# Patient Record
Sex: Female | Born: 1935
Health system: Southern US, Community
[De-identification: ages and names within clinical notes are randomized; demographics above are authoritative.]

## PROBLEM LIST (undated history)

## (undated) DIAGNOSIS — Z9981 Dependence on supplemental oxygen: Secondary | ICD-10-CM

## (undated) DIAGNOSIS — G4730 Sleep apnea, unspecified: Secondary | ICD-10-CM

## (undated) DIAGNOSIS — C801 Malignant (primary) neoplasm, unspecified: Secondary | ICD-10-CM

## (undated) DIAGNOSIS — I4891 Unspecified atrial fibrillation: Secondary | ICD-10-CM

## (undated) DIAGNOSIS — R0602 Shortness of breath: Secondary | ICD-10-CM

## (undated) DIAGNOSIS — R51 Headache: Secondary | ICD-10-CM

## (undated) DIAGNOSIS — Z5189 Encounter for other specified aftercare: Secondary | ICD-10-CM

## (undated) DIAGNOSIS — D649 Anemia, unspecified: Secondary | ICD-10-CM

## (undated) DIAGNOSIS — E039 Hypothyroidism, unspecified: Secondary | ICD-10-CM

## (undated) DIAGNOSIS — K219 Gastro-esophageal reflux disease without esophagitis: Secondary | ICD-10-CM

## (undated) DIAGNOSIS — E119 Type 2 diabetes mellitus without complications: Secondary | ICD-10-CM

## (undated) DIAGNOSIS — E785 Hyperlipidemia, unspecified: Secondary | ICD-10-CM

## (undated) DIAGNOSIS — T7840XA Allergy, unspecified, initial encounter: Secondary | ICD-10-CM

## (undated) DIAGNOSIS — R0981 Nasal congestion: Secondary | ICD-10-CM

## (undated) DIAGNOSIS — F419 Anxiety disorder, unspecified: Secondary | ICD-10-CM

## (undated) DIAGNOSIS — I10 Essential (primary) hypertension: Secondary | ICD-10-CM

## (undated) HISTORY — PX: THYROID EXPLORATION: SHX5280

## (undated) HISTORY — DX: Encounter for other specified aftercare: Z51.89

## (undated) HISTORY — DX: Anemia, unspecified: D64.9

## (undated) HISTORY — DX: Allergy, unspecified, initial encounter: T78.40XA

## (undated) HISTORY — PX: CHOLECYSTECTOMY: SHX55

## (undated) HISTORY — PX: OVARIAN CYST SURGERY: SHX726

## (undated) HISTORY — PX: TUBAL LIGATION: SHX77

## (undated) HISTORY — DX: Unspecified atrial fibrillation: I48.91

## (undated) HISTORY — PX: APPENDECTOMY: SHX54

## (undated) HISTORY — DX: Hyperlipidemia, unspecified: E78.5

---

## 1974-03-10 DIAGNOSIS — C801 Malignant (primary) neoplasm, unspecified: Secondary | ICD-10-CM

## 1974-03-10 HISTORY — DX: Malignant (primary) neoplasm, unspecified: C80.1

## 1974-03-10 HISTORY — PX: THYROIDECTOMY: SHX17

## 2011-05-15 DIAGNOSIS — I10 Essential (primary) hypertension: Secondary | ICD-10-CM | POA: Diagnosis not present

## 2011-05-15 DIAGNOSIS — E782 Mixed hyperlipidemia: Secondary | ICD-10-CM | POA: Diagnosis not present

## 2011-05-15 DIAGNOSIS — E119 Type 2 diabetes mellitus without complications: Secondary | ICD-10-CM | POA: Diagnosis not present

## 2011-06-09 DIAGNOSIS — J449 Chronic obstructive pulmonary disease, unspecified: Secondary | ICD-10-CM | POA: Diagnosis not present

## 2011-06-09 DIAGNOSIS — F17200 Nicotine dependence, unspecified, uncomplicated: Secondary | ICD-10-CM | POA: Diagnosis not present

## 2011-06-09 DIAGNOSIS — G4733 Obstructive sleep apnea (adult) (pediatric): Secondary | ICD-10-CM | POA: Diagnosis not present

## 2011-06-09 DIAGNOSIS — J309 Allergic rhinitis, unspecified: Secondary | ICD-10-CM | POA: Diagnosis not present

## 2011-06-09 DIAGNOSIS — I10 Essential (primary) hypertension: Secondary | ICD-10-CM | POA: Diagnosis not present

## 2011-06-09 DIAGNOSIS — L2089 Other atopic dermatitis: Secondary | ICD-10-CM | POA: Diagnosis not present

## 2011-06-09 DIAGNOSIS — E039 Hypothyroidism, unspecified: Secondary | ICD-10-CM | POA: Diagnosis not present

## 2011-06-09 DIAGNOSIS — E119 Type 2 diabetes mellitus without complications: Secondary | ICD-10-CM | POA: Diagnosis not present

## 2011-10-08 DIAGNOSIS — E119 Type 2 diabetes mellitus without complications: Secondary | ICD-10-CM | POA: Diagnosis not present

## 2011-10-08 DIAGNOSIS — I10 Essential (primary) hypertension: Secondary | ICD-10-CM | POA: Diagnosis not present

## 2011-10-08 DIAGNOSIS — E785 Hyperlipidemia, unspecified: Secondary | ICD-10-CM | POA: Diagnosis not present

## 2011-10-15 DIAGNOSIS — J449 Chronic obstructive pulmonary disease, unspecified: Secondary | ICD-10-CM | POA: Diagnosis not present

## 2011-10-15 DIAGNOSIS — E782 Mixed hyperlipidemia: Secondary | ICD-10-CM | POA: Diagnosis not present

## 2011-10-15 DIAGNOSIS — E039 Hypothyroidism, unspecified: Secondary | ICD-10-CM | POA: Diagnosis not present

## 2011-10-15 DIAGNOSIS — IMO0002 Reserved for concepts with insufficient information to code with codable children: Secondary | ICD-10-CM | POA: Diagnosis not present

## 2011-10-15 DIAGNOSIS — M1990 Unspecified osteoarthritis, unspecified site: Secondary | ICD-10-CM | POA: Diagnosis not present

## 2011-10-15 DIAGNOSIS — E669 Obesity, unspecified: Secondary | ICD-10-CM | POA: Diagnosis not present

## 2011-10-15 DIAGNOSIS — J309 Allergic rhinitis, unspecified: Secondary | ICD-10-CM | POA: Diagnosis not present

## 2011-10-15 DIAGNOSIS — I10 Essential (primary) hypertension: Secondary | ICD-10-CM | POA: Diagnosis not present

## 2011-12-08 DIAGNOSIS — Z1231 Encounter for screening mammogram for malignant neoplasm of breast: Secondary | ICD-10-CM | POA: Diagnosis not present

## 2011-12-15 DIAGNOSIS — IMO0001 Reserved for inherently not codable concepts without codable children: Secondary | ICD-10-CM | POA: Diagnosis not present

## 2011-12-15 DIAGNOSIS — R197 Diarrhea, unspecified: Secondary | ICD-10-CM | POA: Diagnosis not present

## 2012-01-15 DIAGNOSIS — Z23 Encounter for immunization: Secondary | ICD-10-CM | POA: Diagnosis not present

## 2012-01-15 DIAGNOSIS — E7800 Pure hypercholesterolemia, unspecified: Secondary | ICD-10-CM | POA: Diagnosis not present

## 2012-01-15 DIAGNOSIS — I10 Essential (primary) hypertension: Secondary | ICD-10-CM | POA: Diagnosis not present

## 2012-01-15 DIAGNOSIS — IMO0001 Reserved for inherently not codable concepts without codable children: Secondary | ICD-10-CM | POA: Diagnosis not present

## 2012-01-15 DIAGNOSIS — E782 Mixed hyperlipidemia: Secondary | ICD-10-CM | POA: Diagnosis not present

## 2012-02-10 DIAGNOSIS — I10 Essential (primary) hypertension: Secondary | ICD-10-CM | POA: Diagnosis not present

## 2012-02-10 DIAGNOSIS — J309 Allergic rhinitis, unspecified: Secondary | ICD-10-CM | POA: Diagnosis not present

## 2012-02-10 DIAGNOSIS — E669 Obesity, unspecified: Secondary | ICD-10-CM | POA: Diagnosis not present

## 2012-02-10 DIAGNOSIS — E039 Hypothyroidism, unspecified: Secondary | ICD-10-CM | POA: Diagnosis not present

## 2012-02-10 DIAGNOSIS — M1990 Unspecified osteoarthritis, unspecified site: Secondary | ICD-10-CM | POA: Diagnosis not present

## 2012-02-10 DIAGNOSIS — E782 Mixed hyperlipidemia: Secondary | ICD-10-CM | POA: Diagnosis not present

## 2012-02-10 DIAGNOSIS — J449 Chronic obstructive pulmonary disease, unspecified: Secondary | ICD-10-CM | POA: Diagnosis not present

## 2012-02-10 DIAGNOSIS — IMO0002 Reserved for concepts with insufficient information to code with codable children: Secondary | ICD-10-CM | POA: Diagnosis not present

## 2012-05-12 DIAGNOSIS — E782 Mixed hyperlipidemia: Secondary | ICD-10-CM | POA: Diagnosis not present

## 2012-05-12 DIAGNOSIS — IMO0002 Reserved for concepts with insufficient information to code with codable children: Secondary | ICD-10-CM | POA: Diagnosis not present

## 2012-05-12 DIAGNOSIS — I10 Essential (primary) hypertension: Secondary | ICD-10-CM | POA: Diagnosis not present

## 2012-05-12 DIAGNOSIS — E119 Type 2 diabetes mellitus without complications: Secondary | ICD-10-CM | POA: Diagnosis not present

## 2012-07-05 DIAGNOSIS — R21 Rash and other nonspecific skin eruption: Secondary | ICD-10-CM | POA: Diagnosis not present

## 2012-07-06 DIAGNOSIS — E782 Mixed hyperlipidemia: Secondary | ICD-10-CM | POA: Diagnosis not present

## 2012-07-06 DIAGNOSIS — E039 Hypothyroidism, unspecified: Secondary | ICD-10-CM | POA: Diagnosis not present

## 2012-07-06 DIAGNOSIS — E119 Type 2 diabetes mellitus without complications: Secondary | ICD-10-CM | POA: Diagnosis not present

## 2012-07-12 DIAGNOSIS — E782 Mixed hyperlipidemia: Secondary | ICD-10-CM | POA: Diagnosis not present

## 2012-07-12 DIAGNOSIS — I10 Essential (primary) hypertension: Secondary | ICD-10-CM | POA: Diagnosis not present

## 2012-07-12 DIAGNOSIS — J309 Allergic rhinitis, unspecified: Secondary | ICD-10-CM | POA: Diagnosis not present

## 2012-07-12 DIAGNOSIS — Z0000 Encounter for general adult medical examination without abnormal findings: Secondary | ICD-10-CM | POA: Diagnosis not present

## 2012-07-12 DIAGNOSIS — J449 Chronic obstructive pulmonary disease, unspecified: Secondary | ICD-10-CM | POA: Diagnosis not present

## 2012-07-12 DIAGNOSIS — E039 Hypothyroidism, unspecified: Secondary | ICD-10-CM | POA: Diagnosis not present

## 2012-07-12 DIAGNOSIS — IMO0002 Reserved for concepts with insufficient information to code with codable children: Secondary | ICD-10-CM | POA: Diagnosis not present

## 2012-07-12 DIAGNOSIS — M1990 Unspecified osteoarthritis, unspecified site: Secondary | ICD-10-CM | POA: Diagnosis not present

## 2012-07-30 DIAGNOSIS — R21 Rash and other nonspecific skin eruption: Secondary | ICD-10-CM | POA: Diagnosis not present

## 2012-07-30 DIAGNOSIS — T148 Other injury of unspecified body region: Secondary | ICD-10-CM | POA: Diagnosis not present

## 2012-08-14 DIAGNOSIS — M5430 Sciatica, unspecified side: Secondary | ICD-10-CM | POA: Diagnosis not present

## 2012-08-19 DIAGNOSIS — L408 Other psoriasis: Secondary | ICD-10-CM | POA: Diagnosis not present

## 2012-08-19 DIAGNOSIS — R21 Rash and other nonspecific skin eruption: Secondary | ICD-10-CM | POA: Diagnosis not present

## 2012-08-19 DIAGNOSIS — L259 Unspecified contact dermatitis, unspecified cause: Secondary | ICD-10-CM | POA: Diagnosis not present

## 2012-08-27 DIAGNOSIS — IMO0002 Reserved for concepts with insufficient information to code with codable children: Secondary | ICD-10-CM | POA: Diagnosis not present

## 2012-08-27 DIAGNOSIS — R21 Rash and other nonspecific skin eruption: Secondary | ICD-10-CM | POA: Diagnosis not present

## 2012-09-01 DIAGNOSIS — IMO0002 Reserved for concepts with insufficient information to code with codable children: Secondary | ICD-10-CM | POA: Diagnosis not present

## 2012-09-01 DIAGNOSIS — R21 Rash and other nonspecific skin eruption: Secondary | ICD-10-CM | POA: Diagnosis not present

## 2012-11-23 DIAGNOSIS — E782 Mixed hyperlipidemia: Secondary | ICD-10-CM | POA: Diagnosis not present

## 2012-11-23 DIAGNOSIS — E119 Type 2 diabetes mellitus without complications: Secondary | ICD-10-CM | POA: Diagnosis not present

## 2012-11-23 DIAGNOSIS — F17200 Nicotine dependence, unspecified, uncomplicated: Secondary | ICD-10-CM | POA: Diagnosis not present

## 2012-11-23 DIAGNOSIS — E039 Hypothyroidism, unspecified: Secondary | ICD-10-CM | POA: Diagnosis not present

## 2012-11-23 DIAGNOSIS — E669 Obesity, unspecified: Secondary | ICD-10-CM | POA: Diagnosis not present

## 2012-11-23 DIAGNOSIS — I10 Essential (primary) hypertension: Secondary | ICD-10-CM | POA: Diagnosis not present

## 2012-11-23 DIAGNOSIS — M1990 Unspecified osteoarthritis, unspecified site: Secondary | ICD-10-CM | POA: Diagnosis not present

## 2012-11-29 DIAGNOSIS — E039 Hypothyroidism, unspecified: Secondary | ICD-10-CM | POA: Diagnosis not present

## 2012-11-29 DIAGNOSIS — E669 Obesity, unspecified: Secondary | ICD-10-CM | POA: Diagnosis not present

## 2012-11-29 DIAGNOSIS — I10 Essential (primary) hypertension: Secondary | ICD-10-CM | POA: Diagnosis not present

## 2012-11-29 DIAGNOSIS — J309 Allergic rhinitis, unspecified: Secondary | ICD-10-CM | POA: Diagnosis not present

## 2012-11-29 DIAGNOSIS — Z23 Encounter for immunization: Secondary | ICD-10-CM | POA: Diagnosis not present

## 2012-11-29 DIAGNOSIS — J449 Chronic obstructive pulmonary disease, unspecified: Secondary | ICD-10-CM | POA: Diagnosis not present

## 2012-11-29 DIAGNOSIS — IMO0002 Reserved for concepts with insufficient information to code with codable children: Secondary | ICD-10-CM | POA: Diagnosis not present

## 2012-11-29 DIAGNOSIS — E782 Mixed hyperlipidemia: Secondary | ICD-10-CM | POA: Diagnosis not present

## 2012-12-01 DIAGNOSIS — D485 Neoplasm of uncertain behavior of skin: Secondary | ICD-10-CM | POA: Diagnosis not present

## 2012-12-01 DIAGNOSIS — L570 Actinic keratosis: Secondary | ICD-10-CM | POA: Diagnosis not present

## 2012-12-01 DIAGNOSIS — L259 Unspecified contact dermatitis, unspecified cause: Secondary | ICD-10-CM | POA: Diagnosis not present

## 2012-12-04 DIAGNOSIS — Z87891 Personal history of nicotine dependence: Secondary | ICD-10-CM | POA: Diagnosis not present

## 2012-12-04 DIAGNOSIS — E119 Type 2 diabetes mellitus without complications: Secondary | ICD-10-CM | POA: Diagnosis not present

## 2012-12-04 DIAGNOSIS — Z7982 Long term (current) use of aspirin: Secondary | ICD-10-CM | POA: Diagnosis not present

## 2012-12-04 DIAGNOSIS — R5381 Other malaise: Secondary | ICD-10-CM | POA: Diagnosis not present

## 2012-12-04 DIAGNOSIS — Z79899 Other long term (current) drug therapy: Secondary | ICD-10-CM | POA: Diagnosis not present

## 2012-12-04 DIAGNOSIS — Z8249 Family history of ischemic heart disease and other diseases of the circulatory system: Secondary | ICD-10-CM | POA: Diagnosis not present

## 2012-12-04 DIAGNOSIS — R42 Dizziness and giddiness: Secondary | ICD-10-CM | POA: Diagnosis not present

## 2012-12-04 DIAGNOSIS — I10 Essential (primary) hypertension: Secondary | ICD-10-CM | POA: Diagnosis not present

## 2012-12-06 DIAGNOSIS — H81399 Other peripheral vertigo, unspecified ear: Secondary | ICD-10-CM | POA: Diagnosis not present

## 2012-12-06 DIAGNOSIS — R42 Dizziness and giddiness: Secondary | ICD-10-CM | POA: Diagnosis not present

## 2012-12-15 DIAGNOSIS — L259 Unspecified contact dermatitis, unspecified cause: Secondary | ICD-10-CM | POA: Diagnosis not present

## 2012-12-15 DIAGNOSIS — L570 Actinic keratosis: Secondary | ICD-10-CM | POA: Diagnosis not present

## 2012-12-17 DIAGNOSIS — E119 Type 2 diabetes mellitus without complications: Secondary | ICD-10-CM | POA: Diagnosis not present

## 2012-12-20 DIAGNOSIS — IMO0001 Reserved for inherently not codable concepts without codable children: Secondary | ICD-10-CM | POA: Diagnosis not present

## 2012-12-20 DIAGNOSIS — M6281 Muscle weakness (generalized): Secondary | ICD-10-CM | POA: Diagnosis not present

## 2012-12-20 DIAGNOSIS — R269 Unspecified abnormalities of gait and mobility: Secondary | ICD-10-CM | POA: Diagnosis not present

## 2012-12-20 DIAGNOSIS — R42 Dizziness and giddiness: Secondary | ICD-10-CM | POA: Diagnosis not present

## 2012-12-22 DIAGNOSIS — Z1231 Encounter for screening mammogram for malignant neoplasm of breast: Secondary | ICD-10-CM | POA: Diagnosis not present

## 2012-12-22 DIAGNOSIS — IMO0001 Reserved for inherently not codable concepts without codable children: Secondary | ICD-10-CM | POA: Diagnosis not present

## 2012-12-22 DIAGNOSIS — M6281 Muscle weakness (generalized): Secondary | ICD-10-CM | POA: Diagnosis not present

## 2012-12-22 DIAGNOSIS — R269 Unspecified abnormalities of gait and mobility: Secondary | ICD-10-CM | POA: Diagnosis not present

## 2012-12-22 DIAGNOSIS — R42 Dizziness and giddiness: Secondary | ICD-10-CM | POA: Diagnosis not present

## 2012-12-24 DIAGNOSIS — M6281 Muscle weakness (generalized): Secondary | ICD-10-CM | POA: Diagnosis not present

## 2012-12-24 DIAGNOSIS — IMO0001 Reserved for inherently not codable concepts without codable children: Secondary | ICD-10-CM | POA: Diagnosis not present

## 2012-12-24 DIAGNOSIS — R269 Unspecified abnormalities of gait and mobility: Secondary | ICD-10-CM | POA: Diagnosis not present

## 2012-12-24 DIAGNOSIS — R42 Dizziness and giddiness: Secondary | ICD-10-CM | POA: Diagnosis not present

## 2012-12-28 DIAGNOSIS — R42 Dizziness and giddiness: Secondary | ICD-10-CM | POA: Diagnosis not present

## 2012-12-28 DIAGNOSIS — R269 Unspecified abnormalities of gait and mobility: Secondary | ICD-10-CM | POA: Diagnosis not present

## 2012-12-28 DIAGNOSIS — IMO0001 Reserved for inherently not codable concepts without codable children: Secondary | ICD-10-CM | POA: Diagnosis not present

## 2012-12-28 DIAGNOSIS — M6281 Muscle weakness (generalized): Secondary | ICD-10-CM | POA: Diagnosis not present

## 2012-12-30 DIAGNOSIS — R269 Unspecified abnormalities of gait and mobility: Secondary | ICD-10-CM | POA: Diagnosis not present

## 2012-12-30 DIAGNOSIS — IMO0001 Reserved for inherently not codable concepts without codable children: Secondary | ICD-10-CM | POA: Diagnosis not present

## 2012-12-30 DIAGNOSIS — M6281 Muscle weakness (generalized): Secondary | ICD-10-CM | POA: Diagnosis not present

## 2012-12-30 DIAGNOSIS — R42 Dizziness and giddiness: Secondary | ICD-10-CM | POA: Diagnosis not present

## 2013-01-04 DIAGNOSIS — IMO0001 Reserved for inherently not codable concepts without codable children: Secondary | ICD-10-CM | POA: Diagnosis not present

## 2013-01-04 DIAGNOSIS — M6281 Muscle weakness (generalized): Secondary | ICD-10-CM | POA: Diagnosis not present

## 2013-01-04 DIAGNOSIS — R42 Dizziness and giddiness: Secondary | ICD-10-CM | POA: Diagnosis not present

## 2013-01-04 DIAGNOSIS — R269 Unspecified abnormalities of gait and mobility: Secondary | ICD-10-CM | POA: Diagnosis not present

## 2013-01-06 DIAGNOSIS — IMO0001 Reserved for inherently not codable concepts without codable children: Secondary | ICD-10-CM | POA: Diagnosis not present

## 2013-01-06 DIAGNOSIS — M6281 Muscle weakness (generalized): Secondary | ICD-10-CM | POA: Diagnosis not present

## 2013-01-06 DIAGNOSIS — R42 Dizziness and giddiness: Secondary | ICD-10-CM | POA: Diagnosis not present

## 2013-01-06 DIAGNOSIS — R269 Unspecified abnormalities of gait and mobility: Secondary | ICD-10-CM | POA: Diagnosis not present

## 2013-01-13 DIAGNOSIS — M6281 Muscle weakness (generalized): Secondary | ICD-10-CM | POA: Diagnosis not present

## 2013-01-13 DIAGNOSIS — R269 Unspecified abnormalities of gait and mobility: Secondary | ICD-10-CM | POA: Diagnosis not present

## 2013-01-13 DIAGNOSIS — IMO0001 Reserved for inherently not codable concepts without codable children: Secondary | ICD-10-CM | POA: Diagnosis not present

## 2013-01-13 DIAGNOSIS — R42 Dizziness and giddiness: Secondary | ICD-10-CM | POA: Diagnosis not present

## 2013-02-17 DIAGNOSIS — M899 Disorder of bone, unspecified: Secondary | ICD-10-CM | POA: Diagnosis not present

## 2013-02-17 DIAGNOSIS — Z79899 Other long term (current) drug therapy: Secondary | ICD-10-CM | POA: Diagnosis not present

## 2013-02-17 DIAGNOSIS — Z780 Asymptomatic menopausal state: Secondary | ICD-10-CM | POA: Diagnosis not present

## 2013-02-17 DIAGNOSIS — Z85850 Personal history of malignant neoplasm of thyroid: Secondary | ICD-10-CM | POA: Diagnosis not present

## 2013-02-17 DIAGNOSIS — Z7982 Long term (current) use of aspirin: Secondary | ICD-10-CM | POA: Diagnosis not present

## 2013-02-17 DIAGNOSIS — Z8262 Family history of osteoporosis: Secondary | ICD-10-CM | POA: Diagnosis not present

## 2013-02-17 DIAGNOSIS — M810 Age-related osteoporosis without current pathological fracture: Secondary | ICD-10-CM | POA: Diagnosis not present

## 2013-02-21 DIAGNOSIS — I10 Essential (primary) hypertension: Secondary | ICD-10-CM | POA: Diagnosis not present

## 2013-02-21 DIAGNOSIS — F17200 Nicotine dependence, unspecified, uncomplicated: Secondary | ICD-10-CM | POA: Diagnosis not present

## 2013-02-21 DIAGNOSIS — N183 Chronic kidney disease, stage 3 unspecified: Secondary | ICD-10-CM | POA: Diagnosis not present

## 2013-02-21 DIAGNOSIS — E669 Obesity, unspecified: Secondary | ICD-10-CM | POA: Diagnosis not present

## 2013-02-21 DIAGNOSIS — E119 Type 2 diabetes mellitus without complications: Secondary | ICD-10-CM | POA: Diagnosis not present

## 2013-02-21 DIAGNOSIS — E782 Mixed hyperlipidemia: Secondary | ICD-10-CM | POA: Diagnosis not present

## 2013-02-21 DIAGNOSIS — E039 Hypothyroidism, unspecified: Secondary | ICD-10-CM | POA: Diagnosis not present

## 2013-02-28 DIAGNOSIS — Z1331 Encounter for screening for depression: Secondary | ICD-10-CM | POA: Diagnosis not present

## 2013-02-28 DIAGNOSIS — J309 Allergic rhinitis, unspecified: Secondary | ICD-10-CM | POA: Diagnosis not present

## 2013-02-28 DIAGNOSIS — E039 Hypothyroidism, unspecified: Secondary | ICD-10-CM | POA: Diagnosis not present

## 2013-02-28 DIAGNOSIS — J449 Chronic obstructive pulmonary disease, unspecified: Secondary | ICD-10-CM | POA: Diagnosis not present

## 2013-02-28 DIAGNOSIS — IMO0002 Reserved for concepts with insufficient information to code with codable children: Secondary | ICD-10-CM | POA: Diagnosis not present

## 2013-02-28 DIAGNOSIS — E669 Obesity, unspecified: Secondary | ICD-10-CM | POA: Diagnosis not present

## 2013-02-28 DIAGNOSIS — E782 Mixed hyperlipidemia: Secondary | ICD-10-CM | POA: Diagnosis not present

## 2013-02-28 DIAGNOSIS — I10 Essential (primary) hypertension: Secondary | ICD-10-CM | POA: Diagnosis not present

## 2013-04-05 DIAGNOSIS — E039 Hypothyroidism, unspecified: Secondary | ICD-10-CM | POA: Diagnosis present

## 2013-04-05 DIAGNOSIS — J9600 Acute respiratory failure, unspecified whether with hypoxia or hypercapnia: Secondary | ICD-10-CM | POA: Diagnosis not present

## 2013-04-05 DIAGNOSIS — Z5189 Encounter for other specified aftercare: Secondary | ICD-10-CM | POA: Diagnosis not present

## 2013-04-05 DIAGNOSIS — F4489 Other dissociative and conversion disorders: Secondary | ICD-10-CM | POA: Diagnosis not present

## 2013-04-05 DIAGNOSIS — R948 Abnormal results of function studies of other organs and systems: Secondary | ICD-10-CM | POA: Diagnosis present

## 2013-04-05 DIAGNOSIS — Z79899 Other long term (current) drug therapy: Secondary | ICD-10-CM | POA: Diagnosis not present

## 2013-04-05 DIAGNOSIS — R269 Unspecified abnormalities of gait and mobility: Secondary | ICD-10-CM | POA: Diagnosis not present

## 2013-04-05 DIAGNOSIS — L408 Other psoriasis: Secondary | ICD-10-CM | POA: Diagnosis present

## 2013-04-05 DIAGNOSIS — I700 Atherosclerosis of aorta: Secondary | ICD-10-CM | POA: Diagnosis not present

## 2013-04-05 DIAGNOSIS — R911 Solitary pulmonary nodule: Secondary | ICD-10-CM | POA: Diagnosis present

## 2013-04-05 DIAGNOSIS — Z87891 Personal history of nicotine dependence: Secondary | ICD-10-CM | POA: Diagnosis not present

## 2013-04-05 DIAGNOSIS — R0902 Hypoxemia: Secondary | ICD-10-CM | POA: Diagnosis not present

## 2013-04-05 DIAGNOSIS — R293 Abnormal posture: Secondary | ICD-10-CM | POA: Diagnosis not present

## 2013-04-05 DIAGNOSIS — R799 Abnormal finding of blood chemistry, unspecified: Secondary | ICD-10-CM | POA: Diagnosis present

## 2013-04-05 DIAGNOSIS — R0989 Other specified symptoms and signs involving the circulatory and respiratory systems: Secondary | ICD-10-CM | POA: Diagnosis not present

## 2013-04-05 DIAGNOSIS — E041 Nontoxic single thyroid nodule: Secondary | ICD-10-CM | POA: Diagnosis present

## 2013-04-05 DIAGNOSIS — Z7982 Long term (current) use of aspirin: Secondary | ICD-10-CM | POA: Diagnosis not present

## 2013-04-05 DIAGNOSIS — I6789 Other cerebrovascular disease: Secondary | ICD-10-CM | POA: Diagnosis not present

## 2013-04-05 DIAGNOSIS — D72829 Elevated white blood cell count, unspecified: Secondary | ICD-10-CM | POA: Diagnosis not present

## 2013-04-05 DIAGNOSIS — E119 Type 2 diabetes mellitus without complications: Secondary | ICD-10-CM | POA: Diagnosis present

## 2013-04-05 DIAGNOSIS — D649 Anemia, unspecified: Secondary | ICD-10-CM | POA: Diagnosis not present

## 2013-04-05 DIAGNOSIS — R4182 Altered mental status, unspecified: Secondary | ICD-10-CM | POA: Diagnosis not present

## 2013-04-05 DIAGNOSIS — E871 Hypo-osmolality and hyponatremia: Secondary | ICD-10-CM | POA: Diagnosis not present

## 2013-04-05 DIAGNOSIS — A4151 Sepsis due to Escherichia coli [E. coli]: Secondary | ICD-10-CM | POA: Diagnosis not present

## 2013-04-05 DIAGNOSIS — G4733 Obstructive sleep apnea (adult) (pediatric): Secondary | ICD-10-CM | POA: Diagnosis present

## 2013-04-05 DIAGNOSIS — R6520 Severe sepsis without septic shock: Secondary | ICD-10-CM | POA: Diagnosis present

## 2013-04-05 DIAGNOSIS — Z6831 Body mass index (BMI) 31.0-31.9, adult: Secondary | ICD-10-CM | POA: Diagnosis not present

## 2013-04-05 DIAGNOSIS — J449 Chronic obstructive pulmonary disease, unspecified: Secondary | ICD-10-CM | POA: Diagnosis present

## 2013-04-05 DIAGNOSIS — N390 Urinary tract infection, site not specified: Secondary | ICD-10-CM | POA: Diagnosis not present

## 2013-04-05 DIAGNOSIS — I10 Essential (primary) hypertension: Secondary | ICD-10-CM | POA: Diagnosis present

## 2013-04-05 DIAGNOSIS — E876 Hypokalemia: Secondary | ICD-10-CM | POA: Diagnosis present

## 2013-04-05 DIAGNOSIS — M6281 Muscle weakness (generalized): Secondary | ICD-10-CM | POA: Diagnosis not present

## 2013-04-05 DIAGNOSIS — R279 Unspecified lack of coordination: Secondary | ICD-10-CM | POA: Diagnosis not present

## 2013-04-05 DIAGNOSIS — A419 Sepsis, unspecified organism: Secondary | ICD-10-CM | POA: Diagnosis not present

## 2013-04-05 DIAGNOSIS — N10 Acute tubulo-interstitial nephritis: Secondary | ICD-10-CM | POA: Diagnosis not present

## 2013-04-05 DIAGNOSIS — R110 Nausea: Secondary | ICD-10-CM | POA: Diagnosis not present

## 2013-04-05 DIAGNOSIS — N179 Acute kidney failure, unspecified: Secondary | ICD-10-CM | POA: Diagnosis present

## 2013-04-11 DIAGNOSIS — A419 Sepsis, unspecified organism: Secondary | ICD-10-CM | POA: Diagnosis not present

## 2013-04-11 DIAGNOSIS — M6281 Muscle weakness (generalized): Secondary | ICD-10-CM | POA: Diagnosis not present

## 2013-04-11 DIAGNOSIS — E039 Hypothyroidism, unspecified: Secondary | ICD-10-CM | POA: Diagnosis not present

## 2013-04-11 DIAGNOSIS — R293 Abnormal posture: Secondary | ICD-10-CM | POA: Diagnosis not present

## 2013-04-11 DIAGNOSIS — G4733 Obstructive sleep apnea (adult) (pediatric): Secondary | ICD-10-CM | POA: Diagnosis not present

## 2013-04-11 DIAGNOSIS — R269 Unspecified abnormalities of gait and mobility: Secondary | ICD-10-CM | POA: Diagnosis not present

## 2013-04-11 DIAGNOSIS — R4182 Altered mental status, unspecified: Secondary | ICD-10-CM | POA: Diagnosis not present

## 2013-04-11 DIAGNOSIS — Z5189 Encounter for other specified aftercare: Secondary | ICD-10-CM | POA: Diagnosis not present

## 2013-04-11 DIAGNOSIS — J449 Chronic obstructive pulmonary disease, unspecified: Secondary | ICD-10-CM | POA: Diagnosis not present

## 2013-04-11 DIAGNOSIS — I10 Essential (primary) hypertension: Secondary | ICD-10-CM | POA: Diagnosis not present

## 2013-04-11 DIAGNOSIS — N10 Acute tubulo-interstitial nephritis: Secondary | ICD-10-CM | POA: Diagnosis not present

## 2013-04-11 DIAGNOSIS — E119 Type 2 diabetes mellitus without complications: Secondary | ICD-10-CM | POA: Diagnosis not present

## 2013-04-11 DIAGNOSIS — J9600 Acute respiratory failure, unspecified whether with hypoxia or hypercapnia: Secondary | ICD-10-CM | POA: Diagnosis not present

## 2013-04-11 DIAGNOSIS — D72829 Elevated white blood cell count, unspecified: Secondary | ICD-10-CM | POA: Diagnosis not present

## 2013-04-11 DIAGNOSIS — R279 Unspecified lack of coordination: Secondary | ICD-10-CM | POA: Diagnosis not present

## 2013-04-11 DIAGNOSIS — A4151 Sepsis due to Escherichia coli [E. coli]: Secondary | ICD-10-CM | POA: Diagnosis not present

## 2013-04-20 DIAGNOSIS — G4733 Obstructive sleep apnea (adult) (pediatric): Secondary | ICD-10-CM | POA: Diagnosis not present

## 2013-05-12 DIAGNOSIS — E042 Nontoxic multinodular goiter: Secondary | ICD-10-CM | POA: Diagnosis not present

## 2013-05-25 DIAGNOSIS — J449 Chronic obstructive pulmonary disease, unspecified: Secondary | ICD-10-CM | POA: Diagnosis not present

## 2013-05-25 DIAGNOSIS — E669 Obesity, unspecified: Secondary | ICD-10-CM | POA: Diagnosis not present

## 2013-05-25 DIAGNOSIS — I10 Essential (primary) hypertension: Secondary | ICD-10-CM | POA: Diagnosis not present

## 2013-05-25 DIAGNOSIS — IMO0002 Reserved for concepts with insufficient information to code with codable children: Secondary | ICD-10-CM | POA: Diagnosis not present

## 2013-05-25 DIAGNOSIS — J309 Allergic rhinitis, unspecified: Secondary | ICD-10-CM | POA: Diagnosis not present

## 2013-05-25 DIAGNOSIS — E039 Hypothyroidism, unspecified: Secondary | ICD-10-CM | POA: Diagnosis not present

## 2013-05-25 DIAGNOSIS — A419 Sepsis, unspecified organism: Secondary | ICD-10-CM | POA: Diagnosis not present

## 2013-05-25 DIAGNOSIS — E782 Mixed hyperlipidemia: Secondary | ICD-10-CM | POA: Diagnosis not present

## 2013-07-04 DIAGNOSIS — E782 Mixed hyperlipidemia: Secondary | ICD-10-CM | POA: Diagnosis not present

## 2013-07-04 DIAGNOSIS — E119 Type 2 diabetes mellitus without complications: Secondary | ICD-10-CM | POA: Diagnosis not present

## 2013-07-04 DIAGNOSIS — E039 Hypothyroidism, unspecified: Secondary | ICD-10-CM | POA: Diagnosis not present

## 2013-07-11 DIAGNOSIS — IMO0002 Reserved for concepts with insufficient information to code with codable children: Secondary | ICD-10-CM | POA: Diagnosis not present

## 2013-07-11 DIAGNOSIS — H6120 Impacted cerumen, unspecified ear: Secondary | ICD-10-CM | POA: Diagnosis not present

## 2013-07-11 DIAGNOSIS — I10 Essential (primary) hypertension: Secondary | ICD-10-CM | POA: Diagnosis not present

## 2013-07-11 DIAGNOSIS — J449 Chronic obstructive pulmonary disease, unspecified: Secondary | ICD-10-CM | POA: Diagnosis not present

## 2013-07-11 DIAGNOSIS — E669 Obesity, unspecified: Secondary | ICD-10-CM | POA: Diagnosis not present

## 2013-07-11 DIAGNOSIS — J309 Allergic rhinitis, unspecified: Secondary | ICD-10-CM | POA: Diagnosis not present

## 2013-07-11 DIAGNOSIS — E782 Mixed hyperlipidemia: Secondary | ICD-10-CM | POA: Diagnosis not present

## 2013-07-11 DIAGNOSIS — E039 Hypothyroidism, unspecified: Secondary | ICD-10-CM | POA: Diagnosis not present

## 2013-10-24 DIAGNOSIS — S6990XA Unspecified injury of unspecified wrist, hand and finger(s), initial encounter: Secondary | ICD-10-CM | POA: Diagnosis not present

## 2013-10-24 DIAGNOSIS — M79609 Pain in unspecified limb: Secondary | ICD-10-CM | POA: Diagnosis not present

## 2013-10-24 DIAGNOSIS — S9306XA Dislocation of unspecified ankle joint, initial encounter: Secondary | ICD-10-CM | POA: Diagnosis not present

## 2013-10-24 DIAGNOSIS — S82409A Unspecified fracture of shaft of unspecified fibula, initial encounter for closed fracture: Secondary | ICD-10-CM | POA: Diagnosis not present

## 2013-10-24 DIAGNOSIS — S82899A Other fracture of unspecified lower leg, initial encounter for closed fracture: Secondary | ICD-10-CM | POA: Diagnosis not present

## 2013-10-24 DIAGNOSIS — Z23 Encounter for immunization: Secondary | ICD-10-CM | POA: Diagnosis not present

## 2013-10-24 DIAGNOSIS — S82209A Unspecified fracture of shaft of unspecified tibia, initial encounter for closed fracture: Secondary | ICD-10-CM | POA: Diagnosis not present

## 2013-10-24 DIAGNOSIS — S93409A Sprain of unspecified ligament of unspecified ankle, initial encounter: Secondary | ICD-10-CM | POA: Diagnosis not present

## 2013-10-24 DIAGNOSIS — S8253XA Displaced fracture of medial malleolus of unspecified tibia, initial encounter for closed fracture: Secondary | ICD-10-CM | POA: Diagnosis not present

## 2013-10-24 DIAGNOSIS — S82853A Displaced trimalleolar fracture of unspecified lower leg, initial encounter for closed fracture: Secondary | ICD-10-CM | POA: Diagnosis not present

## 2013-10-24 DIAGNOSIS — M7989 Other specified soft tissue disorders: Secondary | ICD-10-CM | POA: Diagnosis not present

## 2013-10-25 DIAGNOSIS — S9306XA Dislocation of unspecified ankle joint, initial encounter: Secondary | ICD-10-CM | POA: Diagnosis not present

## 2013-10-25 DIAGNOSIS — S82899A Other fracture of unspecified lower leg, initial encounter for closed fracture: Secondary | ICD-10-CM | POA: Diagnosis not present

## 2013-10-31 ENCOUNTER — Other Ambulatory Visit: Payer: Self-pay | Admitting: Orthopedic Surgery

## 2013-10-31 ENCOUNTER — Ambulatory Visit
Admission: RE | Admit: 2013-10-31 | Discharge: 2013-10-31 | Disposition: A | Discharge status: Home / Self Care | Payer: Medicare Other | Source: Ambulatory Visit | Attending: Orthopedic Surgery | Admitting: Orthopedic Surgery

## 2013-10-31 DIAGNOSIS — S8263XA Displaced fracture of lateral malleolus of unspecified fibula, initial encounter for closed fracture: Secondary | ICD-10-CM | POA: Diagnosis not present

## 2013-10-31 DIAGNOSIS — S82871A Displaced pilon fracture of right tibia, initial encounter for closed fracture: Secondary | ICD-10-CM

## 2013-10-31 DIAGNOSIS — S82853A Displaced trimalleolar fracture of unspecified lower leg, initial encounter for closed fracture: Secondary | ICD-10-CM | POA: Diagnosis not present

## 2013-10-31 DIAGNOSIS — S8253XA Displaced fracture of medial malleolus of unspecified tibia, initial encounter for closed fracture: Secondary | ICD-10-CM | POA: Diagnosis not present

## 2013-11-01 ENCOUNTER — Encounter (HOSPITAL_COMMUNITY): Payer: Self-pay

## 2013-11-02 ENCOUNTER — Encounter (HOSPITAL_COMMUNITY)
Admission: RE | Admit: 2013-11-02 | Discharge: 2013-11-02 | Disposition: A | Discharge status: Home / Self Care | Payer: Medicare Other | Source: Ambulatory Visit | Attending: Anesthesiology | Admitting: Anesthesiology

## 2013-11-02 ENCOUNTER — Encounter (HOSPITAL_COMMUNITY)
Admission: RE | Admit: 2013-11-02 | Discharge: 2013-11-02 | Disposition: A | Discharge status: Home / Self Care | Payer: Medicare Other | Source: Ambulatory Visit | Attending: Orthopedic Surgery | Admitting: Orthopedic Surgery

## 2013-11-02 ENCOUNTER — Encounter (HOSPITAL_COMMUNITY): Payer: Self-pay

## 2013-11-02 ENCOUNTER — Other Ambulatory Visit: Payer: Self-pay | Admitting: Physician Assistant

## 2013-11-02 DIAGNOSIS — G4730 Sleep apnea, unspecified: Secondary | ICD-10-CM | POA: Diagnosis present

## 2013-11-02 DIAGNOSIS — S82839B Other fracture of upper and lower end of unspecified fibula, initial encounter for open fracture type I or II: Secondary | ICD-10-CM | POA: Diagnosis not present

## 2013-11-02 DIAGNOSIS — E119 Type 2 diabetes mellitus without complications: Secondary | ICD-10-CM | POA: Diagnosis present

## 2013-11-02 DIAGNOSIS — G8918 Other acute postprocedural pain: Secondary | ICD-10-CM | POA: Diagnosis not present

## 2013-11-02 DIAGNOSIS — M25559 Pain in unspecified hip: Secondary | ICD-10-CM | POA: Diagnosis not present

## 2013-11-02 DIAGNOSIS — J9819 Other pulmonary collapse: Secondary | ICD-10-CM | POA: Diagnosis not present

## 2013-11-02 DIAGNOSIS — R51 Headache: Secondary | ICD-10-CM | POA: Diagnosis not present

## 2013-11-02 DIAGNOSIS — M25579 Pain in unspecified ankle and joints of unspecified foot: Secondary | ICD-10-CM | POA: Diagnosis not present

## 2013-11-02 DIAGNOSIS — IMO0002 Reserved for concepts with insufficient information to code with codable children: Secondary | ICD-10-CM | POA: Diagnosis not present

## 2013-11-02 DIAGNOSIS — S82209A Unspecified fracture of shaft of unspecified tibia, initial encounter for closed fracture: Secondary | ICD-10-CM | POA: Diagnosis not present

## 2013-11-02 DIAGNOSIS — Z85850 Personal history of malignant neoplasm of thyroid: Secondary | ICD-10-CM | POA: Diagnosis not present

## 2013-11-02 DIAGNOSIS — W19XXXA Unspecified fall, initial encounter: Secondary | ICD-10-CM | POA: Diagnosis present

## 2013-11-02 DIAGNOSIS — Z9181 History of falling: Secondary | ICD-10-CM | POA: Diagnosis not present

## 2013-11-02 DIAGNOSIS — R269 Unspecified abnormalities of gait and mobility: Secondary | ICD-10-CM | POA: Diagnosis not present

## 2013-11-02 DIAGNOSIS — Z87891 Personal history of nicotine dependence: Secondary | ICD-10-CM | POA: Diagnosis not present

## 2013-11-02 DIAGNOSIS — E039 Hypothyroidism, unspecified: Secondary | ICD-10-CM | POA: Diagnosis present

## 2013-11-02 DIAGNOSIS — Z23 Encounter for immunization: Secondary | ICD-10-CM | POA: Diagnosis not present

## 2013-11-02 DIAGNOSIS — S82899A Other fracture of unspecified lower leg, initial encounter for closed fracture: Secondary | ICD-10-CM | POA: Diagnosis not present

## 2013-11-02 DIAGNOSIS — F411 Generalized anxiety disorder: Secondary | ICD-10-CM | POA: Diagnosis present

## 2013-11-02 DIAGNOSIS — M6281 Muscle weakness (generalized): Secondary | ICD-10-CM | POA: Diagnosis not present

## 2013-11-02 DIAGNOSIS — I10 Essential (primary) hypertension: Secondary | ICD-10-CM | POA: Diagnosis present

## 2013-11-02 DIAGNOSIS — K219 Gastro-esophageal reflux disease without esophagitis: Secondary | ICD-10-CM | POA: Diagnosis not present

## 2013-11-02 DIAGNOSIS — S8290XD Unspecified fracture of unspecified lower leg, subsequent encounter for closed fracture with routine healing: Secondary | ICD-10-CM | POA: Diagnosis not present

## 2013-11-02 DIAGNOSIS — Z01818 Encounter for other preprocedural examination: Secondary | ICD-10-CM | POA: Diagnosis not present

## 2013-11-02 DIAGNOSIS — S8253XB Displaced fracture of medial malleolus of unspecified tibia, initial encounter for open fracture type I or II: Secondary | ICD-10-CM | POA: Diagnosis not present

## 2013-11-02 HISTORY — DX: Essential (primary) hypertension: I10

## 2013-11-02 HISTORY — DX: Type 2 diabetes mellitus without complications: E11.9

## 2013-11-02 HISTORY — DX: Hypothyroidism, unspecified: E03.9

## 2013-11-02 HISTORY — DX: Gastro-esophageal reflux disease without esophagitis: K21.9

## 2013-11-02 HISTORY — DX: Anxiety disorder, unspecified: F41.9

## 2013-11-02 HISTORY — DX: Sleep apnea, unspecified: G47.30

## 2013-11-02 HISTORY — DX: Malignant (primary) neoplasm, unspecified: C80.1

## 2013-11-02 HISTORY — DX: Shortness of breath: R06.02

## 2013-11-02 HISTORY — DX: Headache: R51

## 2013-11-02 HISTORY — DX: Nasal congestion: R09.81

## 2013-11-02 LAB — CBC
HCT: 40.1 % (ref 36.0–46.0)
Hemoglobin: 13.6 g/dL (ref 12.0–15.0)
MCH: 31.1 pg (ref 26.0–34.0)
MCHC: 33.9 g/dL (ref 30.0–36.0)
MCV: 91.8 fL (ref 78.0–100.0)
Platelets: 375 10*3/uL (ref 150–400)
RBC: 4.37 MIL/uL (ref 3.87–5.11)
RDW: 13.3 % (ref 11.5–15.5)
WBC: 9.5 10*3/uL (ref 4.0–10.5)

## 2013-11-02 LAB — BASIC METABOLIC PANEL
Anion gap: 15 (ref 5–15)
BUN: 14 mg/dL (ref 6–23)
CO2: 26 mEq/L (ref 19–32)
Calcium: 10.5 mg/dL (ref 8.4–10.5)
Chloride: 99 mEq/L (ref 96–112)
Creatinine, Ser: 0.86 mg/dL (ref 0.50–1.10)
GFR calc Af Amer: 74 mL/min — ABNORMAL LOW (ref >90)
GFR calc non Af Amer: 64 mL/min — ABNORMAL LOW (ref >90)
Glucose, Bld: 101 mg/dL — ABNORMAL HIGH (ref 70–99)
Potassium: 3.7 mEq/L (ref 3.7–5.3)
Sodium: 140 mEq/L (ref 137–147)

## 2013-11-02 MED ORDER — ACETAMINOPHEN 500 MG PO TABS
1000.0000 mg | ORAL_TABLET | Freq: Once | ORAL | Status: AC
  Administered 2013-11-03: 1000 mg via ORAL
  Filled 2013-11-02: qty 2

## 2013-11-02 MED ORDER — SODIUM CHLORIDE 0.9 % IV SOLN: INTRAVENOUS | Status: DC

## 2013-11-02 MED ORDER — CEFAZOLIN SODIUM-DEXTROSE 2-3 GM-% IV SOLR
2.0000 g | INTRAVENOUS | Status: AC
  Administered 2013-11-03: 2 g via INTRAVENOUS
  Filled 2013-11-02: qty 50

## 2013-11-02 MED ORDER — CHLORHEXIDINE GLUCONATE 4 % EX LIQD: 60.0000 mL | Freq: Once | CUTANEOUS | Status: DC

## 2013-11-02 NOTE — Pre-Procedure Instructions (Signed)
Latriece Anstine  11/02/2013   Your procedure is scheduled on:  Thursday August 27 th at 1300 PM  Report to [facility] Admitting at 1100 AM.  Call this number if you have problems the morning of surgery: [phone]   Remember:   Do not eat food or drink liquids after midnight Wednesday   Take these medicines the morning of surgery with A SIP OF WATER:Levothyroxine(Synthroid) and Metoprolol(Toprol)   Do not wear jewelry, make-up or nail polish.  Do not wear lotions, powders, or perfumes. You may wear deodorant.  Do not shave 48 hours prior to surgery.   Do not bring valuables to the hospital.  [facility] is not responsible  for any belongings or valuables.               Contacts, dentures or bridgework may not be worn into surgery.  Leave suitcase in the car. After surgery it may be brought to your room.  For patients admitted to the hospital, discharge time is determined by your  treatment team.               Patients discharged the day of surgery will not be allowed to drive home.   Special Instructions: [facility] - Preparing for Surgery  Before surgery, you can play an important role.  Because skin is not sterile, your skin needs to be as free of germs as possible.  You can reduce the number of germs on you skin by washing with CHG (chlorahexidine gluconate) soap before surgery.  CHG is an antiseptic cleaner which kills germs and bonds with the skin to continue killing germs even after washing.  Please DO NOT use if you have an allergy to CHG or antibacterial soaps.  If your skin becomes reddened/irritated stop using the CHG and inform your nurse when you arrive at Short Stay.  Do not shave (including legs and underarms) for at least 48 hours prior to the first CHG shower.  You may shave your face.  Please follow these instructions carefully:   1.  Shower with CHG Soap the night before surgery and the                                morning of Surgery.  2.  If you  choose to wash your hair, wash your hair first as usual with your       normal shampoo.  3.  After you shampoo, rinse your hair and body thoroughly to remove the                      Shampoo.  4.  Use CHG as you would any other liquid soap.  You can apply chg directly       to the skin and wash gently with scrungie or a clean washcloth.  5.  Apply the CHG Soap to your body ONLY FROM THE NECK DOWN.        Do not use on open wounds or open sores.  Avoid contact with your eyes,       ears, mouth and genitals (private parts).  Wash genitals (private parts)       with your normal soap.  6.  Wash thoroughly, paying special attention to the area where your surgery        will be performed.  7.  Thoroughly rinse your body with warm water from the neck  down.  8.  DO NOT shower/wash with your normal soap after using and rinsing off       the CHG Soap.  9.  Pat yourself dry with a clean towel.            10.  Wear clean pajamas.            11.  Place clean sheets on your bed the night of your first shower and do not        sleep with pets.  Day of Surgery  Do not apply any lotions/deoderants the morning of surgery.  Please wear clean clothes to the hospital/surgery center.      Please read over the following fact sheets that you were given: Pain Booklet, Coughing and Deep Breathing and Surgical Site Infection Prevention

## 2013-11-03 ENCOUNTER — Encounter (HOSPITAL_COMMUNITY): Payer: Medicare Other | Admitting: Anesthesiology

## 2013-11-03 ENCOUNTER — Inpatient Hospital Stay (HOSPITAL_COMMUNITY): Payer: Medicare Other | Admitting: Anesthesiology

## 2013-11-03 ENCOUNTER — Encounter (HOSPITAL_COMMUNITY): Payer: Self-pay | Admitting: *Deleted

## 2013-11-03 ENCOUNTER — Encounter (HOSPITAL_COMMUNITY)
Admission: RE | Disposition: A | Discharge status: Skilled Nursing Facility | Payer: Self-pay | Source: Ambulatory Visit | Attending: Orthopedic Surgery

## 2013-11-03 ENCOUNTER — Inpatient Hospital Stay (HOSPITAL_COMMUNITY)
Admission: RE | Admit: 2013-11-03 | Discharge: 2013-11-07 | DRG: 494 | Disposition: A | Discharge status: Skilled Nursing Facility | Payer: Medicare Other | Source: Ambulatory Visit | Attending: Orthopedic Surgery | Admitting: Orthopedic Surgery

## 2013-11-03 DIAGNOSIS — S82899A Other fracture of unspecified lower leg, initial encounter for closed fracture: Secondary | ICD-10-CM | POA: Diagnosis not present

## 2013-11-03 DIAGNOSIS — S82209A Unspecified fracture of shaft of unspecified tibia, initial encounter for closed fracture: Principal | ICD-10-CM | POA: Diagnosis present

## 2013-11-03 DIAGNOSIS — Z85850 Personal history of malignant neoplasm of thyroid: Secondary | ICD-10-CM | POA: Diagnosis not present

## 2013-11-03 DIAGNOSIS — R269 Unspecified abnormalities of gait and mobility: Secondary | ICD-10-CM | POA: Diagnosis not present

## 2013-11-03 DIAGNOSIS — G8918 Other acute postprocedural pain: Secondary | ICD-10-CM | POA: Diagnosis not present

## 2013-11-03 DIAGNOSIS — S8253XB Displaced fracture of medial malleolus of unspecified tibia, initial encounter for open fracture type I or II: Secondary | ICD-10-CM | POA: Diagnosis not present

## 2013-11-03 DIAGNOSIS — E039 Hypothyroidism, unspecified: Secondary | ICD-10-CM | POA: Diagnosis not present

## 2013-11-03 DIAGNOSIS — S82409A Unspecified fracture of shaft of unspecified fibula, initial encounter for closed fracture: Secondary | ICD-10-CM | POA: Diagnosis present

## 2013-11-03 DIAGNOSIS — Z23 Encounter for immunization: Secondary | ICD-10-CM

## 2013-11-03 DIAGNOSIS — G4730 Sleep apnea, unspecified: Secondary | ICD-10-CM | POA: Diagnosis present

## 2013-11-03 DIAGNOSIS — Z87891 Personal history of nicotine dependence: Secondary | ICD-10-CM | POA: Diagnosis not present

## 2013-11-03 DIAGNOSIS — I10 Essential (primary) hypertension: Secondary | ICD-10-CM | POA: Diagnosis present

## 2013-11-03 DIAGNOSIS — IMO0002 Reserved for concepts with insufficient information to code with codable children: Secondary | ICD-10-CM | POA: Diagnosis not present

## 2013-11-03 DIAGNOSIS — R51 Headache: Secondary | ICD-10-CM | POA: Diagnosis not present

## 2013-11-03 DIAGNOSIS — S82839B Other fracture of upper and lower end of unspecified fibula, initial encounter for open fracture type I or II: Secondary | ICD-10-CM | POA: Diagnosis not present

## 2013-11-03 DIAGNOSIS — W19XXXA Unspecified fall, initial encounter: Secondary | ICD-10-CM | POA: Diagnosis present

## 2013-11-03 DIAGNOSIS — S8290XD Unspecified fracture of unspecified lower leg, subsequent encounter for closed fracture with routine healing: Secondary | ICD-10-CM | POA: Diagnosis not present

## 2013-11-03 DIAGNOSIS — F411 Generalized anxiety disorder: Secondary | ICD-10-CM | POA: Diagnosis present

## 2013-11-03 DIAGNOSIS — M25559 Pain in unspecified hip: Secondary | ICD-10-CM | POA: Diagnosis not present

## 2013-11-03 DIAGNOSIS — S82891A Other fracture of right lower leg, initial encounter for closed fracture: Secondary | ICD-10-CM | POA: Diagnosis present

## 2013-11-03 DIAGNOSIS — Z9181 History of falling: Secondary | ICD-10-CM | POA: Diagnosis not present

## 2013-11-03 DIAGNOSIS — E119 Type 2 diabetes mellitus without complications: Secondary | ICD-10-CM | POA: Diagnosis not present

## 2013-11-03 DIAGNOSIS — K219 Gastro-esophageal reflux disease without esophagitis: Secondary | ICD-10-CM | POA: Diagnosis not present

## 2013-11-03 DIAGNOSIS — M6281 Muscle weakness (generalized): Secondary | ICD-10-CM | POA: Diagnosis not present

## 2013-11-03 DIAGNOSIS — M25579 Pain in unspecified ankle and joints of unspecified foot: Secondary | ICD-10-CM | POA: Diagnosis present

## 2013-11-03 HISTORY — PX: OPEN REDUCTION INTERNAL FIXATION (ORIF) TIBIA/FIBULA FRACTURE: SHX5992

## 2013-11-03 LAB — GLUCOSE, CAPILLARY
Glucose-Capillary: 103 mg/dL — ABNORMAL HIGH (ref 70–99)
Glucose-Capillary: 105 mg/dL — ABNORMAL HIGH (ref 70–99)
Glucose-Capillary: 118 mg/dL — ABNORMAL HIGH (ref 70–99)
Glucose-Capillary: 143 mg/dL — ABNORMAL HIGH (ref 70–99)

## 2013-11-03 SURGERY — OPEN REDUCTION INTERNAL FIXATION (ORIF) TIBIA/FIBULA FRACTURE
Anesthesia: General | Laterality: Right

## 2013-11-03 MED ORDER — PROPOFOL 10 MG/ML IV BOLUS
INTRAVENOUS | Status: DC | PRN
  Administered 2013-11-03: 150 mg via INTRAVENOUS

## 2013-11-03 MED ORDER — LACTATED RINGERS IV SOLN
INTRAVENOUS | Status: DC
  Administered 2013-11-03: 12:00:00 via INTRAVENOUS

## 2013-11-03 MED ORDER — BUPIVACAINE-EPINEPHRINE (PF) 0.5% -1:200000 IJ SOLN
INTRAMUSCULAR | Status: DC | PRN
  Administered 2013-11-03: 30 mL via PERINEURAL

## 2013-11-03 MED ORDER — MIDAZOLAM HCL 5 MG/5ML IJ SOLN
INTRAMUSCULAR | Status: DC | PRN
  Administered 2013-11-03: 2 mg via INTRAVENOUS

## 2013-11-03 MED ORDER — SUCCINYLCHOLINE CHLORIDE 20 MG/ML IJ SOLN
INTRAMUSCULAR | Status: AC
  Filled 2013-11-03: qty 1

## 2013-11-03 MED ORDER — LOSARTAN POTASSIUM-HCTZ 100-12.5 MG PO TABS: 1.0000 | ORAL_TABLET | Freq: Every day | ORAL | Status: DC

## 2013-11-03 MED ORDER — DOCUSATE SODIUM 100 MG PO CAPS
100.0000 mg | ORAL_CAPSULE | Freq: Two times a day (BID) | ORAL | Status: DC
  Administered 2013-11-03 – 2013-11-07 (×8): 100 mg via ORAL
  Filled 2013-11-03 (×9): qty 1

## 2013-11-03 MED ORDER — ONDANSETRON HCL 4 MG PO TABS: 4.0000 mg | ORAL_TABLET | Freq: Four times a day (QID) | ORAL | Status: DC | PRN

## 2013-11-03 MED ORDER — METOPROLOL SUCCINATE ER 50 MG PO TB24
50.0000 mg | ORAL_TABLET | Freq: Every day | ORAL | Status: DC
  Administered 2013-11-04 – 2013-11-07 (×4): 50 mg via ORAL
  Filled 2013-11-03 (×4): qty 1

## 2013-11-03 MED ORDER — METOCLOPRAMIDE HCL 10 MG PO TABS: 5.0000 mg | ORAL_TABLET | Freq: Three times a day (TID) | ORAL | Status: DC | PRN

## 2013-11-03 MED ORDER — ZOLPIDEM TARTRATE 5 MG PO TABS: 5.0000 mg | ORAL_TABLET | Freq: Every evening | ORAL | Status: DC | PRN

## 2013-11-03 MED ORDER — LIDOCAINE HCL (CARDIAC) 20 MG/ML IV SOLN
INTRAVENOUS | Status: DC | PRN
  Administered 2013-11-03: 40 mg via INTRAVENOUS

## 2013-11-03 MED ORDER — ONDANSETRON HCL 4 MG/2ML IJ SOLN
INTRAMUSCULAR | Status: DC | PRN
  Administered 2013-11-03: 4 mg via INTRAVENOUS

## 2013-11-03 MED ORDER — 0.9 % SODIUM CHLORIDE (POUR BTL) OPTIME
TOPICAL | Status: DC | PRN
  Administered 2013-11-03: 1000 mL

## 2013-11-03 MED ORDER — DEXAMETHASONE SODIUM PHOSPHATE 4 MG/ML IJ SOLN
INTRAMUSCULAR | Status: AC
  Filled 2013-11-03: qty 1

## 2013-11-03 MED ORDER — FENTANYL CITRATE 0.05 MG/ML IJ SOLN
INTRAMUSCULAR | Status: AC
  Filled 2013-11-03: qty 2

## 2013-11-03 MED ORDER — SCOPOLAMINE 1 MG/3DAYS TD PT72
1.0000 | MEDICATED_PATCH | TRANSDERMAL | Status: DC
  Administered 2013-11-03: 1 via TRANSDERMAL

## 2013-11-03 MED ORDER — SIMVASTATIN 20 MG PO TABS
20.0000 mg | ORAL_TABLET | Freq: Every day | ORAL | Status: DC
  Administered 2013-11-03 – 2013-11-07 (×5): 20 mg via ORAL
  Filled 2013-11-03 (×5): qty 1

## 2013-11-03 MED ORDER — LOSARTAN POTASSIUM 50 MG PO TABS
100.0000 mg | ORAL_TABLET | Freq: Every day | ORAL | Status: DC
  Administered 2013-11-03 – 2013-11-07 (×5): 100 mg via ORAL
  Filled 2013-11-03 (×5): qty 2

## 2013-11-03 MED ORDER — ENOXAPARIN SODIUM 40 MG/0.4ML ~~LOC~~ SOLN
40.0000 mg | SUBCUTANEOUS | Status: DC
  Administered 2013-11-04 – 2013-11-07 (×4): 40 mg via SUBCUTANEOUS
  Filled 2013-11-03 (×5): qty 0.4

## 2013-11-03 MED ORDER — METOCLOPRAMIDE HCL 5 MG/ML IJ SOLN: 5.0000 mg | Freq: Three times a day (TID) | INTRAMUSCULAR | Status: DC | PRN

## 2013-11-03 MED ORDER — FENTANYL CITRATE 0.05 MG/ML IJ SOLN: 25.0000 ug | INTRAMUSCULAR | Status: DC | PRN

## 2013-11-03 MED ORDER — DIPHENHYDRAMINE HCL 50 MG/ML IJ SOLN
INTRAMUSCULAR | Status: DC | PRN
  Administered 2013-11-03: 12.5 mg via INTRAVENOUS

## 2013-11-03 MED ORDER — SODIUM CHLORIDE 0.9 % IV SOLN
INTRAVENOUS | Status: DC
  Administered 2013-11-03: 21:00:00 via INTRAVENOUS

## 2013-11-03 MED ORDER — CITALOPRAM HYDROBROMIDE 20 MG PO TABS
20.0000 mg | ORAL_TABLET | Freq: Every day | ORAL | Status: DC
  Administered 2013-11-03 – 2013-11-06 (×4): 20 mg via ORAL
  Filled 2013-11-03 (×5): qty 1

## 2013-11-03 MED ORDER — ONDANSETRON HCL 4 MG/2ML IJ SOLN: 4.0000 mg | Freq: Four times a day (QID) | INTRAMUSCULAR | Status: DC | PRN

## 2013-11-03 MED ORDER — SUCCINYLCHOLINE CHLORIDE 20 MG/ML IJ SOLN
INTRAMUSCULAR | Status: DC | PRN
  Administered 2013-11-03: 120 mg via INTRAVENOUS

## 2013-11-03 MED ORDER — HYDROMORPHONE HCL PF 1 MG/ML IJ SOLN: 0.5000 mg | INTRAMUSCULAR | Status: DC | PRN

## 2013-11-03 MED ORDER — DIPHENHYDRAMINE HCL 50 MG/ML IJ SOLN
INTRAMUSCULAR | Status: AC
  Filled 2013-11-03: qty 1

## 2013-11-03 MED ORDER — MIDAZOLAM HCL 2 MG/2ML IJ SOLN
INTRAMUSCULAR | Status: AC
  Filled 2013-11-03: qty 2

## 2013-11-03 MED ORDER — PROPOFOL 10 MG/ML IV BOLUS
INTRAVENOUS | Status: AC
  Filled 2013-11-03: qty 20

## 2013-11-03 MED ORDER — DEXAMETHASONE SODIUM PHOSPHATE 4 MG/ML IJ SOLN
INTRAMUSCULAR | Status: DC | PRN
  Administered 2013-11-03: 4 mg via INTRAVENOUS

## 2013-11-03 MED ORDER — MEPERIDINE HCL 25 MG/ML IJ SOLN: 6.2500 mg | INTRAMUSCULAR | Status: DC | PRN

## 2013-11-03 MED ORDER — BACITRACIN ZINC 500 UNIT/GM EX OINT
TOPICAL_OINTMENT | CUTANEOUS | Status: AC
  Filled 2013-11-03: qty 15

## 2013-11-03 MED ORDER — ONDANSETRON HCL 4 MG/2ML IJ SOLN
INTRAMUSCULAR | Status: AC
  Filled 2013-11-03: qty 2

## 2013-11-03 MED ORDER — GLIPIZIDE 5 MG PO TABS
5.0000 mg | ORAL_TABLET | Freq: Every day | ORAL | Status: DC
  Administered 2013-11-04 – 2013-11-07 (×4): 5 mg via ORAL
  Filled 2013-11-03 (×5): qty 1

## 2013-11-03 MED ORDER — EPHEDRINE SULFATE 50 MG/ML IJ SOLN
INTRAMUSCULAR | Status: DC | PRN
  Administered 2013-11-03: 5 mg via INTRAVENOUS

## 2013-11-03 MED ORDER — FENTANYL CITRATE 0.05 MG/ML IJ SOLN
INTRAMUSCULAR | Status: DC | PRN
  Administered 2013-11-03: 100 ug via INTRAVENOUS

## 2013-11-03 MED ORDER — HYDROCHLOROTHIAZIDE 12.5 MG PO CAPS
12.5000 mg | ORAL_CAPSULE | Freq: Every day | ORAL | Status: DC
  Administered 2013-11-03 – 2013-11-07 (×4): 12.5 mg via ORAL
  Filled 2013-11-03 (×5): qty 1

## 2013-11-03 MED ORDER — ARTIFICIAL TEARS OP OINT
TOPICAL_OINTMENT | OPHTHALMIC | Status: DC | PRN
  Administered 2013-11-03: 1 via OPHTHALMIC

## 2013-11-03 MED ORDER — INSULIN ASPART 100 UNIT/ML ~~LOC~~ SOLN
0.0000 [IU] | Freq: Three times a day (TID) | SUBCUTANEOUS | Status: DC
  Administered 2013-11-04: 1 [IU] via SUBCUTANEOUS

## 2013-11-03 MED ORDER — PNEUMOCOCCAL VAC POLYVALENT 25 MCG/0.5ML IJ INJ
0.5000 mL | INJECTION | INTRAMUSCULAR | Status: DC
  Filled 2013-11-03: qty 0.5

## 2013-11-03 MED ORDER — SENNA 8.6 MG PO TABS
1.0000 | ORAL_TABLET | Freq: Two times a day (BID) | ORAL | Status: DC
  Administered 2013-11-03 – 2013-11-07 (×8): 8.6 mg via ORAL
  Filled 2013-11-03 (×9): qty 1

## 2013-11-03 MED ORDER — PROMETHAZINE HCL 25 MG/ML IJ SOLN
6.2500 mg | INTRAMUSCULAR | Status: DC | PRN
Start: 2013-11-03 — End: 2013-11-08

## 2013-11-03 MED ORDER — BUPIVACAINE HCL (PF) 0.25 % IJ SOLN
INTRAMUSCULAR | Status: AC
  Filled 2013-11-03: qty 30

## 2013-11-03 MED ORDER — OXYCODONE HCL 5 MG PO TABS
5.0000 mg | ORAL_TABLET | ORAL | Status: DC | PRN
  Administered 2013-11-04 – 2013-11-07 (×9): 10 mg via ORAL
  Filled 2013-11-03 (×10): qty 2

## 2013-11-03 MED ORDER — FENTANYL CITRATE 0.05 MG/ML IJ SOLN
INTRAMUSCULAR | Status: AC
  Filled 2013-11-03: qty 5

## 2013-11-03 MED ORDER — SCOPOLAMINE 1 MG/3DAYS TD PT72
MEDICATED_PATCH | TRANSDERMAL | Status: AC
  Filled 2013-11-03: qty 1

## 2013-11-03 MED ORDER — LEVOTHYROXINE SODIUM 88 MCG PO TABS
88.0000 ug | ORAL_TABLET | Freq: Every day | ORAL | Status: DC
  Administered 2013-11-04 – 2013-11-07 (×4): 88 ug via ORAL
  Filled 2013-11-03 (×5): qty 1

## 2013-11-03 MED ORDER — LACTATED RINGERS IV SOLN
INTRAVENOUS | Status: DC | PRN
  Administered 2013-11-03 (×2): via INTRAVENOUS

## 2013-11-03 SURGICAL SUPPLY — 75 items
BANDAGE ESMARK 6X9 LF (GAUZE/BANDAGES/DRESSINGS) ×1 IMPLANT
BIT DRILL 2.5X2.75 QC CALB (BIT) ×2 IMPLANT
BIT DRILL 3.5X5.5 QC CALB (BIT) ×2 IMPLANT
BIT DRILL CALIBRATED 2.7 (BIT) ×2 IMPLANT
BLADE SURG 15 STRL LF DISP TIS (BLADE) ×1 IMPLANT
BLADE SURG 15 STRL SS (BLADE) ×1
BNDG COHESIVE 4X5 TAN STRL (GAUZE/BANDAGES/DRESSINGS) ×2 IMPLANT
BNDG COHESIVE 6X5 TAN STRL LF (GAUZE/BANDAGES/DRESSINGS) ×2 IMPLANT
BNDG ESMARK 6X9 LF (GAUZE/BANDAGES/DRESSINGS) ×2
CANISTER SUCT 3000ML (MISCELLANEOUS) ×2 IMPLANT
CHLORAPREP W/TINT 26ML (MISCELLANEOUS) ×2 IMPLANT
COVER SURGICAL LIGHT HANDLE (MISCELLANEOUS) ×2 IMPLANT
CUFF TOURNIQUET SINGLE 34IN LL (TOURNIQUET CUFF) ×2 IMPLANT
CUFF TOURNIQUET SINGLE 44IN (TOURNIQUET CUFF) IMPLANT
DRAPE C-ARM 42X72 X-RAY (DRAPES) IMPLANT
DRAPE C-ARMOR (DRAPES) IMPLANT
DRAPE INCISE IOBAN 66X45 STRL (DRAPES) ×2 IMPLANT
DRAPE OEC MINIVIEW 54X84 (DRAPES) ×2 IMPLANT
DRAPE ORTHO SPLIT 77X108 STRL (DRAPES) ×1
DRAPE SURG ORHT 6 SPLT 77X108 (DRAPES) ×1 IMPLANT
DRAPE U-SHAPE 47X51 STRL (DRAPES) ×2 IMPLANT
DRSG ADAPTIC 3X8 NADH LF (GAUZE/BANDAGES/DRESSINGS) ×2 IMPLANT
DRSG PAD ABDOMINAL 8X10 ST (GAUZE/BANDAGES/DRESSINGS) ×4 IMPLANT
ELECT REM PT RETURN 9FT ADLT (ELECTROSURGICAL) ×2
ELECTRODE REM PT RTRN 9FT ADLT (ELECTROSURGICAL) ×1 IMPLANT
GAUZE SPONGE 4X4 12PLY STRL (GAUZE/BANDAGES/DRESSINGS) ×2 IMPLANT
GLOVE BIO SURGEON STRL SZ7 (GLOVE) ×2 IMPLANT
GLOVE BIO SURGEON STRL SZ8 (GLOVE) ×2 IMPLANT
GLOVE BIOGEL PI IND STRL 7.5 (GLOVE) ×2 IMPLANT
GLOVE BIOGEL PI IND STRL 8 (GLOVE) ×1 IMPLANT
GLOVE BIOGEL PI INDICATOR 7.5 (GLOVE) ×2
GLOVE BIOGEL PI INDICATOR 8 (GLOVE) ×1
GLOVE ECLIPSE 7.0 STRL STRAW (GLOVE) ×2 IMPLANT
GOWN STRL REUS W/ TWL LRG LVL3 (GOWN DISPOSABLE) ×2 IMPLANT
GOWN STRL REUS W/ TWL XL LVL3 (GOWN DISPOSABLE) ×1 IMPLANT
GOWN STRL REUS W/TWL LRG LVL3 (GOWN DISPOSABLE) ×2
GOWN STRL REUS W/TWL XL LVL3 (GOWN DISPOSABLE) ×1
KIT BASIN OR (CUSTOM PROCEDURE TRAY) ×2 IMPLANT
KIT ROOM TURNOVER OR (KITS) ×2 IMPLANT
NEEDLE 22X1 1/2 (OR ONLY) (NEEDLE) IMPLANT
NS IRRIG 1000ML POUR BTL (IV SOLUTION) ×2 IMPLANT
PACK ORTHO EXTREMITY (CUSTOM PROCEDURE TRAY) ×2 IMPLANT
PAD ABD 8X10 STRL (GAUZE/BANDAGES/DRESSINGS) ×2 IMPLANT
PAD ARMBOARD 7.5X6 YLW CONV (MISCELLANEOUS) ×4 IMPLANT
PAD CAST 4YDX4 CTTN HI CHSV (CAST SUPPLIES) ×1 IMPLANT
PADDING CAST COTTON 4X4 STRL (CAST SUPPLIES) ×1
PADDING CAST COTTON 6X4 STRL (CAST SUPPLIES) ×2 IMPLANT
PLATE LOCK 7H 92 BILAT FIB (Plate) ×2 IMPLANT
SCREW ACE CAN 4.0 42M (Screw) ×2 IMPLANT
SCREW ACE CAN 4.0 44M (Screw) ×4 IMPLANT
SCREW ACE CAN 4.0 46M (Screw) ×2 IMPLANT
SCREW CORTICAL 3.5MM 22MM (Screw) ×2 IMPLANT
SCREW LOCK 3.5X10 DIST TIB (Screw) ×2 IMPLANT
SCREW LOCK CANC STAR 4X12 (Screw) ×2 IMPLANT
SCREW LOCK CORT STAR 3.5X12 (Screw) ×2 IMPLANT
SCREW LOCK CORT STAR 3.5X16 (Screw) ×2 IMPLANT
SCREW LOW PROFILE 18MMX3.5MM (Screw) ×2 IMPLANT
SCREW NON LOCKING LP 3.5 16MM (Screw) ×2 IMPLANT
SPONGE GAUZE 4X4 12PLY STER LF (GAUZE/BANDAGES/DRESSINGS) ×2 IMPLANT
SPONGE LAP 18X18 X RAY DECT (DISPOSABLE) ×2 IMPLANT
STAPLER VISISTAT 35W (STAPLE) ×2 IMPLANT
SUCTION FRAZIER TIP 10 FR DISP (SUCTIONS) ×2 IMPLANT
SUT PROLENE 3 0 PS 2 (SUTURE) ×2 IMPLANT
SUT VIC AB 2-0 CT1 27 (SUTURE) ×2
SUT VIC AB 2-0 CT1 TAPERPNT 27 (SUTURE) ×2 IMPLANT
SUT VIC AB 3-0 PS2 18 (SUTURE) ×1
SUT VIC AB 3-0 PS2 18XBRD (SUTURE) ×1 IMPLANT
SYR CONTROL 10ML LL (SYRINGE) IMPLANT
TOWEL OR 17X24 6PK STRL BLUE (TOWEL DISPOSABLE) ×2 IMPLANT
TOWEL OR 17X26 10 PK STRL BLUE (TOWEL DISPOSABLE) ×2 IMPLANT
TUBE CONNECTING 12X1/4 (SUCTIONS) ×2 IMPLANT
WASHER FLAT ACE (Orthopedic Implant) ×1 IMPLANT
WASHER PLAIN FLAT ACE NS 3PK (Orthopedic Implant) ×1 IMPLANT
WATER STERILE IRR 1000ML POUR (IV SOLUTION) ×2 IMPLANT
WIRE K 1.6MM 144256 (MISCELLANEOUS) ×6 IMPLANT

## 2013-11-03 NOTE — H&P (Signed)
Miranda Wilkinson is an 78 y.o. female.   Chief Complaint:  Right ankle fracture HPI:  78 y/o female with right fibula and tibial pilon fracture after a fall at the beach last [place].  She presents now for ORIF of both fractures.  Past Medical History  Diagnosis Date  . Hypertension   . Sleep apnea     study done in [place], [place], use CPAP  . Shortness of breath   . Sinus congestion   . Hypothyroidism   . Diabetes mellitus without complication   . Anxiety   . GERD (gastroesophageal reflux disease)   . Headache(784.0)     hx migraines  . 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 Thyroid exploration    . Appendectomy    . Cholecystectomy    . Tubal ligation    . Ovarian cyst surgery Left     age 44    History reviewed. No pertinent family history. Social History:  reports that she quit smoking about 2 years ago. Her smoking use included Cigarettes. She has a 90 pack-year smoking history. She has never used smokeless tobacco. She reports that she does not drink alcohol or use illicit drugs.  Allergies: No Known Allergies  Medications Prior to Admission  Medication Sig Dispense Refill  . citalopram (CELEXA) 20 MG tablet Take 20 mg by mouth at bedtime.      Marland Kitchen glipiZIDE (GLUCOTROL) 5 MG tablet Take 5 mg by mouth daily before breakfast.      . levothyroxine (SYNTHROID, LEVOTHROID) 88 MCG tablet Take 88 mcg by mouth daily before breakfast.      . losartan-hydrochlorothiazide (HYZAAR) 100-12.5 MG per tablet Take 1 tablet by mouth daily.      . metoprolol succinate (TOPROL-XL) 50 MG 24 hr tablet Take 50 mg by mouth daily. Take with or immediately following a meal.      . simvastatin (ZOCOR) 20 MG tablet Take 20 mg by mouth daily.        Results for orders placed during the hospital encounter of 11/03/13 (from the past 48 hour(s))  GLUCOSE, CAPILLARY     Status: Abnormal   Collection Time    11/03/13 11:08 AM      Result Value Ref Range   Glucose-Capillary 118 (*) 70 - 99 mg/dL   Dg Chest 2 View  11/02/2013   CLINICAL DATA:  Preop chest x-ray prior to surgery. Hypertension. Diabetes.  EXAM: CHEST  2 VIEW  COMPARISON:  None.  FINDINGS: Mediastinum and hilar structures are normal. Subsegmental atelectasis and/or scarring left lung base. No pleural effusion or pneumothorax. Borderline cardiomegaly. Normal pulmonary vascularity. No acute bony abnormality. Degenerative changes both shoulders and thoracic spine. Old right posterior lateral right rib fractures present.  IMPRESSION: Mild left base subsegmental atelectasis and or scarring. No acute cardiopulmonary disease otherwise noted.   Electronically Signed   By: Miranda Wilkinson  Register   On: 11/02/2013 14:41    ROS  No recent f/c/n/v/wt loss   Blood pressure 163/53, pulse 67, temperature 98.5 F (36.9 C), temperature source Oral, resp. rate 18, height 5\' 5"  (1.651 m), weight 93.951 kg (207 lb 2 oz), SpO2 94.00%. Physical Exam  WN WD woman in nad.  A and O x 4.  Mood and affect normal.  EOMI.  resp unlabored.  R LE with healthy and intact skin.  Sens to LT intact at the dorsal and plantar foot.  5/5 strength  in PF and DF of the ankle.  No lymphadenopathy.  Assessment/Plan R tibial pilon and fibula fractures - to OR for ORIF of right tibial and fibula fractures.  The risks and benefits of the alternative treatment options have been discussed in detail.  The patient wishes to proceed with surgery and specifically understands risks of bleeding, infection, nerve damage, blood clots, need for additional surgery, amputation and death.   Miranda Wilkinson 2013-11-18, 12:50 PM

## 2013-11-03 NOTE — Transfer of Care (Signed)
Immediate Anesthesia Transfer of Care Note  Patient: Miranda Wilkinson  Procedure(s) Performed: Procedure(s): OPEN REDUCTION INTERNAL FIXATION (ORIF) RIGHT TIBIA/FIBULA PILON FRACTURE (Right)  Patient Location: PACU  Anesthesia Type:General  Level of Consciousness: awake, oriented, patient cooperative and lethargic  Airway & Oxygen Therapy: Patient Spontanous Breathing and Patient connected to nasal cannula oxygen  Post-op Assessment: Report given to PACU RN and Post -op Vital signs reviewed and stable  Post vital signs: Reviewed and stable  Complications: No apparent anesthesia complications

## 2013-11-03 NOTE — Progress Notes (Signed)
Dr.Manny at bedside, sign out rec'd, pt ok to transfer to floor

## 2013-11-03 NOTE — Progress Notes (Signed)
Utilization review completed.  

## 2013-11-03 NOTE — Brief Op Note (Signed)
11/03/2013  3:12 PM  PATIENT:  Miranda Wilkinson  78 y.o. female  PRE-OPERATIVE DIAGNOSIS:  right fibula and tibial pilon fractures  POST-OPERATIVE DIAGNOSIS:  right fibula and tibial pilon fractures  Procedure(s): 1.  ORIF right tibial pilon and fibula fractures   2.  AP, lateral and mortise radiographs of right ankle   SURGEON:  Wylene Simmer, MD  ASSISTANT: Lorin Mercy, PA-C  ANESTHESIA:   General, regional  EBL:  minimal   TOURNIQUET:  1:15 at 852 mm Hg  COMPLICATIONS:  None apparent  DISPOSITION:  Extubated, awake and stable to recovery.  DICTATION ID:  [id number]

## 2013-11-03 NOTE — Anesthesia Procedure Notes (Addendum)
Procedure Name: Intubation Date/Time: 11/03/2013 1:41 PM Performed by: Ned Grace Pre-anesthesia Checklist: Patient identified, Patient being monitored, Emergency Drugs available, Timeout performed and Suction available Patient Re-evaluated:Patient Re-evaluated prior to inductionOxygen Delivery Method: Circle system utilized Preoxygenation: Pre-oxygenation with 100% oxygen Intubation Type: IV induction Ventilation: Two handed mask ventilation required Laryngoscope Size: Mac and 3 Grade View: Grade I Tube type: Oral Tube size: 7.5 mm Number of attempts: 1 Airway Equipment and Method: Stylet Secured at: 22 cm Tube secured with: Tape Dental Injury: Teeth and Oropharynx as per pre-operative assessment    Anesthesia Regional Block:  Popliteal block  Pre-Anesthetic Checklist: ,, timeout performed,, Correct Site,, Correct Procedure,, site marked,,, surgical consent,, at surgeon's request  Laterality: Right  Prep: Maximum Sterile Barrier Precautions used and chloraprep       Needles:   Needle Type: Echogenic Stimulator Needle     Needle Length: 9cm 9 cm Needle Gauge: 21 and 21 G    Additional Needles:  Procedures: ultrasound guided (picture in chart) Popliteal block  Nerve Stimulator or Paresthesia:  Response: 0.3 mA,   Additional Responses:   Narrative:  Anesthesiologist: Corina Stacy,   Additional Notes: R popiteal block, [place] and stimulator guided.  30cc .5% marcaine with epi, slow injection with multiple neg aspirations.  Pt talking to me during entire procedure

## 2013-11-03 NOTE — Anesthesia Preprocedure Evaluation (Addendum)
Anesthesia Evaluation   Patient awake    Reviewed: Allergy & Precautions, H&P , NPO status   Airway Mallampati: II      Dental  (+) Teeth Intact   Pulmonary sleep apnea, Continuous Positive Airway Pressure Ventilation and Oxygen sleep apnea , former smoker,  breath sounds clear to auscultation        Cardiovascular hypertension, Pt. on medications Rhythm:Regular Rate:Normal     Neuro/Psych Anxiety    GI/Hepatic GERD-  Medicated,  Endo/Other  diabetes, Type 2, Oral Hypoglycemic Agents  Renal/GU      Musculoskeletal   Abdominal (+)  Abdomen: soft.    Peds  Hematology   Anesthesia Other Findings Fever blisters onlips  Reproductive/Obstetrics                          Anesthesia Physical Anesthesia Plan  ASA: III  Anesthesia Plan: General   Post-op Pain Management: MAC Combined w/ Regional for Post-op pain   Induction:   Airway Management Planned: Oral ETT  Additional Equipment:   Intra-op Plan:   Post-operative Plan: Extubation in OR  Informed Consent: I have reviewed the patients History and Physical, chart, labs and discussed the procedure including the risks, benefits and alternatives for the proposed anesthesia with the patient or authorized representative who has indicated his/her understanding and acceptance.     Plan Discussed with:   Anesthesia Plan Comments:         Anesthesia Quick Evaluation

## 2013-11-03 NOTE — Progress Notes (Signed)
Placed pt.on cpap. Pt. Tolerating well at this time.

## 2013-11-03 NOTE — Anesthesia Postprocedure Evaluation (Signed)
  Anesthesia Post-op Note  Patient: Miranda Wilkinson  Procedure(s) Performed: Procedure(s): OPEN REDUCTION INTERNAL FIXATION (ORIF) RIGHT TIBIA/FIBULA PILON FRACTURE (Right)  Patient Location: PACU  Anesthesia Type:General  Level of Consciousness: awake, alert  and oriented  Airway and Oxygen Therapy: Patient Spontanous Breathing and Patient connected to nasal cannula oxygen  Post-op Pain: none  Post-op Assessment: Post-op Vital signs reviewed, Patient's Cardiovascular Status Stable and Respiratory Function Stable  Post-op Vital Signs: Reviewed and stable  Last Vitals:  Filed Vitals:   11/03/13 1530  BP:   Pulse: 68  Temp:   Resp: 16    Complications: No apparent anesthesia complications

## 2013-11-04 ENCOUNTER — Encounter (HOSPITAL_COMMUNITY): Payer: Self-pay | Admitting: Student

## 2013-11-04 LAB — GLUCOSE, CAPILLARY
Glucose-Capillary: 115 mg/dL — ABNORMAL HIGH (ref 70–99)
Glucose-Capillary: 95 mg/dL (ref 70–99)
Glucose-Capillary: 128 mg/dL — ABNORMAL HIGH (ref 70–99)
Glucose-Capillary: 106 mg/dL — ABNORMAL HIGH (ref 70–99)

## 2013-11-04 NOTE — Op Note (Signed)
NAMESHACORA, Miranda Wilkinson NO.:  [id number]  MEDICAL RECORD NO.:  [id number]  LOCATION:  5N19C                        FACILITY:  [facility]  PHYSICIAN:  Wylene Simmer, MD        DATE OF BIRTH:  30-Jan-1936  DATE OF PROCEDURE:  11/03/2013 DATE OF DISCHARGE:                              OPERATIVE REPORT   PREOPERATIVE DIAGNOSES:  Right tibial pilon and fibular fractures.  POSTOPERATIVE DIAGNOSES:  Right tibial pilon and fibular fractures.  PROCEDURE: 1. Open reduction and internal fixation of right tibial pilon and     fibular fractures 2. AP, lateral, and mortise radiographs of the right ankle.  SURGEON:  Wylene Simmer, MD.  ASSISTANT:  Lorin Mercy, PA-C.  ANESTHESIA:  General, regional.  ESTIMATED BLOOD LOSS:  Minimal.  TOURNIQUET TIME:  Approximately 1 hour and 15 minutes at 300 mmHg.  COMPLICATIONS:  None apparent.  DISPOSITION:  Extubated, awake, and stable to recovery.  INDICATIONS FOR PROCEDURE:  The patient is a 78 year old woman who fell while on vacation last week injuring her right ankle.  She underwent closed reduction of her tibial pilon and fibula fractures.  In the emergency department in [facility].  She presents now for operative treatment of this displaced unstable fracture.  CT scan reveals comminution at the tibial plafond and the distal fibula.  The patient understands the risks and benefits, the alternative treatment options, and elects surgical treatment.  She specifically understands risks of bleeding, infection, nerve damage, blood clots, need for additional surgery, continued pain, amputation, and death.  PROCEDURE IN DETAIL:  After preoperative consent was obtained, the correct operative site was identified.  The patient was brought to the operating room and placed supine on the operating table.  General anesthesia was induced.  Preoperative antibiotics were administered. Surgical time-out was taken.  The right lower extremity was  prepped and draped in standard sterile fashion.  The extremity was exsanguinated. The tourniquet was inflated to 300 mmHg.  A curvilinear incision was made at the posterolateral aspect of the ankle.  Sharp dissection was carried down through the skin and subcutaneous tissue, taking care to protect branches of the sural nerve.  The interval between the peroneals and the flexor hallucis longus was developed.  The posterior malleolus fracture was mobilized.  It was reduced and clamped with a Weber tenaculum.  A K-wire was inserted from anterior to posterior across the fracture site.  AP and lateral radiographs confirmed appropriate reduction of the posterior malleolus fracture.  Two 4-mm partially- threaded cannulated screws were inserted percutaneously from anterior to posterior  compressing the fracture site appropriately.  Attention was then returned to the fibula.  The fracture was reduced and held with a tenaculum after clearing all the hematoma.  The lag screw was inserted with a washer from posterior to anterior across the fracture site.  It was noted to have adequate purchase.  A [employer] ALPS fibular composite plate was contoured to fit the lateral malleolus.  It was fixed proximally and distally with a nonlocking screw.  Appropriate position was confirmed with AP and lateral radiographs.  The remaining holes were drilled and filled with locking screws with the exception  of the most proximal hole in the plate.  This was filled with a nonlocking screw.  The radiographs again showed appropriate position of the plate and appropriate reduction of the fibular fracture.  K-wires were inserted percutaneously at the medial malleolus fracture.  Stab incisions were made.  AP and lateral radiographs showed appropriate position of both guide pins.  A 4-mm partially threaded cannulated screws were inserted, were noted to have have excellent purchase and held the fracture site in a reduced  position.  Final AP, mortise, and lateral radiographs confirmed appropriate position length of all hardware and appropriate alignment of the fractures.  Wounds were all irrigated copiously.  Stab incisions were all closed with horizontal mattress or simple sutures of 3-0 Prolene.  The lateral incision was closed with 3-0 Vicryl, the subcutaneous layer and running 3-0 nylon with layer of the skin.  Sterile dressings were applied followed by a well-padded short-leg splint.  The patient was then awakened from anesthesia and transported to recovery room in stable condition.  FOLLOWUP PLAN:  The patient will be nonweightbearing on the right lower extremity.  She will have physical therapy and occupational therapy consults as well as social work consult for likely placement in a skilled [place].  She will start DVT prophylaxis tomorrow.  Lorin Mercy, PA-C was present, scrubbed for the duration of the case. Her assistance was essential in gaining and maintaining exposure, performing the operation, closing and dressing the wounds, and applying the splint.  RADIOGRAPHS:  AP, mortise, and lateral radiographs of the right ankle were obtained intraoperatively today.  These show interval reduction and fixation of the tibial pilon and fibular fractures.  Hardware is appropriately positioned and of the appropriate length.  No other acute injury is noted.     Wylene Simmer, MD     JH/MEDQ  D:  11/03/2013  T:  11/03/2013  Job:  [id number]

## 2013-11-04 NOTE — Progress Notes (Signed)
Placed pt. On cpap. Pt. Tolerating well at this time. 

## 2013-11-04 NOTE — Progress Notes (Signed)
Subjective: 1 Day Post-Op Procedure(s) (LRB): OPEN REDUCTION INTERNAL FIXATION (ORIF) RIGHT TIBIA/FIBULA PILON FRACTURE (Right) Patient reports pain as mild.  She states that she is feeling well this morning and has no new complaints. She was able to get good rest last night and her pain is well controlled with her pain medications. She denies any new HA, CP, sOB, N/V, fever, chills, calf pain or swelling.   Objective: Vital signs in last 24 hours: Temp:  [97.6 F (36.4 C)-98.7 F (37.1 C)] 98.1 F (36.7 C) (08/28 0530) Pulse Rate:  [57-74] 60 (08/28 0530) Resp:  [13-20] 16 (08/28 0530) BP: (120-163)/(42-53) 124/50 mmHg (08/28 0530) SpO2:  [91 %-100 %] 91 % (08/28 0530) Weight:  [93.951 kg (207 lb 2 oz)] 93.951 kg (207 lb 2 oz) (08/27 1105)  Intake/Output from previous day: 08/27 0701 - 08/28 0700 In: 1360 [P.O.:360; I.V.:1000] Out: 125 [Urine:100; Blood:25] Intake/Output this shift:     Recent Labs  11/02/13 1358  HGB 13.6    Recent Labs  11/02/13 1358  WBC 9.5  RBC 4.37  HCT 40.1  PLT 375    Recent Labs  11/02/13 1358  NA 140  K 3.7  CL 99  CO2 26  BUN 14  CREATININE 0.86  GLUCOSE 101*  CALCIUM 10.5   No results found for this basename: LABPT, INR,  in the last 72 hours  WD/WN caucasian female in nad, resting comfortably in bed. A and O x4. Mood and affect are appropriate. EOMI. Respriation normal and unlabored; CPAP in place for sleep apnea. RLE in hard splint. Dressings C/D/I. NV intact withi brisk capillary refill. Distal sensation intact bilaterally. 5/5 strength of toe extensors and flexors. No calf swelling or palpable cords. No lympahdenopathy.  Assessment/Plan: 1 Day Post-Op Procedure(s) (LRB): OPEN REDUCTION INTERNAL FIXATION (ORIF) RIGHT TIBIA/FIBULA PILON FRACTURE (Right) Up with therapy, NWB RLE SW consult in place, will likely need SNF placement.  Pt and family interested in rehab facility placement.  PO narcotics for pain  management Lovenox for DVT prophylaxis   Miranda Wilkinson 11/04/2013, 8:11 AM

## 2013-11-04 NOTE — Evaluation (Signed)
Physical Therapy Evaluation Patient Details Name: Miranda Wilkinson MRN: [id number] DOB: 13-Sep-1935 Today's Date: 11/04/2013   History of Present Illness  78 y/o female with right fibula and tibial pilon fracture after [place] and now s/p ORIF right tibial pilon and fibula fractures  Clinical Impression  Pt demos good participation, however is concerned about being a burden to care for over the next 6-8weeks until she is allowed to WB.  Feel pt would benefit from SNF at D/C to maximize independence.  Will continue to follow.      Follow Up Recommendations SNF    Equipment Recommendations  None recommended by PT    Recommendations for Other Services       Precautions / Restrictions Precautions Precautions: Fall Restrictions Weight Bearing Restrictions: Yes RLE Weight Bearing: Non weight bearing      Mobility  Bed Mobility Overal bed mobility: Needs Assistance Bed Mobility: Supine to Sit     Supine to sit: Min guard Sit to supine: Min guard   General bed mobility comments: pt utilizes bed rails and HOB elevated in order to complete without A.    Transfers Overall transfer level: Needs assistance Equipment used: Rolling walker (2 wheeled) Transfers: Sit to/from [facility] Sit to Stand: Min assist Stand pivot transfers: Min assist       General transfer comment: A to power up to stand and for balance during pivot.  cueing for safe use of RW and use of UEs when coming to sit.    Ambulation/Gait                Stairs            Wheelchair Mobility    Modified Rankin (Stroke Patients Only)       Balance Overall balance assessment: Needs assistance Sitting-balance support: No upper extremity supported;Feet supported Sitting balance-Leahy Scale: Good     Standing balance support: Bilateral upper extremity supported Standing balance-Leahy Scale: Poor                               Pertinent  Vitals/Pain Pain Assessment: No/denies pain    Home Living Family/patient expects to be discharged to:: Skilled nursing facility Living Arrangements: Alone                    Prior Function Level of Independence: Independent               Hand Dominance   Dominant Hand: Right    Extremity/Trunk Assessment   Upper Extremity Assessment: Defer to OT evaluation           Lower Extremity Assessment: Generalized weakness;RLE deficits/detail RLE Deficits / Details: Generally weak and casted ankle/foot.      Cervical / Trunk Assessment: Normal  Communication   Communication: No difficulties  Cognition Arousal/Alertness: Awake/alert Behavior During Therapy: WFL for tasks assessed/performed Overall Cognitive Status: Within Functional Limits for tasks assessed                      General Comments      Exercises        Assessment/Plan    PT Assessment Patient needs continued PT services  PT Diagnosis Difficulty walking;Generalized weakness   PT Problem List Decreased strength;Decreased activity tolerance;Decreased balance;Decreased mobility;Decreased knowledge of use of DME  PT Treatment Interventions DME instruction;Gait training;Functional mobility training;Therapeutic activities;Therapeutic exercise;Balance training;Patient/family education  PT Goals (Current goals can be found in the Care Plan section) Acute Rehab PT Goals Patient Stated Goal: to rehab then home PT Goal Formulation: With patient Time For Goal Achievement: 11/18/13 Potential to Achieve Goals: Good    Frequency Min 3X/week   Barriers to discharge        Co-evaluation               End of Session Equipment Utilized During Treatment: Gait belt Activity Tolerance: Patient tolerated treatment well Patient left: in chair;with call bell/phone within reach Nurse Communication: Mobility status         Time: 8891-6945 PT Time Calculation (min): 28 min   Charges:    PT Evaluation $Initial PT Evaluation Tier I: 1 Procedure PT Treatments $Therapeutic Activity: 8-22 mins   PT G CodesCatarina Hartshorn, [place] 11/04/2013, 2:47 PM

## 2013-11-04 NOTE — Evaluation (Signed)
Occupational Therapy Evaluation and Discharge Patient Details Name: Miranda Wilkinson MRN: [id number] DOB: [birth year] Today's Date: 11/04/2013    History of Present Illness 78 y/o female with right fibula and tibial pilon fracture after a [place] the beach last [place] and now s/p ORIF right tibial pilon and fibula fractures   Clinical Impression   This 78 yo female admitted and underwent above presents to acute OT with decreased balance, decreased mobility, obesity, and NWB'ing RLE all affecting pt's ability to care for herself. Pt will benefit from continued OT at SNF. Acute OT will sign off.    Follow Up Recommendations  SNF    Equipment Recommendations   (TBD at next venue)       Precautions / Restrictions Precautions Precautions: Fall Restrictions Weight Bearing Restrictions: Yes RLE Weight Bearing: Non weight bearing      Mobility Bed Mobility Overal bed mobility: Needs Assistance Bed Mobility: Sit to Supine       Sit to supine: Min guard      Transfers Overall transfer level: Needs assistance Equipment used: Rolling walker (2 wheeled) Transfers: Sit to/from Stand Sit to Stand: Min assist              Balance Overall balance assessment: Needs assistance Sitting-balance support: No upper extremity supported;Feet supported Sitting balance-Leahy Scale: Good     Standing balance support: Bilateral upper extremity supported Standing balance-Leahy Scale: Poor                              ADL Overall ADL's : Needs assistance/impaired Eating/Feeding: Set up;Sitting   Grooming: Set up;Sitting   Upper Body Bathing: Set up;Sitting   Lower Body Bathing: Moderate assistance;Sit to/from stand   Upper Body Dressing : Set up;Sitting   Lower Body Dressing: Moderate assistance;Sit to/from stand   Toilet Transfer: Minimal assistance;Stand-pivot;RW (recliner to bed)   Toileting- Clothing Manipulation and Hygiene: Moderate assistance;Sit to/from  stand                         Pertinent Vitals/Pain Pain Assessment: No/denies pain     Hand Dominance Right   Extremity/Trunk Assessment Upper Extremity Assessment Upper Extremity Assessment: Generalized weakness           Communication Communication Communication: No difficulties   Cognition Arousal/Alertness: Awake/alert Behavior During Therapy: WFL for tasks assessed/performed Overall Cognitive Status: Within Functional Limits for tasks assessed                                Home Living Family/patient expects to be discharged to:: Skilled nursing facility Living Arrangements: Alone                                      Prior Functioning/Environment Level of Independence: Independent                OT Problem List: Decreased strength;Decreased range of motion;Impaired balance (sitting and/or standing);Obesity;Decreased knowledge of use of DME or AE      OT Goals(Current goals can be found in the care plan section) Acute Rehab OT Goals Patient Stated Goal: to rehab then home  OT Frequency:                End of Session Equipment Utilized During Treatment: Gait  belt;Rolling walker  Activity Tolerance: Patient tolerated treatment well Patient left: in bed;with call bell/phone within reach   Time: 7628-3151 OT Time Calculation (min): 9 min Charges:  OT General Charges $OT Visit: 1 Procedure OT Evaluation $Initial OT Evaluation Tier I: 1 Procedure  Almon Register [phone] 11/04/2013, 2:28 PM

## 2013-11-05 LAB — GLUCOSE, CAPILLARY
Glucose-Capillary: 97 mg/dL (ref 70–99)
Glucose-Capillary: 85 mg/dL (ref 70–99)
Glucose-Capillary: 106 mg/dL — ABNORMAL HIGH (ref 70–99)

## 2013-11-05 NOTE — Progress Notes (Signed)
11/05/13 0641am CBG = 121, glucometer not transferring data to EPIC.

## 2013-11-05 NOTE — Progress Notes (Signed)
Clinical Social Work Department BRIEF PSYCHOSOCIAL ASSESSMENT 11/05/2013  Patient:  Miranda Wilkinson, Miranda Wilkinson     Account Number:  [id number]     Admit date:  11/03/2013  Clinical Social Worker:  Rolinda Roan  Date/Time:  11/05/2013 05:25 PM  Referred by:  Physician  Date Referred:  11/03/2013 Referred for  SNF Placement   Other Referral:   Interview type:  Patient Other interview type:    PSYCHOSOCIAL DATA Living Status:  ALONE Admitted from facility:   Level of care:   Primary support name:  Catalina Antigua [phone] Primary support relationship to patient:  CHILD, ADULT Degree of support available:   Strong support at bedside.    CURRENT CONCERNS  Other Concerns:    SOCIAL WORK ASSESSMENT / PLAN Clinical Social Worker (CSW) met with patient and her 2 daughters Lenna Sciara and Debroah Baller were at bedside. CSW introduced self and explained role of CSW department. Patient reported that she is from [place] and lives alone. Daughter Lenna Sciara reported that she lives in [place] and would prefer patient to go to SNF in [place]. Patient is agreeable to SNF search in [facility] and prefers [facility]. Patient is agreeable to [facility] as second choice.   Assessment/plan status:  Psychosocial Support/Ongoing Assessment of Needs Other assessment/ plan:   Information/referral to community resources:   CSW gave daughter [facility] SNF list and encouraged her to view medicare.gov for ratings and reviews of SNF's.    PATIENT'S/FAMILY'S RESPONSE TO PLAN OF CARE:

## 2013-11-05 NOTE — Progress Notes (Signed)
Clinical Social Work Department CLINICAL SOCIAL WORK PLACEMENT NOTE 11/05/2013  Patient:  Miranda Wilkinson, Miranda Wilkinson  Account Number:  [id number] Admit date:  11/03/2013  Clinical Social Worker:  Blima Rich, Latanya Presser  Date/time:  11/05/2013 05:22 PM  Clinical Social Work is seeking post-discharge placement for this patient at the following level of care:   SKILLED NURSING   (*CSW will update this form in Epic as items are completed)   11/05/2013  Patient/family provided with [facility] Department of Clinical Social Work's list of facilities offering this level of care within the geographic area requested by the patient (or if unable, by the patient's family).  11/05/2013  Patient/family informed of their freedom to choose among providers that offer the needed level of care, that participate in Medicare, Medicaid or managed care program needed by the patient, have an available bed and are willing to accept the patient.  11/05/2013  Patient/family informed of [facility]' ownership interest in [facility], as well as of the fact that they are under no obligation to receive care at this facility.  PASARR submitted to EDS on  PASARR number received on   FL2 transmitted to all facilities in geographic area requested by pt/family on  11/05/2013 FL2 transmitted to all facilities within larger geographic area on   Patient informed that his/her managed care company has contracts with or will negotiate with  certain facilities, including the following:     Patient/family informed of bed offers received:   Patient chooses bed at  Physician recommends and patient chooses bed at    Patient to be transferred to  on   Patient to be transferred to facility by  Patient and family notified of transfer on  Name of family member notified:    The following physician request were entered in Epic:   Additional Comments: Patient has an existing PASARR however the information is incorrect. CSW could  not look up PASARR in [employer].

## 2013-11-05 NOTE — Progress Notes (Signed)
Subjective: 2 Days Post-Op Procedure(s) (LRB): OPEN REDUCTION INTERNAL FIXATION (ORIF) RIGHT TIBIA/FIBULA PILON FRACTURE (Right) Patient reports pain as mild.  Pain well controlled. Denies numbness or tingling, calf pain, N/V, CP, SOB. Notes urinary frequency which she states is normal for her.   Objective: Vital signs in last 24 hours: Temp:  [98 F (36.7 C)-99.1 F (37.3 C)] 98.5 F (36.9 C) (08/29 0527) Pulse Rate:  [65-81] 81 (08/29 0527) Resp:  [16] 16 (08/29 0527) BP: (107-147)/(34-54) 147/54 mmHg (08/29 0527) SpO2:  [90 %-98 %] 95 % (08/29 0527)  Intake/Output from previous day: 08/28 0701 - 08/29 0700 In: 640 [P.O.:640] Out: 100 [Urine:100] Intake/Output this shift:     Recent Labs  11/02/13 1358  HGB 13.6    Recent Labs  11/02/13 1358  WBC 9.5  RBC 4.37  HCT 40.1  PLT 375    Recent Labs  11/02/13 1358  NA 140  K 3.7  CL 99  CO2 26  BUN 14  CREATININE 0.86  GLUCOSE 101*  CALCIUM 10.5   No results found for this basename: LABPT, INR,  in the last 72 hours  Neurologically intact ABD soft Neurovascular intact Sensation intact distally Intact pulses distally Dorsiflexion/Plantar flexion intact Incision: dressing C/D/I and no drainage No cellulitis present Compartment soft no sign of DVT  Assessment/Plan: 2 Days Post-Op Procedure(s) (LRB): OPEN REDUCTION INTERNAL FIXATION (ORIF) RIGHT TIBIA/FIBULA PILON FRACTURE (Right) Advance diet Up with therapy D/C IV fluids Continue NWB RLE Continue ice and elevation RLE to reduce swelling Lovenox for DVT ppx inpatient, plan ASA upon D/C Pending SW consult for placement at SNF Plan D/C Monday to SNF/rehab  Jobani Sabado M. 11/05/2013, 8:34 AM

## 2013-11-06 ENCOUNTER — Inpatient Hospital Stay (HOSPITAL_COMMUNITY): Payer: Medicare Other

## 2013-11-06 LAB — GLUCOSE, CAPILLARY
Glucose-Capillary: 103 mg/dL — ABNORMAL HIGH (ref 70–99)
Glucose-Capillary: 95 mg/dL (ref 70–99)
Glucose-Capillary: 95 mg/dL (ref 70–99)
Glucose-Capillary: 109 mg/dL — ABNORMAL HIGH (ref 70–99)

## 2013-11-06 NOTE — Progress Notes (Signed)
Subjective: 3 Days Post-Op Procedure(s) (LRB): OPEN REDUCTION INTERNAL FIXATION (ORIF) RIGHT TIBIA/FIBULA PILON FRACTURE (Right) Patient reports pain as 1 on 0-10 scale.Circulation and sensation intact in toes. No major problem this morning.    Objective: Vital signs in last 24 hours: Temp:  [98.2 F (36.8 C)-99.1 F (37.3 C)] 98.6 F (37 C) (08/30 0606) Pulse Rate:  [67-71] 67 (08/30 0606) Resp:  [16] 16 (08/30 0606) BP: (113-133)/(44-69) 131/45 mmHg (08/30 0606) SpO2:  [94 %-100 %] 95 % (08/30 0606)  Intake/Output from previous day:   Intake/Output this shift:    No results found for this basename: HGB,  in the last 72 hours No results found for this basename: WBC, RBC, HCT, PLT,  in the last 72 hours No results found for this basename: NA, K, CL, CO2, BUN, CREATININE, GLUCOSE, CALCIUM,  in the last 72 hours No results found for this basename: LABPT, INR,  in the last 72 hours  Neurologically intact  Assessment/Plan: 3 Days Post-Op Procedure(s) (LRB): OPEN REDUCTION INTERNAL FIXATION (ORIF) RIGHT TIBIA/FIBULA PILON FRACTURE (Right) Discharge to SNF  Miranda Wilkinson A 11/06/2013, 8:25 AM

## 2013-11-07 DIAGNOSIS — E039 Hypothyroidism, unspecified: Secondary | ICD-10-CM | POA: Diagnosis not present

## 2013-11-07 DIAGNOSIS — E119 Type 2 diabetes mellitus without complications: Secondary | ICD-10-CM | POA: Diagnosis not present

## 2013-11-07 DIAGNOSIS — S8290XS Unspecified fracture of unspecified lower leg, sequela: Secondary | ICD-10-CM | POA: Diagnosis not present

## 2013-11-07 DIAGNOSIS — K219 Gastro-esophageal reflux disease without esophagitis: Secondary | ICD-10-CM | POA: Diagnosis not present

## 2013-11-07 DIAGNOSIS — I10 Essential (primary) hypertension: Secondary | ICD-10-CM | POA: Diagnosis not present

## 2013-11-07 DIAGNOSIS — R51 Headache: Secondary | ICD-10-CM | POA: Diagnosis not present

## 2013-11-07 DIAGNOSIS — Z9181 History of falling: Secondary | ICD-10-CM | POA: Diagnosis not present

## 2013-11-07 DIAGNOSIS — S8290XD Unspecified fracture of unspecified lower leg, subsequent encounter for closed fracture with routine healing: Secondary | ICD-10-CM | POA: Diagnosis not present

## 2013-11-07 DIAGNOSIS — M6281 Muscle weakness (generalized): Secondary | ICD-10-CM | POA: Diagnosis not present

## 2013-11-07 DIAGNOSIS — R269 Unspecified abnormalities of gait and mobility: Secondary | ICD-10-CM | POA: Diagnosis not present

## 2013-11-07 LAB — GLUCOSE, CAPILLARY
Glucose-Capillary: 121 mg/dL — ABNORMAL HIGH (ref 70–99)
Glucose-Capillary: 117 mg/dL — ABNORMAL HIGH (ref 70–99)
Glucose-Capillary: 93 mg/dL (ref 70–99)
Glucose-Capillary: 77 mg/dL (ref 70–99)

## 2013-11-07 MED ORDER — OXYCODONE HCL 5 MG PO TABS: 5.0000 mg | ORAL_TABLET | ORAL | Status: DC | PRN

## 2013-11-07 NOTE — Discharge Summary (Signed)
  Physician Discharge Summary  Patient ID: [name] MRN: [id number] DOB/AGE: 08/11/1935 78 y.o.  Admit date: 11/03/2013 Discharge date: 11/07/2013  Admission Diagnoses:  Right fibula and tibial pilon fracture Discharge Diagnoses:  Active Problems:   Closed right ankle fracture   Discharged Condition: good  Hospital Course: Miranda Wilkinson suffered a fall while vacationing at the beach during the week of 8/17. She was found to have a closed ankle fracture and presented to [facility] on 8/27 for ORIF of her right fibula and tibial pilon fractures. She tolerated the procedure well and had no immediate complications. Both she and her daughter demonstrated interest in SNF placement prior to and after her surgery, as she lives alone and would have difficulty caring for herself. She did well over the weekend and has no new complaints at this time. She reports mild pain of her RLE, and that it is well controlled with pain medications. She denies any new HA, CP, SOB, N,V, D, fever, chills, calf swelling or pain. She was seen by SW over the weekend and is ready for SNF placement.   Consults: None  Significant Diagnostic Studies: radiology: X-Ray: Closed right fibula and tibial pilon fractures  Treatments: surgery: ORIF R ankle fractures 8/27  Discharge Exam: Blood pressure 131/46, pulse 66, temperature 98.6 F (37 C), temperature source Oral, resp. rate 16, height 5\' 5"  (1.651 m), weight 93.951 kg (207 lb 2 oz), SpO2 97.00%. WD/WN female in nad. A and O x4. Mood and affect appropriate. EOMI. Respirations normal and unloabored. CPAP in place for sleep apnea treatment. RLE in hard splint. Dressings C/D/I. NV intact with brisk capillary refill. 5/5 strenght of toe extensors and flexors. No calf swelling, tenderness or palpable cords. no lymphadenopathy.   Disposition: SNF placement    Medication List    ASK your doctor about these medications       citalopram 20 MG tablet  Commonly known as:  CELEXA   Take 20 mg by mouth at bedtime.     glipiZIDE 5 MG tablet  Commonly known as:  GLUCOTROL  Take 5 mg by mouth daily before breakfast.     levothyroxine 88 MCG tablet  Commonly known as:  SYNTHROID, LEVOTHROID  Take 88 mcg by mouth daily before breakfast.     losartan-hydrochlorothiazide 100-12.5 MG per tablet  Commonly known as:  HYZAAR  Take 1 tablet by mouth daily.     metoprolol succinate 50 MG 24 hr tablet  Commonly known as:  TOPROL-XL  Take 50 mg by mouth daily. Take with or immediately following a meal.     simvastatin 20 MG tablet  Commonly known as:  ZOCOR  Take 20 mg by mouth daily.           Follow-up Information   Follow up with Wylene Simmer, MD In 2 weeks. (For suture removal)    Specialty:  Orthopedic Surgery   Contact information:   [address] [place] [address] [phone]     D/C to SNF NWB RLE, up with PT ASA 325mg  daily for DVT prophylaxis PO narcotics for pain control   Signed: Ventura Hollenbeck HOWELLS 11/07/2013, 7:39 AM

## 2013-11-07 NOTE — Progress Notes (Signed)
Rt checked in on pt and found everything removed from it.  Rt removed cpap at this time.  RT will recheck for pt later this evening.

## 2013-11-07 NOTE — Progress Notes (Signed)
Patient for discharge to SNF bed at [facility] today. Plans confirmed with patient and family who are in agreement. SNF is prepared for patient and we will  plan transfer via PTAR. Pt packet prepared.   Charlene Brooke, MSW Clinical Social Worker [phone]

## 2013-11-07 NOTE — Discharge Instructions (Signed)
Miranda Hewitt, MD °[facility] ° °Please read the following information regarding your care after surgery. ° °Medications  °You only need a prescription for the narcotic pain medicine (ex. oxycodone, Percocet, Norco).  All of the other medicines listed below are available over the counter. °X acetominophen (Tylenol) 650 mg every 4-6 hours as you need for minor pain °X oxycodone as prescribed for moderate to severe pain °?  ° °Narcotic pain medicine (ex. oxycodone, Percocet, Vicodin) will cause constipation.  To prevent this problem, take the following medicines while you are taking any pain medicine. °X docusate sodium (Colace) 100 mg twice a day X senna (Senokot) 2 tablets twice a day ° °X To help prevent blood clots, take an aspirin (325 mg) once a day for a month after surgery.  You should also get up every hour while you are awake to move around.   ° °Weight Bearing °X Do not bear any weight on the operated leg or foot. ° °Cast / Splint / Dressing °X Keep your splint or cast clean and dry.  Don’t put anything (coat hanger, pencil, etc) down inside of it.  If it gets damp, use a hair dryer on the cool setting to dry it.  If it gets soaked, call the office to schedule an appointment for a cast change. °  ° °After your dressing, cast or splint is removed; you may shower, but do not soak or scrub the wound.  Allow the water to run over it, and then gently pat it dry. ° °Swelling °It is normal for you to have swelling where you had surgery.  To reduce swelling and pain, keep your toes above your nose for at least 3 days after surgery.  It may be necessary to keep your foot or leg elevated for several weeks.  If it hurts, it should be elevated. ° °Follow Up °Call my office at [phone] when you are discharged from the hospital or surgery center to schedule an appointment to be seen two weeks after surgery. ° °Call my office at [phone] if you develop a fever >101.5° F, nausea, vomiting, bleeding from  the surgical site or severe pain.   ° ° °

## 2013-11-08 ENCOUNTER — Non-Acute Institutional Stay (SKILLED_NURSING_FACILITY): Payer: Medicare Other | Admitting: Internal Medicine

## 2013-11-08 DIAGNOSIS — E039 Hypothyroidism, unspecified: Secondary | ICD-10-CM

## 2013-11-08 DIAGNOSIS — I10 Essential (primary) hypertension: Secondary | ICD-10-CM | POA: Diagnosis not present

## 2013-11-08 DIAGNOSIS — E119 Type 2 diabetes mellitus without complications: Secondary | ICD-10-CM | POA: Diagnosis not present

## 2013-11-08 DIAGNOSIS — S8290XS Unspecified fracture of unspecified lower leg, sequela: Secondary | ICD-10-CM

## 2013-11-08 DIAGNOSIS — S82891S Other fracture of right lower leg, sequela: Secondary | ICD-10-CM

## 2013-11-08 NOTE — Progress Notes (Signed)
HISTORY & PHYSICAL  DATE: 11/08/2013   FACILITY: [facility]  LEVEL OF CARE: SNF (31)  ALLERGIES:  No Known Allergies  CHIEF COMPLAINT:  Manage right ankle fracture, diabetes mellitus and hypothyroidism  HISTORY OF PRESENT ILLNESS: Patient is a 78 year old Caucasian female.  ANKLE FRACTURE: The patient had a mechanical fall and sustained an ankle fracture. Patient underwent surgical repair and tolerated the procedure well. Patient is admitted to this facility for short-term rehabilitation. The patient denies ongoing pain. No complications reported from the current medication(s) being used. Patient had right fibula and tibial pilon fractures.  DM:pt's DM remains stable.  Pt denies polyuria, polydipsia, polyphagia, changes in vision or hypoglycemic episodes.  No complications noted from the medication presently being used.  Last hemoglobin A1c is: Not available.  HYPOTHYROIDISM: The hypothyroidism remains stable. No complications noted from the medications presently being used.  The patient denies fatigue or constipation.  Last TSH not available.  PAST MEDICAL HISTORY :  Past Medical History  Diagnosis Date  . Hypertension   . Sleep apnea     study done in [place], [place], use CPAP  . Shortness of breath   . Sinus congestion   . Hypothyroidism   . Diabetes mellitus without complication   . Anxiety   . GERD (gastroesophageal reflux disease)   . Headache(784.0)     hx migraines  . 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 Thyroid exploration    . Appendectomy    . Cholecystectomy    . Tubal ligation    . Ovarian cyst surgery Left     age 78  . Open reduction internal fixation (orif) tibia/fibula fracture Right 11/03/2013    Procedure: OPEN REDUCTION INTERNAL FIXATION (ORIF) RIGHT TIBIA/FIBULA PILON FRACTURE;  Surgeon: Wylene Simmer, MD;  Location: [facility];  Service: Orthopedics;   Laterality: Right;    SOCIAL HISTORY:  reports that she quit smoking about 2 years ago. Her smoking use included Cigarettes. She has a 90 pack-year smoking history. She has never used smokeless tobacco. She reports that she does not drink alcohol or use illicit drugs.  FAMILY HISTORY: None  CURRENT MEDICATIONS: Reviewed per MAR/see medication list  REVIEW OF SYSTEMS:  See HPI otherwise 14 point ROS is negative.  PHYSICAL EXAMINATION  VS:  See VS section  GENERAL: no acute distress, obese body habitus EYES: conjunctivae normal, sclerae normal, normal eye lids MOUTH/THROAT: lips without lesions,no lesions in the mouth,tongue is without lesions,uvula elevates in midline NECK: supple, trachea midline, no neck masses, no thyroid tenderness, no thyromegaly LYMPHATICS: no LAN in the neck, no supraclavicular LAN RESPIRATORY: breathing is even & unlabored, BS CTAB CARDIAC: RRR, no murmur,no extra heart sounds, no edema GI:  ABDOMEN: abdomen soft, normal BS, no masses, no tenderness  LIVER/SPLEEN: no hepatomegaly, no splenomegaly MUSCULOSKELETAL: HEAD: normal to inspection  EXTREMITIES: LEFT UPPER EXTREMITY: full range of motion, normal strength & tone RIGHT UPPER EXTREMITY:  full range of motion, normal strength & tone LEFT LOWER EXTREMITY:  full range of motion, normal strength & tone RIGHT LOWER EXTREMITY: Not tested-has cast PSYCHIATRIC: the patient is alert & oriented to person, affect & behavior appropriate  LABS/RADIOLOGY:  Labs reviewed: Basic Metabolic Panel:  Recent Labs  11/02/13 1358  NA 140  K 3.7  CL 99  CO2 26  GLUCOSE 101*  BUN 14  CREATININE 0.86  CALCIUM 10.5   CBC:  Recent Labs  11/02/13 1358  WBC 9.5  HGB 13.6  HCT 40.1  MCV 91.8  PLT 375   CBG:  Recent Labs  11/07/13 0623 11/07/13 1127 11/07/13 1637  GLUCAP 117* 93 77    EXAM: CT OF THE RIGHT ANKLE WITHOUT CONTRAST   TECHNIQUE: Multidetector CT imaging was performed according to  the standard protocol. Multiplanar CT image reconstructions were also generated.   COMPARISON:  None.   FINDINGS: There is a fracture of the posterior malleolus. The fracture originates in the posterior cortex 4 cm above the tibial plafond. The fracture fragment is triangular in shape at the tibial plafond measuring 3.4 cm transverse by 1.8 cm laterally. The fracture fragment is distracted approximately 0.6 cm and superiorly displaced 0.2 cm. A tiny chip fracture is also seen off the central aspect of the tibia at the lateral malleolus.   The patient also has a medial malleolar fracture. This fracture is mildly comminuted but only minimally displaced. A fragment of the central and medial plafond measuring 0.8 cm transverse by a 0.6 cm AP is mildly impacted.   The patient has a fracture of the distal fibula. This fracture is oblique in orientation beginning 5.7 cm above the tip of the distal fibula and extending in an anterior and inferior orientation through the anterior cortex 2 cm above the tip of the fibula. The fracture fragment is posteriorly displaced 0.8 cm.   2-3 tiny bony fragments are identified within the tibiotalar joint anteriorly and medially. Contusions swelling about the ankle is identified. No tendon entrapment is seen.   IMPRESSION: Trimalleolar fracture as described.     EXAM: CHEST  2 VIEW   COMPARISON:  None.   FINDINGS: Mediastinum and hilar structures are normal. Subsegmental atelectasis and/or scarring left lung base. No pleural effusion or pneumothorax. Borderline cardiomegaly. Normal pulmonary vascularity. No acute bony abnormality. Degenerative changes both shoulders and thoracic spine. Old right posterior lateral right rib fractures present.   IMPRESSION: Mild left base subsegmental atelectasis and or scarring. No acute cardiopulmonary disease otherwise noted. EXAM: LUMBAR SPINE - 2-3 VIEW   COMPARISON:  None.   FINDINGS: Early  degenerative disc disease changes from L3-4 through L5-S1. Early vacuum disc at L3-4 and L4-5. Slight disc space narrowing and spurring. Normal alignment. No fracture. Mild sclerosis around the SI joints suggesting bilateral sacroiliitis.   IMPRESSION: No acute bony abnormality.   Probable bilateral sacroiliitis.   Early degenerative disc disease in the lower lumbar spine.   EXAM: PELVIS - 1-2 VIEW   COMPARISON:  None.   FINDINGS: There is no evidence of pelvic fracture or diastasis. No other pelvic bone lesions are seen.   Mild degenerative changes in the hips and lower lumbar spine identified.   IMPRESSION: No evidence of acute bony abnormality.   Mild degenerative changes in both hips and lower lumbar spine.    ASSESSMENT/PLAN:  Right ankle fracture-status post ORIF. Continue rehabilitation. Diabetes mellitus-continue glipizide Hypothyroidism-continue levothyroxine Hypertension-well-controlled Hyperlipidemia-continue Zocor Depression-continue celexa  I have reviewed patient's medical records received at admission/from hospitalization.  CPT CODE: 10932  Gayani Y Dasanayaka, [place] [phone]

## 2013-11-18 DIAGNOSIS — S8290XD Unspecified fracture of unspecified lower leg, subsequent encounter for closed fracture with routine healing: Secondary | ICD-10-CM | POA: Diagnosis not present

## 2013-11-22 ENCOUNTER — Encounter: Payer: Self-pay | Admitting: Adult Health

## 2013-11-22 ENCOUNTER — Non-Acute Institutional Stay (SKILLED_NURSING_FACILITY): Payer: Medicare Other | Admitting: Adult Health

## 2013-11-22 DIAGNOSIS — E039 Hypothyroidism, unspecified: Secondary | ICD-10-CM | POA: Diagnosis not present

## 2013-11-22 DIAGNOSIS — E782 Mixed hyperlipidemia: Secondary | ICD-10-CM | POA: Insufficient documentation

## 2013-11-22 DIAGNOSIS — E119 Type 2 diabetes mellitus without complications: Secondary | ICD-10-CM | POA: Diagnosis not present

## 2013-11-22 DIAGNOSIS — F32A Depression, unspecified: Secondary | ICD-10-CM | POA: Insufficient documentation

## 2013-11-22 DIAGNOSIS — S82891D Other fracture of right lower leg, subsequent encounter for closed fracture with routine healing: Secondary | ICD-10-CM

## 2013-11-22 DIAGNOSIS — E785 Hyperlipidemia, unspecified: Secondary | ICD-10-CM

## 2013-11-22 DIAGNOSIS — F329 Major depressive disorder, single episode, unspecified: Secondary | ICD-10-CM

## 2013-11-22 DIAGNOSIS — I10 Essential (primary) hypertension: Secondary | ICD-10-CM

## 2013-11-22 DIAGNOSIS — S8290XD Unspecified fracture of unspecified lower leg, subsequent encounter for closed fracture with routine healing: Secondary | ICD-10-CM

## 2013-11-22 DIAGNOSIS — J309 Allergic rhinitis, unspecified: Secondary | ICD-10-CM

## 2013-11-22 DIAGNOSIS — F3289 Other specified depressive episodes: Secondary | ICD-10-CM

## 2013-11-22 NOTE — Progress Notes (Signed)
Patient ID: Miranda Wilkinson, female   DOB: 1935/06/02, 78 y.o.   MRN: [id number]              PROGRESS NOTE  DATE: 11/22/2013   FACILITY: [facility]  LEVEL OF CARE: SNF (31)  Acute Visit  CHIEF COMPLAINT:  Discharge Notes  HISTORY OF PRESENT ILLNESS: This is a 78 year old female who is for discharge home with home health PT, OT and nursing. She was admitted to [facility] on 11/07/13 from [facility] with close right ankle fracture status post ORIF. Patient was admitted to this facility for short-term rehabilitation after the patient's recent hospitalization.  Patient has completed SNF rehabilitation and therapy has cleared the patient for discharge.  Reassessment of ongoing problem(s):  HTN: Pt 's HTN remains stable.  Denies CP, sob, DOE, pedal edema, headaches, dizziness or visual disturbances.  No complications from the medications currently being used.  Last BP : 125/65  DM:pt's DM remains stable.  Pt denies polyuria, polydipsia, polyphagia, changes in vision or hypoglycemic episodes.  No complications noted from the medication presently being used.  Last hemoglobin A1c is: Not available  DEPRESSION: The depression remains stable. Patient denies ongoing feelings of sadness, insomnia, anedhonia or lack of appetite. No complications reported from the medications currently being used. Staff do not report behavioral problems.   PAST MEDICAL HISTORY : Reviewed.  No changes/see problem list  CURRENT MEDICATIONS: Reviewed per MAR/see medication list  REVIEW OF SYSTEMS:  GENERAL: no change in appetite, no fatigue, no weight changes, no fever, chills or weakness RESPIRATORY: no cough, SOB, DOE, wheezing, hemoptysis CARDIAC: no chest pain, edema or palpitations GI: no abdominal pain, diarrhea, constipation, heart burn, nausea or vomiting  PHYSICAL EXAMINATION  GENERAL: no acute distress, obese EYES: conjunctivae normal, sclerae normal, normal eye lids NECK:  supple, trachea midline, no neck masses, no thyroid tenderness, no thyromegaly LYMPHATICS: no LAN in the neck, no supraclavicular LAN RESPIRATORY: breathing is even & unlabored, BS CTAB CARDIAC: RRR, no murmur,no extra heart sounds, no edema GI: abdomen soft, normal BS, no masses, no tenderness, no hepatomegaly, no splenomegaly EXTREMITIES:  Able to move all 4 extremities; right foot cast PSYCHIATRIC: the patient is alert & oriented to person, affect & behavior appropriate  LABS/RADIOLOGY: Labs reviewed: Basic Metabolic Panel:  Recent Labs  11/02/13 1358  NA 140  K 3.7  CL 99  CO2 26  GLUCOSE 101*  BUN 14  CREATININE 0.86  CALCIUM 10.5   CBC:  Recent Labs  11/02/13 1358  WBC 9.5  HGB 13.6  HCT 40.1  MCV 91.8  PLT 375   CBG:  Recent Labs  11/07/13 0623 11/07/13 1127 11/07/13 1637  GLUCAP 117* 93 77    EXAM: CT OF THE RIGHT ANKLE WITHOUT CONTRAST   TECHNIQUE: Multidetector CT imaging was performed according to the standard protocol. Multiplanar CT image reconstructions were also generated.   COMPARISON:  None.   FINDINGS: There is a fracture of the posterior malleolus. The fracture originates in the posterior cortex 4 cm above the tibial plafond. The fracture fragment is triangular in shape at the tibial plafond measuring 3.4 cm transverse by 1.8 cm laterally. The fracture fragment is distracted approximately 0.6 cm and superiorly displaced 0.2 cm. A tiny chip fracture is also seen off the central aspect of the tibia at the lateral malleolus.   The patient also has a medial malleolar fracture. This fracture is mildly comminuted but only minimally displaced. A fragment  of the central and medial plafond measuring 0.8 cm transverse by a 0.6 cm AP is mildly impacted.   The patient has a fracture of the distal fibula. This fracture is oblique in orientation beginning 5.7 cm above the tip of the distal fibula and extending in an anterior and inferior  orientation through the anterior cortex 2 cm above the tip of the fibula. The fracture fragment is posteriorly displaced 0.8 cm.   2-3 tiny bony fragments are identified within the tibiotalar joint anteriorly and medially. Contusions swelling about the ankle is identified. No tendon entrapment is seen.   IMPRESSION: Trimalleolar fracture as described. EXAM: CHEST  2 VIEW   COMPARISON:  None.   FINDINGS: Mediastinum and hilar structures are normal. Subsegmental atelectasis and/or scarring left lung base. No pleural effusion or pneumothorax. Borderline cardiomegaly. Normal pulmonary vascularity. No acute bony abnormality. Degenerative changes both shoulders and thoracic spine. Old right posterior lateral right rib fractures present.   IMPRESSION: Mild left base subsegmental atelectasis and or scarring. No acute cardiopulmonary disease otherwise noted. EXAM: LUMBAR SPINE - 2-3 VIEW   COMPARISON:  None.   FINDINGS: Early degenerative disc disease changes from L3-4 through L5-S1. Early vacuum disc at L3-4 and L4-5. Slight disc space narrowing and spurring. Normal alignment. No fracture. Mild sclerosis around the SI joints suggesting bilateral sacroiliitis.   IMPRESSION: No acute bony abnormality.   Probable bilateral sacroiliitis.   Early degenerative disc disease in the lower lumbar spine. EXAM: PELVIS - 1-2 VIEW   COMPARISON:  None.   FINDINGS: There is no evidence of pelvic fracture or diastasis. No other pelvic bone lesions are seen.   Mild degenerative changes in the hips and lower lumbar spine identified.   IMPRESSION: No evidence of acute bony abnormality.   Mild degenerative changes in both hips and lower lumbar spine.    ASSESSMENT/PLAN:  Right ankle fracture status post ORIF - for home health PT, OT and nursing Depression - stable; continue Celexa Diabetes mellitus, type II - continue Glucotrol Hypothyroidism - continue Synthroid Hypertension -  well controlled; continue Hyzaar and Toprol-XL Hyperlipidemia - continue Zocor Allergic rhinitis - continue Zyrtec    I have filled out patient's discharge paperwork and written prescriptions.  Patient will receive home health PT, OT and Nursing.  Total discharge time: Greater than 30 minutes  Discharge time involved coordination of the discharge process with social worker, nursing staff and therapy department. Medical justification for home health services verified.  CPT CODE: 78469   Seth Bake - NP [facility] [phone]

## 2013-11-28 DIAGNOSIS — L89892 Pressure ulcer of other site, stage 2: Secondary | ICD-10-CM | POA: Diagnosis not present

## 2013-11-28 DIAGNOSIS — K219 Gastro-esophageal reflux disease without esophagitis: Secondary | ICD-10-CM | POA: Diagnosis not present

## 2013-11-28 DIAGNOSIS — Z9181 History of falling: Secondary | ICD-10-CM | POA: Diagnosis not present

## 2013-11-28 DIAGNOSIS — I10 Essential (primary) hypertension: Secondary | ICD-10-CM

## 2013-11-28 DIAGNOSIS — IMO0001 Reserved for inherently not codable concepts without codable children: Secondary | ICD-10-CM | POA: Diagnosis not present

## 2013-11-28 DIAGNOSIS — F3289 Other specified depressive episodes: Secondary | ICD-10-CM

## 2013-11-28 DIAGNOSIS — L89512 Pressure ulcer of right ankle, stage 2: Secondary | ICD-10-CM | POA: Diagnosis not present

## 2013-11-28 DIAGNOSIS — E119 Type 2 diabetes mellitus without complications: Secondary | ICD-10-CM | POA: Diagnosis not present

## 2013-11-28 DIAGNOSIS — F329 Major depressive disorder, single episode, unspecified: Secondary | ICD-10-CM

## 2013-11-28 DIAGNOSIS — S82891D Other fracture of right lower leg, subsequent encounter for closed fracture with routine healing: Secondary | ICD-10-CM | POA: Diagnosis not present

## 2013-11-29 DIAGNOSIS — L89892 Pressure ulcer of other site, stage 2: Secondary | ICD-10-CM | POA: Diagnosis not present

## 2013-11-29 DIAGNOSIS — E119 Type 2 diabetes mellitus without complications: Secondary | ICD-10-CM | POA: Diagnosis not present

## 2013-11-29 DIAGNOSIS — I10 Essential (primary) hypertension: Secondary | ICD-10-CM | POA: Diagnosis not present

## 2013-11-29 DIAGNOSIS — L89512 Pressure ulcer of right ankle, stage 2: Secondary | ICD-10-CM | POA: Diagnosis not present

## 2013-11-29 DIAGNOSIS — F329 Major depressive disorder, single episode, unspecified: Secondary | ICD-10-CM | POA: Diagnosis not present

## 2013-11-29 DIAGNOSIS — S82891D Other fracture of right lower leg, subsequent encounter for closed fracture with routine healing: Secondary | ICD-10-CM | POA: Diagnosis not present

## 2013-12-01 DIAGNOSIS — S82891D Other fracture of right lower leg, subsequent encounter for closed fracture with routine healing: Secondary | ICD-10-CM | POA: Diagnosis not present

## 2013-12-01 DIAGNOSIS — E119 Type 2 diabetes mellitus without complications: Secondary | ICD-10-CM | POA: Diagnosis not present

## 2013-12-01 DIAGNOSIS — F329 Major depressive disorder, single episode, unspecified: Secondary | ICD-10-CM | POA: Diagnosis not present

## 2013-12-01 DIAGNOSIS — L89512 Pressure ulcer of right ankle, stage 2: Secondary | ICD-10-CM | POA: Diagnosis not present

## 2013-12-01 DIAGNOSIS — L89892 Pressure ulcer of other site, stage 2: Secondary | ICD-10-CM | POA: Diagnosis not present

## 2013-12-01 DIAGNOSIS — I10 Essential (primary) hypertension: Secondary | ICD-10-CM | POA: Diagnosis not present

## 2013-12-02 DIAGNOSIS — S8290XD Unspecified fracture of unspecified lower leg, subsequent encounter for closed fracture with routine healing: Secondary | ICD-10-CM | POA: Diagnosis not present

## 2013-12-05 DIAGNOSIS — S82891D Other fracture of right lower leg, subsequent encounter for closed fracture with routine healing: Secondary | ICD-10-CM | POA: Diagnosis not present

## 2013-12-05 DIAGNOSIS — L89512 Pressure ulcer of right ankle, stage 2: Secondary | ICD-10-CM | POA: Diagnosis not present

## 2013-12-05 DIAGNOSIS — E119 Type 2 diabetes mellitus without complications: Secondary | ICD-10-CM | POA: Diagnosis not present

## 2013-12-05 DIAGNOSIS — L89892 Pressure ulcer of other site, stage 2: Secondary | ICD-10-CM | POA: Diagnosis not present

## 2013-12-05 DIAGNOSIS — I10 Essential (primary) hypertension: Secondary | ICD-10-CM | POA: Diagnosis not present

## 2013-12-05 DIAGNOSIS — F329 Major depressive disorder, single episode, unspecified: Secondary | ICD-10-CM | POA: Diagnosis not present

## 2013-12-06 DIAGNOSIS — L89892 Pressure ulcer of other site, stage 2: Secondary | ICD-10-CM | POA: Diagnosis not present

## 2013-12-06 DIAGNOSIS — S82891D Other fracture of right lower leg, subsequent encounter for closed fracture with routine healing: Secondary | ICD-10-CM | POA: Diagnosis not present

## 2013-12-06 DIAGNOSIS — F329 Major depressive disorder, single episode, unspecified: Secondary | ICD-10-CM | POA: Diagnosis not present

## 2013-12-06 DIAGNOSIS — E119 Type 2 diabetes mellitus without complications: Secondary | ICD-10-CM | POA: Diagnosis not present

## 2013-12-06 DIAGNOSIS — I10 Essential (primary) hypertension: Secondary | ICD-10-CM | POA: Diagnosis not present

## 2013-12-06 DIAGNOSIS — L89512 Pressure ulcer of right ankle, stage 2: Secondary | ICD-10-CM | POA: Diagnosis not present

## 2013-12-08 DIAGNOSIS — L89892 Pressure ulcer of other site, stage 2: Secondary | ICD-10-CM | POA: Diagnosis not present

## 2013-12-08 DIAGNOSIS — L89512 Pressure ulcer of right ankle, stage 2: Secondary | ICD-10-CM | POA: Diagnosis not present

## 2013-12-08 DIAGNOSIS — E119 Type 2 diabetes mellitus without complications: Secondary | ICD-10-CM | POA: Diagnosis not present

## 2013-12-08 DIAGNOSIS — F329 Major depressive disorder, single episode, unspecified: Secondary | ICD-10-CM | POA: Diagnosis not present

## 2013-12-08 DIAGNOSIS — S82891D Other fracture of right lower leg, subsequent encounter for closed fracture with routine healing: Secondary | ICD-10-CM | POA: Diagnosis not present

## 2013-12-08 DIAGNOSIS — I10 Essential (primary) hypertension: Secondary | ICD-10-CM | POA: Diagnosis not present

## 2013-12-13 DIAGNOSIS — F329 Major depressive disorder, single episode, unspecified: Secondary | ICD-10-CM | POA: Diagnosis not present

## 2013-12-13 DIAGNOSIS — L89892 Pressure ulcer of other site, stage 2: Secondary | ICD-10-CM | POA: Diagnosis not present

## 2013-12-13 DIAGNOSIS — S82891D Other fracture of right lower leg, subsequent encounter for closed fracture with routine healing: Secondary | ICD-10-CM | POA: Diagnosis not present

## 2013-12-13 DIAGNOSIS — I10 Essential (primary) hypertension: Secondary | ICD-10-CM | POA: Diagnosis not present

## 2013-12-13 DIAGNOSIS — E119 Type 2 diabetes mellitus without complications: Secondary | ICD-10-CM | POA: Diagnosis not present

## 2013-12-13 DIAGNOSIS — L89512 Pressure ulcer of right ankle, stage 2: Secondary | ICD-10-CM | POA: Diagnosis not present

## 2013-12-14 DIAGNOSIS — L89512 Pressure ulcer of right ankle, stage 2: Secondary | ICD-10-CM | POA: Diagnosis not present

## 2013-12-14 DIAGNOSIS — S82891D Other fracture of right lower leg, subsequent encounter for closed fracture with routine healing: Secondary | ICD-10-CM | POA: Diagnosis not present

## 2013-12-14 DIAGNOSIS — L89892 Pressure ulcer of other site, stage 2: Secondary | ICD-10-CM | POA: Diagnosis not present

## 2013-12-14 DIAGNOSIS — F329 Major depressive disorder, single episode, unspecified: Secondary | ICD-10-CM | POA: Diagnosis not present

## 2013-12-14 DIAGNOSIS — I10 Essential (primary) hypertension: Secondary | ICD-10-CM | POA: Diagnosis not present

## 2013-12-14 DIAGNOSIS — E119 Type 2 diabetes mellitus without complications: Secondary | ICD-10-CM | POA: Diagnosis not present

## 2013-12-16 DIAGNOSIS — E119 Type 2 diabetes mellitus without complications: Secondary | ICD-10-CM | POA: Diagnosis not present

## 2013-12-16 DIAGNOSIS — L89892 Pressure ulcer of other site, stage 2: Secondary | ICD-10-CM | POA: Diagnosis not present

## 2013-12-16 DIAGNOSIS — I10 Essential (primary) hypertension: Secondary | ICD-10-CM | POA: Diagnosis not present

## 2013-12-16 DIAGNOSIS — S82891D Other fracture of right lower leg, subsequent encounter for closed fracture with routine healing: Secondary | ICD-10-CM | POA: Diagnosis not present

## 2013-12-16 DIAGNOSIS — L89512 Pressure ulcer of right ankle, stage 2: Secondary | ICD-10-CM | POA: Diagnosis not present

## 2013-12-16 DIAGNOSIS — F329 Major depressive disorder, single episode, unspecified: Secondary | ICD-10-CM | POA: Diagnosis not present

## 2013-12-21 DIAGNOSIS — S8261XD Displaced fracture of lateral malleolus of right fibula, subsequent encounter for closed fracture with routine healing: Secondary | ICD-10-CM | POA: Diagnosis not present

## 2013-12-21 DIAGNOSIS — Z23 Encounter for immunization: Secondary | ICD-10-CM | POA: Diagnosis not present

## 2013-12-28 DIAGNOSIS — I10 Essential (primary) hypertension: Secondary | ICD-10-CM | POA: Diagnosis not present

## 2013-12-28 DIAGNOSIS — E119 Type 2 diabetes mellitus without complications: Secondary | ICD-10-CM | POA: Diagnosis not present

## 2013-12-28 DIAGNOSIS — S82891D Other fracture of right lower leg, subsequent encounter for closed fracture with routine healing: Secondary | ICD-10-CM | POA: Diagnosis not present

## 2013-12-28 DIAGNOSIS — L89512 Pressure ulcer of right ankle, stage 2: Secondary | ICD-10-CM | POA: Diagnosis not present

## 2013-12-28 DIAGNOSIS — F329 Major depressive disorder, single episode, unspecified: Secondary | ICD-10-CM | POA: Diagnosis not present

## 2013-12-28 DIAGNOSIS — L89892 Pressure ulcer of other site, stage 2: Secondary | ICD-10-CM | POA: Diagnosis not present

## 2013-12-29 DIAGNOSIS — F329 Major depressive disorder, single episode, unspecified: Secondary | ICD-10-CM | POA: Diagnosis not present

## 2013-12-29 DIAGNOSIS — E119 Type 2 diabetes mellitus without complications: Secondary | ICD-10-CM | POA: Diagnosis not present

## 2013-12-29 DIAGNOSIS — S82891D Other fracture of right lower leg, subsequent encounter for closed fracture with routine healing: Secondary | ICD-10-CM | POA: Diagnosis not present

## 2013-12-29 DIAGNOSIS — I10 Essential (primary) hypertension: Secondary | ICD-10-CM | POA: Diagnosis not present

## 2013-12-29 DIAGNOSIS — L89892 Pressure ulcer of other site, stage 2: Secondary | ICD-10-CM | POA: Diagnosis not present

## 2013-12-29 DIAGNOSIS — L89512 Pressure ulcer of right ankle, stage 2: Secondary | ICD-10-CM | POA: Diagnosis not present

## 2014-01-02 DIAGNOSIS — F329 Major depressive disorder, single episode, unspecified: Secondary | ICD-10-CM | POA: Diagnosis not present

## 2014-01-02 DIAGNOSIS — E119 Type 2 diabetes mellitus without complications: Secondary | ICD-10-CM | POA: Diagnosis not present

## 2014-01-02 DIAGNOSIS — I10 Essential (primary) hypertension: Secondary | ICD-10-CM | POA: Diagnosis not present

## 2014-01-02 DIAGNOSIS — L89512 Pressure ulcer of right ankle, stage 2: Secondary | ICD-10-CM | POA: Diagnosis not present

## 2014-01-02 DIAGNOSIS — L89892 Pressure ulcer of other site, stage 2: Secondary | ICD-10-CM | POA: Diagnosis not present

## 2014-01-02 DIAGNOSIS — S82891D Other fracture of right lower leg, subsequent encounter for closed fracture with routine healing: Secondary | ICD-10-CM | POA: Diagnosis not present

## 2014-01-03 DIAGNOSIS — L89892 Pressure ulcer of other site, stage 2: Secondary | ICD-10-CM | POA: Diagnosis not present

## 2014-01-03 DIAGNOSIS — F329 Major depressive disorder, single episode, unspecified: Secondary | ICD-10-CM | POA: Diagnosis not present

## 2014-01-03 DIAGNOSIS — E119 Type 2 diabetes mellitus without complications: Secondary | ICD-10-CM | POA: Diagnosis not present

## 2014-01-03 DIAGNOSIS — S82891D Other fracture of right lower leg, subsequent encounter for closed fracture with routine healing: Secondary | ICD-10-CM | POA: Diagnosis not present

## 2014-01-03 DIAGNOSIS — I10 Essential (primary) hypertension: Secondary | ICD-10-CM | POA: Diagnosis not present

## 2014-01-03 DIAGNOSIS — L89512 Pressure ulcer of right ankle, stage 2: Secondary | ICD-10-CM | POA: Diagnosis not present

## 2014-01-05 DIAGNOSIS — S82891D Other fracture of right lower leg, subsequent encounter for closed fracture with routine healing: Secondary | ICD-10-CM | POA: Diagnosis not present

## 2014-01-05 DIAGNOSIS — E119 Type 2 diabetes mellitus without complications: Secondary | ICD-10-CM | POA: Diagnosis not present

## 2014-01-05 DIAGNOSIS — L89512 Pressure ulcer of right ankle, stage 2: Secondary | ICD-10-CM | POA: Diagnosis not present

## 2014-01-05 DIAGNOSIS — F329 Major depressive disorder, single episode, unspecified: Secondary | ICD-10-CM | POA: Diagnosis not present

## 2014-01-05 DIAGNOSIS — I10 Essential (primary) hypertension: Secondary | ICD-10-CM | POA: Diagnosis not present

## 2014-01-05 DIAGNOSIS — L89892 Pressure ulcer of other site, stage 2: Secondary | ICD-10-CM | POA: Diagnosis not present

## 2014-01-06 DIAGNOSIS — I10 Essential (primary) hypertension: Secondary | ICD-10-CM | POA: Diagnosis not present

## 2014-01-06 DIAGNOSIS — E119 Type 2 diabetes mellitus without complications: Secondary | ICD-10-CM | POA: Diagnosis not present

## 2014-01-06 DIAGNOSIS — S82891D Other fracture of right lower leg, subsequent encounter for closed fracture with routine healing: Secondary | ICD-10-CM | POA: Diagnosis not present

## 2014-01-06 DIAGNOSIS — L89512 Pressure ulcer of right ankle, stage 2: Secondary | ICD-10-CM | POA: Diagnosis not present

## 2014-01-06 DIAGNOSIS — F329 Major depressive disorder, single episode, unspecified: Secondary | ICD-10-CM | POA: Diagnosis not present

## 2014-01-06 DIAGNOSIS — L89892 Pressure ulcer of other site, stage 2: Secondary | ICD-10-CM | POA: Diagnosis not present

## 2014-01-09 DIAGNOSIS — I10 Essential (primary) hypertension: Secondary | ICD-10-CM | POA: Diagnosis not present

## 2014-01-09 DIAGNOSIS — L89512 Pressure ulcer of right ankle, stage 2: Secondary | ICD-10-CM | POA: Diagnosis not present

## 2014-01-09 DIAGNOSIS — L89892 Pressure ulcer of other site, stage 2: Secondary | ICD-10-CM | POA: Diagnosis not present

## 2014-01-09 DIAGNOSIS — S82891D Other fracture of right lower leg, subsequent encounter for closed fracture with routine healing: Secondary | ICD-10-CM | POA: Diagnosis not present

## 2014-01-09 DIAGNOSIS — E119 Type 2 diabetes mellitus without complications: Secondary | ICD-10-CM | POA: Diagnosis not present

## 2014-01-09 DIAGNOSIS — F329 Major depressive disorder, single episode, unspecified: Secondary | ICD-10-CM | POA: Diagnosis not present

## 2014-01-11 DIAGNOSIS — F329 Major depressive disorder, single episode, unspecified: Secondary | ICD-10-CM | POA: Diagnosis not present

## 2014-01-11 DIAGNOSIS — E119 Type 2 diabetes mellitus without complications: Secondary | ICD-10-CM | POA: Diagnosis not present

## 2014-01-11 DIAGNOSIS — L89892 Pressure ulcer of other site, stage 2: Secondary | ICD-10-CM | POA: Diagnosis not present

## 2014-01-11 DIAGNOSIS — L89512 Pressure ulcer of right ankle, stage 2: Secondary | ICD-10-CM | POA: Diagnosis not present

## 2014-01-11 DIAGNOSIS — S82891D Other fracture of right lower leg, subsequent encounter for closed fracture with routine healing: Secondary | ICD-10-CM | POA: Diagnosis not present

## 2014-01-11 DIAGNOSIS — I10 Essential (primary) hypertension: Secondary | ICD-10-CM | POA: Diagnosis not present

## 2014-01-13 DIAGNOSIS — F329 Major depressive disorder, single episode, unspecified: Secondary | ICD-10-CM | POA: Diagnosis not present

## 2014-01-13 DIAGNOSIS — I10 Essential (primary) hypertension: Secondary | ICD-10-CM | POA: Diagnosis not present

## 2014-01-13 DIAGNOSIS — S82891D Other fracture of right lower leg, subsequent encounter for closed fracture with routine healing: Secondary | ICD-10-CM | POA: Diagnosis not present

## 2014-01-13 DIAGNOSIS — L89512 Pressure ulcer of right ankle, stage 2: Secondary | ICD-10-CM | POA: Diagnosis not present

## 2014-01-13 DIAGNOSIS — E119 Type 2 diabetes mellitus without complications: Secondary | ICD-10-CM | POA: Diagnosis not present

## 2014-01-13 DIAGNOSIS — L89892 Pressure ulcer of other site, stage 2: Secondary | ICD-10-CM | POA: Diagnosis not present

## 2014-01-16 DIAGNOSIS — S82891D Other fracture of right lower leg, subsequent encounter for closed fracture with routine healing: Secondary | ICD-10-CM | POA: Diagnosis not present

## 2014-01-16 DIAGNOSIS — L89512 Pressure ulcer of right ankle, stage 2: Secondary | ICD-10-CM | POA: Diagnosis not present

## 2014-01-16 DIAGNOSIS — F329 Major depressive disorder, single episode, unspecified: Secondary | ICD-10-CM | POA: Diagnosis not present

## 2014-01-16 DIAGNOSIS — E119 Type 2 diabetes mellitus without complications: Secondary | ICD-10-CM | POA: Diagnosis not present

## 2014-01-16 DIAGNOSIS — I10 Essential (primary) hypertension: Secondary | ICD-10-CM | POA: Diagnosis not present

## 2014-01-16 DIAGNOSIS — L89892 Pressure ulcer of other site, stage 2: Secondary | ICD-10-CM | POA: Diagnosis not present

## 2014-01-18 DIAGNOSIS — S82891D Other fracture of right lower leg, subsequent encounter for closed fracture with routine healing: Secondary | ICD-10-CM | POA: Diagnosis not present

## 2014-01-18 DIAGNOSIS — T8131XD Disruption of external operation (surgical) wound, not elsewhere classified, subsequent encounter: Secondary | ICD-10-CM | POA: Diagnosis not present

## 2014-01-18 DIAGNOSIS — F329 Major depressive disorder, single episode, unspecified: Secondary | ICD-10-CM | POA: Diagnosis not present

## 2014-01-18 DIAGNOSIS — L89892 Pressure ulcer of other site, stage 2: Secondary | ICD-10-CM | POA: Diagnosis not present

## 2014-01-18 DIAGNOSIS — I10 Essential (primary) hypertension: Secondary | ICD-10-CM | POA: Diagnosis not present

## 2014-01-18 DIAGNOSIS — E119 Type 2 diabetes mellitus without complications: Secondary | ICD-10-CM | POA: Diagnosis not present

## 2014-01-18 DIAGNOSIS — S8261XD Displaced fracture of lateral malleolus of right fibula, subsequent encounter for closed fracture with routine healing: Secondary | ICD-10-CM | POA: Diagnosis not present

## 2014-01-18 DIAGNOSIS — L89512 Pressure ulcer of right ankle, stage 2: Secondary | ICD-10-CM | POA: Diagnosis not present

## 2014-01-18 DIAGNOSIS — S82871D Displaced pilon fracture of right tibia, subsequent encounter for closed fracture with routine healing: Secondary | ICD-10-CM | POA: Diagnosis not present

## 2014-01-20 DIAGNOSIS — L89512 Pressure ulcer of right ankle, stage 2: Secondary | ICD-10-CM | POA: Diagnosis not present

## 2014-01-20 DIAGNOSIS — L89892 Pressure ulcer of other site, stage 2: Secondary | ICD-10-CM | POA: Diagnosis not present

## 2014-01-20 DIAGNOSIS — I10 Essential (primary) hypertension: Secondary | ICD-10-CM | POA: Diagnosis not present

## 2014-01-20 DIAGNOSIS — S82891D Other fracture of right lower leg, subsequent encounter for closed fracture with routine healing: Secondary | ICD-10-CM | POA: Diagnosis not present

## 2014-01-20 DIAGNOSIS — E119 Type 2 diabetes mellitus without complications: Secondary | ICD-10-CM | POA: Diagnosis not present

## 2014-01-20 DIAGNOSIS — F329 Major depressive disorder, single episode, unspecified: Secondary | ICD-10-CM | POA: Diagnosis not present

## 2014-01-24 DIAGNOSIS — L89512 Pressure ulcer of right ankle, stage 2: Secondary | ICD-10-CM | POA: Diagnosis not present

## 2014-01-24 DIAGNOSIS — I10 Essential (primary) hypertension: Secondary | ICD-10-CM | POA: Diagnosis not present

## 2014-01-24 DIAGNOSIS — S82891D Other fracture of right lower leg, subsequent encounter for closed fracture with routine healing: Secondary | ICD-10-CM | POA: Diagnosis not present

## 2014-01-24 DIAGNOSIS — L89892 Pressure ulcer of other site, stage 2: Secondary | ICD-10-CM | POA: Diagnosis not present

## 2014-01-24 DIAGNOSIS — F329 Major depressive disorder, single episode, unspecified: Secondary | ICD-10-CM | POA: Diagnosis not present

## 2014-01-24 DIAGNOSIS — E119 Type 2 diabetes mellitus without complications: Secondary | ICD-10-CM | POA: Diagnosis not present

## 2014-01-25 DIAGNOSIS — L89512 Pressure ulcer of right ankle, stage 2: Secondary | ICD-10-CM | POA: Diagnosis not present

## 2014-01-25 DIAGNOSIS — I10 Essential (primary) hypertension: Secondary | ICD-10-CM | POA: Diagnosis not present

## 2014-01-25 DIAGNOSIS — L89892 Pressure ulcer of other site, stage 2: Secondary | ICD-10-CM | POA: Diagnosis not present

## 2014-01-25 DIAGNOSIS — S82891D Other fracture of right lower leg, subsequent encounter for closed fracture with routine healing: Secondary | ICD-10-CM | POA: Diagnosis not present

## 2014-01-25 DIAGNOSIS — E119 Type 2 diabetes mellitus without complications: Secondary | ICD-10-CM | POA: Diagnosis not present

## 2014-01-25 DIAGNOSIS — F329 Major depressive disorder, single episode, unspecified: Secondary | ICD-10-CM | POA: Diagnosis not present

## 2014-01-26 DIAGNOSIS — I10 Essential (primary) hypertension: Secondary | ICD-10-CM | POA: Diagnosis not present

## 2014-01-26 DIAGNOSIS — F329 Major depressive disorder, single episode, unspecified: Secondary | ICD-10-CM | POA: Diagnosis not present

## 2014-01-26 DIAGNOSIS — E119 Type 2 diabetes mellitus without complications: Secondary | ICD-10-CM | POA: Diagnosis not present

## 2014-01-26 DIAGNOSIS — L89892 Pressure ulcer of other site, stage 2: Secondary | ICD-10-CM | POA: Diagnosis not present

## 2014-01-26 DIAGNOSIS — S82891D Other fracture of right lower leg, subsequent encounter for closed fracture with routine healing: Secondary | ICD-10-CM | POA: Diagnosis not present

## 2014-01-26 DIAGNOSIS — L89512 Pressure ulcer of right ankle, stage 2: Secondary | ICD-10-CM | POA: Diagnosis not present

## 2014-01-27 DIAGNOSIS — L89892 Pressure ulcer of other site, stage 2: Secondary | ICD-10-CM | POA: Diagnosis not present

## 2014-01-27 DIAGNOSIS — I10 Essential (primary) hypertension: Secondary | ICD-10-CM | POA: Diagnosis not present

## 2014-01-27 DIAGNOSIS — L89512 Pressure ulcer of right ankle, stage 2: Secondary | ICD-10-CM | POA: Diagnosis not present

## 2014-01-27 DIAGNOSIS — S82891D Other fracture of right lower leg, subsequent encounter for closed fracture with routine healing: Secondary | ICD-10-CM | POA: Diagnosis not present

## 2014-01-27 DIAGNOSIS — F329 Major depressive disorder, single episode, unspecified: Secondary | ICD-10-CM | POA: Diagnosis not present

## 2014-01-27 DIAGNOSIS — E119 Type 2 diabetes mellitus without complications: Secondary | ICD-10-CM | POA: Diagnosis not present

## 2014-01-27 DIAGNOSIS — Z9181 History of falling: Secondary | ICD-10-CM | POA: Diagnosis not present

## 2014-01-27 DIAGNOSIS — K219 Gastro-esophageal reflux disease without esophagitis: Secondary | ICD-10-CM | POA: Diagnosis not present

## 2014-01-30 DIAGNOSIS — E119 Type 2 diabetes mellitus without complications: Secondary | ICD-10-CM | POA: Diagnosis not present

## 2014-01-30 DIAGNOSIS — L89512 Pressure ulcer of right ankle, stage 2: Secondary | ICD-10-CM | POA: Diagnosis not present

## 2014-01-30 DIAGNOSIS — F329 Major depressive disorder, single episode, unspecified: Secondary | ICD-10-CM | POA: Diagnosis not present

## 2014-01-30 DIAGNOSIS — S82891D Other fracture of right lower leg, subsequent encounter for closed fracture with routine healing: Secondary | ICD-10-CM | POA: Diagnosis not present

## 2014-01-30 DIAGNOSIS — L89892 Pressure ulcer of other site, stage 2: Secondary | ICD-10-CM | POA: Diagnosis not present

## 2014-01-30 DIAGNOSIS — I10 Essential (primary) hypertension: Secondary | ICD-10-CM | POA: Diagnosis not present

## 2014-02-01 DIAGNOSIS — L89512 Pressure ulcer of right ankle, stage 2: Secondary | ICD-10-CM | POA: Diagnosis not present

## 2014-02-01 DIAGNOSIS — E119 Type 2 diabetes mellitus without complications: Secondary | ICD-10-CM | POA: Diagnosis not present

## 2014-02-01 DIAGNOSIS — L89892 Pressure ulcer of other site, stage 2: Secondary | ICD-10-CM | POA: Diagnosis not present

## 2014-02-01 DIAGNOSIS — F329 Major depressive disorder, single episode, unspecified: Secondary | ICD-10-CM | POA: Diagnosis not present

## 2014-02-01 DIAGNOSIS — I10 Essential (primary) hypertension: Secondary | ICD-10-CM | POA: Diagnosis not present

## 2014-02-01 DIAGNOSIS — S82891D Other fracture of right lower leg, subsequent encounter for closed fracture with routine healing: Secondary | ICD-10-CM | POA: Diagnosis not present

## 2014-02-06 DIAGNOSIS — I10 Essential (primary) hypertension: Secondary | ICD-10-CM | POA: Diagnosis not present

## 2014-02-06 DIAGNOSIS — E119 Type 2 diabetes mellitus without complications: Secondary | ICD-10-CM | POA: Diagnosis not present

## 2014-02-06 DIAGNOSIS — S82891D Other fracture of right lower leg, subsequent encounter for closed fracture with routine healing: Secondary | ICD-10-CM | POA: Diagnosis not present

## 2014-02-06 DIAGNOSIS — L89512 Pressure ulcer of right ankle, stage 2: Secondary | ICD-10-CM | POA: Diagnosis not present

## 2014-02-06 DIAGNOSIS — F329 Major depressive disorder, single episode, unspecified: Secondary | ICD-10-CM | POA: Diagnosis not present

## 2014-02-06 DIAGNOSIS — L89892 Pressure ulcer of other site, stage 2: Secondary | ICD-10-CM | POA: Diagnosis not present

## 2014-02-07 DIAGNOSIS — T8131XA Disruption of external operation (surgical) wound, not elsewhere classified, initial encounter: Secondary | ICD-10-CM | POA: Diagnosis not present

## 2014-02-07 DIAGNOSIS — S82891D Other fracture of right lower leg, subsequent encounter for closed fracture with routine healing: Secondary | ICD-10-CM | POA: Diagnosis not present

## 2014-02-07 DIAGNOSIS — S91001A Unspecified open wound, right ankle, initial encounter: Secondary | ICD-10-CM | POA: Diagnosis not present

## 2014-02-07 DIAGNOSIS — Z833 Family history of diabetes mellitus: Secondary | ICD-10-CM | POA: Diagnosis not present

## 2014-02-07 DIAGNOSIS — L89892 Pressure ulcer of other site, stage 2: Secondary | ICD-10-CM | POA: Diagnosis not present

## 2014-02-07 DIAGNOSIS — Z8249 Family history of ischemic heart disease and other diseases of the circulatory system: Secondary | ICD-10-CM | POA: Diagnosis not present

## 2014-02-07 DIAGNOSIS — Z809 Family history of malignant neoplasm, unspecified: Secondary | ICD-10-CM | POA: Diagnosis not present

## 2014-02-07 DIAGNOSIS — I10 Essential (primary) hypertension: Secondary | ICD-10-CM | POA: Diagnosis not present

## 2014-02-07 DIAGNOSIS — F329 Major depressive disorder, single episode, unspecified: Secondary | ICD-10-CM | POA: Diagnosis not present

## 2014-02-07 DIAGNOSIS — E119 Type 2 diabetes mellitus without complications: Secondary | ICD-10-CM | POA: Diagnosis not present

## 2014-02-07 DIAGNOSIS — E039 Hypothyroidism, unspecified: Secondary | ICD-10-CM | POA: Diagnosis not present

## 2014-02-07 DIAGNOSIS — L89512 Pressure ulcer of right ankle, stage 2: Secondary | ICD-10-CM | POA: Diagnosis not present

## 2014-02-09 DIAGNOSIS — L89892 Pressure ulcer of other site, stage 2: Secondary | ICD-10-CM | POA: Diagnosis not present

## 2014-02-09 DIAGNOSIS — I10 Essential (primary) hypertension: Secondary | ICD-10-CM | POA: Diagnosis not present

## 2014-02-09 DIAGNOSIS — S82891D Other fracture of right lower leg, subsequent encounter for closed fracture with routine healing: Secondary | ICD-10-CM | POA: Diagnosis not present

## 2014-02-09 DIAGNOSIS — F329 Major depressive disorder, single episode, unspecified: Secondary | ICD-10-CM | POA: Diagnosis not present

## 2014-02-09 DIAGNOSIS — E119 Type 2 diabetes mellitus without complications: Secondary | ICD-10-CM | POA: Diagnosis not present

## 2014-02-09 DIAGNOSIS — L89512 Pressure ulcer of right ankle, stage 2: Secondary | ICD-10-CM | POA: Diagnosis not present

## 2014-02-13 DIAGNOSIS — L89512 Pressure ulcer of right ankle, stage 2: Secondary | ICD-10-CM | POA: Diagnosis not present

## 2014-02-13 DIAGNOSIS — L89892 Pressure ulcer of other site, stage 2: Secondary | ICD-10-CM | POA: Diagnosis not present

## 2014-02-13 DIAGNOSIS — E119 Type 2 diabetes mellitus without complications: Secondary | ICD-10-CM | POA: Diagnosis not present

## 2014-02-13 DIAGNOSIS — S82891D Other fracture of right lower leg, subsequent encounter for closed fracture with routine healing: Secondary | ICD-10-CM | POA: Diagnosis not present

## 2014-02-13 DIAGNOSIS — I10 Essential (primary) hypertension: Secondary | ICD-10-CM | POA: Diagnosis not present

## 2014-02-13 DIAGNOSIS — F329 Major depressive disorder, single episode, unspecified: Secondary | ICD-10-CM | POA: Diagnosis not present

## 2014-02-14 DIAGNOSIS — T8131XA Disruption of external operation (surgical) wound, not elsewhere classified, initial encounter: Secondary | ICD-10-CM | POA: Diagnosis not present

## 2014-02-14 DIAGNOSIS — E119 Type 2 diabetes mellitus without complications: Secondary | ICD-10-CM | POA: Diagnosis not present

## 2014-02-14 DIAGNOSIS — T8131XD Disruption of external operation (surgical) wound, not elsewhere classified, subsequent encounter: Secondary | ICD-10-CM | POA: Diagnosis not present

## 2014-02-14 DIAGNOSIS — E039 Hypothyroidism, unspecified: Secondary | ICD-10-CM | POA: Diagnosis not present

## 2014-02-14 DIAGNOSIS — Z833 Family history of diabetes mellitus: Secondary | ICD-10-CM | POA: Diagnosis not present

## 2014-02-14 DIAGNOSIS — Z809 Family history of malignant neoplasm, unspecified: Secondary | ICD-10-CM | POA: Diagnosis not present

## 2014-02-14 DIAGNOSIS — I10 Essential (primary) hypertension: Secondary | ICD-10-CM | POA: Diagnosis not present

## 2014-02-15 DIAGNOSIS — I10 Essential (primary) hypertension: Secondary | ICD-10-CM | POA: Diagnosis not present

## 2014-02-15 DIAGNOSIS — F329 Major depressive disorder, single episode, unspecified: Secondary | ICD-10-CM | POA: Diagnosis not present

## 2014-02-15 DIAGNOSIS — S8261XD Displaced fracture of lateral malleolus of right fibula, subsequent encounter for closed fracture with routine healing: Secondary | ICD-10-CM | POA: Diagnosis not present

## 2014-02-15 DIAGNOSIS — S82871D Displaced pilon fracture of right tibia, subsequent encounter for closed fracture with routine healing: Secondary | ICD-10-CM | POA: Diagnosis not present

## 2014-02-15 DIAGNOSIS — L89512 Pressure ulcer of right ankle, stage 2: Secondary | ICD-10-CM | POA: Diagnosis not present

## 2014-02-15 DIAGNOSIS — L89892 Pressure ulcer of other site, stage 2: Secondary | ICD-10-CM | POA: Diagnosis not present

## 2014-02-15 DIAGNOSIS — E119 Type 2 diabetes mellitus without complications: Secondary | ICD-10-CM | POA: Diagnosis not present

## 2014-02-15 DIAGNOSIS — T8131XD Disruption of external operation (surgical) wound, not elsewhere classified, subsequent encounter: Secondary | ICD-10-CM | POA: Diagnosis not present

## 2014-02-15 DIAGNOSIS — S82891D Other fracture of right lower leg, subsequent encounter for closed fracture with routine healing: Secondary | ICD-10-CM | POA: Diagnosis not present

## 2014-02-20 DIAGNOSIS — S82891D Other fracture of right lower leg, subsequent encounter for closed fracture with routine healing: Secondary | ICD-10-CM | POA: Diagnosis not present

## 2014-02-20 DIAGNOSIS — E119 Type 2 diabetes mellitus without complications: Secondary | ICD-10-CM | POA: Diagnosis not present

## 2014-02-20 DIAGNOSIS — F329 Major depressive disorder, single episode, unspecified: Secondary | ICD-10-CM | POA: Diagnosis not present

## 2014-02-20 DIAGNOSIS — L89512 Pressure ulcer of right ankle, stage 2: Secondary | ICD-10-CM | POA: Diagnosis not present

## 2014-02-20 DIAGNOSIS — I10 Essential (primary) hypertension: Secondary | ICD-10-CM | POA: Diagnosis not present

## 2014-02-20 DIAGNOSIS — L89892 Pressure ulcer of other site, stage 2: Secondary | ICD-10-CM | POA: Diagnosis not present

## 2014-02-22 DIAGNOSIS — L89512 Pressure ulcer of right ankle, stage 2: Secondary | ICD-10-CM | POA: Diagnosis not present

## 2014-02-22 DIAGNOSIS — Z833 Family history of diabetes mellitus: Secondary | ICD-10-CM | POA: Diagnosis not present

## 2014-02-22 DIAGNOSIS — T8131XA Disruption of external operation (surgical) wound, not elsewhere classified, initial encounter: Secondary | ICD-10-CM | POA: Diagnosis not present

## 2014-02-22 DIAGNOSIS — L89892 Pressure ulcer of other site, stage 2: Secondary | ICD-10-CM | POA: Diagnosis not present

## 2014-02-22 DIAGNOSIS — T8131XD Disruption of external operation (surgical) wound, not elsewhere classified, subsequent encounter: Secondary | ICD-10-CM | POA: Diagnosis not present

## 2014-02-22 DIAGNOSIS — S82891D Other fracture of right lower leg, subsequent encounter for closed fracture with routine healing: Secondary | ICD-10-CM | POA: Diagnosis not present

## 2014-02-22 DIAGNOSIS — F329 Major depressive disorder, single episode, unspecified: Secondary | ICD-10-CM | POA: Diagnosis not present

## 2014-02-22 DIAGNOSIS — Z809 Family history of malignant neoplasm, unspecified: Secondary | ICD-10-CM | POA: Diagnosis not present

## 2014-02-22 DIAGNOSIS — E039 Hypothyroidism, unspecified: Secondary | ICD-10-CM | POA: Diagnosis not present

## 2014-02-22 DIAGNOSIS — I10 Essential (primary) hypertension: Secondary | ICD-10-CM | POA: Diagnosis not present

## 2014-02-22 DIAGNOSIS — E119 Type 2 diabetes mellitus without complications: Secondary | ICD-10-CM | POA: Diagnosis not present

## 2014-02-28 DIAGNOSIS — H6120 Impacted cerumen, unspecified ear: Secondary | ICD-10-CM | POA: Diagnosis not present

## 2014-03-01 DIAGNOSIS — E119 Type 2 diabetes mellitus without complications: Secondary | ICD-10-CM | POA: Diagnosis not present

## 2014-03-01 DIAGNOSIS — Z833 Family history of diabetes mellitus: Secondary | ICD-10-CM | POA: Diagnosis not present

## 2014-03-01 DIAGNOSIS — I10 Essential (primary) hypertension: Secondary | ICD-10-CM | POA: Diagnosis not present

## 2014-03-01 DIAGNOSIS — Z809 Family history of malignant neoplasm, unspecified: Secondary | ICD-10-CM | POA: Diagnosis not present

## 2014-03-01 DIAGNOSIS — T8131XA Disruption of external operation (surgical) wound, not elsewhere classified, initial encounter: Secondary | ICD-10-CM | POA: Diagnosis not present

## 2014-03-01 DIAGNOSIS — T8131XD Disruption of external operation (surgical) wound, not elsewhere classified, subsequent encounter: Secondary | ICD-10-CM | POA: Diagnosis not present

## 2014-03-01 DIAGNOSIS — E039 Hypothyroidism, unspecified: Secondary | ICD-10-CM | POA: Diagnosis not present

## 2014-03-08 DIAGNOSIS — I10 Essential (primary) hypertension: Secondary | ICD-10-CM | POA: Diagnosis not present

## 2014-03-08 DIAGNOSIS — E119 Type 2 diabetes mellitus without complications: Secondary | ICD-10-CM | POA: Diagnosis not present

## 2014-03-08 DIAGNOSIS — E039 Hypothyroidism, unspecified: Secondary | ICD-10-CM | POA: Diagnosis not present

## 2014-03-08 DIAGNOSIS — T8131XA Disruption of external operation (surgical) wound, not elsewhere classified, initial encounter: Secondary | ICD-10-CM | POA: Diagnosis not present

## 2014-03-08 DIAGNOSIS — Z833 Family history of diabetes mellitus: Secondary | ICD-10-CM | POA: Diagnosis not present

## 2014-03-08 DIAGNOSIS — Z809 Family history of malignant neoplasm, unspecified: Secondary | ICD-10-CM | POA: Diagnosis not present

## 2014-03-08 DIAGNOSIS — T8131XD Disruption of external operation (surgical) wound, not elsewhere classified, subsequent encounter: Secondary | ICD-10-CM | POA: Diagnosis not present

## 2014-03-15 DIAGNOSIS — T8131XD Disruption of external operation (surgical) wound, not elsewhere classified, subsequent encounter: Secondary | ICD-10-CM | POA: Diagnosis not present

## 2014-03-27 DIAGNOSIS — N183 Chronic kidney disease, stage 3 (moderate): Secondary | ICD-10-CM | POA: Diagnosis not present

## 2014-03-27 DIAGNOSIS — E039 Hypothyroidism, unspecified: Secondary | ICD-10-CM | POA: Diagnosis not present

## 2014-03-27 DIAGNOSIS — E119 Type 2 diabetes mellitus without complications: Secondary | ICD-10-CM | POA: Diagnosis not present

## 2014-03-27 DIAGNOSIS — I10 Essential (primary) hypertension: Secondary | ICD-10-CM | POA: Diagnosis not present

## 2014-03-27 DIAGNOSIS — E782 Mixed hyperlipidemia: Secondary | ICD-10-CM | POA: Diagnosis not present

## 2014-03-27 DIAGNOSIS — Z720 Tobacco use: Secondary | ICD-10-CM | POA: Diagnosis not present

## 2014-04-11 DIAGNOSIS — N183 Chronic kidney disease, stage 3 (moderate): Secondary | ICD-10-CM | POA: Diagnosis not present

## 2014-04-11 DIAGNOSIS — G4733 Obstructive sleep apnea (adult) (pediatric): Secondary | ICD-10-CM | POA: Diagnosis not present

## 2014-04-11 DIAGNOSIS — Z720 Tobacco use: Secondary | ICD-10-CM | POA: Diagnosis not present

## 2014-04-11 DIAGNOSIS — I10 Essential (primary) hypertension: Secondary | ICD-10-CM | POA: Diagnosis not present

## 2014-04-11 DIAGNOSIS — J301 Allergic rhinitis due to pollen: Secondary | ICD-10-CM | POA: Diagnosis not present

## 2014-04-11 DIAGNOSIS — E039 Hypothyroidism, unspecified: Secondary | ICD-10-CM | POA: Diagnosis not present

## 2014-04-11 DIAGNOSIS — Z1389 Encounter for screening for other disorder: Secondary | ICD-10-CM | POA: Diagnosis not present

## 2014-04-11 DIAGNOSIS — E1122 Type 2 diabetes mellitus with diabetic chronic kidney disease: Secondary | ICD-10-CM | POA: Diagnosis not present

## 2014-04-11 DIAGNOSIS — J449 Chronic obstructive pulmonary disease, unspecified: Secondary | ICD-10-CM | POA: Diagnosis not present

## 2014-04-11 DIAGNOSIS — F331 Major depressive disorder, recurrent, moderate: Secondary | ICD-10-CM | POA: Diagnosis not present

## 2014-04-11 DIAGNOSIS — E782 Mixed hyperlipidemia: Secondary | ICD-10-CM | POA: Diagnosis not present

## 2014-04-11 DIAGNOSIS — Z9189 Other specified personal risk factors, not elsewhere classified: Secondary | ICD-10-CM | POA: Diagnosis not present

## 2014-05-09 DIAGNOSIS — Z1231 Encounter for screening mammogram for malignant neoplasm of breast: Secondary | ICD-10-CM | POA: Diagnosis not present

## 2014-08-14 DIAGNOSIS — E782 Mixed hyperlipidemia: Secondary | ICD-10-CM | POA: Diagnosis not present

## 2014-08-14 DIAGNOSIS — I10 Essential (primary) hypertension: Secondary | ICD-10-CM | POA: Diagnosis not present

## 2014-08-14 DIAGNOSIS — E119 Type 2 diabetes mellitus without complications: Secondary | ICD-10-CM | POA: Diagnosis not present

## 2014-08-15 DIAGNOSIS — E782 Mixed hyperlipidemia: Secondary | ICD-10-CM | POA: Diagnosis not present

## 2014-08-22 DIAGNOSIS — E782 Mixed hyperlipidemia: Secondary | ICD-10-CM | POA: Diagnosis not present

## 2014-08-22 DIAGNOSIS — F331 Major depressive disorder, recurrent, moderate: Secondary | ICD-10-CM | POA: Diagnosis not present

## 2014-08-22 DIAGNOSIS — L400 Psoriasis vulgaris: Secondary | ICD-10-CM | POA: Diagnosis not present

## 2014-08-22 DIAGNOSIS — I10 Essential (primary) hypertension: Secondary | ICD-10-CM | POA: Diagnosis not present

## 2014-08-22 DIAGNOSIS — K7581 Nonalcoholic steatohepatitis (NASH): Secondary | ICD-10-CM | POA: Diagnosis not present

## 2014-08-22 DIAGNOSIS — E1122 Type 2 diabetes mellitus with diabetic chronic kidney disease: Secondary | ICD-10-CM | POA: Diagnosis not present

## 2014-08-22 DIAGNOSIS — J449 Chronic obstructive pulmonary disease, unspecified: Secondary | ICD-10-CM | POA: Diagnosis not present

## 2014-08-22 DIAGNOSIS — G4733 Obstructive sleep apnea (adult) (pediatric): Secondary | ICD-10-CM | POA: Diagnosis not present

## 2014-08-22 DIAGNOSIS — E039 Hypothyroidism, unspecified: Secondary | ICD-10-CM | POA: Diagnosis not present

## 2014-08-22 DIAGNOSIS — N183 Chronic kidney disease, stage 3 (moderate): Secondary | ICD-10-CM | POA: Diagnosis not present

## 2014-08-22 DIAGNOSIS — J301 Allergic rhinitis due to pollen: Secondary | ICD-10-CM | POA: Diagnosis not present

## 2014-08-22 DIAGNOSIS — R300 Dysuria: Secondary | ICD-10-CM | POA: Diagnosis not present

## 2014-09-13 DIAGNOSIS — I10 Essential (primary) hypertension: Secondary | ICD-10-CM | POA: Diagnosis not present

## 2014-09-19 DIAGNOSIS — I10 Essential (primary) hypertension: Secondary | ICD-10-CM | POA: Diagnosis not present

## 2014-09-29 NOTE — Patient Outreach (Signed)
[facility]) Care Management  09/29/2014  ELLENI MOZINGO 1936-01-26 [id number]   Referral from [place], Sherrin Daisy, RN assigned to outreach.  Ronnell Freshwater. [place], [place] Management [place] Assistant Phone: [phone] Fax: [phone]

## 2014-11-02 ENCOUNTER — Other Ambulatory Visit: Payer: Self-pay | Admitting: *Deleted

## 2014-11-02 NOTE — Patient Outreach (Signed)
[facility]) Care Management  11/02/2014  Miranda Wilkinson 1936/03/07 [id number]   High Risk referral: Telephone call to patient, left message on voice mail requesting return call.  Plan: will follow up. Sherrin Daisy, RN BSN [place] Management Coordinator [facility] Care Management  [phone]

## 2014-11-08 ENCOUNTER — Other Ambulatory Visit: Payer: Medicare Other | Admitting: *Deleted

## 2014-11-08 NOTE — Patient Outreach (Signed)
[facility]) Care Management  11/08/2014  Miranda Wilkinson 05-Feb-1936 [id number]  Telephone call attempt x 2; left message on voice mail requesting return call. Plan: will follow up.  Sherrin Daisy, RN BSN [place] Management Coordinator [facility] Care Management  [phone]

## 2014-11-09 ENCOUNTER — Encounter: Payer: Self-pay | Admitting: *Deleted

## 2014-11-09 ENCOUNTER — Other Ambulatory Visit: Payer: Self-pay | Admitting: *Deleted

## 2014-11-09 NOTE — Patient Outreach (Signed)
[facility]) Care Management  11/09/2014  Miranda Wilkinson May 16, 1935 [id number]  Telephone call x 3 to patient; left message on voice mail requesting return call. Plan: will send outreach letter and follow up in 10 business days.  Sherrin Daisy, RN BSN [place] Management Coordinator [facility] Care Management  [phone]

## 2014-11-15 DIAGNOSIS — J449 Chronic obstructive pulmonary disease, unspecified: Secondary | ICD-10-CM | POA: Diagnosis not present

## 2014-11-15 DIAGNOSIS — Z23 Encounter for immunization: Secondary | ICD-10-CM | POA: Diagnosis not present

## 2014-11-15 DIAGNOSIS — F331 Major depressive disorder, recurrent, moderate: Secondary | ICD-10-CM | POA: Diagnosis not present

## 2014-11-15 DIAGNOSIS — L400 Psoriasis vulgaris: Secondary | ICD-10-CM | POA: Diagnosis not present

## 2014-11-15 DIAGNOSIS — G4733 Obstructive sleep apnea (adult) (pediatric): Secondary | ICD-10-CM | POA: Diagnosis not present

## 2014-11-15 DIAGNOSIS — K7581 Nonalcoholic steatohepatitis (NASH): Secondary | ICD-10-CM | POA: Diagnosis not present

## 2014-11-15 DIAGNOSIS — J301 Allergic rhinitis due to pollen: Secondary | ICD-10-CM | POA: Diagnosis not present

## 2014-11-15 DIAGNOSIS — E039 Hypothyroidism, unspecified: Secondary | ICD-10-CM | POA: Diagnosis not present

## 2014-11-15 DIAGNOSIS — E782 Mixed hyperlipidemia: Secondary | ICD-10-CM | POA: Diagnosis not present

## 2014-11-15 DIAGNOSIS — I10 Essential (primary) hypertension: Secondary | ICD-10-CM | POA: Diagnosis not present

## 2014-11-15 DIAGNOSIS — E1122 Type 2 diabetes mellitus with diabetic chronic kidney disease: Secondary | ICD-10-CM | POA: Diagnosis not present

## 2014-11-15 DIAGNOSIS — N183 Chronic kidney disease, stage 3 (moderate): Secondary | ICD-10-CM | POA: Diagnosis not present

## 2014-11-27 ENCOUNTER — Encounter: Payer: Self-pay | Admitting: *Deleted

## 2014-11-27 NOTE — Patient Outreach (Signed)
[facility]) Care Management  11/27/2014  MARGIA WIESEN 1935-05-29 [id number]  Attempts to contact patient have been unsuccessful.  Plan: will close case and send MD closure letter.  Sherrin Daisy, RN BSN [place] Management Coordinator [facility] Care Management  [phone]

## 2014-12-05 NOTE — Patient Outreach (Signed)
[facility] [facility]) Care Management  12/05/2014  Miranda Wilkinson Jul 11, 1935 [id number]   Notification from Sherrin Daisy, RN to close case due to unable to contact patient for [place] Management services.  Thanks, Ronnell Freshwater. [place], [place] Assistant Phone: [phone] Fax: [phone]

## 2015-03-21 DIAGNOSIS — I10 Essential (primary) hypertension: Secondary | ICD-10-CM | POA: Diagnosis not present

## 2015-03-21 DIAGNOSIS — N183 Chronic kidney disease, stage 3 (moderate): Secondary | ICD-10-CM | POA: Diagnosis not present

## 2015-03-21 DIAGNOSIS — E782 Mixed hyperlipidemia: Secondary | ICD-10-CM | POA: Diagnosis not present

## 2015-03-21 DIAGNOSIS — E119 Type 2 diabetes mellitus without complications: Secondary | ICD-10-CM | POA: Diagnosis not present

## 2015-03-21 DIAGNOSIS — E039 Hypothyroidism, unspecified: Secondary | ICD-10-CM | POA: Diagnosis not present

## 2015-03-26 DIAGNOSIS — G4733 Obstructive sleep apnea (adult) (pediatric): Secondary | ICD-10-CM | POA: Diagnosis not present

## 2015-03-26 DIAGNOSIS — J449 Chronic obstructive pulmonary disease, unspecified: Secondary | ICD-10-CM | POA: Diagnosis not present

## 2015-03-26 DIAGNOSIS — E1122 Type 2 diabetes mellitus with diabetic chronic kidney disease: Secondary | ICD-10-CM | POA: Diagnosis not present

## 2015-03-26 DIAGNOSIS — K7581 Nonalcoholic steatohepatitis (NASH): Secondary | ICD-10-CM | POA: Diagnosis not present

## 2015-03-26 DIAGNOSIS — E782 Mixed hyperlipidemia: Secondary | ICD-10-CM | POA: Diagnosis not present

## 2015-03-26 DIAGNOSIS — L03116 Cellulitis of left lower limb: Secondary | ICD-10-CM | POA: Diagnosis not present

## 2015-03-26 DIAGNOSIS — J301 Allergic rhinitis due to pollen: Secondary | ICD-10-CM | POA: Diagnosis not present

## 2015-03-26 DIAGNOSIS — F331 Major depressive disorder, recurrent, moderate: Secondary | ICD-10-CM | POA: Diagnosis not present

## 2015-03-26 DIAGNOSIS — N183 Chronic kidney disease, stage 3 (moderate): Secondary | ICD-10-CM | POA: Diagnosis not present

## 2015-03-26 DIAGNOSIS — L400 Psoriasis vulgaris: Secondary | ICD-10-CM | POA: Diagnosis not present

## 2015-03-26 DIAGNOSIS — E870 Hyperosmolality and hypernatremia: Secondary | ICD-10-CM | POA: Diagnosis not present

## 2015-03-26 DIAGNOSIS — I10 Essential (primary) hypertension: Secondary | ICD-10-CM | POA: Diagnosis not present

## 2015-03-26 DIAGNOSIS — E039 Hypothyroidism, unspecified: Secondary | ICD-10-CM | POA: Diagnosis not present

## 2015-04-10 DIAGNOSIS — Z7982 Long term (current) use of aspirin: Secondary | ICD-10-CM | POA: Diagnosis not present

## 2015-04-10 DIAGNOSIS — I83028 Varicose veins of left lower extremity with ulcer other part of lower leg: Secondary | ICD-10-CM | POA: Diagnosis not present

## 2015-04-10 DIAGNOSIS — E119 Type 2 diabetes mellitus without complications: Secondary | ICD-10-CM | POA: Diagnosis not present

## 2015-04-10 DIAGNOSIS — L97829 Non-pressure chronic ulcer of other part of left lower leg with unspecified severity: Secondary | ICD-10-CM | POA: Diagnosis not present

## 2015-04-10 DIAGNOSIS — L97822 Non-pressure chronic ulcer of other part of left lower leg with fat layer exposed: Secondary | ICD-10-CM | POA: Diagnosis not present

## 2015-04-10 DIAGNOSIS — Z79899 Other long term (current) drug therapy: Secondary | ICD-10-CM | POA: Diagnosis not present

## 2015-04-11 DIAGNOSIS — I83028 Varicose veins of left lower extremity with ulcer other part of lower leg: Secondary | ICD-10-CM | POA: Diagnosis not present

## 2015-04-11 DIAGNOSIS — Z7982 Long term (current) use of aspirin: Secondary | ICD-10-CM | POA: Diagnosis not present

## 2015-04-11 DIAGNOSIS — L97829 Non-pressure chronic ulcer of other part of left lower leg with unspecified severity: Secondary | ICD-10-CM | POA: Diagnosis not present

## 2015-04-11 DIAGNOSIS — Z79899 Other long term (current) drug therapy: Secondary | ICD-10-CM | POA: Diagnosis not present

## 2015-04-12 DIAGNOSIS — I10 Essential (primary) hypertension: Secondary | ICD-10-CM | POA: Diagnosis not present

## 2015-04-12 DIAGNOSIS — E119 Type 2 diabetes mellitus without complications: Secondary | ICD-10-CM | POA: Diagnosis not present

## 2015-04-17 DIAGNOSIS — L97829 Non-pressure chronic ulcer of other part of left lower leg with unspecified severity: Secondary | ICD-10-CM | POA: Diagnosis not present

## 2015-04-17 DIAGNOSIS — I872 Venous insufficiency (chronic) (peripheral): Secondary | ICD-10-CM | POA: Diagnosis not present

## 2015-04-17 DIAGNOSIS — L97822 Non-pressure chronic ulcer of other part of left lower leg with fat layer exposed: Secondary | ICD-10-CM | POA: Diagnosis not present

## 2015-04-18 DIAGNOSIS — I872 Venous insufficiency (chronic) (peripheral): Secondary | ICD-10-CM | POA: Diagnosis not present

## 2015-04-18 DIAGNOSIS — L97829 Non-pressure chronic ulcer of other part of left lower leg with unspecified severity: Secondary | ICD-10-CM | POA: Diagnosis not present

## 2015-04-24 DIAGNOSIS — I872 Venous insufficiency (chronic) (peripheral): Secondary | ICD-10-CM | POA: Diagnosis not present

## 2015-04-24 DIAGNOSIS — I83028 Varicose veins of left lower extremity with ulcer other part of lower leg: Secondary | ICD-10-CM | POA: Diagnosis not present

## 2015-04-24 DIAGNOSIS — L97829 Non-pressure chronic ulcer of other part of left lower leg with unspecified severity: Secondary | ICD-10-CM | POA: Diagnosis not present

## 2015-04-24 DIAGNOSIS — L97822 Non-pressure chronic ulcer of other part of left lower leg with fat layer exposed: Secondary | ICD-10-CM | POA: Diagnosis not present

## 2015-05-01 DIAGNOSIS — I83028 Varicose veins of left lower extremity with ulcer other part of lower leg: Secondary | ICD-10-CM | POA: Diagnosis not present

## 2015-05-01 DIAGNOSIS — L97822 Non-pressure chronic ulcer of other part of left lower leg with fat layer exposed: Secondary | ICD-10-CM | POA: Diagnosis not present

## 2015-05-01 DIAGNOSIS — L97829 Non-pressure chronic ulcer of other part of left lower leg with unspecified severity: Secondary | ICD-10-CM | POA: Diagnosis not present

## 2015-05-01 DIAGNOSIS — I872 Venous insufficiency (chronic) (peripheral): Secondary | ICD-10-CM | POA: Diagnosis not present

## 2015-05-09 DIAGNOSIS — I872 Venous insufficiency (chronic) (peripheral): Secondary | ICD-10-CM | POA: Diagnosis not present

## 2015-05-09 DIAGNOSIS — I83028 Varicose veins of left lower extremity with ulcer other part of lower leg: Secondary | ICD-10-CM | POA: Diagnosis not present

## 2015-05-09 DIAGNOSIS — L97829 Non-pressure chronic ulcer of other part of left lower leg with unspecified severity: Secondary | ICD-10-CM | POA: Diagnosis not present

## 2015-05-09 DIAGNOSIS — L97822 Non-pressure chronic ulcer of other part of left lower leg with fat layer exposed: Secondary | ICD-10-CM | POA: Diagnosis not present

## 2015-05-16 DIAGNOSIS — I83028 Varicose veins of left lower extremity with ulcer other part of lower leg: Secondary | ICD-10-CM | POA: Diagnosis not present

## 2015-05-16 DIAGNOSIS — I872 Venous insufficiency (chronic) (peripheral): Secondary | ICD-10-CM | POA: Diagnosis not present

## 2015-05-16 DIAGNOSIS — L97829 Non-pressure chronic ulcer of other part of left lower leg with unspecified severity: Secondary | ICD-10-CM | POA: Diagnosis not present

## 2015-05-16 DIAGNOSIS — L97822 Non-pressure chronic ulcer of other part of left lower leg with fat layer exposed: Secondary | ICD-10-CM | POA: Diagnosis not present

## 2015-06-20 IMAGING — CR DG PELVIS 1-2V
1 series · 1 of 1 positions shown · non-contrast
Comparison: None.

CLINICAL DATA: Fall with pelvic pain.

EXAM:
PELVIS - 1-2 VIEW

[t pelvis ap]
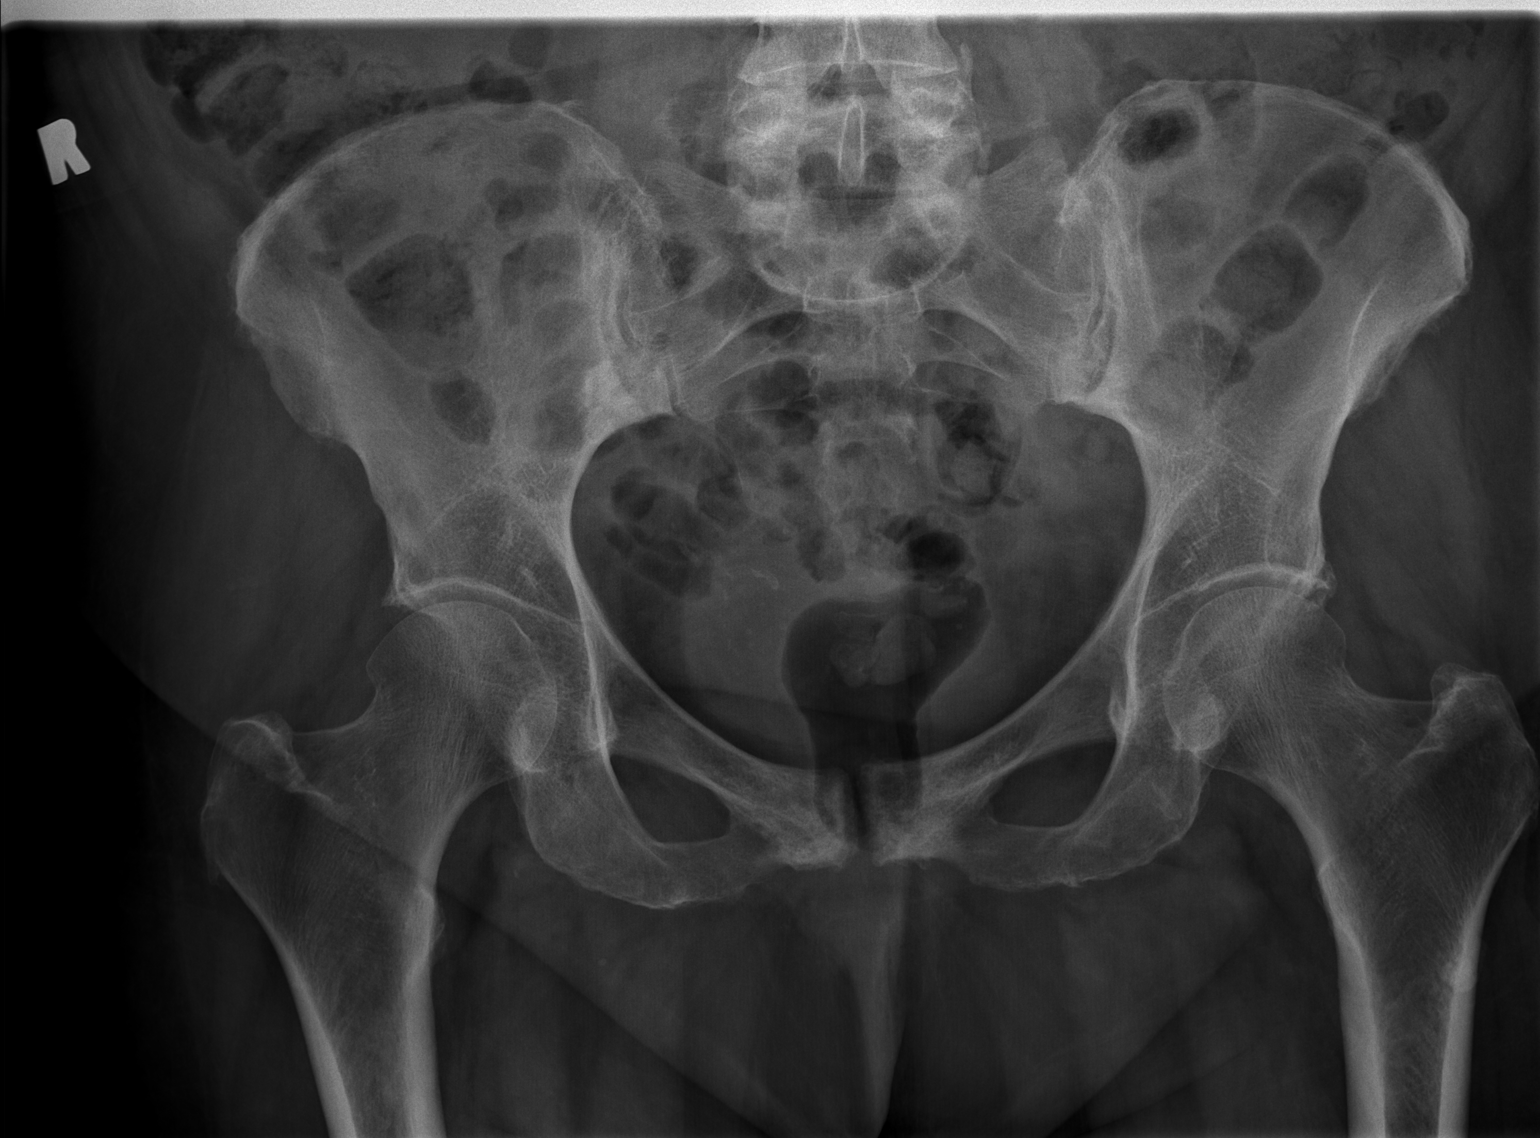

[1 of 1 positions shown; findings below may reference images not displayed]

FINDINGS: There is no evidence of pelvic fracture or diastasis. No other
pelvic bone lesions are seen.

Mild degenerative changes in the hips and lower lumbar spine
identified.
IMPRESSION: No evidence of acute bony abnormality.

Mild degenerative changes in both hips and lower lumbar spine.

## 2015-07-27 DIAGNOSIS — E039 Hypothyroidism, unspecified: Secondary | ICD-10-CM | POA: Diagnosis not present

## 2015-07-27 DIAGNOSIS — E782 Mixed hyperlipidemia: Secondary | ICD-10-CM | POA: Diagnosis not present

## 2015-07-27 DIAGNOSIS — N183 Chronic kidney disease, stage 3 (moderate): Secondary | ICD-10-CM | POA: Diagnosis not present

## 2015-07-27 DIAGNOSIS — I10 Essential (primary) hypertension: Secondary | ICD-10-CM | POA: Diagnosis not present

## 2015-07-27 DIAGNOSIS — E119 Type 2 diabetes mellitus without complications: Secondary | ICD-10-CM | POA: Diagnosis not present

## 2015-07-31 DIAGNOSIS — G4733 Obstructive sleep apnea (adult) (pediatric): Secondary | ICD-10-CM | POA: Diagnosis not present

## 2015-07-31 DIAGNOSIS — E039 Hypothyroidism, unspecified: Secondary | ICD-10-CM | POA: Diagnosis not present

## 2015-07-31 DIAGNOSIS — F331 Major depressive disorder, recurrent, moderate: Secondary | ICD-10-CM | POA: Diagnosis not present

## 2015-07-31 DIAGNOSIS — I10 Essential (primary) hypertension: Secondary | ICD-10-CM | POA: Diagnosis not present

## 2015-07-31 DIAGNOSIS — Z1212 Encounter for screening for malignant neoplasm of rectum: Secondary | ICD-10-CM | POA: Diagnosis not present

## 2015-07-31 DIAGNOSIS — N183 Chronic kidney disease, stage 3 (moderate): Secondary | ICD-10-CM | POA: Diagnosis not present

## 2015-07-31 DIAGNOSIS — E782 Mixed hyperlipidemia: Secondary | ICD-10-CM | POA: Diagnosis not present

## 2015-07-31 DIAGNOSIS — E1122 Type 2 diabetes mellitus with diabetic chronic kidney disease: Secondary | ICD-10-CM | POA: Diagnosis not present

## 2015-08-09 DIAGNOSIS — Z1231 Encounter for screening mammogram for malignant neoplasm of breast: Secondary | ICD-10-CM | POA: Diagnosis not present

## 2015-11-27 DIAGNOSIS — G4733 Obstructive sleep apnea (adult) (pediatric): Secondary | ICD-10-CM | POA: Diagnosis not present

## 2015-11-27 DIAGNOSIS — E782 Mixed hyperlipidemia: Secondary | ICD-10-CM | POA: Diagnosis not present

## 2015-11-27 DIAGNOSIS — E1122 Type 2 diabetes mellitus with diabetic chronic kidney disease: Secondary | ICD-10-CM | POA: Diagnosis not present

## 2015-11-27 DIAGNOSIS — K7581 Nonalcoholic steatohepatitis (NASH): Secondary | ICD-10-CM | POA: Diagnosis not present

## 2015-11-27 DIAGNOSIS — I10 Essential (primary) hypertension: Secondary | ICD-10-CM | POA: Diagnosis not present

## 2015-11-27 DIAGNOSIS — F331 Major depressive disorder, recurrent, moderate: Secondary | ICD-10-CM | POA: Diagnosis not present

## 2015-11-27 DIAGNOSIS — E039 Hypothyroidism, unspecified: Secondary | ICD-10-CM | POA: Diagnosis not present

## 2015-11-30 DIAGNOSIS — Z23 Encounter for immunization: Secondary | ICD-10-CM | POA: Diagnosis not present

## 2015-11-30 DIAGNOSIS — G4733 Obstructive sleep apnea (adult) (pediatric): Secondary | ICD-10-CM | POA: Diagnosis not present

## 2015-11-30 DIAGNOSIS — Z6837 Body mass index (BMI) 37.0-37.9, adult: Secondary | ICD-10-CM | POA: Diagnosis not present

## 2015-11-30 DIAGNOSIS — E782 Mixed hyperlipidemia: Secondary | ICD-10-CM | POA: Diagnosis not present

## 2015-11-30 DIAGNOSIS — E1122 Type 2 diabetes mellitus with diabetic chronic kidney disease: Secondary | ICD-10-CM | POA: Diagnosis not present

## 2015-11-30 DIAGNOSIS — E039 Hypothyroidism, unspecified: Secondary | ICD-10-CM | POA: Diagnosis not present

## 2015-11-30 DIAGNOSIS — F331 Major depressive disorder, recurrent, moderate: Secondary | ICD-10-CM | POA: Diagnosis not present

## 2015-11-30 DIAGNOSIS — I10 Essential (primary) hypertension: Secondary | ICD-10-CM | POA: Diagnosis not present

## 2016-01-17 DIAGNOSIS — Z87891 Personal history of nicotine dependence: Secondary | ICD-10-CM | POA: Diagnosis not present

## 2016-01-17 DIAGNOSIS — I10 Essential (primary) hypertension: Secondary | ICD-10-CM | POA: Diagnosis not present

## 2016-01-17 DIAGNOSIS — Z8262 Family history of osteoporosis: Secondary | ICD-10-CM | POA: Diagnosis not present

## 2016-01-17 DIAGNOSIS — M810 Age-related osteoporosis without current pathological fracture: Secondary | ICD-10-CM | POA: Diagnosis not present

## 2016-01-17 DIAGNOSIS — E7800 Pure hypercholesterolemia, unspecified: Secondary | ICD-10-CM | POA: Diagnosis not present

## 2016-01-17 DIAGNOSIS — M85852 Other specified disorders of bone density and structure, left thigh: Secondary | ICD-10-CM | POA: Diagnosis not present

## 2016-01-17 DIAGNOSIS — E039 Hypothyroidism, unspecified: Secondary | ICD-10-CM | POA: Diagnosis not present

## 2016-01-17 DIAGNOSIS — M85851 Other specified disorders of bone density and structure, right thigh: Secondary | ICD-10-CM | POA: Diagnosis not present

## 2016-01-17 DIAGNOSIS — Z85850 Personal history of malignant neoplasm of thyroid: Secondary | ICD-10-CM | POA: Diagnosis not present

## 2016-01-17 DIAGNOSIS — E119 Type 2 diabetes mellitus without complications: Secondary | ICD-10-CM | POA: Diagnosis not present

## 2016-01-17 DIAGNOSIS — Z9889 Other specified postprocedural states: Secondary | ICD-10-CM | POA: Diagnosis not present

## 2016-01-17 DIAGNOSIS — Z780 Asymptomatic menopausal state: Secondary | ICD-10-CM | POA: Diagnosis not present

## 2016-01-17 DIAGNOSIS — Z7982 Long term (current) use of aspirin: Secondary | ICD-10-CM | POA: Diagnosis not present

## 2016-01-17 DIAGNOSIS — Z7984 Long term (current) use of oral hypoglycemic drugs: Secondary | ICD-10-CM | POA: Diagnosis not present

## 2016-01-17 DIAGNOSIS — Z79899 Other long term (current) drug therapy: Secondary | ICD-10-CM | POA: Diagnosis not present

## 2016-03-31 DIAGNOSIS — J449 Chronic obstructive pulmonary disease, unspecified: Secondary | ICD-10-CM | POA: Diagnosis not present

## 2016-03-31 DIAGNOSIS — E1122 Type 2 diabetes mellitus with diabetic chronic kidney disease: Secondary | ICD-10-CM | POA: Diagnosis not present

## 2016-03-31 DIAGNOSIS — F331 Major depressive disorder, recurrent, moderate: Secondary | ICD-10-CM | POA: Diagnosis not present

## 2016-03-31 DIAGNOSIS — E039 Hypothyroidism, unspecified: Secondary | ICD-10-CM | POA: Diagnosis not present

## 2016-03-31 DIAGNOSIS — E782 Mixed hyperlipidemia: Secondary | ICD-10-CM | POA: Diagnosis not present

## 2016-03-31 DIAGNOSIS — I10 Essential (primary) hypertension: Secondary | ICD-10-CM | POA: Diagnosis not present

## 2016-04-02 DIAGNOSIS — G4733 Obstructive sleep apnea (adult) (pediatric): Secondary | ICD-10-CM | POA: Diagnosis not present

## 2016-04-02 DIAGNOSIS — E039 Hypothyroidism, unspecified: Secondary | ICD-10-CM | POA: Diagnosis not present

## 2016-04-02 DIAGNOSIS — E1122 Type 2 diabetes mellitus with diabetic chronic kidney disease: Secondary | ICD-10-CM | POA: Diagnosis not present

## 2016-04-02 DIAGNOSIS — Z6835 Body mass index (BMI) 35.0-35.9, adult: Secondary | ICD-10-CM | POA: Diagnosis not present

## 2016-04-02 DIAGNOSIS — F331 Major depressive disorder, recurrent, moderate: Secondary | ICD-10-CM | POA: Diagnosis not present

## 2016-04-02 DIAGNOSIS — E782 Mixed hyperlipidemia: Secondary | ICD-10-CM | POA: Diagnosis not present

## 2016-04-02 DIAGNOSIS — I10 Essential (primary) hypertension: Secondary | ICD-10-CM | POA: Diagnosis not present

## 2016-04-02 DIAGNOSIS — J9611 Chronic respiratory failure with hypoxia: Secondary | ICD-10-CM | POA: Diagnosis not present

## 2016-08-05 DIAGNOSIS — Z9189 Other specified personal risk factors, not elsewhere classified: Secondary | ICD-10-CM | POA: Diagnosis not present

## 2016-08-05 DIAGNOSIS — I10 Essential (primary) hypertension: Secondary | ICD-10-CM | POA: Diagnosis not present

## 2016-08-05 DIAGNOSIS — E1122 Type 2 diabetes mellitus with diabetic chronic kidney disease: Secondary | ICD-10-CM | POA: Diagnosis not present

## 2016-08-05 DIAGNOSIS — N183 Chronic kidney disease, stage 3 (moderate): Secondary | ICD-10-CM | POA: Diagnosis not present

## 2016-08-05 DIAGNOSIS — E782 Mixed hyperlipidemia: Secondary | ICD-10-CM | POA: Diagnosis not present

## 2016-08-05 DIAGNOSIS — E039 Hypothyroidism, unspecified: Secondary | ICD-10-CM | POA: Diagnosis not present

## 2016-08-05 DIAGNOSIS — E119 Type 2 diabetes mellitus without complications: Secondary | ICD-10-CM | POA: Diagnosis not present

## 2016-08-07 DIAGNOSIS — E039 Hypothyroidism, unspecified: Secondary | ICD-10-CM | POA: Diagnosis not present

## 2016-08-07 DIAGNOSIS — Z6835 Body mass index (BMI) 35.0-35.9, adult: Secondary | ICD-10-CM | POA: Diagnosis not present

## 2016-08-07 DIAGNOSIS — J9611 Chronic respiratory failure with hypoxia: Secondary | ICD-10-CM | POA: Diagnosis not present

## 2016-08-07 DIAGNOSIS — E1122 Type 2 diabetes mellitus with diabetic chronic kidney disease: Secondary | ICD-10-CM | POA: Diagnosis not present

## 2016-08-07 DIAGNOSIS — I10 Essential (primary) hypertension: Secondary | ICD-10-CM | POA: Diagnosis not present

## 2016-08-07 DIAGNOSIS — F331 Major depressive disorder, recurrent, moderate: Secondary | ICD-10-CM | POA: Diagnosis not present

## 2016-08-07 DIAGNOSIS — G4733 Obstructive sleep apnea (adult) (pediatric): Secondary | ICD-10-CM | POA: Diagnosis not present

## 2016-08-07 DIAGNOSIS — E782 Mixed hyperlipidemia: Secondary | ICD-10-CM | POA: Diagnosis not present

## 2016-12-14 DIAGNOSIS — Z23 Encounter for immunization: Secondary | ICD-10-CM | POA: Diagnosis not present

## 2016-12-18 DIAGNOSIS — E782 Mixed hyperlipidemia: Secondary | ICD-10-CM | POA: Diagnosis not present

## 2016-12-18 DIAGNOSIS — K7581 Nonalcoholic steatohepatitis (NASH): Secondary | ICD-10-CM | POA: Diagnosis not present

## 2016-12-18 DIAGNOSIS — E1122 Type 2 diabetes mellitus with diabetic chronic kidney disease: Secondary | ICD-10-CM | POA: Diagnosis not present

## 2016-12-18 DIAGNOSIS — N183 Chronic kidney disease, stage 3 (moderate): Secondary | ICD-10-CM | POA: Diagnosis not present

## 2016-12-18 DIAGNOSIS — J449 Chronic obstructive pulmonary disease, unspecified: Secondary | ICD-10-CM | POA: Diagnosis not present

## 2016-12-18 DIAGNOSIS — I10 Essential (primary) hypertension: Secondary | ICD-10-CM | POA: Diagnosis not present

## 2016-12-18 DIAGNOSIS — F331 Major depressive disorder, recurrent, moderate: Secondary | ICD-10-CM | POA: Diagnosis not present

## 2016-12-18 DIAGNOSIS — Z9189 Other specified personal risk factors, not elsewhere classified: Secondary | ICD-10-CM | POA: Diagnosis not present

## 2016-12-18 DIAGNOSIS — E039 Hypothyroidism, unspecified: Secondary | ICD-10-CM | POA: Diagnosis not present

## 2016-12-23 DIAGNOSIS — Z0001 Encounter for general adult medical examination with abnormal findings: Secondary | ICD-10-CM | POA: Diagnosis not present

## 2016-12-23 DIAGNOSIS — I10 Essential (primary) hypertension: Secondary | ICD-10-CM | POA: Diagnosis not present

## 2016-12-23 DIAGNOSIS — E039 Hypothyroidism, unspecified: Secondary | ICD-10-CM | POA: Diagnosis not present

## 2016-12-23 DIAGNOSIS — E1122 Type 2 diabetes mellitus with diabetic chronic kidney disease: Secondary | ICD-10-CM | POA: Diagnosis not present

## 2016-12-23 DIAGNOSIS — J301 Allergic rhinitis due to pollen: Secondary | ICD-10-CM | POA: Diagnosis not present

## 2016-12-23 DIAGNOSIS — J449 Chronic obstructive pulmonary disease, unspecified: Secondary | ICD-10-CM | POA: Diagnosis not present

## 2016-12-23 DIAGNOSIS — Z23 Encounter for immunization: Secondary | ICD-10-CM | POA: Diagnosis not present

## 2016-12-23 DIAGNOSIS — G4733 Obstructive sleep apnea (adult) (pediatric): Secondary | ICD-10-CM | POA: Diagnosis not present

## 2016-12-23 DIAGNOSIS — K7581 Nonalcoholic steatohepatitis (NASH): Secondary | ICD-10-CM | POA: Diagnosis not present

## 2016-12-23 DIAGNOSIS — Z1212 Encounter for screening for malignant neoplasm of rectum: Secondary | ICD-10-CM | POA: Diagnosis not present

## 2016-12-23 DIAGNOSIS — E782 Mixed hyperlipidemia: Secondary | ICD-10-CM | POA: Diagnosis not present

## 2016-12-23 DIAGNOSIS — F331 Major depressive disorder, recurrent, moderate: Secondary | ICD-10-CM | POA: Diagnosis not present

## 2017-02-04 DIAGNOSIS — J449 Chronic obstructive pulmonary disease, unspecified: Secondary | ICD-10-CM | POA: Diagnosis not present

## 2017-02-04 DIAGNOSIS — Z9189 Other specified personal risk factors, not elsewhere classified: Secondary | ICD-10-CM | POA: Diagnosis not present

## 2017-02-04 DIAGNOSIS — E039 Hypothyroidism, unspecified: Secondary | ICD-10-CM | POA: Diagnosis not present

## 2017-02-04 DIAGNOSIS — I10 Essential (primary) hypertension: Secondary | ICD-10-CM | POA: Diagnosis not present

## 2017-02-04 DIAGNOSIS — N183 Chronic kidney disease, stage 3 (moderate): Secondary | ICD-10-CM | POA: Diagnosis not present

## 2017-02-04 DIAGNOSIS — E119 Type 2 diabetes mellitus without complications: Secondary | ICD-10-CM | POA: Diagnosis not present

## 2017-02-04 DIAGNOSIS — E1122 Type 2 diabetes mellitus with diabetic chronic kidney disease: Secondary | ICD-10-CM | POA: Diagnosis not present

## 2017-02-04 DIAGNOSIS — E782 Mixed hyperlipidemia: Secondary | ICD-10-CM | POA: Diagnosis not present

## 2017-02-04 DIAGNOSIS — F331 Major depressive disorder, recurrent, moderate: Secondary | ICD-10-CM | POA: Diagnosis not present

## 2017-04-21 DIAGNOSIS — Z720 Tobacco use: Secondary | ICD-10-CM | POA: Diagnosis not present

## 2017-04-21 DIAGNOSIS — E1122 Type 2 diabetes mellitus with diabetic chronic kidney disease: Secondary | ICD-10-CM | POA: Diagnosis not present

## 2017-04-21 DIAGNOSIS — E782 Mixed hyperlipidemia: Secondary | ICD-10-CM | POA: Diagnosis not present

## 2017-04-21 DIAGNOSIS — K7581 Nonalcoholic steatohepatitis (NASH): Secondary | ICD-10-CM | POA: Diagnosis not present

## 2017-04-21 DIAGNOSIS — N183 Chronic kidney disease, stage 3 (moderate): Secondary | ICD-10-CM | POA: Diagnosis not present

## 2017-04-21 DIAGNOSIS — E039 Hypothyroidism, unspecified: Secondary | ICD-10-CM | POA: Diagnosis not present

## 2017-04-21 DIAGNOSIS — I10 Essential (primary) hypertension: Secondary | ICD-10-CM | POA: Diagnosis not present

## 2017-04-21 DIAGNOSIS — Z9189 Other specified personal risk factors, not elsewhere classified: Secondary | ICD-10-CM | POA: Diagnosis not present

## 2017-04-21 DIAGNOSIS — F331 Major depressive disorder, recurrent, moderate: Secondary | ICD-10-CM | POA: Diagnosis not present

## 2017-04-23 DIAGNOSIS — Z1389 Encounter for screening for other disorder: Secondary | ICD-10-CM | POA: Diagnosis not present

## 2017-04-23 DIAGNOSIS — E782 Mixed hyperlipidemia: Secondary | ICD-10-CM | POA: Diagnosis not present

## 2017-04-23 DIAGNOSIS — E039 Hypothyroidism, unspecified: Secondary | ICD-10-CM | POA: Diagnosis not present

## 2017-04-23 DIAGNOSIS — I10 Essential (primary) hypertension: Secondary | ICD-10-CM | POA: Diagnosis not present

## 2017-04-23 DIAGNOSIS — E1122 Type 2 diabetes mellitus with diabetic chronic kidney disease: Secondary | ICD-10-CM | POA: Diagnosis not present

## 2017-04-23 DIAGNOSIS — K7581 Nonalcoholic steatohepatitis (NASH): Secondary | ICD-10-CM | POA: Diagnosis not present

## 2017-04-23 DIAGNOSIS — J449 Chronic obstructive pulmonary disease, unspecified: Secondary | ICD-10-CM | POA: Diagnosis not present

## 2017-04-23 DIAGNOSIS — Z6836 Body mass index (BMI) 36.0-36.9, adult: Secondary | ICD-10-CM | POA: Diagnosis not present

## 2017-08-18 DIAGNOSIS — I10 Essential (primary) hypertension: Secondary | ICD-10-CM | POA: Diagnosis not present

## 2017-08-18 DIAGNOSIS — E1122 Type 2 diabetes mellitus with diabetic chronic kidney disease: Secondary | ICD-10-CM | POA: Diagnosis not present

## 2017-08-18 DIAGNOSIS — Z9189 Other specified personal risk factors, not elsewhere classified: Secondary | ICD-10-CM | POA: Diagnosis not present

## 2017-08-18 DIAGNOSIS — E782 Mixed hyperlipidemia: Secondary | ICD-10-CM | POA: Diagnosis not present

## 2017-08-18 DIAGNOSIS — F331 Major depressive disorder, recurrent, moderate: Secondary | ICD-10-CM | POA: Diagnosis not present

## 2017-08-18 DIAGNOSIS — E039 Hypothyroidism, unspecified: Secondary | ICD-10-CM | POA: Diagnosis not present

## 2017-08-18 DIAGNOSIS — K7581 Nonalcoholic steatohepatitis (NASH): Secondary | ICD-10-CM | POA: Diagnosis not present

## 2017-08-18 DIAGNOSIS — J9611 Chronic respiratory failure with hypoxia: Secondary | ICD-10-CM | POA: Diagnosis not present

## 2017-08-18 DIAGNOSIS — N183 Chronic kidney disease, stage 3 (moderate): Secondary | ICD-10-CM | POA: Diagnosis not present

## 2017-08-21 DIAGNOSIS — J449 Chronic obstructive pulmonary disease, unspecified: Secondary | ICD-10-CM | POA: Diagnosis not present

## 2017-08-21 DIAGNOSIS — E1122 Type 2 diabetes mellitus with diabetic chronic kidney disease: Secondary | ICD-10-CM | POA: Diagnosis not present

## 2017-08-21 DIAGNOSIS — K7581 Nonalcoholic steatohepatitis (NASH): Secondary | ICD-10-CM | POA: Diagnosis not present

## 2017-08-21 DIAGNOSIS — Z6835 Body mass index (BMI) 35.0-35.9, adult: Secondary | ICD-10-CM | POA: Diagnosis not present

## 2017-08-21 DIAGNOSIS — I10 Essential (primary) hypertension: Secondary | ICD-10-CM | POA: Diagnosis not present

## 2017-08-21 DIAGNOSIS — J9611 Chronic respiratory failure with hypoxia: Secondary | ICD-10-CM | POA: Diagnosis not present

## 2017-08-21 DIAGNOSIS — Z23 Encounter for immunization: Secondary | ICD-10-CM | POA: Diagnosis not present

## 2017-12-12 DIAGNOSIS — Z23 Encounter for immunization: Secondary | ICD-10-CM | POA: Diagnosis not present

## 2018-01-27 ENCOUNTER — Other Ambulatory Visit: Payer: Self-pay

## 2018-03-22 DIAGNOSIS — E1122 Type 2 diabetes mellitus with diabetic chronic kidney disease: Secondary | ICD-10-CM | POA: Diagnosis not present

## 2018-03-22 DIAGNOSIS — Z9189 Other specified personal risk factors, not elsewhere classified: Secondary | ICD-10-CM | POA: Diagnosis not present

## 2018-03-22 DIAGNOSIS — J449 Chronic obstructive pulmonary disease, unspecified: Secondary | ICD-10-CM | POA: Diagnosis not present

## 2018-03-22 DIAGNOSIS — E039 Hypothyroidism, unspecified: Secondary | ICD-10-CM | POA: Diagnosis not present

## 2018-03-22 DIAGNOSIS — E782 Mixed hyperlipidemia: Secondary | ICD-10-CM | POA: Diagnosis not present

## 2018-03-22 DIAGNOSIS — I10 Essential (primary) hypertension: Secondary | ICD-10-CM | POA: Diagnosis not present

## 2018-03-22 DIAGNOSIS — G4733 Obstructive sleep apnea (adult) (pediatric): Secondary | ICD-10-CM | POA: Diagnosis not present

## 2018-03-25 DIAGNOSIS — I10 Essential (primary) hypertension: Secondary | ICD-10-CM | POA: Diagnosis not present

## 2018-03-25 DIAGNOSIS — Z1389 Encounter for screening for other disorder: Secondary | ICD-10-CM | POA: Diagnosis not present

## 2018-03-25 DIAGNOSIS — Z6834 Body mass index (BMI) 34.0-34.9, adult: Secondary | ICD-10-CM | POA: Diagnosis not present

## 2018-03-25 DIAGNOSIS — Z0001 Encounter for general adult medical examination with abnormal findings: Secondary | ICD-10-CM | POA: Diagnosis not present

## 2018-05-03 DIAGNOSIS — M85852 Other specified disorders of bone density and structure, left thigh: Secondary | ICD-10-CM | POA: Diagnosis not present

## 2018-05-03 DIAGNOSIS — M85851 Other specified disorders of bone density and structure, right thigh: Secondary | ICD-10-CM | POA: Diagnosis not present

## 2018-05-03 DIAGNOSIS — M810 Age-related osteoporosis without current pathological fracture: Secondary | ICD-10-CM | POA: Diagnosis not present

## 2018-05-06 DIAGNOSIS — E039 Hypothyroidism, unspecified: Secondary | ICD-10-CM | POA: Diagnosis not present

## 2018-06-07 DIAGNOSIS — I10 Essential (primary) hypertension: Secondary | ICD-10-CM | POA: Diagnosis not present

## 2018-06-07 DIAGNOSIS — E039 Hypothyroidism, unspecified: Secondary | ICD-10-CM | POA: Diagnosis not present

## 2018-07-08 DIAGNOSIS — E782 Mixed hyperlipidemia: Secondary | ICD-10-CM | POA: Diagnosis not present

## 2018-07-08 DIAGNOSIS — E039 Hypothyroidism, unspecified: Secondary | ICD-10-CM | POA: Diagnosis not present

## 2018-07-20 DIAGNOSIS — E1122 Type 2 diabetes mellitus with diabetic chronic kidney disease: Secondary | ICD-10-CM | POA: Diagnosis not present

## 2018-07-20 DIAGNOSIS — E782 Mixed hyperlipidemia: Secondary | ICD-10-CM | POA: Diagnosis not present

## 2018-07-20 DIAGNOSIS — I10 Essential (primary) hypertension: Secondary | ICD-10-CM | POA: Diagnosis not present

## 2018-07-20 DIAGNOSIS — E039 Hypothyroidism, unspecified: Secondary | ICD-10-CM | POA: Diagnosis not present

## 2018-07-20 DIAGNOSIS — N183 Chronic kidney disease, stage 3 (moderate): Secondary | ICD-10-CM | POA: Diagnosis not present

## 2018-07-23 DIAGNOSIS — E039 Hypothyroidism, unspecified: Secondary | ICD-10-CM | POA: Diagnosis not present

## 2018-07-23 DIAGNOSIS — J9611 Chronic respiratory failure with hypoxia: Secondary | ICD-10-CM | POA: Diagnosis not present

## 2018-07-23 DIAGNOSIS — I10 Essential (primary) hypertension: Secondary | ICD-10-CM | POA: Diagnosis not present

## 2018-07-23 DIAGNOSIS — Z6835 Body mass index (BMI) 35.0-35.9, adult: Secondary | ICD-10-CM | POA: Diagnosis not present

## 2018-07-23 DIAGNOSIS — L400 Psoriasis vulgaris: Secondary | ICD-10-CM | POA: Diagnosis not present

## 2018-07-23 DIAGNOSIS — K7581 Nonalcoholic steatohepatitis (NASH): Secondary | ICD-10-CM | POA: Diagnosis not present

## 2018-07-23 DIAGNOSIS — E1122 Type 2 diabetes mellitus with diabetic chronic kidney disease: Secondary | ICD-10-CM | POA: Diagnosis not present

## 2018-07-23 DIAGNOSIS — J449 Chronic obstructive pulmonary disease, unspecified: Secondary | ICD-10-CM | POA: Diagnosis not present

## 2018-09-03 DIAGNOSIS — E039 Hypothyroidism, unspecified: Secondary | ICD-10-CM | POA: Diagnosis not present

## 2018-10-12 DIAGNOSIS — N183 Chronic kidney disease, stage 3 (moderate): Secondary | ICD-10-CM | POA: Diagnosis not present

## 2018-10-12 DIAGNOSIS — E039 Hypothyroidism, unspecified: Secondary | ICD-10-CM | POA: Diagnosis not present

## 2018-10-12 DIAGNOSIS — I10 Essential (primary) hypertension: Secondary | ICD-10-CM | POA: Diagnosis not present

## 2018-10-12 DIAGNOSIS — E1122 Type 2 diabetes mellitus with diabetic chronic kidney disease: Secondary | ICD-10-CM | POA: Diagnosis not present

## 2018-10-12 DIAGNOSIS — E782 Mixed hyperlipidemia: Secondary | ICD-10-CM | POA: Diagnosis not present

## 2018-10-15 DIAGNOSIS — J449 Chronic obstructive pulmonary disease, unspecified: Secondary | ICD-10-CM | POA: Diagnosis not present

## 2018-10-15 DIAGNOSIS — J9611 Chronic respiratory failure with hypoxia: Secondary | ICD-10-CM | POA: Diagnosis not present

## 2018-10-15 DIAGNOSIS — K7581 Nonalcoholic steatohepatitis (NASH): Secondary | ICD-10-CM | POA: Diagnosis not present

## 2018-10-15 DIAGNOSIS — F331 Major depressive disorder, recurrent, moderate: Secondary | ICD-10-CM | POA: Diagnosis not present

## 2018-10-15 DIAGNOSIS — E039 Hypothyroidism, unspecified: Secondary | ICD-10-CM | POA: Diagnosis not present

## 2018-10-15 DIAGNOSIS — E1122 Type 2 diabetes mellitus with diabetic chronic kidney disease: Secondary | ICD-10-CM | POA: Diagnosis not present

## 2018-10-15 DIAGNOSIS — I10 Essential (primary) hypertension: Secondary | ICD-10-CM | POA: Diagnosis not present

## 2018-10-15 DIAGNOSIS — E782 Mixed hyperlipidemia: Secondary | ICD-10-CM | POA: Diagnosis not present

## 2018-11-22 DIAGNOSIS — Z23 Encounter for immunization: Secondary | ICD-10-CM | POA: Diagnosis not present

## 2018-12-30 DIAGNOSIS — H903 Sensorineural hearing loss, bilateral: Secondary | ICD-10-CM | POA: Diagnosis not present

## 2018-12-30 DIAGNOSIS — Z77122 Contact with and (suspected) exposure to noise: Secondary | ICD-10-CM | POA: Diagnosis not present

## 2018-12-30 DIAGNOSIS — H9313 Tinnitus, bilateral: Secondary | ICD-10-CM | POA: Diagnosis not present

## 2019-01-26 DIAGNOSIS — H903 Sensorineural hearing loss, bilateral: Secondary | ICD-10-CM | POA: Diagnosis not present

## 2019-01-27 DIAGNOSIS — R002 Palpitations: Secondary | ICD-10-CM | POA: Diagnosis not present

## 2019-01-27 DIAGNOSIS — Z6837 Body mass index (BMI) 37.0-37.9, adult: Secondary | ICD-10-CM | POA: Diagnosis not present

## 2019-01-30 DIAGNOSIS — I480 Paroxysmal atrial fibrillation: Secondary | ICD-10-CM | POA: Diagnosis not present

## 2019-02-22 ENCOUNTER — Encounter: Payer: Self-pay | Admitting: *Deleted

## 2019-02-23 ENCOUNTER — Ambulatory Visit (INDEPENDENT_AMBULATORY_CARE_PROVIDER_SITE_OTHER): Payer: Medicare Other | Admitting: Cardiology

## 2019-02-23 ENCOUNTER — Encounter: Payer: Self-pay | Admitting: Cardiology

## 2019-02-23 ENCOUNTER — Telehealth: Payer: Self-pay | Admitting: Cardiology

## 2019-02-23 ENCOUNTER — Other Ambulatory Visit: Payer: Self-pay

## 2019-02-23 ENCOUNTER — Encounter: Payer: Self-pay | Admitting: *Deleted

## 2019-02-23 VITALS — BP 142/83 | HR 112 | Ht 65.0 in | Wt 224.2 lb

## 2019-02-23 DIAGNOSIS — R000 Tachycardia, unspecified: Secondary | ICD-10-CM | POA: Diagnosis not present

## 2019-02-23 DIAGNOSIS — I4891 Unspecified atrial fibrillation: Secondary | ICD-10-CM

## 2019-02-23 MED ORDER — METOPROLOL SUCCINATE ER 100 MG PO TB24: ORAL_TABLET | ORAL | 1 refills | Status: DC

## 2019-02-23 MED ORDER — ELIQUIS 5 MG PO TABS: 5.0000 mg | ORAL_TABLET | Freq: Two times a day (BID) | ORAL | 1 refills | Status: DC

## 2019-02-23 NOTE — Telephone Encounter (Signed)
  Precert needed for:  ECHO Location:  [facility]    Date: Feb 24, 2019

## 2019-02-23 NOTE — Patient Instructions (Signed)
Your physician recommends that you schedule a follow-up appointment in: [place] has recommended you make the following change in your medication:   INCREASE TOPROL XL 150 MG DAILY   Your physician has requested that you have an echocardiogram. Echocardiography is a painless test that uses sound waves to create images of your heart. It provides your doctor with information about the size and shape of your heart and how well your heart's chambers and valves are working. This procedure takes approximately one hour. There are no restrictions for this procedure.  Thank you for choosing [facility]!!

## 2019-02-23 NOTE — Addendum Note (Signed)
Addended by: Merlene Laughter on: 02/23/2019 03:51 PM   Modules accepted: Orders

## 2019-02-23 NOTE — Progress Notes (Signed)
Clinical Summary [name] is a 83 y.o.female seen today as a new consult, referred by Dr Quillian Quince for new diagnosis of afib.     1. PAF - afib noted by 01/26/2019 preop ekg - at pcp f/u she was back in SR.  - denies any significant palpitations.   CHADS2 Vasc score 5 (HTN, age x 2, DM, gender) - pcp started eliquis 5 mg bid    Past Medical History:  Diagnosis Date  . Anxiety   . Cancer 1976   bilaterl thyroid ca  . Diabetes mellitus without complication   . GERD (gastroesophageal reflux disease)   . Headache(784.0)    hx migraines  . Hypertension   . Hypothyroidism   . Shortness of breath   . Sinus congestion   . Sleep apnea    study done in [place], [place], use CPAP     No Known Allergies   Current Outpatient Medications  Medication Sig Dispense Refill  . amLODipine (NORVASC) 10 MG tablet Take 10 mg by mouth daily.    Marland Kitchen aspirin EC 81 MG tablet Take 81 mg by mouth daily.    . citalopram (CELEXA) 20 MG tablet Take 20 mg by mouth at bedtime.    Marland Kitchen doxepin (SINEQUAN) 25 MG capsule Take 25 mg by mouth.    Marland Kitchen glipiZIDE (GLUCOTROL) 5 MG tablet Take 5 mg by mouth daily before breakfast.    . levothyroxine (SYNTHROID) 112 MCG tablet Take 112 mcg by mouth daily before breakfast.    . losartan-hydrochlorothiazide (HYZAAR) 100-12.5 MG per tablet Take 1 tablet by mouth daily.    . metFORMIN (GLUCOPHAGE) 500 MG tablet Take 500 mg by mouth 2 (two) times daily with a meal.    . metoprolol succinate (TOPROL-XL) 100 MG 24 hr tablet Take 100 mg by mouth daily. Take with or immediately following a meal.    . oxyCODONE (ROXICODONE) 5 MG immediate release tablet Take 1 tablet (5 mg total) by mouth every 4 (four) hours as needed for severe pain. 30 tablet 0  . simvastatin (ZOCOR) 40 MG tablet Take 40 mg by mouth daily.     No current facility-administered medications for this visit.     Past Surgical History:  Procedure Laterality Date  . APPENDECTOMY    . CHOLECYSTECTOMY    . OPEN  REDUCTION INTERNAL FIXATION (ORIF) TIBIA/FIBULA FRACTURE Right 11/03/2013   Procedure: OPEN REDUCTION INTERNAL FIXATION (ORIF) RIGHT TIBIA/FIBULA PILON FRACTURE;  Surgeon: Wylene Simmer, MD;  Location: [facility];  Service: Orthopedics;  Laterality: Right;  . OVARIAN CYST SURGERY Left    age 63  . THYROID EXPLORATION    . TUBAL LIGATION       No Known Allergies    No family history on file.   Social History [name] reports that she quit smoking about 7 years ago. Her smoking use included cigarettes. She has a 90.00 pack-year smoking history. She has never used smokeless tobacco. [name] reports no history of alcohol use.   Review of Systems CONSTITUTIONAL: No weight loss, fever, chills, weakness or fatigue.  HEENT: Eyes: No visual loss, blurred vision, double vision or yellow sclerae.No hearing loss, sneezing, congestion, runny nose or sore throat.  SKIN: No rash or itching.  CARDIOVASCULAR: per hpi RESPIRATORY: No shortness of breath, cough or sputum.  GASTROINTESTINAL: No anorexia, nausea, vomiting or diarrhea. No abdominal pain or blood.  GENITOURINARY: No burning on urination, no polyuria NEUROLOGICAL: No headache, dizziness, syncope, paralysis, ataxia, numbness or tingling  in the extremities. No change in bowel or bladder control.  MUSCULOSKELETAL: No muscle, back pain, joint pain or stiffness.  LYMPHATICS: No enlarged nodes. No history of splenectomy.  PSYCHIATRIC: No history of depression or anxiety.  ENDOCRINOLOGIC: No reports of sweating, cold or heat intolerance. No polyuria or polydipsia.  Marland Kitchen   Physical Examination Today's Vitals   02/23/19 1449 02/23/19 1459 02/23/19 1501  BP: 110/66 (!) 142/83   Pulse: (!) 118 (!) 109 (!) 112  SpO2: 95% 95%   Weight: 224 lb 3.2 oz (101.7 kg)    Height: 5\' 5"  (1.651 m)     Body mass index is 37.31 kg/m.  Gen: resting comfortably, no acute distress HEENT: no scleral icterus, pupils equal round and reactive, no palptable  cervical adenopathy,  CV: irreg, no mr/g, no jvd Resp: Clear to auscultation bilaterally GI: abdomen is soft, non-tender, non-distended, normal bowel sounds, no hepatosplenomegaly MSK: extremities are warm, no edema.  Skin: warm, no rash Neuro:  no focal deficits Psych: appropriate affect    Assessment and Plan  1. PAF - new diagnosis of afib by preop ekg, at her pcp f/u she was back in SR, today by EKG back in afib with rate 110s. She is paroxysmal, no indication for DCCV - increase toprol to 150mg  daily - already on eliquis, continue  - obtain echo in setting of new afib  F/u 4 months   Arnoldo Lenis, M.D.

## 2019-02-24 ENCOUNTER — Ambulatory Visit (INDEPENDENT_AMBULATORY_CARE_PROVIDER_SITE_OTHER): Payer: Medicare Other

## 2019-02-24 DIAGNOSIS — I4891 Unspecified atrial fibrillation: Secondary | ICD-10-CM | POA: Diagnosis not present

## 2019-03-01 ENCOUNTER — Telehealth: Payer: Self-pay | Admitting: *Deleted

## 2019-03-01 NOTE — Telephone Encounter (Signed)
Left detailed message on home answering machine - routed to pcp

## 2019-03-01 NOTE — Telephone Encounter (Signed)
-----   Message from Arnoldo Lenis, MD sent at 02/25/2019  8:08 AM EST ----- Echo looks good, normal heart function   Zandra Abts MD

## 2019-03-22 DIAGNOSIS — Z23 Encounter for immunization: Secondary | ICD-10-CM | POA: Diagnosis not present

## 2019-03-25 DIAGNOSIS — N183 Chronic kidney disease, stage 3 unspecified: Secondary | ICD-10-CM | POA: Diagnosis not present

## 2019-03-25 DIAGNOSIS — E039 Hypothyroidism, unspecified: Secondary | ICD-10-CM | POA: Diagnosis not present

## 2019-03-25 DIAGNOSIS — E1122 Type 2 diabetes mellitus with diabetic chronic kidney disease: Secondary | ICD-10-CM | POA: Diagnosis not present

## 2019-03-25 DIAGNOSIS — E782 Mixed hyperlipidemia: Secondary | ICD-10-CM | POA: Diagnosis not present

## 2019-03-29 DIAGNOSIS — J9611 Chronic respiratory failure with hypoxia: Secondary | ICD-10-CM | POA: Diagnosis not present

## 2019-03-29 DIAGNOSIS — I10 Essential (primary) hypertension: Secondary | ICD-10-CM | POA: Diagnosis not present

## 2019-03-29 DIAGNOSIS — K7581 Nonalcoholic steatohepatitis (NASH): Secondary | ICD-10-CM | POA: Diagnosis not present

## 2019-03-29 DIAGNOSIS — L400 Psoriasis vulgaris: Secondary | ICD-10-CM | POA: Diagnosis not present

## 2019-03-29 DIAGNOSIS — E1122 Type 2 diabetes mellitus with diabetic chronic kidney disease: Secondary | ICD-10-CM | POA: Diagnosis not present

## 2019-03-29 DIAGNOSIS — Z6837 Body mass index (BMI) 37.0-37.9, adult: Secondary | ICD-10-CM | POA: Diagnosis not present

## 2019-03-29 DIAGNOSIS — J449 Chronic obstructive pulmonary disease, unspecified: Secondary | ICD-10-CM | POA: Diagnosis not present

## 2019-03-29 DIAGNOSIS — Z0001 Encounter for general adult medical examination with abnormal findings: Secondary | ICD-10-CM | POA: Diagnosis not present

## 2019-04-08 ENCOUNTER — Ambulatory Visit: Payer: Medicare Other

## 2019-04-14 ENCOUNTER — Ambulatory Visit: Payer: Medicare Other

## 2019-04-18 DIAGNOSIS — Z23 Encounter for immunization: Secondary | ICD-10-CM | POA: Diagnosis not present

## 2019-06-24 ENCOUNTER — Encounter: Payer: Self-pay | Admitting: Cardiology

## 2019-06-24 ENCOUNTER — Other Ambulatory Visit: Payer: Self-pay

## 2019-06-24 ENCOUNTER — Ambulatory Visit (INDEPENDENT_AMBULATORY_CARE_PROVIDER_SITE_OTHER): Payer: Medicare Other | Admitting: Cardiology

## 2019-06-24 VITALS — BP 118/74 | HR 88 | Ht 65.0 in | Wt 226.8 lb

## 2019-06-24 DIAGNOSIS — I4891 Unspecified atrial fibrillation: Secondary | ICD-10-CM

## 2019-06-24 DIAGNOSIS — R0602 Shortness of breath: Secondary | ICD-10-CM | POA: Diagnosis not present

## 2019-06-24 NOTE — Progress Notes (Signed)
Clinical Summary [name] is a 84 y.o.female seen today for follow up of the following medical problems.   1. PAF - afib noted by 01/26/2019 preop ekg - no recent palpitations. Has intermittent SOB/DOE at times, some days worst then other.  - compliant withmeds  Past Medical History:  Diagnosis Date  . Anxiety   . Cancer ([place]) 1976   bilaterl thyroid ca  . Diabetes mellitus without complication ([place])   . GERD (gastroesophageal reflux disease)   . Headache(784.0)    hx migraines  . Hypertension   . Hypothyroidism   . Shortness of breath   . Sinus congestion   . Sleep apnea    study done in [place], [place], use CPAP     No Known Allergies   Current Outpatient Medications  Medication Sig Dispense Refill  . amLODipine (NORVASC) 5 MG tablet Take 1 tablet by mouth daily.    . citalopram (CELEXA) 20 MG tablet Take 20 mg by mouth at bedtime.    Marland Kitchen doxepin (SINEQUAN) 25 MG capsule Take 50 mg by mouth at bedtime.     Marland Kitchen ELIQUIS 5 MG TABS tablet Take 1 tablet (5 mg total) by mouth 2 (two) times daily. 180 tablet 1  . levothyroxine (SYNTHROID) 112 MCG tablet Take 112 mcg by mouth daily before breakfast.    . losartan-hydrochlorothiazide (HYZAAR) 100-12.5 MG per tablet Take 1 tablet by mouth daily.    . metFORMIN (GLUCOPHAGE) 500 MG tablet Take 1,000 mg by mouth every evening.     . metoprolol succinate (TOPROL-XL) 100 MG 24 hr tablet TAKE 1 AND 1/2 TABLETS DAILY 135 tablet 1  . simvastatin (ZOCOR) 40 MG tablet Take 20 mg by mouth daily.      No current facility-administered medications for this visit.     Past Surgical History:  Procedure Laterality Date  . APPENDECTOMY    . CHOLECYSTECTOMY    . OPEN REDUCTION INTERNAL FIXATION (ORIF) TIBIA/FIBULA FRACTURE Right 11/03/2013   Procedure: OPEN REDUCTION INTERNAL FIXATION (ORIF) RIGHT TIBIA/FIBULA PILON FRACTURE;  Surgeon: Wylene Simmer, MD;  Location: [facility];  Service: Orthopedics;  Laterality: Right;  . OVARIAN CYST SURGERY Left    age 43  . THYROID EXPLORATION    . TUBAL LIGATION       No Known Allergies    Family History  Problem Relation Age of Onset  . Stroke Mother        brain hemmorhage  . Diabetes Mother   . Heart disease Mother   . Thyroid disease Mother   . Arrhythmia Mother   . Stroke Father   . Heart attack Father        blood clot in brain  . Stroke Sister   . Cancer Sister   . Stomach cancer Brother   . Hypertension Brother   . Stroke Brother   . Hypertension Brother   . Arrhythmia Sister   . Hypertension Sister      Social History [name] reports that she quit smoking about 7 years ago. Her smoking use included cigarettes. She has a 90.00 pack-year smoking history. She has never used smokeless tobacco. [name] reports no history of alcohol use.   Review of Systems CONSTITUTIONAL: No weight loss, fever, chills, weakness or fatigue.  HEENT: Eyes: No visual loss, blurred vision, double vision or yellow sclerae.No hearing loss, sneezing, congestion, runny nose or sore throat.  SKIN: No rash or itching.  CARDIOVASCULAR: per hpi RESPIRATORY: per hpi GASTROINTESTINAL: No anorexia,  nausea, vomiting or diarrhea. No abdominal pain or blood.  GENITOURINARY: No burning on urination, no polyuria NEUROLOGICAL: No headache, dizziness, syncope, paralysis, ataxia, numbness or tingling in the extremities. No change in bowel or bladder control.  MUSCULOSKELETAL: No muscle, back pain, joint pain or stiffness.  LYMPHATICS: No enlarged nodes. No history of splenectomy.  PSYCHIATRIC: No history of depression or anxiety.  ENDOCRINOLOGIC: No reports of sweating, cold or heat intolerance. No polyuria or polydipsia.  Marland Kitchen   Physical Examination Today's Vitals   06/24/19 1328  BP: 118/74  Pulse: 88  SpO2: 98%  Weight: 226 lb 12.8 oz (102.9 kg)  Height: 5\' 5"  (1.651 m)   Body mass index is 37.74 kg/m.  Gen: resting comfortably, no acute distress HEENT: no scleral icterus, pupils equal  round and reactive, no palptable cervical adenopathy,  CV: irreg, tachy, no m/r/g, no jvd Resp: Clear to auscultation bilaterally GI: abdomen is soft, non-tender, non-distended, normal bowel sounds, no hepatosplenomegaly MSK: extremities are warm, no edema.  Skin: warm, no rash Neuro:  no focal deficits Psych: appropriate affect   Diagnostic Studies  02/2019 echo IMPRESSIONS    1. Left ventricular ejection fraction, by visual estimation, is 70 to  75%. The left ventricle has hyperdynamic function. There is no left  ventricular hypertrophy.  2. Left ventricular diastolic parameters are indeterminate.  3. The left ventricle has no regional wall motion abnormalities.  4. Global right ventricle has normal systolic function.The right  ventricular size is normal. No increase in right ventricular wall  thickness.  5. Left atrial size was normal.  6. Right atrial size was normal.  7. The mitral valve is normal in structure. Trivial mitral valve  regurgitation. No evidence of mitral stenosis.  8. The tricuspid valve is normal in structure. Tricuspid valve  regurgitation is not demonstrated.  9. The aortic valve has an indeterminant number of cusps. Aortic valve  regurgitation is not visualized. Mild aortic valve sclerosis without  stenosis.  10. The pulmonic valve was not well visualized. Pulmonic valve  regurgitation is not visualized.  11. Normal pulmonary artery systolic pressure.    Assessment and Plan   1. PAF - has not had palpitations, unclear if her episodic SOB is related to her arrhythmia. She was in afib with elevated rates last visit when we increased her toprol - EKG today shows rate controlled afib   - plan for 7 day monitor to further assess control of her PAF    Arnoldo Lenis, M.D

## 2019-06-24 NOTE — Patient Instructions (Signed)
Your physician recommends that you schedule a follow-up appointment in: [place] recommends that you continue on your current medications as directed. Please refer to the Current Medication list given to you today.  Your physician has recommended that you wear an event monitor FOR 7 DAYS. Event monitors are medical devices that record the heart's electrical activity. Doctors most often [place] these monitors to diagnose arrhythmias. Arrhythmias are problems with the speed or rhythm of the heartbeat. The monitor is a small, portable device. You can wear one while you do your normal daily activities. This is usually used to diagnose what is causing palpitations/syncope (passing out).  Thank you for choosing [facility]!!

## 2019-07-05 ENCOUNTER — Encounter (INDEPENDENT_AMBULATORY_CARE_PROVIDER_SITE_OTHER): Payer: Medicare Other

## 2019-07-05 DIAGNOSIS — I4891 Unspecified atrial fibrillation: Secondary | ICD-10-CM

## 2019-08-01 DIAGNOSIS — E039 Hypothyroidism, unspecified: Secondary | ICD-10-CM | POA: Diagnosis not present

## 2019-08-01 DIAGNOSIS — E782 Mixed hyperlipidemia: Secondary | ICD-10-CM | POA: Diagnosis not present

## 2019-08-01 DIAGNOSIS — I10 Essential (primary) hypertension: Secondary | ICD-10-CM | POA: Diagnosis not present

## 2019-08-01 DIAGNOSIS — R946 Abnormal results of thyroid function studies: Secondary | ICD-10-CM | POA: Diagnosis not present

## 2019-08-01 DIAGNOSIS — N183 Chronic kidney disease, stage 3 unspecified: Secondary | ICD-10-CM | POA: Diagnosis not present

## 2019-08-01 DIAGNOSIS — J449 Chronic obstructive pulmonary disease, unspecified: Secondary | ICD-10-CM | POA: Diagnosis not present

## 2019-08-04 DIAGNOSIS — Z23 Encounter for immunization: Secondary | ICD-10-CM | POA: Diagnosis not present

## 2019-08-04 DIAGNOSIS — E782 Mixed hyperlipidemia: Secondary | ICD-10-CM | POA: Diagnosis not present

## 2019-08-04 DIAGNOSIS — I10 Essential (primary) hypertension: Secondary | ICD-10-CM | POA: Diagnosis not present

## 2019-08-04 DIAGNOSIS — J9611 Chronic respiratory failure with hypoxia: Secondary | ICD-10-CM | POA: Diagnosis not present

## 2019-08-04 DIAGNOSIS — J449 Chronic obstructive pulmonary disease, unspecified: Secondary | ICD-10-CM | POA: Diagnosis not present

## 2019-08-04 DIAGNOSIS — K7581 Nonalcoholic steatohepatitis (NASH): Secondary | ICD-10-CM | POA: Diagnosis not present

## 2019-08-04 DIAGNOSIS — E1122 Type 2 diabetes mellitus with diabetic chronic kidney disease: Secondary | ICD-10-CM | POA: Diagnosis not present

## 2019-08-04 DIAGNOSIS — Z6837 Body mass index (BMI) 37.0-37.9, adult: Secondary | ICD-10-CM | POA: Diagnosis not present

## 2019-08-05 ENCOUNTER — Encounter: Payer: Self-pay | Admitting: Cardiology

## 2019-08-05 ENCOUNTER — Ambulatory Visit (INDEPENDENT_AMBULATORY_CARE_PROVIDER_SITE_OTHER): Payer: Medicare Other | Admitting: Cardiology

## 2019-08-05 ENCOUNTER — Other Ambulatory Visit: Payer: Self-pay

## 2019-08-05 VITALS — BP 130/60 | HR 92 | Ht 65.0 in | Wt 230.0 lb

## 2019-08-05 DIAGNOSIS — R0602 Shortness of breath: Secondary | ICD-10-CM

## 2019-08-05 DIAGNOSIS — I4891 Unspecified atrial fibrillation: Secondary | ICD-10-CM | POA: Diagnosis not present

## 2019-08-05 NOTE — Progress Notes (Signed)
Clinical Summary [name] is a 84 y.o.female seen today for follow up of the following medical problems.   1. PAF - afib noted by 01/26/2019 preop ekg - no recent palpitations. Has intermittent SOB/DOE at times, some days worst then other.  - recent monitor showed rate controlled afib/aflutter       Past Medical History:  Diagnosis Date  . Anxiety   . Cancer ([place]) 1976   bilaterl thyroid ca  . Diabetes mellitus without complication ([place])   . GERD (gastroesophageal reflux disease)   . Headache(784.0)    hx migraines  . Hypertension   . Hypothyroidism   . Shortness of breath   . Sinus congestion   . Sleep apnea    study done in [place], [place], use CPAP     No Known Allergies   Current Outpatient Medications  Medication Sig Dispense Refill  . amLODipine (NORVASC) 5 MG tablet Take 1 tablet by mouth daily.    . citalopram (CELEXA) 20 MG tablet Take 20 mg by mouth at bedtime.    Marland Kitchen doxepin (SINEQUAN) 25 MG capsule Take 50 mg by mouth at bedtime.     Marland Kitchen ELIQUIS 5 MG TABS tablet Take 1 tablet (5 mg total) by mouth 2 (two) times daily. 180 tablet 1  . levothyroxine (SYNTHROID) 112 MCG tablet Take 112 mcg by mouth daily before breakfast.    . losartan-hydrochlorothiazide (HYZAAR) 100-12.5 MG per tablet Take 1 tablet by mouth daily.    . metFORMIN (GLUCOPHAGE) 500 MG tablet Take 1,000 mg by mouth every evening.     . metoprolol succinate (TOPROL-XL) 100 MG 24 hr tablet TAKE 1 AND 1/2 TABLETS DAILY 135 tablet 1  . simvastatin (ZOCOR) 40 MG tablet Take 20 mg by mouth daily.      No current facility-administered medications for this visit.     Past Surgical History:  Procedure Laterality Date  . APPENDECTOMY    . CHOLECYSTECTOMY    . OPEN REDUCTION INTERNAL FIXATION (ORIF) TIBIA/FIBULA FRACTURE Right 11/03/2013   Procedure: OPEN REDUCTION INTERNAL FIXATION (ORIF) RIGHT TIBIA/FIBULA PILON FRACTURE;  Surgeon: Wylene Simmer, MD;  Location: [facility];  Service: Orthopedics;   Laterality: Right;  . OVARIAN CYST SURGERY Left    age 62  . THYROID EXPLORATION    . TUBAL LIGATION       No Known Allergies    Family History  Problem Relation Age of Onset  . Stroke Mother        brain hemmorhage  . Diabetes Mother   . Heart disease Mother   . Thyroid disease Mother   . Arrhythmia Mother   . Stroke Father   . Heart attack Father        blood clot in brain  . Stroke Sister   . Cancer Sister   . Stomach cancer Brother   . Hypertension Brother   . Stroke Brother   . Hypertension Brother   . Arrhythmia Sister   . Hypertension Sister      Social History [name] reports that she quit smoking about 7 years ago. Her smoking use included cigarettes. She has a 90.00 pack-year smoking history. She has never used smokeless tobacco. [name] reports no history of alcohol use.   Review of Systems CONSTITUTIONAL: No weight loss, fever, chills, weakness or fatigue.  HEENT: Eyes: No visual loss, blurred vision, double vision or yellow sclerae.No hearing loss, sneezing, congestion, runny nose or sore throat.  SKIN: No rash or itching.  CARDIOVASCULAR: per hpi RESPIRATORY: +SOB GASTROINTESTINAL: No anorexia, nausea, vomiting or diarrhea. No abdominal pain or blood.  GENITOURINARY: No burning on urination, no polyuria NEUROLOGICAL: No headache, dizziness, syncope, paralysis, ataxia, numbness or tingling in the extremities. No change in bowel or bladder control.  MUSCULOSKELETAL: No muscle, back pain, joint pain or stiffness.  LYMPHATICS: No enlarged nodes. No history of splenectomy.  PSYCHIATRIC: No history of depression or anxiety.  ENDOCRINOLOGIC: No reports of sweating, cold or heat intolerance. No polyuria or polydipsia.  Marland Kitchen   Physical Examination Today's Vitals   08/05/19 1321  BP: 130/60  Pulse: 92  SpO2: 99%  Weight: 230 lb (104.3 kg)  Height: 5\' 5"  (1.651 m)   Body mass index is 38.27 kg/m.  Gen: resting comfortably, no acute  distress HEENT: no scleral icterus, pupils equal round and reactive, no palptable cervical adenopathy,  CV: irreg, no m/r/g, no jvd Resp: Clear to auscultation bilaterally GI: abdomen is soft, non-tender, non-distended, normal bowel sounds, no hepatosplenomegaly MSK: extremities are warm, no edema.  Skin: warm, no rash Neuro:  no focal deficits Psych: appropriate affect   Diagnostic Studies  02/2019 echo IMPRESSIONS    1. Left ventricular ejection fraction, by visual estimation, is 70 to  75%. The left ventricle has hyperdynamic function. There is no left  ventricular hypertrophy.  2. Left ventricular diastolic parameters are indeterminate.  3. The left ventricle has no regional wall motion abnormalities.  4. Global right ventricle has normal systolic function.The right  ventricular size is normal. No increase in right ventricular wall  thickness.  5. Left atrial size was normal.  6. Right atrial size was normal.  7. The mitral valve is normal in structure. Trivial mitral valve  regurgitation. No evidence of mitral stenosis.  8. The tricuspid valve is normal in structure. Tricuspid valve  regurgitation is not demonstrated.  9. The aortic valve has an indeterminant number of cusps. Aortic valve  regurgitation is not visualized. Mild aortic valve sclerosis without  stenosis.  10. The pulmonic valve was not well visualized. Pulmonic valve  regurgitation is not visualized.  11. Normal pulmonary artery systolic pressure.     Assessment and Plan  1. PAF/SOB - has not had palpitations, episodic SOB ongoing. We obtained an event monitor to see if symptoms related to uncontrolled afib, however monitor overall looks good. Perhaps her SOB is lung related, long tobacco history without prior testing, will order PFTs - if completely negative workup may consider rhythm strategy if it appears she is symptomatic even in rate controlled afib      Arnoldo Lenis, M.D.

## 2019-08-05 NOTE — Patient Instructions (Signed)
Your physician recommends that you schedule a follow-up appointment in: 4 MONTHS WITH DR BRANCH  Your physician recommends that you continue on your current medications as directed. Please refer to the Current Medication list given to you today.  Your physician has recommended that you have a pulmonary function test. Pulmonary Function Tests are a group of tests that measure how well air moves in and out of your lungs.   Thank you for choosing [facility]!!     

## 2019-08-06 ENCOUNTER — Other Ambulatory Visit: Payer: Self-pay | Admitting: Cardiology

## 2019-08-24 ENCOUNTER — Ambulatory Visit (INDEPENDENT_AMBULATORY_CARE_PROVIDER_SITE_OTHER): Payer: Medicare Other | Admitting: Internal Medicine

## 2019-08-24 ENCOUNTER — Other Ambulatory Visit: Payer: Self-pay

## 2019-08-24 DIAGNOSIS — R0602 Shortness of breath: Secondary | ICD-10-CM | POA: Diagnosis not present

## 2019-08-24 LAB — PULMONARY FUNCTION TEST
DL/VA % pred: 101 %
DL/VA: 4.08 ml/min/mmHg/L
DLCO cor % pred: 77 %
DLCO cor: 14.88 ml/min/mmHg
DLCO unc % pred: 77 %
DLCO unc: 14.88 ml/min/mmHg
FEF 25-75 Post: 1.22 L/sec
FEF 25-75 Pre: 0.67 L/sec
FEF2575-%Change-Post: 82 %
FEF2575-%Pred-Post: 89 %
FEF2575-%Pred-Pre: 48 %
FEV1-%Change-Post: 15 %
FEV1-%Pred-Post: 73 %
FEV1-%Pred-Pre: 64 %
FEV1-Post: 1.45 L
FEV1-Pre: 1.26 L
FEV1FVC-%Change-Post: 7 %
FEV1FVC-%Pred-Pre: 91 %
FEV6-%Change-Post: 10 %
FEV6-%Pred-Post: 80 %
FEV6-%Pred-Pre: 72 %
FEV6-Post: 2.02 L
FEV6-Pre: 1.82 L
FEV6FVC-%Change-Post: 2 %
FEV6FVC-%Pred-Post: 105 %
FEV6FVC-%Pred-Pre: 102 %
FVC-%Change-Post: 7 %
FVC-%Pred-Post: 76 %
FVC-%Pred-Pre: 70 %
FVC-Post: 2.02 L
FVC-Pre: 1.88 L
Post FEV1/FVC ratio: 72 %
Post FEV6/FVC ratio: 100 %
Pre FEV1/FVC ratio: 67 %
Pre FEV6/FVC Ratio: 97 %
RV % pred: 112 %
RV: 2.8 L
TLC % pred: 90 %
TLC: 4.74 L

## 2019-08-24 NOTE — Progress Notes (Signed)
Full PFT performed today. °

## 2019-08-30 ENCOUNTER — Telehealth: Payer: Self-pay | Admitting: *Deleted

## 2019-08-30 DIAGNOSIS — R942 Abnormal results of pulmonary function studies: Secondary | ICD-10-CM

## 2019-08-30 DIAGNOSIS — R0602 Shortness of breath: Secondary | ICD-10-CM

## 2019-08-30 NOTE — Telephone Encounter (Signed)
Pt voiced understanding and agreeable to referral - orders placed and will forward to schedulers

## 2019-08-30 NOTE — Telephone Encounter (Signed)
-----   Message from Arnoldo Lenis, MD sent at 08/30/2019 10:28 AM EDT ----- PFTs do show evidence of COPD, which may be the cause of her symptoms. Can we refer to pulmonary in [place] for COPD and SOB   Zandra Abts MD

## 2019-09-02 ENCOUNTER — Telehealth: Payer: Self-pay | Admitting: Cardiology

## 2019-09-02 NOTE — Telephone Encounter (Signed)
Miranda Wilkinson -daughter called stating that patient is really having a lot of shortness of breath. Patient has upcoming appointment with Dr. Melvyn Novas on 10/07/2019. Family is very concerned about the shortness of breath. They are wanting to see if we can refer to another group of pulmonologist.  Please call Miranda Wilkinson [phone]

## 2019-09-26 ENCOUNTER — Encounter: Payer: Self-pay | Admitting: Internal Medicine

## 2019-09-26 ENCOUNTER — Ambulatory Visit (INDEPENDENT_AMBULATORY_CARE_PROVIDER_SITE_OTHER): Payer: Medicare Other | Admitting: Internal Medicine

## 2019-09-26 ENCOUNTER — Other Ambulatory Visit: Payer: Self-pay

## 2019-09-26 ENCOUNTER — Ambulatory Visit (HOSPITAL_COMMUNITY)
Admission: RE | Admit: 2019-09-26 | Discharge: 2019-09-26 | Disposition: A | Discharge status: Home / Self Care | Payer: Medicare Other | Source: Ambulatory Visit | Attending: Internal Medicine | Admitting: Internal Medicine

## 2019-09-26 DIAGNOSIS — J449 Chronic obstructive pulmonary disease, unspecified: Secondary | ICD-10-CM

## 2019-09-26 DIAGNOSIS — I10 Essential (primary) hypertension: Secondary | ICD-10-CM

## 2019-09-26 DIAGNOSIS — R0602 Shortness of breath: Secondary | ICD-10-CM | POA: Diagnosis not present

## 2019-09-26 MED ORDER — ANORO ELLIPTA 62.5-25 MCG/INH IN AEPB: INHALATION_SPRAY | RESPIRATORY_TRACT | 11 refills | Status: DC

## 2019-09-26 MED ORDER — ANORO ELLIPTA 62.5-25 MCG/INH IN AEPB
1.0000 | INHALATION_SPRAY | Freq: Every day | RESPIRATORY_TRACT | 0 refills | Status: DC
Start: 2019-09-26 — End: 2019-09-26

## 2019-09-26 NOTE — Progress Notes (Signed)
Miranda Wilkinson, female    DOB: 12-23-1935,    MRN: [id number]   Brief patient profile:  34 yowf from [place] quit smoking 2013 with no symptoms then doe onset around 2019 With pfts 08/24/19 with minimal airflow obst but main finding = ERV 13% and referred to pulmonary clinic 09/26/2019 by Dr   Quillian Quince       History of Present Illness  09/26/2019  Pulmonary/ 1st office eval/Heidi Maclin  Chief Complaint  Patient presents with  . Consult    Patient is here for shortness of breath with exertion that has recently got worse. Feels like legs and knees get tight and make it hard to breath. Recently diagnosed with AFIB. Denies cough but has a lot of congestion. Patient wears CPAP and has some COPD  Dyspnea:  Does w/c at [facility] x sev months   Cough: no Sleep: flat / 2 pillows  SABA use: none   Extremely hoh so not clear she understood questions:  No obvious day to day or daytime variability or assoc excess/ purulent sputum or mucus plugs or hemoptysis or cp or chest tightness, subjective wheeze or overt sinus or hb symptoms.   Sleeping apparently flat  without nocturnal  or early am exacerbation  of respiratory  c/o's or need for noct saba. Also denies any obvious fluctuation of symptoms with weather or environmental changes or other aggravating or alleviating factors except as outlined above   No unusual exposure hx or h/o childhood pna/ asthma or knowledge of premature birth.  Current Allergies, Complete Past Medical History, Past Surgical History, Family History, and Social History were reviewed in [employer] record.  ROS  The following are not active complaints unless bolded Hoarseness, sore throat, dysphagia, dental problems, itching, sneezing,  nasal congestion or discharge of excess mucus or purulent secretions, ear ache,   fever, chills, sweats, unintended wt loss or wt gain, classically pleuritic or exertional cp,  orthopnea pnd or arm/hand swelling  or leg swelling,  presyncope, palpitations, abdominal pain, anorexia, nausea, vomiting, diarrhea  or change in bowel habits or change in bladder habits, change in stools or change in urine, dysuria, hematuria,  rash, arthralgias, visual complaints, headache, numbness, weakness or ataxia or problems with walking or coordination,  change in mood or  memory.           Past Medical History:  Diagnosis Date  . Anxiety   . Cancer ([place]) 1976   bilaterl thyroid ca  . Diabetes mellitus without complication ([place])   . GERD (gastroesophageal reflux disease)   . Headache(784.0)    hx migraines  . Hypertension   . Hypothyroidism   . Shortness of breath   . Sinus congestion   . Sleep apnea    study done in [place], [place], use CPAP    Outpatient Medications Prior to Visit  Medication Sig Dispense Refill  . amLODipine (NORVASC) 5 MG tablet Take 1 tablet by mouth daily.    . citalopram (CELEXA) 20 MG tablet Take 20 mg by mouth at bedtime.    Marland Kitchen doxepin (SINEQUAN) 25 MG capsule Take 50 mg by mouth at bedtime.     Marland Kitchen ELIQUIS 5 MG TABS tablet TAKE 1 TABLET TWICE A DAY 180 tablet 3  . glipiZIDE (GLUCOTROL) 10 MG tablet Take 10 mg by mouth daily before breakfast.    . levothyroxine (SYNTHROID) 112 MCG tablet Take 112 mcg by mouth daily before breakfast.    . losartan-hydrochlorothiazide (HYZAAR) 100-12.5 MG per tablet  Take 1 tablet by mouth daily.    . metFORMIN (GLUCOPHAGE) 500 MG tablet Take 1,000 mg by mouth every evening.     . metoprolol succinate (TOPROL-XL) 100 MG 24 hr tablet TAKE 1 AND 1/2 TABLETS DAILY 135 tablet 1  . simvastatin (ZOCOR) 40 MG tablet Take 20 mg by mouth daily.      No facility-administered medications prior to visit.     Objective:     BP 130/60 (BP Location: Left Arm, Patient Position: Sitting, Cuff Size: Large)   Pulse (!) 117   Temp 98 F (36.7 C) (Oral)   Ht 5\' 5"  (1.651 m)   Wt 230 lb (104.3 kg)   SpO2 92% Comment: on room air  BMI 38.27 kg/m   SpO2: 92 % (on room air)  Obese wf  nad     HEENT : pt wearing mask not removed for exam due to covid - 19 concerns.   NECK :  without JVD/Nodes/TM/ nl carotid upstrokes bilaterally   LUNGS: no acc muscle use,  Min barrel  contour chest wall with bilateral  Distant end exp wheeze and  without cough on insp or exp maneuvers and min  Hyperresonant  to  percussion bilaterally     CV:  IRIR apical pulse 120-130   no s3 or murmur or increase in P2, and no edema   ABD:  soft and nontender with pos end  insp Hoover's  in the supine position. No bruits or organomegaly appreciated, bowel sounds nl  MS:   Nl gait/  ext warm without deformities, calf tenderness, cyanosis or clubbing No obvious joint restrictions   SKIN: warm and dry without lesions    NEURO:  alert, approp, nl sensorium with  no motor or cerebellar deficits apparent.         CXR PA and Lateral:   09/26/2019 :    I personally reviewed images and agree with radiology impression as follows:  Mildly progressive changes of COPD and chronic bronchitis. No acute abnormality.       Assessment   COPD GOLD 0/ group B Quit smoking 2013 - Echo 02/24/2019 with nl LA, nl RA and PASP  - PFT's  08/24/2019  FEV1 1.45 (73 % ) ratio 0.72  p 15 % improvement from saba p nothing prior to study with DLCO  14.88 (77%) corrects to 4.08 (101 %)  for alv volume and FV curve mild concavity but main finding = ERV 13%  - 09/26/2019  After extensive coaching inhaler device,  effectiveness =    90% > try anoro  If she has copd at all :  Pt is Group B in terms of symptom/risk and laba/lama therefore appropriate rx at this point >>>  Trial of anoro reasonable unless increases RVR  >>> Also if improves on anoro or wheezing increases: consider change Beta blocker to bisoprolol (see sep a/p).      Morbid obesity due to excess calories ([place]) comp by hyperlipidemia/ dm/ hbp Body mass index is 38.27 kg/m.    No results found for: TSH   Contributing to gerd risk/ doe/reviewed the need and  the process to achieve and maintain neg calorie balance > defer f/u primary care including intermittently monitoring thyroid status       Essential hypertension, benign In the setting of respiratory symptoms of unknown etiology,  It would be preferable to use bystolic, the most beta -1  selective Beta blocker available in sample form, with bisoprolol the most selective generic  choice  on the market, at least on a trial basis, to make sure the spillover Beta 2 effects of the less specific Beta blockers are not contributing to this patient's symptoms.    Each maintenance medication was reviewed in detail including emphasizing most importantly the difference between maintenance and prns and under what circumstances the prns are to be triggered using an action plan format where appropriate.  Total time for H and P, chart review, counseling, teaching device/, directly observing portions of ambulatory 02 saturation study/  and generating customized AVS unique to this office visit / charting = 50 min         Christinia Gully, MD 09/26/2019

## 2019-09-26 NOTE — Assessment & Plan Note (Signed)
Body mass index is 38.27 kg/m.    No results found for: TSH   Contributing to gerd risk/ doe/reviewed the need and the process to achieve and maintain neg calorie balance > defer f/u primary care including intermittently monitoring thyroid status           Each maintenance medication was reviewed in detail including emphasizing most importantly the difference between maintenance and prns and under what circumstances the prns are to be triggered using an action plan format where appropriate.  Total time for H and P, chart review, counseling, teaching device/, directly observing portions of ambulatory 02 saturation study/  and generating customized AVS unique to this office visit / charting = 50 min

## 2019-09-26 NOTE — Assessment & Plan Note (Addendum)
Quit smoking 2013 - Echo 02/24/2019 with nl LA, nl RA and PASP  - PFT's  08/24/2019  FEV1 1.45 (73 % ) ratio 0.72  p 15 % improvement from saba p nothing prior to study with DLCO  14.88 (77%) corrects to 4.08 (101 %)  for alv volume and FV curve mild concavity but main finding = ERV 13%  - 09/26/2019  After extensive coaching inhaler device,  effectiveness =    90% > try anoro  If she has copd at all :  Pt is Group B in terms of symptom/risk and laba/lama therefore appropriate rx at this point >>>  Trial of anoro reasonable unless increases RVR   >>> Also if improves on anoro or wheezing increases: In the setting of respiratory symptoms of unknown etiology,  It would be preferable to use   bisoprolol the most selective generic choice  on the market, at least on a trial basis, to make sure the spillover Beta 2 effects of the less specific Beta blockers are not contributing to this patient's symptoms.

## 2019-09-26 NOTE — Patient Instructions (Addendum)
Try anoro one click each am and take two good drags off it to be sure you got it all   This should help your  activity tolerance  (like high octane fuel)   Make sure you check your oxygen saturations at highest level of activity to be sure it stays over 90%  And let me know if trending down  Please remember to go to the lab department   for your tests - we will call you with the results when they are available.     Pulmonary follow up is as needed

## 2019-09-28 NOTE — Progress Notes (Signed)
Left detailed msg ok per DPR

## 2019-09-28 NOTE — Assessment & Plan Note (Signed)
In the setting of respiratory symptoms of unknown etiology,  It would be preferable to use bystolic, the most beta -1  selective Beta blocker available in sample form, with bisoprolol the most selective generic choice  on the market, at least on a trial basis, to make sure the spillover Beta 2 effects of the less specific Beta blockers are not contributing to this patient's symptoms.

## 2019-10-04 ENCOUNTER — Encounter (HOSPITAL_COMMUNITY): Payer: Medicare Other

## 2019-10-07 ENCOUNTER — Institutional Professional Consult (permissible substitution): Payer: Medicare Other | Admitting: Internal Medicine

## 2019-10-09 NOTE — Progress Notes (Signed)
Cardiology Office Note  Date: 10/10/2019   ID: Esly, Selvage [birth year], MRN [id number]  PCP:  Caryl Bis, MD  Cardiologist:  Carlyle Dolly, MD Electrophysiologist:  None   Chief Complaint: Atrial fibrillation   History of Present Illness: Miranda Wilkinson is a 84 y.o. female with a history of PAF, HTN, GERD, hypothyroidism, shortness of breath, sinus congestion, sleep apnea.  Last saw Dr. Harl Bowie on 08/05/2019.  Had experienced no recent palpitations.  Having intermittent shortness of breath/DOE ED at times, some days worse than other.  Recent monitor showed rate controlled atrial fib/atrial flutter.  Dr. Harl Bowie thought perhaps her shortness of breath was lung related due to long history of tobacco smoking without prior testing.  PFTs were ordered.  He stated if this was a completely negative work-up may consider rhythm strategy if she appears she is symptomatic even in rate controlled A. Fib.  Patient saw Dr. Melvyn Novas on 09/26/2019 for shortness of breath.  He prescribed a trial of Anoro which he deemed would be reasonable unless increases RVR and atrial fibrillation.  Also if she improved on Anoro or wheezes increase, he recommended considering change beta-blocker to bisoprolol.  Dr. Melvyn Novas mentioned if she has COPD at all patient is group A in terms of symptoms/risk and lab/lama therefore appropriate Rx at this point would be a trial of Anoro.  She presents today continuing to complain of shortness of breath and fatigue.  Heart rate on arrival was 139.  EKG was performed and demonstrated atrial fibrillation with RVR 115 bpm, nonspecific ST and T wave abnormality.  Patient states she cannot tell she is in atrial fibrillation nor can she sense  fast heart rhythm/rate.  At last visit her Toprol was increased to 150 mg.  She states she has been taking the medication as directed.  Her daughter is here with her.  She states she does not feel the Toprol is controlling her heart rate.  Past  Medical History:  Diagnosis Date  . Anxiety   . Cancer ([place]) 1976   bilaterl thyroid ca  . Diabetes mellitus without complication ([place])   . GERD (gastroesophageal reflux disease)   . Headache(784.0)    hx migraines  . Hypertension   . Hypothyroidism   . Shortness of breath   . Sinus congestion   . Sleep apnea    study done in [place], [place], use CPAP    Past Surgical History:  Procedure Laterality Date  . APPENDECTOMY    . CHOLECYSTECTOMY    . OPEN REDUCTION INTERNAL FIXATION (ORIF) TIBIA/FIBULA FRACTURE Right 11/03/2013   Procedure: OPEN REDUCTION INTERNAL FIXATION (ORIF) RIGHT TIBIA/FIBULA PILON FRACTURE;  Surgeon: Wylene Simmer, MD;  Location: [facility];  Service: Orthopedics;  Laterality: Right;  . OVARIAN CYST SURGERY Left    age 63  . THYROID EXPLORATION    . TUBAL LIGATION      Current Outpatient Medications  Medication Sig Dispense Refill  . citalopram (CELEXA) 20 MG tablet Take 20 mg by mouth at bedtime.    Marland Kitchen doxepin (SINEQUAN) 25 MG capsule Take 25 mg by mouth as needed (for sleep).     Marland Kitchen ELIQUIS 5 MG TABS tablet TAKE 1 TABLET TWICE A DAY 180 tablet 3  . glipiZIDE (GLUCOTROL) 10 MG tablet Take 10 mg by mouth daily before breakfast.    . levothyroxine (SYNTHROID) 112 MCG tablet Take 112 mcg by mouth daily before breakfast.    . losartan-hydrochlorothiazide (HYZAAR) 100-12.5 MG per tablet Take  1 tablet by mouth daily.    . metFORMIN (GLUCOPHAGE) 500 MG tablet Take 1,000 mg by mouth every evening.     . simvastatin (ZOCOR) 40 MG tablet Take 20 mg by mouth daily.     Marland Kitchen umeclidinium-vilanterol (ANORO ELLIPTA) 62.5-25 MCG/INH AEPB Only open the device one time and then take your two separate drags to be sure you get it all 1 each 11  . bisoprolol (ZEBETA) 5 MG tablet Take 1 tablet (5 mg total) by mouth daily. 30 tablet 6  . diltiazem (CARDIZEM CD) 180 MG 24 hr capsule Take 1 capsule (180 mg total) by mouth daily. 30 capsule 6   No current facility-administered medications for this  visit.   Allergies:  Patient has no known allergies.   Social History: The patient  reports that she quit smoking about 7 years ago. Her smoking use included cigarettes. She has a 90.00 pack-year smoking history. She has never used smokeless tobacco. She reports that she does not drink alcohol and does not use drugs.   Family History: The patient's family history includes Arrhythmia in her mother and sister; Cancer in her sister; Diabetes in her mother; Heart attack in her father; Heart disease in her mother; Hypertension in her brother, brother, and sister; Stomach cancer in her brother; Stroke in her brother, father, mother, and sister; Thyroid disease in her mother.   ROS:  Please see the history of present illness. Otherwise, complete review of systems is positive for none.  All other systems are reviewed and negative.   Physical Exam: VS:  BP (!) 156/50   Pulse (!) 139   Ht 5\' 5"  (1.651 m)   Wt 232 lb 12.8 oz (105.6 kg)   SpO2 98%   BMI 38.74 kg/m , BMI Body mass index is 38.74 kg/m.  Wt Readings from Last 3 Encounters:  10/10/19 232 lb 12.8 oz (105.6 kg)  09/26/19 230 lb (104.3 kg)  08/05/19 230 lb (104.3 kg)    General: Patient appears comfortable at rest. Neck: Supple, no elevated JVP or carotid bruits, no thyromegaly. Lungs: Clear to auscultation, nonlabored breathing at rest. Cardiac: Irregularly irregular tachycardic rate and rhythm, no S3 or significant systolic murmur, no pericardial rub. Extremities: No pitting edema, distal pulses 2+. Skin: Warm and dry. Musculoskeletal: No kyphosis. Neuropsychiatric: Alert and oriented x3, affect grossly appropriate.  ECG:  An ECG dated 10/10/2019 was personally reviewed today and demonstrated:  Atrial fibrillation with RVR rate of 115, nonspecific ST and T wave abnormality.  Recent Labwork: No results found for requested labs within last 8760 hours.  No results found for: CHOL, TRIG, HDL, CHOLHDL, VLDL, LDLCALC, LDLDIRECT  Other  Studies Reviewed Today: 02/2019 echo IMPRESSIONS    1. Left ventricular ejection fraction, by visual estimation, is 70 to  75%. The left ventricle has hyperdynamic function. There is no left  ventricular hypertrophy.  2. Left ventricular diastolic parameters are indeterminate.  3. The left ventricle has no regional wall motion abnormalities.  4. Global right ventricle has normal systolic function.The right  ventricular size is normal. No increase in right ventricular wall  thickness.  5. Left atrial size was normal.  6. Right atrial size was normal.  7. The mitral valve is normal in structure. Trivial mitral valve  regurgitation. No evidence of mitral stenosis.  8. The tricuspid valve is normal in structure. Tricuspid valve  regurgitation is not demonstrated.  9. The aortic valve has an indeterminant number of cusps. Aortic valve  regurgitation is  not visualized. Mild aortic valve sclerosis without  stenosis.  10. The pulmonic valve was not well visualized. Pulmonic valve  regurgitation is not visualized.  11. Normal pulmonary artery systolic pressure.    Assessment and Plan:  1. Atrial fibrillation, unspecified type ([place])   2. Essential hypertension, benign    1. Atrial fibrillation, unspecified type [facility]) Patient came in with rapid heart rate of 139 during nurse assessment.  EKG to confirm rate and rhythm shows atrial fibrillation with RVR rate of 115.  At last visit her Toprol was increased to 150 mg p.o. daily.  Daughter states she does not believe the Toprol is working to control heart rate.  We will stop Toprol and start Cardizem CD 180 mg p.o.  daily.  Continue Eliquis 5 mg p.o. twice daily.  Please refer patient to atrial fibrillation clinic.  2. Essential hypertension, benign Blood pressure is also elevated on arrival today at 156/50.  Continue losartan HCTZ 100/12.5 mg daily.  Add bisoprolol 5 mg daily.  Stop amlodipine. .  Medication Adjustments/Labs and  Tests Ordered: Current medicines are reviewed at length with the patient today.  Concerns regarding medicines are outlined above.   Disposition: Follow-up with Dr. Harl Bowie or APP 3 months  Signed, Levell July, NP 10/10/2019 4:05 PM    [facility] [place], [place], [place] [address] Phone: [phone]; Fax: [phone]

## 2019-10-10 ENCOUNTER — Other Ambulatory Visit: Payer: Self-pay

## 2019-10-10 ENCOUNTER — Ambulatory Visit (INDEPENDENT_AMBULATORY_CARE_PROVIDER_SITE_OTHER): Payer: Medicare Other | Admitting: Family Medicine

## 2019-10-10 ENCOUNTER — Encounter: Payer: Self-pay | Admitting: Family Medicine

## 2019-10-10 VITALS — BP 156/50 | HR 139 | Ht 65.0 in | Wt 232.8 lb

## 2019-10-10 DIAGNOSIS — I10 Essential (primary) hypertension: Secondary | ICD-10-CM

## 2019-10-10 DIAGNOSIS — I4891 Unspecified atrial fibrillation: Secondary | ICD-10-CM

## 2019-10-10 MED ORDER — DILTIAZEM HCL ER COATED BEADS 180 MG PO CP24: 180.0000 mg | ORAL_CAPSULE | Freq: Every day | ORAL | 6 refills | Status: DC

## 2019-10-10 MED ORDER — BISOPROLOL FUMARATE 5 MG PO TABS
5.0000 mg | ORAL_TABLET | Freq: Every day | ORAL | 6 refills | Status: DC
Start: 2019-10-10 — End: 2019-10-27

## 2019-10-10 NOTE — Patient Instructions (Addendum)
Medication Instructions:   Stop Toprol XL (Metoprolol Succ.)  Begin Cardizem CD 180mg  daily.   Stop Amlodipine (Norvasc).   Begin Bisoprolol 5mg  daily.   Continue all other medications.    Labwork: none  Testing/Procedures: none  Follow-Up: 3 months   Any Other Special Instructions Will Be Listed Below (If Applicable).  You have been referred to:  Atrial Fibrillation clinic  2 week nurse visit for EKG and vitals.    If you need a refill on your cardiac medications before your next appointment, please call your pharmacy.

## 2019-10-14 ENCOUNTER — Telehealth: Payer: Self-pay | Admitting: Pharmacist

## 2019-10-14 ENCOUNTER — Other Ambulatory Visit: Payer: Self-pay

## 2019-10-14 ENCOUNTER — Ambulatory Visit (HOSPITAL_COMMUNITY)
Admission: RE | Admit: 2019-10-14 | Discharge: 2019-10-14 | Disposition: A | Discharge status: Home / Self Care | Payer: Medicare Other | Source: Ambulatory Visit | Attending: Physician Assistant | Admitting: Physician Assistant

## 2019-10-14 VITALS — BP 140/64 | HR 95 | Ht 65.0 in | Wt 232.2 lb

## 2019-10-14 DIAGNOSIS — I4819 Other persistent atrial fibrillation: Secondary | ICD-10-CM | POA: Diagnosis not present

## 2019-10-14 DIAGNOSIS — Z7984 Long term (current) use of oral hypoglycemic drugs: Secondary | ICD-10-CM | POA: Diagnosis not present

## 2019-10-14 DIAGNOSIS — Z9989 Dependence on other enabling machines and devices: Secondary | ICD-10-CM | POA: Insufficient documentation

## 2019-10-14 DIAGNOSIS — Z79899 Other long term (current) drug therapy: Secondary | ICD-10-CM | POA: Diagnosis not present

## 2019-10-14 DIAGNOSIS — F419 Anxiety disorder, unspecified: Secondary | ICD-10-CM | POA: Diagnosis not present

## 2019-10-14 DIAGNOSIS — D6869 Other thrombophilia: Secondary | ICD-10-CM | POA: Diagnosis not present

## 2019-10-14 DIAGNOSIS — Z7901 Long term (current) use of anticoagulants: Secondary | ICD-10-CM | POA: Diagnosis not present

## 2019-10-14 DIAGNOSIS — K219 Gastro-esophageal reflux disease without esophagitis: Secondary | ICD-10-CM | POA: Insufficient documentation

## 2019-10-14 DIAGNOSIS — Z87891 Personal history of nicotine dependence: Secondary | ICD-10-CM | POA: Insufficient documentation

## 2019-10-14 DIAGNOSIS — Z8349 Family history of other endocrine, nutritional and metabolic diseases: Secondary | ICD-10-CM | POA: Insufficient documentation

## 2019-10-14 DIAGNOSIS — I10 Essential (primary) hypertension: Secondary | ICD-10-CM | POA: Insufficient documentation

## 2019-10-14 DIAGNOSIS — G4733 Obstructive sleep apnea (adult) (pediatric): Secondary | ICD-10-CM | POA: Insufficient documentation

## 2019-10-14 DIAGNOSIS — Z8249 Family history of ischemic heart disease and other diseases of the circulatory system: Secondary | ICD-10-CM | POA: Insufficient documentation

## 2019-10-14 DIAGNOSIS — Z9049 Acquired absence of other specified parts of digestive tract: Secondary | ICD-10-CM | POA: Diagnosis not present

## 2019-10-14 DIAGNOSIS — E119 Type 2 diabetes mellitus without complications: Secondary | ICD-10-CM | POA: Insufficient documentation

## 2019-10-14 DIAGNOSIS — Z85850 Personal history of malignant neoplasm of thyroid: Secondary | ICD-10-CM | POA: Insufficient documentation

## 2019-10-14 DIAGNOSIS — E669 Obesity, unspecified: Secondary | ICD-10-CM | POA: Diagnosis not present

## 2019-10-14 DIAGNOSIS — E039 Hypothyroidism, unspecified: Secondary | ICD-10-CM | POA: Insufficient documentation

## 2019-10-14 DIAGNOSIS — Z6838 Body mass index (BMI) 38.0-38.9, adult: Secondary | ICD-10-CM | POA: Diagnosis not present

## 2019-10-14 DIAGNOSIS — Z79890 Hormone replacement therapy (postmenopausal): Secondary | ICD-10-CM | POA: Insufficient documentation

## 2019-10-14 LAB — BASIC METABOLIC PANEL
Anion gap: 11 (ref 5–15)
BUN: 16 mg/dL (ref 8–23)
CO2: 23 mmol/L (ref 22–32)
Calcium: 9.6 mg/dL (ref 8.9–10.3)
Chloride: 98 mmol/L (ref 98–111)
Creatinine, Ser: 1.24 mg/dL — ABNORMAL HIGH (ref 0.44–1.00)
GFR calc Af Amer: 47 mL/min — ABNORMAL LOW (ref >60)
GFR calc non Af Amer: 40 mL/min — ABNORMAL LOW (ref >60)
Glucose, Bld: 230 mg/dL — ABNORMAL HIGH (ref 70–99)
Potassium: 4.9 mmol/L (ref 3.5–5.1)
Sodium: 132 mmol/L — ABNORMAL LOW (ref 135–145)

## 2019-10-14 LAB — MAGNESIUM: Magnesium: 2 mg/dL (ref 1.7–2.4)

## 2019-10-14 MED ORDER — LOSARTAN POTASSIUM 100 MG PO TABS
100.0000 mg | ORAL_TABLET | Freq: Every day | ORAL | 3 refills | Status: DC
Start: 2019-10-14 — End: 2019-11-03

## 2019-10-14 NOTE — Patient Instructions (Signed)
Stop losartan/hctz  Start losartan 100mg  once a day

## 2019-10-14 NOTE — Progress Notes (Signed)
Primary Care Physician: Caryl Bis, MD Primary Cardiologist: Dr Harl Bowie Primary Electrophysiologist: none Referring Physician: Levell July NP   Miranda Wilkinson is a 84 y.o. female with a history of persistent atrial fibrillation, HTN, hypothyroidism, GERD, DM, and OSA who presents for consultation in the [facility].  The patient was initially diagnosed with atrial fibrillation 01/2019 on a preoperative ECG. Patient is on Eliquis for a CHADS2VASC score of 5. She appears to have been in persistent afib since then with overall rate control. Patient has had intermittent symptoms of generalized weakness and SOB especially with exertion. She was seen by Levell July 10/10/19 and was in afib with RVR. She was changed from metoprolol to bisoprolol and diltiazem. She denies bleeding issues with anticoagulation.   Today, she denies symptoms of palpitations, chest pain, orthopnea, PND, lower extremity edema, dizziness, presyncope, syncope, bleeding, or neurologic sequela. The patient is tolerating medications without difficulties and is otherwise without complaint today.    Atrial Fibrillation Risk Factors:  she does have symptoms or diagnosis of sleep apnea. she is compliant with CPAP therapy. she does not have a history of rheumatic fever.   she has a BMI of Body mass index is 38.64 kg/m.Marland Kitchen Filed Weights   10/14/19 1005  Weight: 105.3 kg    Family History  Problem Relation Age of Onset  . Stroke Mother        brain hemmorhage  . Diabetes Mother   . Heart disease Mother   . Thyroid disease Mother   . Arrhythmia Mother   . Stroke Father   . Heart attack Father        blood clot in brain  . Stroke Sister   . Cancer Sister   . Stomach cancer Brother   . Hypertension Brother   . Stroke Brother   . Hypertension Brother   . Arrhythmia Sister   . Hypertension Sister      Atrial Fibrillation Management history:  Previous antiarrhythmic drugs:  none Previous cardioversions: none Previous ablations: none CHADS2VASC score: 5 Anticoagulation history: Eliquis   Past Medical History:  Diagnosis Date  . Anxiety   . Cancer ([place]) 1976   bilaterl thyroid ca  . Diabetes mellitus without complication ([place])   . GERD (gastroesophageal reflux disease)   . Headache(784.0)    hx migraines  . Hypertension   . Hypothyroidism   . Shortness of breath   . Sinus congestion   . Sleep apnea    study done in [place], [place], use CPAP   Past Surgical History:  Procedure Laterality Date  . APPENDECTOMY    . CHOLECYSTECTOMY    . OPEN REDUCTION INTERNAL FIXATION (ORIF) TIBIA/FIBULA FRACTURE Right 11/03/2013   Procedure: OPEN REDUCTION INTERNAL FIXATION (ORIF) RIGHT TIBIA/FIBULA PILON FRACTURE;  Surgeon: Wylene Simmer, MD;  Location: [facility];  Service: Orthopedics;  Laterality: Right;  . OVARIAN CYST SURGERY Left    age 40  . THYROID EXPLORATION    . TUBAL LIGATION      Current Outpatient Medications  Medication Sig Dispense Refill  . bisoprolol (ZEBETA) 5 MG tablet Take 1 tablet (5 mg total) by mouth daily. 30 tablet 6  . diltiazem (CARDIZEM CD) 180 MG 24 hr capsule Take 1 capsule (180 mg total) by mouth daily. 30 capsule 6  . doxepin (SINEQUAN) 25 MG capsule Take 25 mg by mouth as needed (for sleep).     Marland Kitchen ELIQUIS 5 MG TABS tablet TAKE 1 TABLET TWICE A DAY  180 tablet 3  . glipiZIDE (GLUCOTROL) 10 MG tablet Take 10 mg by mouth daily before breakfast.    . levothyroxine (SYNTHROID) 112 MCG tablet Take 112 mcg by mouth daily before breakfast.    . metFORMIN (GLUCOPHAGE) 500 MG tablet Take 1,000 mg by mouth every evening.     . simvastatin (ZOCOR) 40 MG tablet Take 20 mg by mouth daily.     Marland Kitchen umeclidinium-vilanterol (ANORO ELLIPTA) 62.5-25 MCG/INH AEPB Only open the device one time and then take your two separate drags to be sure you get it all (Patient taking differently: Inhale 1 puff into the lungs as needed. Only open the device one time and then take  your two separate drags to be sure you get it all) 1 each 11  . losartan (COZAAR) 100 MG tablet Take 1 tablet (100 mg total) by mouth daily. 30 tablet 3   No current facility-administered medications for this encounter.    No Known Allergies  Social History   Socioeconomic History  . Marital status: Single    Spouse name: Not on file  . Number of children: Not on file  . Years of education: Not on file  . Highest education level: Not on file  Occupational History  . Not on file  Tobacco Use  . Smoking status: Former Smoker    Packs/day: 1.50    Years: 60.00    Pack years: 90.00    Types: Cigarettes    Quit date: 11/03/2011    Years since quitting: 7.9  . Smokeless tobacco: Never Used  Vaping Use  . Vaping Use: Never used  Substance and Sexual Activity  . Alcohol use: No  . Drug use: No  . Sexual activity: Not on file  Other Topics Concern  . Not on file  Social History Narrative  . Not on file   Social Determinants of Health   Financial Resource Strain:   . Difficulty of Paying Living Expenses:   Food Insecurity:   . Worried About Charity fundraiser in the Last Year:   . Arboriculturist in the Last Year:   Transportation Needs:   . Film/video editor (Medical):   Marland Kitchen Lack of Transportation (Non-Medical):   Physical Activity:   . Days of Exercise per Week:   . Minutes of Exercise per Session:   Stress:   . Feeling of Stress :   Social Connections:   . Frequency of Communication with Friends and Family:   . Frequency of Social Gatherings with Friends and Family:   . Attends Religious Services:   . Active Member of Clubs or Organizations:   . Attends Archivist Meetings:   Marland Kitchen Marital Status:   Intimate Partner Violence:   . Fear of Current or Ex-Partner:   . Emotionally Abused:   Marland Kitchen Physically Abused:   . Sexually Abused:      ROS- All systems are reviewed and negative except as per the HPI above.  Physical Exam: Vitals:   10/14/19 1005   BP: 140/64  Pulse: 95  Weight: 105.3 kg  Height: 5\' 5"  (1.651 m)    GEN- The patient is well appearing obese elderly female, alert and oriented x 3 today.   Head- normocephalic, atraumatic Eyes-  Sclera clear, conjunctiva pink Ears- hard of hearing Oropharynx- clear Neck- supple  Lungs- Clear to ausculation bilaterally, normal work of breathing Heart- irregular rate and rhythm, no murmurs, rubs or gallops  GI- soft, NT, ND, + BS  Extremities- no clubbing, cyanosis, or edema MS- no significant deformity or atrophy Skin- no rash or lesion Psych- euthymic mood, full affect Neuro- strength and sensation are intact  Wt Readings from Last 3 Encounters:  10/14/19 105.3 kg  10/10/19 105.6 kg  09/26/19 104.3 kg    EKG today demonstrates afib HR 95, QRS 66, QTc 462  Echo 02/24/19 demonstrated  1. Left ventricular ejection fraction, by visual estimation, is 70 to  75%. The left ventricle has hyperdynamic function. There is no left  ventricular hypertrophy.  2. Left ventricular diastolic parameters are indeterminate.  3. The left ventricle has no regional wall motion abnormalities.  4. Global right ventricle has normal systolic function.The right  ventricular size is normal. No increase in right ventricular wall  thickness.  5. Left atrial size was normal.  6. Right atrial size was normal.  7. The mitral valve is normal in structure. Trivial mitral valve  regurgitation. No evidence of mitral stenosis.  8. The tricuspid valve is normal in structure. Tricuspid valve  regurgitation is not demonstrated.  9. The aortic valve has an indeterminant number of cusps. Aortic valve  regurgitation is not visualized. Mild aortic valve sclerosis without  stenosis.  10. The pulmonic valve was not well visualized. Pulmonic valve  regurgitation is not visualized.  11. Normal pulmonary artery systolic pressure.   Epic records are reviewed at length today  CHA2DS2-VASc Score = 5  The  patient's score is based upon: CHF History: 0 HTN History: 1 Age : 2 Diabetes History: 1 Stroke History: 0 Vascular Disease History: 0 Gender: 1      ASSESSMENT AND PLAN: 1. Persistent Atrial Fibrillation (ICD10:  I48.19) The patient's CHA2DS2-VASc score is 5, indicating a 7.2% annual risk of stroke.   Heart rate better controlled on diltiazem. We discussed therapeutic options today including rate vs rhythm control. Patient and family would like a trial of SR to see if her SOB and weakness improve. We discussed AAD +/- DCCV. Patient agreeable to dofetilide admission. QTc in SR 428 ms. Patient to check on the price of drug. Medication list sent to PharmD to screen for drug interactions. HCTZ stopped today.  Continue Eliquis 5 mg BID Continue bisoprolol 5 mg daily Continue diltiazem 180 mg daily Check bmet/mag  2. Secondary Hypercoagulable State (ICD10:  D68.69) The patient is at significant risk for stroke/thromboembolism based upon her CHA2DS2-VASc Score of 5.  Continue Apixaban (Eliquis).   3. Obesity Body mass index is 38.64 kg/m. Lifestyle modification was discussed at length including regular exercise and weight reduction.  4. Obstructive sleep apnea The importance of adequate treatment of sleep apnea was discussed today in order to improve our ability to maintain sinus rhythm long term. Patient reports compliance with CPAP therapy.  5. HTN Stable, Hyzaar stopped and losartan started in anticipation of dofetilide loading.    Follow up for dofetilide admission 10/24/19.   [facility] [address] [place], [place] [address] [phone] 10/14/2019 11:55 AM

## 2019-10-14 NOTE — Telephone Encounter (Signed)
Medication list reviewed in anticipation of upcoming Tikosyn initiation. Patient is taking doxepin prn for sleep which is QTc prolonging. Would recommend he stop therapy and ask his PCP for a different recommendation for sleep aid. HCTZ was stopped today. Anoro inhaler can also prolong QTc but should be ok to continue.  Patient is anticoagulated on Eliquis 5mg  BID on the appropriate dose. Please ensure that patient has not missed any anticoagulation doses in the 3 weeks prior to Tikosyn initiation.   Patient will need to be counseled to avoid use of Benadryl while on Tikosyn and in the 2-3 days prior to Tikosyn initiation.

## 2019-10-20 ENCOUNTER — Telehealth (HOSPITAL_COMMUNITY): Payer: Self-pay

## 2019-10-20 NOTE — Telephone Encounter (Signed)
Contacted patients daughter to have her mom stop the Doxepin which she takes as needed for sleep. She recently had to take Doxepin 2 weeks ago because she was not able to sleep.  Advised her to switch her to Melatonin OTC 5mg  at bedtime as needed for sleep. Her daughter states her mom woke up at 4am today and was feeling dizzy, B/p was 150/110 and her HR 108. She has been real anxious about this upcoming Tikosyn admission which is scheduled for next week. Her daughter was told to call back to let us know if she continues to feel poorly once she wakes up. Consulted with patients daughter and she verbalized understanding.

## 2019-10-21 ENCOUNTER — Other Ambulatory Visit (HOSPITAL_COMMUNITY)
Admission: RE | Admit: 2019-10-21 | Discharge: 2019-10-21 | Disposition: A | Discharge status: Home / Self Care | Payer: Medicare Other | Source: Ambulatory Visit | Attending: Physician Assistant | Admitting: Physician Assistant

## 2019-10-21 ENCOUNTER — Other Ambulatory Visit (HOSPITAL_COMMUNITY): Payer: Medicare Other

## 2019-10-21 DIAGNOSIS — Z20822 Contact with and (suspected) exposure to covid-19: Secondary | ICD-10-CM | POA: Insufficient documentation

## 2019-10-21 DIAGNOSIS — Z01812 Encounter for preprocedural laboratory examination: Secondary | ICD-10-CM | POA: Insufficient documentation

## 2019-10-21 LAB — SARS CORONAVIRUS 2 (TAT 6-24 HRS): SARS Coronavirus 2: NEGATIVE

## 2019-10-24 ENCOUNTER — Ambulatory Visit (HOSPITAL_COMMUNITY)
Admission: RE | Admit: 2019-10-24 | Discharge: 2019-10-24 | Disposition: A | Discharge status: Home / Self Care | Payer: Medicare Other | Source: Ambulatory Visit | Attending: Physician Assistant | Admitting: Physician Assistant

## 2019-10-24 ENCOUNTER — Other Ambulatory Visit: Payer: Self-pay

## 2019-10-24 ENCOUNTER — Inpatient Hospital Stay (HOSPITAL_COMMUNITY)
Admission: RE | Admit: 2019-10-24 | Discharge: 2019-10-27 | DRG: 309 | Disposition: A | Discharge status: Home / Self Care | Payer: Medicare Other | Attending: Cardiology | Admitting: Cardiology

## 2019-10-24 VITALS — BP 140/66 | HR 87 | Ht 65.0 in | Wt 236.6 lb

## 2019-10-24 DIAGNOSIS — J449 Chronic obstructive pulmonary disease, unspecified: Secondary | ICD-10-CM | POA: Diagnosis present

## 2019-10-24 DIAGNOSIS — I10 Essential (primary) hypertension: Secondary | ICD-10-CM | POA: Diagnosis present

## 2019-10-24 DIAGNOSIS — I4819 Other persistent atrial fibrillation: Secondary | ICD-10-CM | POA: Diagnosis present

## 2019-10-24 DIAGNOSIS — D6869 Other thrombophilia: Secondary | ICD-10-CM | POA: Diagnosis present

## 2019-10-24 DIAGNOSIS — Z7901 Long term (current) use of anticoagulants: Secondary | ICD-10-CM | POA: Diagnosis not present

## 2019-10-24 DIAGNOSIS — Z7984 Long term (current) use of oral hypoglycemic drugs: Secondary | ICD-10-CM | POA: Diagnosis not present

## 2019-10-24 DIAGNOSIS — Z6839 Body mass index (BMI) 39.0-39.9, adult: Secondary | ICD-10-CM

## 2019-10-24 DIAGNOSIS — K219 Gastro-esophageal reflux disease without esophagitis: Secondary | ICD-10-CM | POA: Diagnosis present

## 2019-10-24 DIAGNOSIS — E119 Type 2 diabetes mellitus without complications: Secondary | ICD-10-CM | POA: Diagnosis present

## 2019-10-24 DIAGNOSIS — G4733 Obstructive sleep apnea (adult) (pediatric): Secondary | ICD-10-CM | POA: Diagnosis present

## 2019-10-24 DIAGNOSIS — Z79899 Other long term (current) drug therapy: Secondary | ICD-10-CM

## 2019-10-24 DIAGNOSIS — Z87891 Personal history of nicotine dependence: Secondary | ICD-10-CM | POA: Diagnosis not present

## 2019-10-24 DIAGNOSIS — Z20822 Contact with and (suspected) exposure to covid-19: Secondary | ICD-10-CM | POA: Diagnosis present

## 2019-10-24 DIAGNOSIS — Z9049 Acquired absence of other specified parts of digestive tract: Secondary | ICD-10-CM | POA: Diagnosis not present

## 2019-10-24 DIAGNOSIS — Z8349 Family history of other endocrine, nutritional and metabolic diseases: Secondary | ICD-10-CM | POA: Diagnosis not present

## 2019-10-24 DIAGNOSIS — E039 Hypothyroidism, unspecified: Secondary | ICD-10-CM | POA: Diagnosis present

## 2019-10-24 DIAGNOSIS — Z85850 Personal history of malignant neoplasm of thyroid: Secondary | ICD-10-CM | POA: Diagnosis not present

## 2019-10-24 LAB — MAGNESIUM: Magnesium: 2.1 mg/dL (ref 1.7–2.4)

## 2019-10-24 LAB — BASIC METABOLIC PANEL
Anion gap: 9 (ref 5–15)
BUN: 12 mg/dL (ref 8–23)
CO2: 24 mmol/L (ref 22–32)
Calcium: 9.7 mg/dL (ref 8.9–10.3)
Chloride: 102 mmol/L (ref 98–111)
Creatinine, Ser: 1.06 mg/dL — ABNORMAL HIGH (ref 0.44–1.00)
GFR calc Af Amer: 57 mL/min — ABNORMAL LOW (ref >60)
GFR calc non Af Amer: 49 mL/min — ABNORMAL LOW (ref >60)
Glucose, Bld: 209 mg/dL — ABNORMAL HIGH (ref 70–99)
Potassium: 5.1 mmol/L (ref 3.5–5.1)
Sodium: 135 mmol/L (ref 135–145)

## 2019-10-24 MED ORDER — METFORMIN HCL 500 MG PO TABS: 1000.0000 mg | ORAL_TABLET | Freq: Every evening | ORAL | Status: DC

## 2019-10-24 MED ORDER — DOFETILIDE 500 MCG PO CAPS
500.0000 ug | ORAL_CAPSULE | Freq: Two times a day (BID) | ORAL | Status: DC
  Administered 2019-10-24 – 2019-10-27 (×6): 500 ug via ORAL
  Filled 2019-10-24 (×6): qty 1

## 2019-10-24 MED ORDER — SODIUM CHLORIDE 0.9 % IV SOLN: 250.0000 mL | INTRAVENOUS | Status: DC | PRN

## 2019-10-24 MED ORDER — LEVOTHYROXINE SODIUM 112 MCG PO TABS: 112.0000 ug | ORAL_TABLET | Freq: Every day | ORAL | Status: DC

## 2019-10-24 MED ORDER — LOSARTAN POTASSIUM 50 MG PO TABS: 100.0000 mg | ORAL_TABLET | Freq: Every day | ORAL | Status: DC

## 2019-10-24 MED ORDER — BISOPROLOL FUMARATE 5 MG PO TABS: 5.0000 mg | ORAL_TABLET | Freq: Every day | ORAL | Status: DC

## 2019-10-24 MED ORDER — DILTIAZEM HCL ER COATED BEADS 180 MG PO CP24: 180.0000 mg | ORAL_CAPSULE | Freq: Every day | ORAL | Status: DC

## 2019-10-24 MED ORDER — SODIUM CHLORIDE 0.9% FLUSH: 3.0000 mL | INTRAVENOUS | Status: DC | PRN

## 2019-10-24 MED ORDER — SIMVASTATIN 5 MG PO TABS
10.0000 mg | ORAL_TABLET | Freq: Every day | ORAL | Status: DC
  Filled 2019-10-24: qty 2

## 2019-10-24 MED ORDER — SODIUM CHLORIDE 0.9% FLUSH
3.0000 mL | Freq: Two times a day (BID) | INTRAVENOUS | Status: DC
  Administered 2019-10-25 – 2019-10-27 (×4): 3 mL via INTRAVENOUS

## 2019-10-24 MED ORDER — APIXABAN 5 MG PO TABS
5.0000 mg | ORAL_TABLET | Freq: Two times a day (BID) | ORAL | Status: DC
  Administered 2019-10-24 – 2019-10-27 (×6): 5 mg via ORAL
  Filled 2019-10-24 (×6): qty 1

## 2019-10-24 MED ORDER — UMECLIDINIUM-VILANTEROL 62.5-25 MCG/INH IN AEPB
1.0000 | INHALATION_SPRAY | RESPIRATORY_TRACT | Status: DC | PRN
  Filled 2019-10-24: qty 14

## 2019-10-24 MED ORDER — GLIPIZIDE 10 MG PO TABS: 10.0000 mg | ORAL_TABLET | Freq: Every day | ORAL | Status: DC

## 2019-10-24 NOTE — H&P (Addendum)
Patient being admitted for Tikosyn load. She was seen by Malka So, PA-C, in the [facility] today. Please see his note below which serves as H&P. Potassium 5.1 and Magnesium 2.1. QTc 442 today.  Darreld Mclean, PA-C 10/24/2019 7:32 PM     Primary Care Physician: Caryl Bis, MD Primary Cardiologist: Dr Harl Bowie Primary Electrophysiologist: none Referring Physician: Levell July NP   Miranda Wilkinson is a 84 y.o. female with a history of persistent atrial fibrillation, HTN, hypothyroidism, GERD, DM, and OSA who presents for follow up in the [facility].  The patient was initially diagnosed with atrial fibrillation 01/2019 on a preoperative ECG. Patient is on Eliquis for a CHADS2VASC score of 5. She appears to have been in persistent afib since then with overall rate control. Patient has had intermittent symptoms of generalized weakness and SOB especially with exertion. She was seen by Levell July 10/10/19 and was in afib with RVR. She was changed from metoprolol to bisoprolol and diltiazem. She denies bleeding issues with anticoagulation.   On follow up today, patient reports she is doing about the same since her last visit. She does become very winded with activity. She has stopped HCTZ and doxepin in anticipation of dofetilide admission. She denies any missed doses of anticoagulation in the last 3 weeks.   Today, she denies symptoms of palpitations, chest pain, orthopnea, PND, lower extremity edema, dizziness, presyncope, syncope, bleeding, or neurologic sequela. The patient is tolerating medications without difficulties and is otherwise without complaint today.    Atrial Fibrillation Risk Factors:  she does have symptoms or diagnosis of sleep apnea. she is compliant with CPAP therapy. she does not have a history of rheumatic fever.   she has a BMI of Body mass index is 39.37 kg/m.Marland Kitchen Filed Weights   10/24/19 1120  Weight:  107.3 kg         Family History  Problem Relation Age of Onset  . Stroke Mother        brain hemmorhage  . Diabetes Mother   . Heart disease Mother   . Thyroid disease Mother   . Arrhythmia Mother   . Stroke Father   . Heart attack Father        blood clot in brain  . Stroke Sister   . Cancer Sister   . Stomach cancer Brother   . Hypertension Brother   . Stroke Brother   . Hypertension Brother   . Arrhythmia Sister   . Hypertension Sister      Atrial Fibrillation Management history:  Previous antiarrhythmic drugs: none Previous cardioversions: none Previous ablations: none CHADS2VASC score: 5 Anticoagulation history: Eliquis       Past Medical History:  Diagnosis Date  . Anxiety   . Cancer ([place]) 1976   bilaterl thyroid ca  . Diabetes mellitus without complication ([place])   . GERD (gastroesophageal reflux disease)   . Headache(784.0)    hx migraines  . Hypertension   . Hypothyroidism   . Shortness of breath   . Sinus congestion   . Sleep apnea    study done in [place], [place], use CPAP        Past Surgical History:  Procedure Laterality Date  . APPENDECTOMY    . CHOLECYSTECTOMY    . OPEN REDUCTION INTERNAL FIXATION (ORIF) TIBIA/FIBULA FRACTURE Right 11/03/2013   Procedure: OPEN REDUCTION INTERNAL FIXATION (ORIF) RIGHT TIBIA/FIBULA PILON FRACTURE;  Surgeon: Wylene Simmer, MD;  Location: [facility];  Service: Orthopedics;  Laterality: Right;  . OVARIAN CYST SURGERY Left    age 11  . THYROID EXPLORATION    . TUBAL LIGATION            Current Outpatient Medications  Medication Sig Dispense Refill  . bisoprolol (ZEBETA) 5 MG tablet Take 1 tablet (5 mg total) by mouth daily. 30 tablet 6  . diltiazem (CARDIZEM CD) 180 MG 24 hr capsule Take 1 capsule (180 mg total) by mouth daily. 30 capsule 6  . ELIQUIS 5 MG TABS tablet TAKE 1 TABLET TWICE A DAY 180 tablet 3  . glipiZIDE (GLUCOTROL) 10 MG tablet Take 10 mg by mouth daily  before breakfast.    . levothyroxine (SYNTHROID) 112 MCG tablet Take 112 mcg by mouth daily before breakfast.    . losartan (COZAAR) 100 MG tablet Take 1 tablet (100 mg total) by mouth daily. 30 tablet 3  . metFORMIN (GLUCOPHAGE) 500 MG tablet Take 1,000 mg by mouth every evening.     . simvastatin (ZOCOR) 40 MG tablet Take 20 mg by mouth daily.     Marland Kitchen umeclidinium-vilanterol (ANORO ELLIPTA) 62.5-25 MCG/INH AEPB Only open the device one time and then take your two separate drags to be sure you get it all (Patient taking differently: Inhale 1 puff into the lungs as needed. Only open the device one time and then take your two separate drags to be sure you get it all) 1 each 11   No current facility-administered medications for this encounter.    No Known Allergies  Social History        Socioeconomic History  . Marital status: Single    Spouse name: Not on file  . Number of children: Not on file  . Years of education: Not on file  . Highest education level: Not on file  Occupational History  . Not on file  Tobacco Use  . Smoking status: Former Smoker    Packs/day: 1.50    Years: 60.00    Pack years: 90.00    Types: Cigarettes    Quit date: 11/03/2011    Years since quitting: 7.9  . Smokeless tobacco: Never Used  Vaping Use  . Vaping Use: Never used  Substance and Sexual Activity  . Alcohol use: No  . Drug use: No  . Sexual activity: Not on file  Other Topics Concern  . Not on file  Social History Narrative  . Not on file   Social Determinants of Health      Financial Resource Strain:   . Difficulty of Paying Living Expenses:   Food Insecurity:   . Worried About Charity fundraiser in the Last Year:   . Arboriculturist in the Last Year:   Transportation Needs:   . Film/video editor (Medical):   Marland Kitchen Lack of Transportation (Non-Medical):   Physical Activity:   . Days of Exercise per Week:   . Minutes of Exercise per Session:   Stress:    . Feeling of Stress :   Social Connections:   . Frequency of Communication with Friends and Family:   . Frequency of Social Gatherings with Friends and Family:   . Attends Religious Services:   . Active Member of Clubs or Organizations:   . Attends Archivist Meetings:   Marland Kitchen Marital Status:   Intimate Partner Violence:   . Fear of Current or Ex-Partner:   . Emotionally Abused:   Marland Kitchen Physically Abused:   . Sexually Abused:  ROS- All systems are reviewed and negative except as per the HPI above.  Physical Exam:    Vitals:   10/24/19 1120  BP: 140/66  Pulse: 87  SpO2: 94%  Weight: 107.3 kg  Height: 5\' 5"  (1.651 m)    GEN- The patient is well appearing obese elderly female, alert and oriented x 3 today.   HEENT-head normocephalic, atraumatic, sclera clear, conjunctiva pink, hearing intact, trachea midline. Lungs- Clear to ausculation bilaterally, normal work of breathing Heart- irregular rate and rhythm, no murmurs, rubs or gallops  GI- soft, NT, ND, + BS Extremities- no clubbing, cyanosis, or edema MS- no significant deformity or atrophy Skin- no rash or lesion Psych- euthymic mood, full affect Neuro- strength and sensation are intact      Wt Readings from Last 3 Encounters:  10/24/19 107.3 kg  10/14/19 105.3 kg  10/10/19 105.6 kg    EKG today demonstrates afib HR 87, QRS 70, QTc 442  Echo 02/24/19 demonstrated  1. Left ventricular ejection fraction, by visual estimation, is 70 to  75%. The left ventricle has hyperdynamic function. There is no left  ventricular hypertrophy.  2. Left ventricular diastolic parameters are indeterminate.  3. The left ventricle has no regional wall motion abnormalities.  4. Global right ventricle has normal systolic function.The right  ventricular size is normal. No increase in right ventricular wall  thickness.  5. Left atrial size was normal.  6. Right atrial size was normal.  7. The mitral valve is  normal in structure. Trivial mitral valve  regurgitation. No evidence of mitral stenosis.  8. The tricuspid valve is normal in structure. Tricuspid valve  regurgitation is not demonstrated.  9. The aortic valve has an indeterminant number of cusps. Aortic valve  regurgitation is not visualized. Mild aortic valve sclerosis without  stenosis.  10. The pulmonic valve was not well visualized. Pulmonic valve  regurgitation is not visualized.  11. Normal pulmonary artery systolic pressure.   Epic records are reviewed at length today  CHA2DS2-VASc Score = 5  The patient's score is based upon: CHF History: 0 HTN History: 1 Age : 4 Diabetes History: 1 Stroke History: 0 Vascular Disease History: 0 Gender: 1      ASSESSMENT AND PLAN: 1. Persistent Atrial Fibrillation (ICD10:  I48.19) The patient's CHA2DS2-VASc score is 5, indicating a 7.2% annual risk of stroke.   Patient wants to pursue dofetilide, aware of risk vrs benefit Aware of price of dofetilide No benadryl use PharmD has screened drugs. Doxepin and HCTZ discontinued.  QTc in SR 428 ms, Labs today show creatinine at 1.06, K+ 5.1 and mag 2.1, CrCl calculated at 69 mL/min Continue Eliquis 5 mg BID. Patient denies any missed doses in the last 3 weeks. Continue bisoprolol 5 mg daily Continue diltiazem 180 mg daily  2. Secondary Hypercoagulable State (ICD10:  D68.69) The patient is at significant risk for stroke/thromboembolism based upon her CHA2DS2-VASc Score of 5.  Continue Apixaban (Eliquis).   3. Obesity Body mass index is 39.37 kg/m. Lifestyle modification was discussed and encouraged including regular physical activity and weight reduction.  4. Obstructive sleep apnea Patient reports compliance with CPAP therapy.  5. HTN Stable, no changes today.   To be admitted later today once a bed becomes available.    [facility] [address] [place], [place]  [address] [phone] 10/24/2019 1:58 PM  Patient seen and examined with Sande Rives PA-C.  Agree as above, with the following exceptions  and changes as noted below. Patient admitted for Tikosyn loading. Gen: NAD, CV: iRRR, no murmurs, Lungs: clear, Abd: soft, Extrem: Warm Neuro/Psych: alert and oriented x 3, normal mood and affect, very hard of hearing. All available labs, radiology testing, previous records reviewed. Plan for Tikosyn loading as above. ECG reviewed. Patient asks for sleep aid but doxepin discontinued. Will monitor.   Elouise Munroe, MD 10/24/19 9:07 PM

## 2019-10-24 NOTE — Progress Notes (Signed)
Primary Care Physician: Caryl Bis, MD Primary Cardiologist: Dr Harl Bowie Primary Electrophysiologist: none Referring Physician: Levell July NP   Miranda Wilkinson [name] is a 84 y.o. female with a history of persistent atrial fibrillation, HTN, hypothyroidism, GERD, DM, and OSA who presents for follow up in the [facility].  The patient was initially diagnosed with atrial fibrillation 01/2019 on a preoperative ECG. Patient is on Eliquis for a CHADS2VASC score of 5. She appears to have been in persistent afib since then with overall rate control. Patient has had intermittent symptoms of generalized weakness and SOB especially with exertion. She was seen by Levell July 10/10/19 and was in afib with RVR. She was changed from metoprolol to bisoprolol and diltiazem. She denies bleeding issues with anticoagulation.   On follow up today, patient reports she is doing about the same since her last visit. She does become very winded with activity. She has stopped HCTZ and doxepin in anticipation of dofetilide admission. She denies any missed doses of anticoagulation in the last 3 weeks.   Today, she denies symptoms of palpitations, chest pain, orthopnea, PND, lower extremity edema, dizziness, presyncope, syncope, bleeding, or neurologic sequela. The patient is tolerating medications without difficulties and is otherwise without complaint today.    Atrial Fibrillation Risk Factors:  she does have symptoms or diagnosis of sleep apnea. she is compliant with CPAP therapy. she does not have a history of rheumatic fever.   she has a BMI of Body mass index is 39.37 kg/m.Marland Kitchen Filed Weights   10/24/19 1120  Weight: 107.3 kg    Family History  Problem Relation Age of Onset   Stroke Mother        brain hemmorhage   Diabetes Mother    Heart disease Mother    Thyroid disease Mother    Arrhythmia Mother    Stroke Father    Heart attack Father        blood clot in brain    Stroke Sister    Cancer Sister    Stomach cancer Brother    Hypertension Brother    Stroke Brother    Hypertension Brother    Arrhythmia Sister    Hypertension Sister      Atrial Fibrillation Management history:  Previous antiarrhythmic drugs: none Previous cardioversions: none Previous ablations: none CHADS2VASC score: 5 Anticoagulation history: Eliquis   Past Medical History:  Diagnosis Date   Anxiety    Cancer ([place]) 1976   bilaterl thyroid ca   Diabetes mellitus without complication ([place])    GERD (gastroesophageal reflux disease)    Headache(784.0)    hx migraines   Hypertension    Hypothyroidism    Shortness of breath    Sinus congestion    Sleep apnea    study done in [place], [place], use CPAP   Past Surgical History:  Procedure Laterality Date   APPENDECTOMY     CHOLECYSTECTOMY     OPEN REDUCTION INTERNAL FIXATION (ORIF) TIBIA/FIBULA FRACTURE Right 11/03/2013   Procedure: OPEN REDUCTION INTERNAL FIXATION (ORIF) RIGHT TIBIA/FIBULA PILON FRACTURE;  Surgeon: Wylene Simmer, MD;  Location: [facility];  Service: Orthopedics;  Laterality: Right;   OVARIAN CYST SURGERY Left    age 68   THYROID EXPLORATION     TUBAL LIGATION      Current Outpatient Medications  Medication Sig Dispense Refill   bisoprolol (ZEBETA) 5 MG tablet Take 1 tablet (5 mg total) by mouth daily. 30 tablet 6   diltiazem (CARDIZEM CD)  180 MG 24 hr capsule Take 1 capsule (180 mg total) by mouth daily. 30 capsule 6   ELIQUIS 5 MG TABS tablet TAKE 1 TABLET TWICE A DAY 180 tablet 3   glipiZIDE (GLUCOTROL) 10 MG tablet Take 10 mg by mouth daily before breakfast.     levothyroxine (SYNTHROID) 112 MCG tablet Take 112 mcg by mouth daily before breakfast.     losartan (COZAAR) 100 MG tablet Take 1 tablet (100 mg total) by mouth daily. 30 tablet 3   metFORMIN (GLUCOPHAGE) 500 MG tablet Take 1,000 mg by mouth every evening.      simvastatin (ZOCOR) 40 MG tablet Take 20 mg by mouth daily.       umeclidinium-vilanterol (ANORO ELLIPTA) 62.5-25 MCG/INH AEPB Only open the device one time and then take your two separate drags to be sure you get it all (Patient taking differently: Inhale 1 puff into the lungs as needed. Only open the device one time and then take your two separate drags to be sure you get it all) 1 each 11   No current facility-administered medications for this encounter.    No Known Allergies  Social History   Socioeconomic History   Marital status: Single    Spouse name: Not on file   Number of children: Not on file   Years of education: Not on file   Highest education level: Not on file  Occupational History   Not on file  Tobacco Use   Smoking status: Former Smoker    Packs/day: 1.50    Years: 60.00    Pack years: 90.00    Types: Cigarettes    Quit date: 11/03/2011    Years since quitting: 7.9   Smokeless tobacco: Never Used  Vaping Use   Vaping Use: Never used  Substance and Sexual Activity   Alcohol use: No   Drug use: No   Sexual activity: Not on file  Other Topics Concern   Not on file  Social History Narrative   Not on file   Social Determinants of Health   Financial Resource Strain:    Difficulty of Paying Living Expenses:   Food Insecurity:    Worried About Charity fundraiser in the Last Year:    Arboriculturist in the Last Year:   Transportation Needs:    Film/video editor (Medical):    Lack of Transportation (Non-Medical):   Physical Activity:    Days of Exercise per Week:    Minutes of Exercise per Session:   Stress:    Feeling of Stress :   Social Connections:    Frequency of Communication with Friends and Family:    Frequency of Social Gatherings with Friends and Family:    Attends Religious Services:    Active Member of Clubs or Organizations:    Attends Music therapist:    Marital Status:   Intimate Partner Violence:    Fear of Current or Ex-Partner:    Emotionally  Abused:    Physically Abused:    Sexually Abused:      ROS- All systems are reviewed and negative except as per the HPI above.  Physical Exam: Vitals:   10/24/19 1120  BP: 140/66  Pulse: 87  SpO2: 94%  Weight: 107.3 kg  Height: 5\' 5"  (1.651 m)    GEN- The patient is well appearing obese elderly female, alert and oriented x 3 today.   HEENT-head normocephalic, atraumatic, sclera clear, conjunctiva pink, hearing intact, trachea midline.  Lungs- Clear to ausculation bilaterally, normal work of breathing Heart- irregular rate and rhythm, no murmurs, rubs or gallops  GI- soft, NT, ND, + BS Extremities- no clubbing, cyanosis, or edema MS- no significant deformity or atrophy Skin- no rash or lesion Psych- euthymic mood, full affect Neuro- strength and sensation are intact   Wt Readings from Last 3 Encounters:  10/24/19 107.3 kg  10/14/19 105.3 kg  10/10/19 105.6 kg    EKG today demonstrates afib HR 87, QRS 70, QTc 442  Echo 02/24/19 demonstrated  1. Left ventricular ejection fraction, by visual estimation, is 70 to  75%. The left ventricle has hyperdynamic function. There is no left  ventricular hypertrophy.  2. Left ventricular diastolic parameters are indeterminate.  3. The left ventricle has no regional wall motion abnormalities.  4. Global right ventricle has normal systolic function.The right  ventricular size is normal. No increase in right ventricular wall  thickness.  5. Left atrial size was normal.  6. Right atrial size was normal.  7. The mitral valve is normal in structure. Trivial mitral valve  regurgitation. No evidence of mitral stenosis.  8. The tricuspid valve is normal in structure. Tricuspid valve  regurgitation is not demonstrated.  9. The aortic valve has an indeterminant number of cusps. Aortic valve  regurgitation is not visualized. Mild aortic valve sclerosis without  stenosis.  10. The pulmonic valve was not well visualized. Pulmonic  valve  regurgitation is not visualized.  11. Normal pulmonary artery systolic pressure.   Epic records are reviewed at length today  CHA2DS2-VASc Score = 5  The patient's score is based upon: CHF History: 0 HTN History: 1 Age : 2 Diabetes History: 1 Stroke History: 0 Vascular Disease History: 0 Gender: 1      ASSESSMENT AND PLAN: 1. Persistent Atrial Fibrillation (ICD10:  I48.19) The patient's CHA2DS2-VASc score is 5, indicating a 7.2% annual risk of stroke.   Patient wants to pursue dofetilide, aware of risk vrs benefit Aware of price of dofetilide No benadryl use PharmD has screened drugs. Doxepin and HCTZ discontinued.  QTc in SR 428 ms, Labs today show creatinine at 1.06, K+ 5.1 and mag 2.1, CrCl calculated at 69 mL/min Continue Eliquis 5 mg BID. Patient denies any missed doses in the last 3 weeks. Continue bisoprolol 5 mg daily Continue diltiazem 180 mg daily  2. Secondary Hypercoagulable State (ICD10:  D68.69) The patient is at significant risk for stroke/thromboembolism based upon her CHA2DS2-VASc Score of 5.  Continue Apixaban (Eliquis).   3. Obesity Body mass index is 39.37 kg/m. Lifestyle modification was discussed and encouraged including regular physical activity and weight reduction.  4. Obstructive sleep apnea Patient reports compliance with CPAP therapy.  5. HTN Stable, no changes today.   To be admitted later today once a bed becomes available.    [facility] [address] [place], [place] [address] [phone] 10/24/2019 1:58 PM

## 2019-10-24 NOTE — Progress Notes (Signed)
Pharmacy Review for Dofetilide (Tikosyn) Initiation  Admit Complaint: 84 y.o. female admitted 10/24/2019 with atrial fibrillation to be initiated on dofetilide.   Assessment:  Patient Exclusion Criteria: If any screening criteria checked as "Yes", then  patient  should NOT receive dofetilide until criteria item is corrected. If "Yes" please indicate correction plan.  YES  NO Patient  Exclusion Criteria Correction Plan  []  [x]  Baseline QTc interval is greater than or equal to 440 msec. IF above YES box checked dofetilide contraindicated unless patient has ICD; then may proceed if QTc 500-550 msec or with known ventricular conduction abnormalities may proceed with QTc 550-600 msec. QTc =  442   []  [x]  Magnesium level is less than 1.8 mEq/l : Last magnesium:  Lab Results  Component Value Date   MG 2.1 10/24/2019         []  [x]  Potassium level is less than 4 mEq/l : Last potassium:  Lab Results  Component Value Date   K 5.1 10/24/2019         []  [x]  Patient is known or suspected to have a digoxin level greater than 2 ng/ml: No results found for: DIGOXIN    []  [x]  Creatinine clearance less than 20 ml/min (calculated using Cockcroft-Gault, actual body weight and serum creatinine): Estimated Creatinine Clearance: 49.9 mL/min (A) (by C-G formula based on SCr of 1.06 mg/dL (H)).    []  [x]  Patient has received drugs known to prolong the QT intervals within the last 48 hours (phenothiazines, tricyclics or tetracyclic antidepressants, erythromycin, H-1 antihistamines, cisapride, fluoroquinolones, azithromycin). Drugs not listed above may have an, as yet, undetected potential to prolong the QT interval, updated information on QT prolonging agents is available at this website:QT prolonging agents Doxepin previously d/c'd  []  [x]  Patient received a dose of hydrochlorothiazide (Oretic) alone or in any combination including triamterene (Dyazide, Maxzide) in the last 48 hours.  HCTZ discontinued 8/6.   []  [x]  Patient received a medication known to increase dofetilide plasma concentrations prior to initial dofetilide dose:  . Trimethoprim (Primsol, Proloprim) in the last 36 hours . Verapamil (Calan, Verelan) in the last 36 hours or a sustained release dose in the last 72 hours . Megestrol (Megace) in the last 5 days  . Cimetidine (Tagamet) in the last 6 hours . Ketoconazole (Nizoral) in the last 24 hours . Itraconazole (Sporanox) in the last 48 hours  . Prochlorperazine (Compazine) in the last 36 hours    []  [x]  Patient is known to have a history of torsades de pointes; congenital or acquired long QT syndromes.   []  [x]  Patient has received a Class 1 antiarrhythmic with less than 2 half-lives since last dose. (Disopyramide, Quinidine, Procainamide, Lidocaine, Mexiletine, Flecainide, Propafenone)   []  [x]  Patient has received amiodarone therapy in the past 3 months or amiodarone level is greater than 0.3 ng/ml.    Patient has been appropriately anticoagulated with Eliquis.  Ordering provider was confirmed at [email or web address] if they are not listed on the [facility] Prescribers list.  Goal of Therapy: Follow renal function, electrolytes, potential drug interactions, and dose adjustment. Provide education and 1 week supply at discharge.  Plan:  [x]   Physician selected initial dose within range recommended for patients level of renal function - will monitor for response.  []   Physician selected initial dose outside of range recommended for patients level of renal function - will discuss if the dose should be altered at this time.   Select One Calculated CrCl  Dose q12h  [  x] > 60 ml/min 500 mcg  []  40-60 ml/min 250 mcg  []  20-40 ml/min 125 mcg   2. Follow up QTc after the first 5 doses, renal function, electrolytes (K & Mg) daily x 3     days, dose adjustment, success of initiation and facilitate 1 week discharge supply as     clinically indicated.  3. Initiate Tikosyn  education video (Call [phone] and ask for Tikosyn Video # 116).  4. Place Enrollment Form on the chart for discharge supply of dofetilide.  [email or web address]   Aragon Scarantino S. Alford Highland, PharmD, BCPS Clinical Staff Pharmacist [email or web address] Eilene Ghazi Stillinger 7:22 PM 10/24/2019

## 2019-10-25 ENCOUNTER — Encounter (HOSPITAL_COMMUNITY): Payer: Self-pay | Admitting: Internal Medicine

## 2019-10-25 ENCOUNTER — Ambulatory Visit: Payer: Medicare Other

## 2019-10-25 LAB — BASIC METABOLIC PANEL
Anion gap: 9 (ref 5–15)
BUN: 15 mg/dL (ref 8–23)
CO2: 22 mmol/L (ref 22–32)
Calcium: 9.4 mg/dL (ref 8.9–10.3)
Chloride: 104 mmol/L (ref 98–111)
Creatinine, Ser: 1.19 mg/dL — ABNORMAL HIGH (ref 0.44–1.00)
GFR calc Af Amer: 49 mL/min — ABNORMAL LOW (ref >60)
GFR calc non Af Amer: 42 mL/min — ABNORMAL LOW (ref >60)
Glucose, Bld: 125 mg/dL — ABNORMAL HIGH (ref 70–99)
Potassium: 4.1 mmol/L (ref 3.5–5.1)
Sodium: 135 mmol/L (ref 135–145)

## 2019-10-25 LAB — MAGNESIUM: Magnesium: 2 mg/dL (ref 1.7–2.4)

## 2019-10-25 MED ORDER — METFORMIN HCL 500 MG PO TABS
1000.0000 mg | ORAL_TABLET | Freq: Every day | ORAL | Status: DC
  Administered 2019-10-25 – 2019-10-26 (×2): 1000 mg via ORAL
  Filled 2019-10-25 (×2): qty 2

## 2019-10-25 MED ORDER — MAGNESIUM SULFATE 2 GM/50ML IV SOLN
2.0000 g | Freq: Once | INTRAVENOUS | Status: AC
  Administered 2019-10-25: 2 g via INTRAVENOUS
  Filled 2019-10-25: qty 50

## 2019-10-25 MED ORDER — GLIPIZIDE 10 MG PO TABS
10.0000 mg | ORAL_TABLET | Freq: Every day | ORAL | Status: DC
  Administered 2019-10-25 – 2019-10-27 (×3): 10 mg via ORAL
  Filled 2019-10-25 (×3): qty 1

## 2019-10-25 MED ORDER — LEVOTHYROXINE SODIUM 112 MCG PO TABS
112.0000 ug | ORAL_TABLET | Freq: Every day | ORAL | Status: DC
  Administered 2019-10-25 – 2019-10-27 (×3): 112 ug via ORAL
  Filled 2019-10-25 (×3): qty 1

## 2019-10-25 MED ORDER — LOSARTAN POTASSIUM 50 MG PO TABS
100.0000 mg | ORAL_TABLET | Freq: Every day | ORAL | Status: DC
  Administered 2019-10-25 – 2019-10-27 (×3): 100 mg via ORAL
  Filled 2019-10-25 (×3): qty 2

## 2019-10-25 MED ORDER — BISOPROLOL FUMARATE 5 MG PO TABS
5.0000 mg | ORAL_TABLET | Freq: Every day | ORAL | Status: DC
  Administered 2019-10-25 – 2019-10-26 (×2): 5 mg via ORAL
  Filled 2019-10-25 (×2): qty 1

## 2019-10-25 MED ORDER — SIMVASTATIN 20 MG PO TABS
10.0000 mg | ORAL_TABLET | Freq: Every day | ORAL | Status: DC
  Administered 2019-10-25 – 2019-10-26 (×2): 10 mg via ORAL
  Filled 2019-10-25 (×2): qty 1

## 2019-10-25 MED ORDER — DILTIAZEM HCL ER COATED BEADS 180 MG PO CP24
180.0000 mg | ORAL_CAPSULE | Freq: Every day | ORAL | Status: DC
  Administered 2019-10-25: 180 mg via ORAL
  Filled 2019-10-25: qty 1

## 2019-10-25 NOTE — Progress Notes (Signed)
Pharmacy: Dofetilide (Tikosyn) - Follow Up Assessment and Electrolyte Replacement  Pharmacy consulted to assist in monitoring and replacing electrolytes in this 84 y.o. female admitted on 10/24/2019 undergoing dofetilide initiation.   Labs:    Component Value Date/Time   K 4.1 10/25/2019 0344   MG 2.0 10/25/2019 0344     Plan: Potassium: K >/= 4: No additional supplementation needed  Magnesium: Mg 1.8-2: Give Mg 2 gm IV x1    Thank you for allowing pharmacy to participate in this patient's care   Hildred Laser, PharmD Clinical Pharmacist **Pharmacist phone directory can now be found on [email or web address] ([email or web address]).  Listed under [place].

## 2019-10-25 NOTE — TOC Benefit Eligibility Note (Signed)
Transition of Care [facility]) Benefit Eligibility Note    Patient Details  Name: Miranda Wilkinson MRN: [id number] Date of Birth: June 21, 1935   Medication/Dose: Phyllis Ginger not covered)  (Dofetilide 522mg bid  Covered?: Yes  Tier: 3 Drug  Prescription Coverage Preferred Pharmacy: [facility] Person/Company/Phone Number:: KKarin LieuW/[facility] PH# [phone] Co-Pay: $30.00  Prior Approval: No  Deductible: Met       HShelda AltesPhone Number: 10/25/2019, 11:13 AM

## 2019-10-25 NOTE — Progress Notes (Signed)
Morning EKG reviewed  Shows pt remains in afib/flutter at 77 bpm with stable QTc at ~480 ms.  Continue Tikosyn 500 mcg BID.   Pt will be NPO after midnight for DCCV if remains in [place], [place]  Pager: [phone]  10/25/2019 12:02 PM

## 2019-10-25 NOTE — Progress Notes (Signed)
Electrophysiology Rounding Note  Patient Name: Miranda Wilkinson Date of Encounter: 10/25/2019  Primary Cardiologist: Carlyle Dolly, MD  Electrophysiologist: None    Subjective   Pt remains in afib on Tikosyn 500 mcg BID   QTc from EKG last pm shows stable QTc at ~440  The patient is doing well today.  At this time, the patient denies chest pain, shortness of breath, or any new concerns.  Inpatient Medications    Scheduled Meds: . apixaban  5 mg Oral BID  . bisoprolol  5 mg Oral Daily  . diltiazem  180 mg Oral Daily  . dofetilide  500 mcg Oral BID  . glipiZIDE  10 mg Oral QAC breakfast  . levothyroxine  112 mcg Oral Q0600  . losartan  100 mg Oral Daily  . metFORMIN  1,000 mg Oral QPM  . simvastatin  10 mg Oral q1800  . sodium chloride flush  3 mL Intravenous Q12H   Continuous Infusions: . sodium chloride     PRN Meds: sodium chloride, sodium chloride flush, umeclidinium-vilanterol   Vital Signs    Vitals:   10/24/19 1822 10/25/19 0006 10/25/19 0326 10/25/19 0335  BP:  140/65 (!) 173/68 (!) 122/55  Pulse:   83   Resp: 18 20 20    Temp: 98.6 F (37 C) 98.4 F (36.9 C) 98.3 F (36.8 C)   TempSrc: Other (Comment) Oral Oral   SpO2: 92%  100%   Weight: 107.7 kg  107.1 kg     Intake/Output Summary (Last 24 hours) at 10/25/2019 0732 Last data filed at 10/24/2019 2000 Gross per 24 hour  Intake 280 ml  Output --  Net 280 ml   Filed Weights   10/24/19 1822 10/25/19 0326  Weight: 107.7 kg 107.1 kg    Physical Exam    GEN- The patient is well appearing, alert and oriented x 3 today.   Head- normocephalic, atraumatic Eyes-  Sclera clear, conjunctiva pink Ears- hearing intact Oropharynx- clear Neck- supple Lungs- Clear to ausculation bilaterally, normal work of breathing Heart- Regular rate and rhythm, no murmurs, rubs or gallops GI- soft, NT, ND, + BS Extremities- no clubbing, cyanosis, or edema Skin- no rash or lesion Psych- euthymic mood, full  affect Neuro- strength and sensation are intact  Labs    CBC No results for input(s): WBC, NEUTROABS, HGB, HCT, MCV, PLT in the last 72 hours. Basic Metabolic Panel Recent Labs    10/24/19 1315 10/25/19 0344  NA 135 135  K 5.1 4.1  CL 102 104  CO2 24 22  GLUCOSE 209* 125*  BUN 12 15  CREATININE 1.06* 1.19*  CALCIUM 9.7 9.4  MG 2.1 2.0    Potassium  Date/Time Value Ref Range Status  10/25/2019 03:44 AM 4.1 3.5 - 5.1 mmol/L Final   Magnesium  Date/Time Value Ref Range Status  10/25/2019 03:44 AM 2.0 1.7 - 2.4 mg/dL Final    Comment:    Performed at [facility], [place] [address], [place], [place] [address]    Telemetry    AF 80-90s (personally reviewed)  Radiology    No results found.   Patient Profile     Miranda Wilkinson is a 84 y.o. female with a past medical history significant for persistent atrial fibrillation.  They were admitted for tikosyn load.   Assessment & Plan    1. Persistent atrial fibrillation Pt remains in afib on Tikosyn 500 mcg BID  Continue Eliquis Electrolytes stable.  CHA2DS2VASC is at  least 5  2. OSA Continue CPAP  3. HTN Stable. Follow  If pt does not convert chemically, plan on DCCV tomorrow   For questions or updates, please contact [place] Please consult [email or web address] for contact info under Cardiology/STEMI.  Signed, Shirley Friar, PA-C  10/25/2019, 7:32 AM

## 2019-10-26 ENCOUNTER — Encounter (HOSPITAL_COMMUNITY)
Admission: RE | Disposition: A | Discharge status: Home / Self Care | Payer: Self-pay | Source: Ambulatory Visit | Attending: Cardiology

## 2019-10-26 LAB — BASIC METABOLIC PANEL
Anion gap: 7 (ref 5–15)
BUN: 20 mg/dL (ref 8–23)
CO2: 25 mmol/L (ref 22–32)
Calcium: 9.3 mg/dL (ref 8.9–10.3)
Chloride: 103 mmol/L (ref 98–111)
Creatinine, Ser: 1.13 mg/dL — ABNORMAL HIGH (ref 0.44–1.00)
GFR calc Af Amer: 52 mL/min — ABNORMAL LOW (ref >60)
GFR calc non Af Amer: 45 mL/min — ABNORMAL LOW (ref >60)
Glucose, Bld: 123 mg/dL — ABNORMAL HIGH (ref 70–99)
Potassium: 4.4 mmol/L (ref 3.5–5.1)
Sodium: 135 mmol/L (ref 135–145)

## 2019-10-26 LAB — PROTIME-INR
INR: 1.5 — ABNORMAL HIGH (ref 0.8–1.2)
Prothrombin Time: 17.2 seconds — ABNORMAL HIGH (ref 11.4–15.2)

## 2019-10-26 LAB — MAGNESIUM: Magnesium: 2.3 mg/dL (ref 1.7–2.4)

## 2019-10-26 SURGERY — CARDIOVERSION
Anesthesia: General

## 2019-10-26 MED ORDER — BISOPROLOL FUMARATE 5 MG PO TABS
2.5000 mg | ORAL_TABLET | Freq: Once | ORAL | Status: AC
  Administered 2019-10-26: 2.5 mg via ORAL
  Filled 2019-10-26: qty 1

## 2019-10-26 MED ORDER — BISOPROLOL FUMARATE 5 MG PO TABS
10.0000 mg | ORAL_TABLET | Freq: Every day | ORAL | Status: DC
  Administered 2019-10-27: 10 mg via ORAL
  Filled 2019-10-26: qty 2

## 2019-10-26 NOTE — Progress Notes (Signed)
Pharmacy: Dofetilide (Tikosyn) - Follow Up Assessment and Electrolyte Replacement  Pharmacy consulted to assist in monitoring and replacing electrolytes in this 84 y.o. female admitted on 10/24/2019 undergoing dofetilide initiation.   Labs:    Component Value Date/Time   K 4.4 10/26/2019 0830   MG 2.3 10/26/2019 0830     Plan: Potassium: K >/= 4: No additional supplementation needed  Magnesium: Mg > 2: No additional supplementation needed   Thank you for allowing pharmacy to participate in this patient's care   Antonietta Jewel, PharmD, [place] Pharmacist  Phone: [phone] 10/26/2019 9:44 AM  Please check AMION for all [place] phone numbers After 10:00 PM, call [place] [phone]

## 2019-10-26 NOTE — H&P (View-Only) (Signed)
Electrophysiology Rounding Note  Patient Name: Miranda Wilkinson Date of Encounter: 10/26/2019  Primary Cardiologist: Carlyle Dolly, MD  Electrophysiologist: New to Dr. Quentin Ore   Subjective   Pt is having intermittent AF and NSR on Tikosyn 500 mcg BID   QTc from EKG last pm shows borderline QTc at ~490-495  The patient is doing well today.  At this time, the patient denies chest pain, shortness of breath, or any new concerns.  Inpatient Medications    Scheduled Meds: . apixaban  5 mg Oral BID  . bisoprolol  5 mg Oral Daily  . dofetilide  500 mcg Oral BID  . glipiZIDE  10 mg Oral QAC breakfast  . levothyroxine  112 mcg Oral Q0600  . losartan  100 mg Oral Daily  . metFORMIN  1,000 mg Oral Q supper  . simvastatin  10 mg Oral q1800  . sodium chloride flush  3 mL Intravenous Q12H   Continuous Infusions: . sodium chloride     PRN Meds: sodium chloride, sodium chloride flush, umeclidinium-vilanterol   Vital Signs    Vitals:   10/25/19 2155 10/25/19 2156 10/26/19 0323 10/26/19 0326  BP:  (!) 130/57  (!) 154/58  Pulse: 75   82  Resp: 20   18  Temp: 98.3 F (36.8 C)   98.3 F (36.8 C)  TempSrc: Oral   Oral  SpO2: 100%   98%  Weight:   106.7 kg   Height:        Intake/Output Summary (Last 24 hours) at 10/26/2019 0746 Last data filed at 10/26/2019 0550 Gross per 24 hour  Intake 1010.14 ml  Output 2700 ml  Net -1689.86 ml   Filed Weights   10/24/19 1822 10/25/19 0326 10/26/19 0323  Weight: 107.7 kg 107.1 kg 106.7 kg    Physical Exam    GEN- The patient is well appearing, alert and oriented x 3 today.   Head- normocephalic, atraumatic Eyes-  Sclera clear, conjunctiva pink Ears- hearing intact Oropharynx- clear Neck- supple Lungs- Clear to ausculation bilaterally, normal work of breathing Heart- Regular rate and rhythm, no murmurs, rubs or gallops GI- soft, NT, ND, + BS Extremities- no clubbing, cyanosis, or edema Skin- no rash or lesion Psych- euthymic  mood, full affect Neuro- strength and sensation are intact  Labs    CBC No results for input(s): WBC, NEUTROABS, HGB, HCT, MCV, PLT in the last 72 hours. Basic Metabolic Panel Recent Labs    10/24/19 1315 10/25/19 0344  NA 135 135  K 5.1 4.1  CL 102 104  CO2 24 22  GLUCOSE 209* 125*  BUN 12 15  CREATININE 1.06* 1.19*  CALCIUM 9.7 9.4  MG 2.1 2.0    Potassium  Date/Time Value Ref Range Status  10/25/2019 03:44 AM 4.1 3.5 - 5.1 mmol/L Final   Magnesium  Date/Time Value Ref Range Status  10/25/2019 03:44 AM 2.0 1.7 - 2.4 mg/dL Final    Comment:    Performed at [facility], [place] [address], [place], [place] [address]    Telemetry    NSR with intermittent periods of AF, rates controlled (personally reviewed)  Radiology    No results found.   Patient Profile     Miranda Wilkinson is a 84 y.o. female with a past medical history significant for persistent atrial fibrillation.  They were admitted for tikosyn load.   Assessment & Plan    1. Persistent atrial fibrillation Pt is having intermittent periods of NSR/AF on  Tikosyn 500 mcg BID  Continue Eliquis Electrolytes pending this am.  CHA2DS2VASC is at least 5. Stop diltiazem. Can increase bisoprolol as needed.   2. OSA Continue CPAP  3. HTN Stable. Follow.   As she has been going in and out of sinus, will not plan DCCV.   For questions or updates, please contact [place] Please consult [email or web address] for contact info under Cardiology/STEMI.  Signed, Shirley Friar, PA-C  10/26/2019, 7:46 AM

## 2019-10-26 NOTE — Progress Notes (Addendum)
Morning EKG reviewed  Shows currently back in AF (? AFL) at 78 bpm with stable QTc at ~480 ms.  Continue Tikosyn 500 mcg BID.   Discussed with Dr. Quentin Ore. Will put on for DCCV first thing tomorrow morning as so far today she has not had any further periods of NSR.   Will also increase bisoprolol.   Shirley Friar, [place]  Pager: [phone]  10/26/2019 12:53 PM

## 2019-10-26 NOTE — Progress Notes (Signed)
Electrophysiology Rounding Note  Patient Name: Miranda Wilkinson Date of Encounter: 10/26/2019  Primary Cardiologist: Carlyle Dolly, MD  Electrophysiologist: New to Dr. Quentin Ore   Subjective   Pt is having intermittent AF and NSR on Tikosyn 500 mcg BID   QTc from EKG last pm shows borderline QTc at ~490-495  The patient is doing well today.  At this time, the patient denies chest pain, shortness of breath, or any new concerns.  Inpatient Medications    Scheduled Meds: . apixaban  5 mg Oral BID  . bisoprolol  5 mg Oral Daily  . dofetilide  500 mcg Oral BID  . glipiZIDE  10 mg Oral QAC breakfast  . levothyroxine  112 mcg Oral Q0600  . losartan  100 mg Oral Daily  . metFORMIN  1,000 mg Oral Q supper  . simvastatin  10 mg Oral q1800  . sodium chloride flush  3 mL Intravenous Q12H   Continuous Infusions: . sodium chloride     PRN Meds: sodium chloride, sodium chloride flush, umeclidinium-vilanterol   Vital Signs    Vitals:   10/25/19 2155 10/25/19 2156 10/26/19 0323 10/26/19 0326  BP:  (!) 130/57  (!) 154/58  Pulse: 75   82  Resp: 20   18  Temp: 98.3 F (36.8 C)   98.3 F (36.8 C)  TempSrc: Oral   Oral  SpO2: 100%   98%  Weight:   106.7 kg   Height:        Intake/Output Summary (Last 24 hours) at 10/26/2019 0746 Last data filed at 10/26/2019 0550 Gross per 24 hour  Intake 1010.14 ml  Output 2700 ml  Net -1689.86 ml   Filed Weights   10/24/19 1822 10/25/19 0326 10/26/19 0323  Weight: 107.7 kg 107.1 kg 106.7 kg    Physical Exam    GEN- The patient is well appearing, alert and oriented x 3 today.   Head- normocephalic, atraumatic Eyes-  Sclera clear, conjunctiva pink Ears- hearing intact Oropharynx- clear Neck- supple Lungs- Clear to ausculation bilaterally, normal work of breathing Heart- Regular rate and rhythm, no murmurs, rubs or gallops GI- soft, NT, ND, + BS Extremities- no clubbing, cyanosis, or edema Skin- no rash or lesion Psych- euthymic  mood, full affect Neuro- strength and sensation are intact  Labs    CBC No results for input(s): WBC, NEUTROABS, HGB, HCT, MCV, PLT in the last 72 hours. Basic Metabolic Panel Recent Labs    10/24/19 1315 10/25/19 0344  NA 135 135  K 5.1 4.1  CL 102 104  CO2 24 22  GLUCOSE 209* 125*  BUN 12 15  CREATININE 1.06* 1.19*  CALCIUM 9.7 9.4  MG 2.1 2.0    Potassium  Date/Time Value Ref Range Status  10/25/2019 03:44 AM 4.1 3.5 - 5.1 mmol/L Final   Magnesium  Date/Time Value Ref Range Status  10/25/2019 03:44 AM 2.0 1.7 - 2.4 mg/dL Final    Comment:    Performed at [facility], [place] [address] [place], [place] [address]    Telemetry    NSR with intermittent periods of AF, rates controlled (personally reviewed)  Radiology    No results found.   Patient Profile     Miranda Wilkinson is a 84 y.o. female with a past medical history significant for persistent atrial fibrillation.  They were admitted for tikosyn load.   Assessment & Plan    1. Persistent atrial fibrillation Pt is having intermittent periods of NSR/AF on  Tikosyn 500 mcg BID  Continue Eliquis Electrolytes pending this am.  CHA2DS2VASC is at least 5. Stop diltiazem. Can increase bisoprolol as needed.   2. OSA Continue CPAP  3. HTN Stable. Follow.   As she has been going in and out of sinus, will not plan DCCV.   For questions or updates, please contact [place] Please consult [email or web address] for contact info under Cardiology/STEMI.  Signed, Shirley Friar, PA-C  10/26/2019, 7:46 AM

## 2019-10-27 ENCOUNTER — Encounter (HOSPITAL_COMMUNITY): Payer: Self-pay | Admitting: Internal Medicine

## 2019-10-27 ENCOUNTER — Inpatient Hospital Stay (HOSPITAL_COMMUNITY): Payer: Medicare Other | Admitting: Certified Registered Nurse Anesthetist

## 2019-10-27 ENCOUNTER — Encounter (HOSPITAL_COMMUNITY)
Admission: RE | Disposition: A | Discharge status: Home / Self Care | Payer: Self-pay | Source: Ambulatory Visit | Attending: Cardiology

## 2019-10-27 HISTORY — PX: CARDIOVERSION: SHX1299

## 2019-10-27 LAB — BASIC METABOLIC PANEL
Anion gap: 9 (ref 5–15)
BUN: 19 mg/dL (ref 8–23)
CO2: 25 mmol/L (ref 22–32)
Calcium: 9.4 mg/dL (ref 8.9–10.3)
Chloride: 102 mmol/L (ref 98–111)
Creatinine, Ser: 1.17 mg/dL — ABNORMAL HIGH (ref 0.44–1.00)
GFR calc Af Amer: 50 mL/min — ABNORMAL LOW (ref >60)
GFR calc non Af Amer: 43 mL/min — ABNORMAL LOW (ref >60)
Glucose, Bld: 113 mg/dL — ABNORMAL HIGH (ref 70–99)
Potassium: 4.3 mmol/L (ref 3.5–5.1)
Sodium: 136 mmol/L (ref 135–145)

## 2019-10-27 LAB — MAGNESIUM: Magnesium: 2.2 mg/dL (ref 1.7–2.4)

## 2019-10-27 LAB — GLUCOSE, CAPILLARY: Glucose-Capillary: 121 mg/dL — ABNORMAL HIGH (ref 70–99)

## 2019-10-27 SURGERY — CARDIOVERSION
Anesthesia: General

## 2019-10-27 MED ORDER — DOFETILIDE 500 MCG PO CAPS: 500.0000 ug | ORAL_CAPSULE | Freq: Two times a day (BID) | ORAL | 2 refills | Status: DC

## 2019-10-27 MED ORDER — BISOPROLOL FUMARATE 10 MG PO TABS: 10.0000 mg | ORAL_TABLET | Freq: Every day | ORAL | 6 refills | Status: DC

## 2019-10-27 MED ORDER — PROPOFOL 10 MG/ML IV BOLUS
INTRAVENOUS | Status: DC | PRN
  Administered 2019-10-27: 50 mg via INTRAVENOUS

## 2019-10-27 MED ORDER — LIDOCAINE 2% (20 MG/ML) 5 ML SYRINGE
INTRAMUSCULAR | Status: DC | PRN
  Administered 2019-10-27: 40 mg via INTRAVENOUS

## 2019-10-27 MED FILL — DOFETILIDE 500 MCG CAPS: 500 | 30 days supply | Qty: 60 | Fill #0 | Status: TO

## 2019-10-27 MED FILL — BISOPROLOL FUMARATE 10 MG T: 10 | 30 days supply | Qty: 30 | Fill #0 | Status: TO

## 2019-10-27 NOTE — Progress Notes (Signed)
Evening EKG reviewed  Shows pt remains in afib at 92 bpm with borderline QTc at ~490-495 ms.  Continue Tikosyn 500 mcg BID for now.   Pt initially had periods of sinus, but then went back into AF/AFL without further sinus.   Plan for DCCV this am, then potentially home this afternoon if QTc remains stable in sinus.   Full note pending disposition. (Progress vs Discharge)  Annamaria Helling  Pager: [phone]  10/27/2019 7:12 AM

## 2019-10-27 NOTE — Transfer of Care (Signed)
Immediate Anesthesia Transfer of Care Note  Patient: [place] [name]  Procedure(s) Performed: CARDIOVERSION (N/A )  Patient Location: PACU and Endoscopy Unit  Anesthesia Type:General  Level of Consciousness: awake, patient cooperative and responds to stimulation  Airway & Oxygen Therapy: Patient Spontanous Breathing  Post-op Assessment: Report given to RN and Post -op Vital signs reviewed and stable  Post vital signs: Reviewed and stable  Last Vitals:  Vitals Value Taken Time  BP    Temp    Pulse    Resp    SpO2      Last Pain:  Vitals:   10/27/19 0746  TempSrc: Oral  PainSc: 0-No pain      Patients Stated Pain Goal: 0 (32/12/24 8250)  Complications: No complications documented.

## 2019-10-27 NOTE — Progress Notes (Signed)
Pharmacy: Dofetilide (Tikosyn) - Follow Up Assessment and Electrolyte Replacement  Pharmacy consulted to assist in monitoring and replacing electrolytes in this 84 y.o. female admitted on 10/24/2019 undergoing dofetilide initiation.   Labs:    Component Value Date/Time   K 4.3 10/27/2019 0415   MG 2.2 10/27/2019 0415     Plan: Potassium: K >/= 4: No additional supplementation needed  Magnesium: Mg > 2: No additional supplementation needed   Thank you for allowing pharmacy to participate in this patient's care   Arrie Senate, PharmD, BCPS Clinical Pharmacist [phone] Please check AMION for all [place] numbers 10/27/2019

## 2019-10-27 NOTE — Anesthesia Preprocedure Evaluation (Addendum)
Anesthesia Evaluation  Patient identified by MRN, date of birth, ID band Patient awake    Reviewed: Allergy & Precautions, H&P , NPO status , Patient's Chart, lab work & pertinent test results  Airway Mallampati: II  TM Distance: >3 FB Neck ROM: Full    Dental no notable dental hx. (+) Teeth Intact, Dental Advisory Given   Pulmonary sleep apnea , COPD, former smoker,    Pulmonary exam normal breath sounds clear to auscultation       Cardiovascular hypertension, Pt. on medications and Pt. on home beta blockers + dysrhythmias Atrial Fibrillation  Rhythm:Irregular Rate:Tachycardia     Neuro/Psych  Headaches, Anxiety Depression    GI/Hepatic negative GI ROS, Neg liver ROS,   Endo/Other  diabetes, Type 2, Oral Hypoglycemic AgentsHypothyroidism Morbid obesity  Renal/GU negative Renal ROS  negative genitourinary   Musculoskeletal   Abdominal   Peds  Hematology negative hematology ROS (+)   Anesthesia Other Findings   Reproductive/Obstetrics negative OB ROS                            Anesthesia Physical Anesthesia Plan  ASA: III  Anesthesia Plan: General   Post-op Pain Management:    Induction: Intravenous  PONV Risk Score and Plan: 3 and Propofol infusion and Treatment may vary due to age or medical condition  Airway Management Planned: Mask  Additional Equipment:   Intra-op Plan:   Post-operative Plan:   Informed Consent: I have reviewed the patients History and Physical, chart, labs and discussed the procedure including the risks, benefits and alternatives for the proposed anesthesia with the patient or authorized representative who has indicated his/her understanding and acceptance.     Dental advisory given  Plan Discussed with: CRNA  Anesthesia Plan Comments:         Anesthesia Quick Evaluation

## 2019-10-27 NOTE — Discharge Instructions (Signed)
Dofetilide capsules What is this medicine? DOFETILIDE (doe FET il ide) is an antiarrhythmic drug. It helps make your heart beat regularly. This medicine also helps to slow rapid heartbeats. This medicine may be used for other purposes; ask your health care provider or pharmacist if you have questions. COMMON BRAND NAME(S): Tikosyn What should I tell my health care provider before I take this medicine? They need to know if you have any of these conditions:  heart disease  history of irregular heartbeat  history of low levels of potassium or magnesium in the blood  kidney disease  liver disease  an unusual or allergic reaction to dofetilide, other medicines, foods, dyes, or preservatives  pregnant or trying to get pregnant  breast-feeding How should I use this medicine? Take this medicine by mouth with a glass of water. Follow the directions on the prescription label. Do not take with grapefruit juice. You can take it with or without food. If it upsets your stomach, take it with food. Take your medicine at regular intervals. Do not take it more often than directed. Do not stop taking except on your doctor's advice. A special MedGuide will be given to you by the pharmacist with each prescription and refill. Be sure to read this information carefully each time. Talk to your pediatrician regarding the use of this medicine in children. Special care may be needed. Overdosage: If you think you have taken too much of this medicine contact a poison control center or emergency room at once. NOTE: This medicine is only for you. Do not share this medicine with others. What if I miss a dose? If you miss a dose, skip it. Take your next dose at the normal time. Do not take extra or 2 doses at the same time to make up for the missed dose. What may interact with this medicine? Do not take this medicine with any of the following  medications:  cimetidine  cisapride  dolutegravir  dronedarone  hydrochlorothiazide  ketoconazole  megestrol  pimozide  prochlorperazine  thioridazine  trimethoprim  verapamil This medicine may also interact with the following medications:  amiloride  cannabinoids  certain antibiotics like erythromycin or clarithromycin  certain antiviral medicines for HIV or hepatitis  certain medicines for depression, anxiety, or psychotic disorders  digoxin  diltiazem  grapefruit juice  metformin  nefazodone  other medicines that prolong the QT interval (an abnormal heart rhythm)  quinine  triamterene  zafirlukast  ziprasidone This list may not describe all possible interactions. Give your health care provider a list of all the medicines, herbs, non-prescription drugs, or dietary supplements you use. Also tell them if you smoke, drink alcohol, or use illegal drugs. Some items may interact with your medicine. What should I watch for while using this medicine? Your condition will be monitored carefully while you are receiving this medicine. What side effects may I notice from receiving this medicine? Side effects that you should report to your doctor or health care professional as soon as possible:  allergic reactions like skin rash, itching or hives, swelling of the face, lips, or tongue  breathing problems  chest pain or chest tightness  dizziness  signs and symptoms of a dangerous change in heartbeat or heart rhythm like chest pain; dizziness; fast or irregular heartbeat; palpitations; feeling faint or lightheaded, falls; breathing problems  signs and symptoms of electrolyte imbalance like severe diarrhea, unusual sweating, vomiting, loss of appetite, increased thirst  swelling of the ankles, legs, or feet  tingling,  numbness in the hands or feet Side effects that usually do not require medical attention (report to your doctor or health care  professional if they continue or are bothersome):  diarrhea  general ill feeling or flu-like symptoms  headache  nausea  trouble sleeping  stomach pain This list may not describe all possible side effects. Call your doctor for medical advice about side effects. You may report side effects to FDA at 1-800-FDA-1088. Where should I keep my medicine? Keep out of the reach of children. Store at room temperature between 15 and 30 degrees C (59 and 86 degrees F). Throw away any unused medicine after the expiration date. NOTE: This sheet is a summary. It may not cover all possible information. If you have questions about this medicine, talk to your doctor, pharmacist, or health care provider.  2020 Elsevier/Gold Standard (2018-02-15 10:18:48)  

## 2019-10-27 NOTE — Anesthesia Postprocedure Evaluation (Signed)
Anesthesia Post Note  Patient: [place] [name]  Procedure(s) Performed: CARDIOVERSION (N/A )     Patient location during evaluation: Endoscopy Anesthesia Type: General Level of consciousness: awake and alert Pain management: pain level controlled Vital Signs Assessment: post-procedure vital signs reviewed and stable Respiratory status: spontaneous breathing, nonlabored ventilation and respiratory function stable Cardiovascular status: blood pressure returned to baseline and stable Postop Assessment: no apparent nausea or vomiting Anesthetic complications: no   No complications documented.  Last Vitals:  Vitals:   10/27/19 0853 10/27/19 0855  BP:  132/77  Pulse: 76 91  Resp: (!) 25 (!) 24  Temp:    SpO2: 93% 94%    Last Pain:  Vitals:   10/27/19 0855  TempSrc:   PainSc: 0-No pain                 Caniya Tagle,W. EDMOND

## 2019-10-27 NOTE — Interval H&P Note (Signed)
History and Physical Interval Note:  10/27/2019 8:28 AM  Miranda Wilkinson  has presented today for surgery, with the diagnosis of afib.  The various methods of treatment have been discussed with the patient and family. After consideration of risks, benefits and other options for treatment, the patient has consented to  Procedure(s): CARDIOVERSION (N/A) as a surgical intervention.  The patient's history has been reviewed, patient examined, no change in status, stable for surgery.  I have reviewed the patient's chart and labs.  Questions were answered to the patient's satisfaction.     [name]

## 2019-10-27 NOTE — CV Procedure (Signed)
Procedure:   DCCV  Indication:  Symptomatic atrial fibrillation  Procedure Note:  The patient signed informed consent.  They have had had therapeutic anticoagulation with Eliquis greater than 3 weeks.  Anesthesia was administered by Dr. Ola Spurr.  Adequate airway was maintained throughout and vital followed per protocol.  They were cardioverted x 3 with 200J of biphasic synchronized energy.  Cardioversion was not successful.  With each cardioversion she converted ton sinus rhythm for approximately 30 seconds, then would have a run of PACs and convert back to atrial fibrillation.  There were no apparent complications.  The patient had normal neuro status and respiratory status post procedure with vitals stable as recorded elsewhere.    Follow up:  They will continue on current medical therapy and follow up with cardiology as scheduled.  Oswaldo Milian, MD 10/27/2019 8:50 AM

## 2019-10-27 NOTE — Discharge Summary (Signed)
ELECTROPHYSIOLOGY PROCEDURE DISCHARGE SUMMARY    Patient ID: Miranda Wilkinson,  MRN: [id number], DOB/AGE: 06-09-1935 84 y.o.  Admit date: 10/24/2019 Discharge date: 10/27/2019  Primary Care Physician: Caryl Bis, MD  Primary Cardiologist: Carlyle Dolly, MD  Electrophysiologist: None   Primary Discharge Diagnosis:  1.  Persistent atrial fibrillation status post Tikosyn loading this admission  Secondary Discharge Diagnosis:  2. OSA 3. Morbid Obesity 4. Mild COPD  No Known Allergies   Procedures This Admission:  1.  Tikosyn loading 2.  Direct current cardioversion on 10/27/19 by Dr Gardiner Rhyme which was unsuccessful. There were no early apparent complications.   Brief HPI: Miranda Wilkinson is a 84 y.o. female with a past medical history as noted above.  They were referred to EP in the outpatient setting for treatment options of atrial fibrillation.  Risks, benefits, and alternatives to Tikosyn were reviewed with the patient who wished to proceed.    Hospital Course:  The patient was admitted and Tikosyn was initiated.  Renal function and electrolytes were followed during the hospitalization.  Their QTc remained stable/borderline in the 490s.  Pt initially converted chemically for a short period, but then went back into AF/AFL with no further periods of sinus. On 10/27/19 they underwent direct current cardioversion which was unsuccessful.   Discussed with patient. Will leave on tikosyn for now with close follow up in AF clinic and one additional DCCV if remains in AF/AFL.   If fails, would abandon Tikosyn. Though she has mild COPD, her PFTs were fairly stable and would likely tolerate amiodarone.  If she fails amiodarone or does not tolerate it, could consider PVI vs AV node ablation and pacing.   They were monitored until discharge on telemetry which demonstrated a short period of NSR after initially starting, but then atrial fibrillation with periods of what appeared to be  atrial flutter as well.  On the day of discharge, they were examined by Dr. Quentin Ore  who considered them stable for discharge to home.  Follow-up has been arranged with the Atrial Fibrillation clinic in approximately 1 week and with Quentin Ore in about 4 weeks after that pending course.   Physical Exam: Vitals:   10/27/19 0850 10/27/19 0851 10/27/19 0853 10/27/19 0855  BP: (!) 121/27 122/65  132/77  Pulse: 89 77 76 91  Resp: 20 (!) 23 (!) 25 (!) 24  Temp:      TempSrc:      SpO2: 95% 93% 93% 94%  Weight:      Height:        GEN- The patient is well appearing, alert and oriented x 3 today.   HEENT: normocephalic, atraumatic; sclera clear, conjunctiva pink; hearing intact; oropharynx clear; neck supple, no JVP Lymph- no cervical lymphadenopathy Lungs- Clear to ausculation bilaterally, normal work of breathing.  No wheezes, rales, rhonchi Heart- Irregularly irregular rate and rhythm, no murmurs, rubs or gallops, PMI not laterally displaced GI- soft, non-tender, non-distended, bowel sounds present, no hepatosplenomegaly Extremities- no clubbing, cyanosis, or edema; DP/PT/radial pulses 2+ bilaterally MS- no significant deformity or atrophy Skin- warm and dry, no rash or lesion Psych- euthymic mood, full affect Neuro- strength and sensation are intact   Labs:   Lab Results  Component Value Date   WBC 9.5 11/02/2013   HGB 13.6 11/02/2013   HCT 40.1 11/02/2013   MCV 91.8 11/02/2013   PLT 375 11/02/2013    Recent Labs  Lab 10/27/19 0415  NA 136  K 4.3  CL 102  CO2 25  BUN 19  CREATININE 1.17*  CALCIUM 9.4  GLUCOSE 113*     Discharge Medications:  Allergies as of 10/27/2019   No Known Allergies     Medication List    STOP taking these medications   diltiazem 180 MG 24 hr capsule Commonly known as: CARDIZEM CD     TAKE these medications   Anoro Ellipta 62.5-25 MCG/INH Aepb Generic drug: umeclidinium-vilanterol Only open the device one time and then take your two  separate drags to be sure you get it all What changed:   how much to take  how to take this  when to take this  reasons to take this   bisoprolol 10 MG tablet Commonly known as: ZEBETA Take 1 tablet (10 mg total) by mouth daily. Start taking on: October 28, 2019 What changed:   medication strength  how much to take   dofetilide 500 MCG capsule Commonly known as: TIKOSYN Take 1 capsule (500 mcg total) by mouth 2 (two) times daily.   Eliquis 5 MG Tabs tablet Generic drug: apixaban TAKE 1 TABLET TWICE A DAY What changed: how much to take   glipiZIDE 10 MG tablet Commonly known as: GLUCOTROL Take 10 mg by mouth daily before breakfast.   levothyroxine 112 MCG tablet Commonly known as: SYNTHROID Take 112 mcg by mouth daily before breakfast.   losartan 100 MG tablet Commonly known as: COZAAR Take 1 tablet (100 mg total) by mouth daily.   metFORMIN 500 MG tablet Commonly known as: GLUCOPHAGE Take 1,000 mg by mouth every evening.   simvastatin 40 MG tablet Commonly known as: ZOCOR Take 20 mg by mouth daily.       Disposition:    Follow-up Information    Vickie Epley, MD Follow up on 12/05/2019.   Specialty: Cardiology Why: at 220 for post tikosyn follow up Contact information: [facility] [address] [phone]        [facility] Follow up on 11/03/2019.   Specialty: Cardiology Why: at 2 pm for tikosyn follow up in AF clinic. Parking code is Management consultant information: [address] [phone] [place] [phone]              Duration of Discharge Encounter: Greater than 30 minutes including physician time.  Jacalyn Lefevre, PA-C  10/27/2019 11:27 AM

## 2019-10-28 ENCOUNTER — Encounter (HOSPITAL_COMMUNITY): Payer: Self-pay | Admitting: Cardiology

## 2019-11-03 ENCOUNTER — Ambulatory Visit (HOSPITAL_COMMUNITY)
Admit: 2019-11-03 | Discharge: 2019-11-03 | Disposition: A | Discharge status: Home / Self Care | Payer: Medicare Other | Source: Ambulatory Visit | Attending: Physician Assistant | Admitting: Physician Assistant

## 2019-11-03 ENCOUNTER — Encounter (HOSPITAL_COMMUNITY): Payer: Self-pay | Admitting: Physician Assistant

## 2019-11-03 ENCOUNTER — Other Ambulatory Visit: Payer: Self-pay

## 2019-11-03 ENCOUNTER — Other Ambulatory Visit (HOSPITAL_COMMUNITY): Payer: Self-pay

## 2019-11-03 VITALS — BP 140/78 | HR 119 | Ht 65.0 in | Wt 229.6 lb

## 2019-11-03 DIAGNOSIS — Z9989 Dependence on other enabling machines and devices: Secondary | ICD-10-CM | POA: Insufficient documentation

## 2019-11-03 DIAGNOSIS — Z6838 Body mass index (BMI) 38.0-38.9, adult: Secondary | ICD-10-CM | POA: Insufficient documentation

## 2019-11-03 DIAGNOSIS — Z9049 Acquired absence of other specified parts of digestive tract: Secondary | ICD-10-CM | POA: Diagnosis not present

## 2019-11-03 DIAGNOSIS — I4819 Other persistent atrial fibrillation: Secondary | ICD-10-CM | POA: Insufficient documentation

## 2019-11-03 DIAGNOSIS — I10 Essential (primary) hypertension: Secondary | ICD-10-CM | POA: Insufficient documentation

## 2019-11-03 DIAGNOSIS — G4733 Obstructive sleep apnea (adult) (pediatric): Secondary | ICD-10-CM | POA: Diagnosis not present

## 2019-11-03 DIAGNOSIS — Z823 Family history of stroke: Secondary | ICD-10-CM | POA: Diagnosis not present

## 2019-11-03 DIAGNOSIS — D6869 Other thrombophilia: Secondary | ICD-10-CM | POA: Diagnosis not present

## 2019-11-03 DIAGNOSIS — Z7984 Long term (current) use of oral hypoglycemic drugs: Secondary | ICD-10-CM | POA: Insufficient documentation

## 2019-11-03 DIAGNOSIS — I484 Atypical atrial flutter: Secondary | ICD-10-CM | POA: Diagnosis not present

## 2019-11-03 DIAGNOSIS — E669 Obesity, unspecified: Secondary | ICD-10-CM | POA: Diagnosis not present

## 2019-11-03 DIAGNOSIS — Z79899 Other long term (current) drug therapy: Secondary | ICD-10-CM | POA: Diagnosis not present

## 2019-11-03 DIAGNOSIS — E039 Hypothyroidism, unspecified: Secondary | ICD-10-CM | POA: Diagnosis not present

## 2019-11-03 DIAGNOSIS — Z79890 Hormone replacement therapy (postmenopausal): Secondary | ICD-10-CM | POA: Diagnosis not present

## 2019-11-03 DIAGNOSIS — Z7901 Long term (current) use of anticoagulants: Secondary | ICD-10-CM | POA: Insufficient documentation

## 2019-11-03 DIAGNOSIS — Z8249 Family history of ischemic heart disease and other diseases of the circulatory system: Secondary | ICD-10-CM | POA: Insufficient documentation

## 2019-11-03 DIAGNOSIS — Z87891 Personal history of nicotine dependence: Secondary | ICD-10-CM | POA: Insufficient documentation

## 2019-11-03 DIAGNOSIS — E119 Type 2 diabetes mellitus without complications: Secondary | ICD-10-CM | POA: Diagnosis not present

## 2019-11-03 LAB — CBC
HCT: 28.6 % — ABNORMAL LOW (ref 36.0–46.0)
Hemoglobin: 7.4 g/dL — ABNORMAL LOW (ref 12.0–15.0)
MCH: 19.6 pg — ABNORMAL LOW (ref 26.0–34.0)
MCHC: 25.9 g/dL — ABNORMAL LOW (ref 30.0–36.0)
MCV: 75.9 fL — ABNORMAL LOW (ref 80.0–100.0)
Platelets: 431 10*3/uL — ABNORMAL HIGH (ref 150–400)
RBC: 3.77 MIL/uL — ABNORMAL LOW (ref 3.87–5.11)
RDW: 19.4 % — ABNORMAL HIGH (ref 11.5–15.5)
WBC: 9.3 10*3/uL (ref 4.0–10.5)
nRBC: 0.3 % — ABNORMAL HIGH (ref 0.0–0.2)

## 2019-11-03 LAB — BASIC METABOLIC PANEL
Anion gap: 11 (ref 5–15)
BUN: 14 mg/dL (ref 8–23)
CO2: 25 mmol/L (ref 22–32)
Calcium: 10 mg/dL (ref 8.9–10.3)
Chloride: 102 mmol/L (ref 98–111)
Creatinine, Ser: 1.12 mg/dL — ABNORMAL HIGH (ref 0.44–1.00)
GFR calc Af Amer: 53 mL/min — ABNORMAL LOW (ref >60)
GFR calc non Af Amer: 46 mL/min — ABNORMAL LOW (ref >60)
Glucose, Bld: 214 mg/dL — ABNORMAL HIGH (ref 70–99)
Potassium: 5.2 mmol/L — ABNORMAL HIGH (ref 3.5–5.1)
Sodium: 138 mmol/L (ref 135–145)

## 2019-11-03 LAB — MAGNESIUM: Magnesium: 2.2 mg/dL (ref 1.7–2.4)

## 2019-11-03 MED ORDER — LOSARTAN POTASSIUM 100 MG PO TABS
100.0000 mg | ORAL_TABLET | Freq: Every day | ORAL | 3 refills | Status: DC
Start: 2019-11-03 — End: 2020-01-04

## 2019-11-03 NOTE — Patient Instructions (Signed)
Cardioversion scheduled for Wednesday, September 1st  - Arrive at the [facility] and go to admitting at [facility] not eat or drink anything after midnight the night prior to your procedure.  - Take all your morning medication (except diabetic medications) with a sip of water prior to arrival.  - You will not be able to drive home after your procedure.  - Do NOT miss any doses of your blood thinner - if you should miss a dose please notify our office immediately.

## 2019-11-03 NOTE — Progress Notes (Signed)
Primary Care Physician: Caryl Bis, MD Primary Cardiologist: Dr Harl Bowie Primary Electrophysiologist: Dr Quentin Ore Referring Physician: Levell July NP   Miranda Wilkinson is a 84 y.o. female with a history of persistent atrial fibrillation, HTN, hypothyroidism, GERD, DM, and OSA who presents for follow up in the [facility].  The patient was initially diagnosed with atrial fibrillation 01/2019 on a preoperative ECG. Patient is on Eliquis for a CHADS2VASC score of 5. She appears to have been in persistent afib since then with overall rate control. Patient has had intermittent symptoms of generalized weakness and SOB especially with exertion. She was seen by Levell July 10/10/19 and was in afib with RVR. She was changed from metoprolol to bisoprolol and diltiazem. She denies bleeding issues with anticoagulation.   On follow up today, patient is s/p dofetilide loading 8/16-8/19/21. She initially converted on the medication but reverted back to afib. She underwent DCCV on 10/27/19 which only briefly converted her to [facility]. She remains in atrial flutter today although symptomatically she does feel better with more energy and less SOB.   Today, she denies symptoms of palpitations, chest pain, orthopnea, PND, lower extremity edema, dizziness, presyncope, syncope, bleeding, or neurologic sequela. The patient is tolerating medications without difficulties and is otherwise without complaint today.    Atrial Fibrillation Risk Factors:  she does have symptoms or diagnosis of sleep apnea. she is compliant with CPAP therapy. she does not have a history of rheumatic fever.   she has a BMI of Body mass index is 38.21 kg/m.Marland Kitchen Filed Weights   11/03/19 1403  Weight: 104.1 kg    Family History  Problem Relation Age of Onset  . Stroke Mother        brain hemmorhage  . Diabetes Mother   . Heart disease Mother   . Thyroid disease Mother   . Arrhythmia Mother   . Stroke Father     . Heart attack Father        blood clot in brain  . Stroke Sister   . Cancer Sister   . Stomach cancer Brother   . Hypertension Brother   . Stroke Brother   . Hypertension Brother   . Arrhythmia Sister   . Hypertension Sister      Atrial Fibrillation Management history:  Previous antiarrhythmic drugs: dofetilide  Previous cardioversions: 10/27/19 Previous ablations: none CHADS2VASC score: 5 Anticoagulation history: Eliquis   Past Medical History:  Diagnosis Date  . Anxiety   . Cancer ([place]) 1976   bilaterl thyroid ca  . Diabetes mellitus without complication ([place])   . GERD (gastroesophageal reflux disease)   . Headache(784.0)    hx migraines  . Hypertension   . Hypothyroidism   . Shortness of breath   . Sinus congestion   . Sleep apnea    study done in [place], [place], use CPAP   Past Surgical History:  Procedure Laterality Date  . APPENDECTOMY    . CARDIOVERSION N/A 10/27/2019   Procedure: CARDIOVERSION;  Surgeon: [name], MD;  Location: [facility] ENDOSCOPY;  Service: Cardiovascular;  Laterality: N/A;  . CHOLECYSTECTOMY    . OPEN REDUCTION INTERNAL FIXATION (ORIF) TIBIA/FIBULA FRACTURE Right 11/03/2013   Procedure: OPEN REDUCTION INTERNAL FIXATION (ORIF) RIGHT TIBIA/FIBULA PILON FRACTURE;  Surgeon: Wylene Simmer, MD;  Location: [facility];  Service: Orthopedics;  Laterality: Right;  . OVARIAN CYST SURGERY Left    age 80  . THYROID EXPLORATION    . TUBAL LIGATION  Current Outpatient Medications  Medication Sig Dispense Refill  . bisoprolol (ZEBETA) 10 MG tablet Take 1 tablet (10 mg total) by mouth daily. 30 tablet 6  . cetirizine (ZYRTEC) 10 MG tablet Take 10 mg by mouth as needed for allergies.    . Cholecalciferol (VITAMIN D3) 50 MCG (2000 UT) capsule Take 2,000 Units by mouth daily.    Marland Kitchen dofetilide (TIKOSYN) 500 MCG capsule Take 1 capsule (500 mcg total) by mouth 2 (two) times daily. 60 capsule 2  . ELIQUIS 5 MG TABS tablet TAKE 1 TABLET TWICE A DAY 180  tablet 3  . glipiZIDE (GLUCOTROL) 10 MG tablet Take 10 mg by mouth daily before breakfast.    . levothyroxine (SYNTHROID) 112 MCG tablet Take 112 mcg by mouth daily before breakfast.    . losartan (COZAAR) 100 MG tablet Take 1 tablet (100 mg total) by mouth daily. 90 tablet 3  . Magnesium 250 MG TABS Take by mouth.    . melatonin 5 MG TABS Take 5 mg by mouth at bedtime as needed.    . metFORMIN (GLUCOPHAGE) 500 MG tablet Take 1,000 mg by mouth every evening.     . Multiple Vitamins-Minerals (MULTIVITAMIN WITH MINERALS) tablet Take 1 tablet by mouth daily.    . simvastatin (ZOCOR) 40 MG tablet Take 20 mg by mouth daily.     Marland Kitchen umeclidinium-vilanterol (ANORO ELLIPTA) 62.5-25 MCG/INH AEPB Only open the device one time and then take your two separate drags to be sure you get it all 1 each 11   No current facility-administered medications for this encounter.    No Known Allergies  Social History   Socioeconomic History  . Marital status: Single    Spouse name: Not on file  . Number of children: Not on file  . Years of education: Not on file  . Highest education level: Not on file  Occupational History  . Not on file  Tobacco Use  . Smoking status: Former Smoker    Packs/day: 1.50    Years: 60.00    Pack years: 90.00    Types: Cigarettes    Quit date: 11/03/2011    Years since quitting: 8.0  . Smokeless tobacco: Never Used  Vaping Use  . Vaping Use: Never used  Substance and Sexual Activity  . Alcohol use: No  . Drug use: No  . Sexual activity: Not Currently  Other Topics Concern  . Not on file  Social History Narrative  . Not on file   Social Determinants of Health   Financial Resource Strain:   . Difficulty of Paying Living Expenses: Not on file  Food Insecurity:   . Worried About Charity fundraiser in the Last Year: Not on file  . Ran Out of Food in the Last Year: Not on file  Transportation Needs:   . Lack of Transportation (Medical): Not on file  . Lack of  Transportation (Non-Medical): Not on file  Physical Activity:   . Days of Exercise per Week: Not on file  . Minutes of Exercise per Session: Not on file  Stress:   . Feeling of Stress : Not on file  Social Connections:   . Frequency of Communication with Friends and Family: Not on file  . Frequency of Social Gatherings with Friends and Family: Not on file  . Attends Religious Services: Not on file  . Active Member of Clubs or Organizations: Not on file  . Attends Archivist Meetings: Not on file  .  Marital Status: Not on file  Intimate Partner Violence:   . Fear of Current or Ex-Partner: Not on file  . Emotionally Abused: Not on file  . Physically Abused: Not on file  . Sexually Abused: Not on file     ROS- All systems are reviewed and negative except as per the HPI above.  Physical Exam: Vitals:   11/03/19 1403  BP: 140/78  Pulse: (!) 119  Weight: 104.1 kg  Height: 5\' 5"  (1.651 m)    GEN- The patient is well appearing obese elderly female, alert and oriented x 3 today.   HEENT-head normocephalic, atraumatic, sclera clear, conjunctiva pink, hearing intact, trachea midline. Lungs- Clear to ausculation bilaterally, normal work of breathing Heart- irregular rate and rhythm, no murmurs, rubs or gallops  GI- soft, NT, ND, + BS Extremities- no clubbing, cyanosis, or edema MS- no significant deformity or atrophy Skin- no rash or lesion Psych- euthymic mood, full affect Neuro- strength and sensation are intact   Wt Readings from Last 3 Encounters:  11/03/19 104.1 kg  10/27/19 105.8 kg  10/24/19 107.3 kg    EKG today demonstrates atypical atrial flutter with variable block, HR 119, QRS 68, QTc 506  Echo 02/24/19 demonstrated  1. Left ventricular ejection fraction, by visual estimation, is 70 to  75%. The left ventricle has hyperdynamic function. There is no left  ventricular hypertrophy.  2. Left ventricular diastolic parameters are indeterminate.  3. The  left ventricle has no regional wall motion abnormalities.  4. Global right ventricle has normal systolic function.The right  ventricular size is normal. No increase in right ventricular wall  thickness.  5. Left atrial size was normal.  6. Right atrial size was normal.  7. The mitral valve is normal in structure. Trivial mitral valve  regurgitation. No evidence of mitral stenosis.  8. The tricuspid valve is normal in structure. Tricuspid valve  regurgitation is not demonstrated.  9. The aortic valve has an indeterminant number of cusps. Aortic valve  regurgitation is not visualized. Mild aortic valve sclerosis without  stenosis.  10. The pulmonic valve was not well visualized. Pulmonic valve  regurgitation is not visualized.  11. Normal pulmonary artery systolic pressure.   Epic records are reviewed at length today  CHA2DS2-VASc Score = 5  The patient's score is based upon: CHF History: 0 HTN History: 1 Age : 4 Diabetes History: 1 Stroke History: 0 Vascular Disease History: 0 Gender: 1      ASSESSMENT AND PLAN: 1. Persistent Atrial Fibrillation/atypical atrial flutter The patient's CHA2DS2-VASc score is 5, indicating a 7.2% annual risk of stroke.   S/p dofetilide loading 8/16-8/19/21. S/p DCCV on 10/27/19. We discussed therapeutic options today. Will plan for repeat DCCV now that she has been dofetilide for another week. Continue dofetilide 500 mcg BID. Check bmet/mag/CBC today. Continue Eliquis 5 mg BID, denies any missed doses of anticoagulation.  Continue bisoprolol 5 mg daily Continue diltiazem 180 mg daily If DCCV fails, would consider switching to amiodarone.  2. Secondary Hypercoagulable State (ICD10:  D68.69) The patient is at significant risk for stroke/thromboembolism based upon her CHA2DS2-VASc Score of 5.  Continue Apixaban (Eliquis).   3. Obesity Body mass index is 38.21 kg/m. Lifestyle modification was discussed and encouraged including regular  physical activity and weight reduction.  4. Obstructive sleep apnea Patient reports compliance with CPAP therapy.   5. HTN Stable, no changes today.   Follow up in the AF clinic one week post DCCV.    Adline Peals  PA-C [facility] [address] [place],  [address] [phone] 11/03/2019 2:39 PM

## 2019-11-04 ENCOUNTER — Emergency Department (HOSPITAL_COMMUNITY): Payer: Medicare Other

## 2019-11-04 ENCOUNTER — Other Ambulatory Visit: Payer: Self-pay

## 2019-11-04 ENCOUNTER — Observation Stay (HOSPITAL_COMMUNITY)
Admission: EM | Admit: 2019-11-04 | Discharge: 2019-11-05 | Disposition: A | Discharge status: Home / Self Care | Payer: Medicare Other | Attending: Internal Medicine | Admitting: Internal Medicine

## 2019-11-04 ENCOUNTER — Encounter (HOSPITAL_COMMUNITY): Payer: Self-pay

## 2019-11-04 DIAGNOSIS — Z79899 Other long term (current) drug therapy: Secondary | ICD-10-CM | POA: Insufficient documentation

## 2019-11-04 DIAGNOSIS — R0602 Shortness of breath: Secondary | ICD-10-CM | POA: Diagnosis not present

## 2019-11-04 DIAGNOSIS — D649 Anemia, unspecified: Secondary | ICD-10-CM | POA: Diagnosis not present

## 2019-11-04 DIAGNOSIS — Z20822 Contact with and (suspected) exposure to covid-19: Secondary | ICD-10-CM | POA: Insufficient documentation

## 2019-11-04 DIAGNOSIS — E119 Type 2 diabetes mellitus without complications: Secondary | ICD-10-CM

## 2019-11-04 DIAGNOSIS — Z85850 Personal history of malignant neoplasm of thyroid: Secondary | ICD-10-CM | POA: Diagnosis not present

## 2019-11-04 DIAGNOSIS — I10 Essential (primary) hypertension: Secondary | ICD-10-CM | POA: Insufficient documentation

## 2019-11-04 DIAGNOSIS — Z87891 Personal history of nicotine dependence: Secondary | ICD-10-CM | POA: Insufficient documentation

## 2019-11-04 DIAGNOSIS — I4892 Unspecified atrial flutter: Secondary | ICD-10-CM | POA: Insufficient documentation

## 2019-11-04 DIAGNOSIS — J449 Chronic obstructive pulmonary disease, unspecified: Secondary | ICD-10-CM | POA: Diagnosis not present

## 2019-11-04 DIAGNOSIS — I4819 Other persistent atrial fibrillation: Secondary | ICD-10-CM | POA: Diagnosis present

## 2019-11-04 DIAGNOSIS — R079 Chest pain, unspecified: Secondary | ICD-10-CM | POA: Insufficient documentation

## 2019-11-04 DIAGNOSIS — E039 Hypothyroidism, unspecified: Secondary | ICD-10-CM | POA: Insufficient documentation

## 2019-11-04 DIAGNOSIS — Z7901 Long term (current) use of anticoagulants: Secondary | ICD-10-CM | POA: Diagnosis not present

## 2019-11-04 DIAGNOSIS — S2231XA Fracture of one rib, right side, initial encounter for closed fracture: Secondary | ICD-10-CM | POA: Diagnosis not present

## 2019-11-04 DIAGNOSIS — E785 Hyperlipidemia, unspecified: Secondary | ICD-10-CM | POA: Diagnosis not present

## 2019-11-04 DIAGNOSIS — D509 Iron deficiency anemia, unspecified: Principal | ICD-10-CM | POA: Diagnosis present

## 2019-11-04 DIAGNOSIS — R531 Weakness: Secondary | ICD-10-CM | POA: Diagnosis present

## 2019-11-04 DIAGNOSIS — J90 Pleural effusion, not elsewhere classified: Secondary | ICD-10-CM | POA: Diagnosis not present

## 2019-11-04 DIAGNOSIS — I517 Cardiomegaly: Secondary | ICD-10-CM | POA: Diagnosis not present

## 2019-11-04 LAB — ABO/RH: ABO/RH(D): B POS

## 2019-11-04 LAB — COMPREHENSIVE METABOLIC PANEL
ALT: 21 U/L (ref 0–44)
AST: 37 U/L (ref 15–41)
Albumin: 3.4 g/dL — ABNORMAL LOW (ref 3.5–5.0)
Alkaline Phosphatase: 72 U/L (ref 38–126)
Anion gap: 12 (ref 5–15)
BUN: 17 mg/dL (ref 8–23)
CO2: 23 mmol/L (ref 22–32)
Calcium: 9.6 mg/dL (ref 8.9–10.3)
Chloride: 100 mmol/L (ref 98–111)
Creatinine, Ser: 1.12 mg/dL — ABNORMAL HIGH (ref 0.44–1.00)
GFR calc Af Amer: 53 mL/min — ABNORMAL LOW (ref >60)
GFR calc non Af Amer: 46 mL/min — ABNORMAL LOW (ref >60)
Glucose, Bld: 177 mg/dL — ABNORMAL HIGH (ref 70–99)
Potassium: 4.6 mmol/L (ref 3.5–5.1)
Sodium: 135 mmol/L (ref 135–145)
Total Bilirubin: 0.6 mg/dL (ref 0.3–1.2)
Total Protein: 7.2 g/dL (ref 6.5–8.1)

## 2019-11-04 LAB — CBC
HCT: 27.5 % — ABNORMAL LOW (ref 36.0–46.0)
Hemoglobin: 7.4 g/dL — ABNORMAL LOW (ref 12.0–15.0)
MCH: 20.2 pg — ABNORMAL LOW (ref 26.0–34.0)
MCHC: 26.9 g/dL — ABNORMAL LOW (ref 30.0–36.0)
MCV: 75.1 fL — ABNORMAL LOW (ref 80.0–100.0)
Platelets: 443 10*3/uL — ABNORMAL HIGH (ref 150–400)
RBC: 3.66 MIL/uL — ABNORMAL LOW (ref 3.87–5.11)
RDW: 19.5 % — ABNORMAL HIGH (ref 11.5–15.5)
WBC: 11.6 10*3/uL — ABNORMAL HIGH (ref 4.0–10.5)
nRBC: 0.2 % (ref 0.0–0.2)

## 2019-11-04 LAB — RETICULOCYTES
Immature Retic Fract: 37.9 % — ABNORMAL HIGH (ref 2.3–15.9)
RBC.: 3.45 MIL/uL — ABNORMAL LOW (ref 3.87–5.11)
Retic Count, Absolute: 95.9 10*3/uL (ref 19.0–186.0)
Retic Ct Pct: 2.8 % (ref 0.4–3.1)

## 2019-11-04 LAB — IRON AND TIBC
Iron: 18 ug/dL — ABNORMAL LOW (ref 28–170)
Saturation Ratios: 4 % — ABNORMAL LOW (ref 10.4–31.8)
TIBC: 500 ug/dL — ABNORMAL HIGH (ref 250–450)
UIBC: 482 ug/dL

## 2019-11-04 LAB — SARS CORONAVIRUS 2 BY RT PCR (HOSPITAL ORDER, PERFORMED IN ~~LOC~~ HOSPITAL LAB): SARS Coronavirus 2: NEGATIVE

## 2019-11-04 LAB — FERRITIN: Ferritin: 11 ng/mL (ref 11–307)

## 2019-11-04 LAB — POC OCCULT BLOOD, ED: Fecal Occult Bld: NEGATIVE

## 2019-11-04 LAB — VITAMIN B12: Vitamin B-12: 252 pg/mL (ref 180–914)

## 2019-11-04 LAB — FOLATE: Folate: 20 ng/mL (ref >5.9)

## 2019-11-04 MED ORDER — GLIPIZIDE 5 MG PO TABS
10.0000 mg | ORAL_TABLET | Freq: Every day | ORAL | Status: DC
  Administered 2019-11-05: 10 mg via ORAL
  Filled 2019-11-04: qty 2

## 2019-11-04 MED ORDER — LEVOTHYROXINE SODIUM 112 MCG PO TABS
112.0000 ug | ORAL_TABLET | Freq: Every day | ORAL | Status: DC
  Administered 2019-11-05: 112 ug via ORAL
  Filled 2019-11-04: qty 1

## 2019-11-04 MED ORDER — ACETAMINOPHEN 325 MG PO TABS: 650.0000 mg | ORAL_TABLET | Freq: Four times a day (QID) | ORAL | Status: DC | PRN

## 2019-11-04 MED ORDER — SODIUM CHLORIDE 0.9 % IV SOLN
510.0000 mg | Freq: Once | INTRAVENOUS | Status: AC
  Administered 2019-11-05: 510 mg via INTRAVENOUS
  Filled 2019-11-04: qty 17

## 2019-11-04 MED ORDER — SODIUM CHLORIDE 0.9% FLUSH
3.0000 mL | Freq: Two times a day (BID) | INTRAVENOUS | Status: DC
  Administered 2019-11-05 (×2): 3 mL via INTRAVENOUS

## 2019-11-04 MED ORDER — BISOPROLOL FUMARATE 5 MG PO TABS
10.0000 mg | ORAL_TABLET | Freq: Every day | ORAL | Status: DC
  Administered 2019-11-05: 10 mg via ORAL
  Filled 2019-11-04: qty 2

## 2019-11-04 MED ORDER — LOSARTAN POTASSIUM 50 MG PO TABS
100.0000 mg | ORAL_TABLET | Freq: Every day | ORAL | Status: DC
  Administered 2019-11-05: 100 mg via ORAL
  Filled 2019-11-04: qty 2

## 2019-11-04 MED ORDER — APIXABAN 5 MG PO TABS
5.0000 mg | ORAL_TABLET | Freq: Two times a day (BID) | ORAL | Status: DC
  Administered 2019-11-04 – 2019-11-05 (×2): 5 mg via ORAL
  Filled 2019-11-04 (×2): qty 1

## 2019-11-04 MED ORDER — SIMVASTATIN 20 MG PO TABS: 20.0000 mg | ORAL_TABLET | Freq: Every day | ORAL | Status: DC

## 2019-11-04 MED ORDER — MAGNESIUM OXIDE 400 (241.3 MG) MG PO TABS: 200.0000 mg | ORAL_TABLET | ORAL | Status: DC

## 2019-11-04 MED ORDER — MELATONIN 3 MG PO TABS
3.0000 mg | ORAL_TABLET | Freq: Every evening | ORAL | Status: DC | PRN
  Filled 2019-11-04: qty 1

## 2019-11-04 MED ORDER — ACETAMINOPHEN 650 MG RE SUPP: 650.0000 mg | Freq: Four times a day (QID) | RECTAL | Status: DC | PRN

## 2019-11-04 MED ORDER — SODIUM CHLORIDE 0.9% IV SOLUTION: Freq: Once | INTRAVENOUS | Status: AC

## 2019-11-04 MED ORDER — METFORMIN HCL ER 500 MG PO TB24
1000.0000 mg | ORAL_TABLET | Freq: Every day | ORAL | Status: DC
  Filled 2019-11-04: qty 2

## 2019-11-04 MED ORDER — DOFETILIDE 500 MCG PO CAPS
500.0000 ug | ORAL_CAPSULE | Freq: Two times a day (BID) | ORAL | Status: DC
  Administered 2019-11-04 – 2019-11-05 (×2): 500 ug via ORAL
  Filled 2019-11-04 (×3): qty 1

## 2019-11-04 NOTE — ED Triage Notes (Signed)
Per daughter A-Fib clinic sent her here d/t Hgb 7.3, pt usually runs 8.0. they are wanting her admitted for further evaluation. Pt with no complaints

## 2019-11-04 NOTE — H&P (Signed)
Date: 11/04/2019               Patient Name:  JANAYSHA DEPAULO MRN: [id number]  DOB: 16-Sep-1935 Age / Sex: 84 y.o., female   PCP: Caryl Bis, MD         Medical Service: Internal Medicine Teaching Service         Attending Physician: Dr. Evette Doffing, Mallie Mussel, *    First Contact: Dr. Alexandria Lodge Pager: [phone]  Second Contact: Dr. Modena Nunnery Pager: [phone]       After Hours (After 5p/  First Contact Pager: [phone]  weekends / holidays): Second Contact Pager: [phone]   Chief Complaint: Anemia  History of Present Illness: Ms. Demaya Hardge. Fransisca Connors is an 84 year old female with past medical history significant for persistent atrial fibrillation (on Eliquis,) HTN, hypothyroidism, GERD, T2DM, and OSA who presented to [facility] on 11/04/19 for evaluation of anemia.  Patient reports a history of feeling fatigued for the past several years. During this time, she was newly diagnosed with atrial fibrillation on a preoperative ECG in November of 2020. Since then, she has followed closely with cardiology for management of this condition. Approximately four weeks ago, she noted that her shortness of breath had worsened from her baseline with continuation of her fatigue. She states that her shortness of breath is particularly bothersome with activity. She was referred for continue management of her condition to [facility] approximately two weeks ago. Due to inadequate control of her condition, she was admitted for loading with dofetilide from 10/25/19 to 10/27/19. During her hospitalization, she required cardioversion x3, however she continued to return to atrial fibrillation shortly after cardioversion. She was discharged with plans to continue dofetilide and follow-up in one week with atrial fibrillation clinic. During her follow-up visit yesterday, she was noted to be in atrial flutter with a hemoglobin of 7.4, however she endorsed having more energy and less shortness  of breath. ED physician spoke with patient's PCP and reportedly her last hemoglobin was 12.5 obtained in January 2020. She denies any recent bleeding, headaches, chest pain, leg pain, hematemesis, hemoptysis, hematochezia, lightheadedness, dizziness, abdominal pain, vomiting, diarrhea. She does endorse one black bowel movement several weeks ago, however she has had no repeat episodes. Her last colonoscopy was greater than ten years ago.  Of note, she has a past history of pernicious anemia during her childhood and received vitamin B12 injections. Her last injection was approximately in the 1980s, and she has not had a known history of anemia since then.  ED Course: On arrival to the ED, patient was tachycardic (111), but otherwise hemodynamically stable with negative orthostatics. CBC: WBC 11.6, Hb 7.4, PLT 443; reticulocyte count 95.9; iron 18, TIBC 500, ferritin 11; hemoccult negative, B12 and folate within normal limits. EKG revealed normal sinus rhythm. CXR revealed no acute abnormality. She was admitted to [facility]MT[facility] for further evaluation and management of her symptomatic anemia.  Meds: Current Meds  Medication Sig  . acetaminophen (TYLENOL) 500 MG tablet Take 500-1,000 mg by mouth every 6 (six) hours as needed (for pain.).  Marland Kitchen bisoprolol (ZEBETA) 10 MG tablet Take 1 tablet (10 mg total) by mouth daily.  . cetirizine (ZYRTEC) 10 MG tablet Take 10 mg by mouth daily as needed for allergies.   . Cholecalciferol (VITAMIN D3) 50 MCG (2000 UT) capsule Take 2,000 Units by mouth daily.  Marland Kitchen dofetilide (TIKOSYN) 500 MCG capsule Take 1 capsule (500 mcg total) by mouth 2 (two) times  daily. (Patient taking differently: Take 500 mcg by mouth 2 (two) times daily. (0900 & 2100))  . ELIQUIS 5 MG TABS tablet TAKE 1 TABLET TWICE A DAY (Patient taking differently: Take 5 mg by mouth 2 (two) times daily. (0900 & 2100))  . glipiZIDE (GLUCOTROL) 10 MG tablet Take 10 mg by mouth daily.   Marland Kitchen levothyroxine (SYNTHROID) 112 MCG  tablet Take 112 mcg by mouth daily.   Marland Kitchen losartan (COZAAR) 100 MG tablet Take 1 tablet (100 mg total) by mouth daily.  . Magnesium 250 MG TABS Take 250 mg by mouth every other day. In the morning.  . melatonin 5 MG TABS Take 1.25-5 mg by mouth at bedtime as needed (sleep).   . metFORMIN (GLUCOPHAGE-XR) 500 MG 24 hr tablet Take 1,000 mg by mouth daily with supper.  . Multiple Vitamins-Minerals (MULTIVITAMIN WITH MINERALS) tablet Take 1 tablet by mouth daily.  . simvastatin (ZOCOR) 40 MG tablet Take 20 mg by mouth daily with supper.   . umeclidinium-vilanterol (ANORO ELLIPTA) 62.5-25 MCG/INH AEPB Only open the device one time and then take your two separate drags to be sure you get it all (Patient taking differently: Inhale 2 puffs into the lungs daily as needed (respiratory issues.). Only open the device one time and then take your two separate drags to be sure you get it all)   Allergies: Allergies as of 11/04/2019  . (No Known Allergies)   Past Medical History:  Diagnosis Date  . Anxiety   . Cancer ([place]) 1976   bilaterl thyroid ca  . Diabetes mellitus without complication ([place])   . GERD (gastroesophageal reflux disease)   . Headache(784.0)    hx migraines  . Hypertension   . Hypothyroidism   . Shortness of breath   . Sinus congestion   . Sleep apnea    study done in [place], [place], use CPAP   Family History:  Family History  Problem Relation Age of Onset  . Stroke Mother        brain hemmorhage  . Diabetes Mother   . Heart disease Mother   . Thyroid disease Mother   . Arrhythmia Mother   . Stroke Father   . Heart attack Father        blood clot in brain  . Stroke Sister   . Cancer Sister   . Stomach cancer Brother   . Hypertension Brother   . Stroke Brother   . Hypertension Brother   . Arrhythmia Sister   . Hypertension Sister    Social History:  Patient reports quitting smoking approximately seven years ago, however she has a 90-pack-years smoking history. She denies  alcohol and recreational drug use. Her daughter helps her with her medical care.  Review of Systems: A complete ROS was negative except as per HPI.  Physical Exam: Blood pressure (!) 145/58, pulse 71, temperature 98.2 F (36.8 C), temperature source Oral, resp. rate 16, SpO2 97 %. Physical Exam Constitutional:      General: She is not in acute distress.    Appearance: Normal appearance. She is obese. She is not ill-appearing.  HENT:     Head: Normocephalic and atraumatic.  Eyes:     Extraocular Movements: Extraocular movements intact.     Conjunctiva/sclera: Conjunctivae normal.  Cardiovascular:     Rate and Rhythm: Normal rate and regular rhythm.     Pulses: Normal pulses.     Heart sounds: Normal heart sounds.  Pulmonary:     Effort: Pulmonary effort is  normal. No respiratory distress.     Breath sounds: Normal breath sounds.  Abdominal:     General: Abdomen is flat. Bowel sounds are normal.     Palpations: Abdomen is soft.  Musculoskeletal:        General: No tenderness. Normal range of motion.     Cervical back: Normal range of motion and neck supple.     Comments: Trace pitting edema of bilateral lower extremities  Skin:    General: Skin is warm and dry.     Capillary Refill: Capillary refill takes less than 2 seconds.     Comments: Well demarcated erythematous plaques with overlying silvery scale on the bilateral lower extremities.  Neurological:     General: No focal deficit present.     Mental Status: She is alert and oriented to person, place, and time. Mental status is at baseline.  Psychiatric:        Mood and Affect: Mood normal.        Behavior: Behavior normal.        Thought Content: Thought content normal.        Judgment: Judgment normal.    EKG: personally reviewed my interpretation is normal sinus rhythm  CXR: personally reviewed my interpretation is no acute cardiopulmonary abnormality.  Assessment & Plan by Problem: Active Problems:    Symptomatic anemia  Ms. Gypsy Lore. Fransisca Connors is an 84 year old female with past medical history significant for persistent atrial fibrillation (on Eliquis,) HTN, hypothyroidism, GERD, T2DM, and OSA who presented to [facility] on 11/04/19 for evaluation of anemia found to have iron deficiency anemia.  #Symptomatic iron deficiency anemia Patient presenting to ED from atrial fibrillation clinic for hemoglobin of 7.4 with last known hemoglobin nine months prior of 12.5. Per the patient's history, it is unclear whether this is an acute drop of chronic progression over that time. Initial labs in the ED reveal that the patient has iron deficiency anemia. She also denies any symptoms that are particularly concerning for an acute bleed. She does report an episode of "black" stool approximately two months ago, however she has had no repeat episodes and her hemoccult today is negative. Her last colonoscopy was greater than ten years ago, therefore she is due for repeat evaluation. Given her subjective complaints of fatigue and shortness of breath with exertion, she does have symptomatic anemia and requires admission for further evaluation and management of this condition. -Transfuse 1u PRBC -Iron infusion -CBC in AM -BMP in AM -Outpatient follow-up upon discharge  #Atrial fibrillation #Atrial flutter Patient has history of atrial fibrillation diagnosed in November of 2020 with recent inadequate rate and rhythm control over the past four weeks. She has required recent admission for initiation of dofetilide and has had three cardioversions in the past two weeks with repeat cardioversion planned for September 1st. Fortunately, she is currently in normal sinus rhythm with no complaints of palpitations, chest pain, or shortness of breath at rest. Progression of her atrial fibrillation may be secondary to this underlying anemia. -Continue home apixaban -Continue home dofetilide -Continue home bisoprolol  #T2DM,  chronic Patient reports adequate control of her diabetes at home with her current medication regimen of glipizide and metformin. -Continue home glipizide -Continue home metformin  #Hypothyroidism, chronic -Continue home levothyroxine  #HTN, chronic -Continue home losartan  #HLD, chronic -Continue home simvastatin  #Code status: Full #IVF: None #Diet: Heart healthy #VTE ppx: Eliquis  Dispo: Admit patient to Observation with expected length of stay less than 2 midnights.  Signed:  Cato Mulligan, MD 11/04/2019, 10:39 PM  Pager: [phone] After 5pm on weekdays and 1pm on weekends: On Call pager: [phone]

## 2019-11-04 NOTE — ED Provider Notes (Signed)
[place] EMERGENCY DEPARTMENT Provider Note   CSN: [id number] Arrival date & time: 11/04/19  1007     History Chief Complaint  Patient presents with  . Anemia    Miranda Wilkinson is a 84 y.o. female.  HPI      Miranda Wilkinson is a 84 y.o. female, with a history of anxiety, DM, GERD, HTN, A. fib on Eliquis, presenting to the ED with low hemoglobin level.  Patient states she has had generalized weakness, shortness of breath, intermittent chest pain over the last 2 to 3 weeks. Her cardiologist thought perhaps this was due to her A. fib, therefore she was admitted in an attempt to further evaluate and try to control her A. fib.  She was started on Tikosyn during the admission.   She was diagnosed with A. fib November 2020 on a preop ECG and placed on Eliquis for CHA2DS2-VASc score of 5. She had labs drawn at the A. fib clinic yesterday.  These results returned today and a hemoglobin level of 7.4 was noted.  Patient was told to come straight to the emergency department for further evaluation of this issue. Patient does note she had one episode of dark stool a couple weeks ago, however, has not had similar any episodes since.  She has been vaccinated against Covid. Denies fever/chills, syncope, current chest pain, abdominal pain, hematochezia, urinary symptoms, acute lower extremity edema/pain, N/V/D, or any other complaints.  Past Medical History:  Diagnosis Date  . Anxiety   . Cancer ([place]) 1976   bilaterl thyroid ca  . Diabetes mellitus without complication ([place])   . GERD (gastroesophageal reflux disease)   . Headache(784.0)    hx migraines  . Hypertension   . Hypothyroidism   . Shortness of breath   . Sinus congestion   . Sleep apnea    study done in [place], [place], use CPAP    Patient Active Problem List   Diagnosis Date Noted  . Symptomatic anemia 11/04/2019  . Atypical atrial flutter ([place]) 11/03/2019  . Persistent atrial fibrillation ([place]) 10/14/2019    . Secondary hypercoagulable state ([place]) 10/14/2019  . COPD GOLD 0/ group B 09/26/2019  . Morbid obesity due to excess calories ([place]) comp by hyperlipidemia/ dm/ hbp 09/26/2019  . Depression 11/22/2013  . Hyperlipemia 11/22/2013  . Allergic rhinitis 11/22/2013  . Unspecified hypothyroidism 11/08/2013  . Type II or unspecified type diabetes mellitus without mention of complication, not stated as uncontrolled 11/08/2013  . Essential hypertension, benign 11/08/2013  . Closed right ankle fracture 11/03/2013    Past Surgical History:  Procedure Laterality Date  . APPENDECTOMY    . CARDIOVERSION N/A 10/27/2019   Procedure: CARDIOVERSION;  Surgeon: [name], MD;  Location: [facility] ENDOSCOPY;  Service: Cardiovascular;  Laterality: N/A;  . CHOLECYSTECTOMY    . OPEN REDUCTION INTERNAL FIXATION (ORIF) TIBIA/FIBULA FRACTURE Right 11/03/2013   Procedure: OPEN REDUCTION INTERNAL FIXATION (ORIF) RIGHT TIBIA/FIBULA PILON FRACTURE;  Surgeon: Wylene Simmer, MD;  Location: [place];  Service: Orthopedics;  Laterality: Right;  . OVARIAN CYST SURGERY Left    age 63  . THYROID EXPLORATION    . TUBAL LIGATION       OB History   No obstetric history on file.     Family History  Problem Relation Age of Onset  . Stroke Mother        brain hemmorhage  . Diabetes Mother   . Heart disease Mother   . Thyroid disease Mother   .  Arrhythmia Mother   . Stroke Father   . Heart attack Father        blood clot in brain  . Stroke Sister   . Cancer Sister   . Stomach cancer Brother   . Hypertension Brother   . Stroke Brother   . Hypertension Brother   . Arrhythmia Sister   . Hypertension Sister     Social History   Tobacco Use  . Smoking status: Former Smoker    Packs/day: 1.50    Years: 60.00    Pack years: 90.00    Types: Cigarettes    Quit date: 11/03/2011    Years since quitting: 8.0  . Smokeless tobacco: Never Used  Vaping Use  . Vaping Use: Never used  Substance Use Topics  .  Alcohol use: No  . Drug use: No    Home Medications Prior to Admission medications   Medication Sig Start Date End Date Taking? Authorizing Provider  acetaminophen (TYLENOL) 500 MG tablet Take 500-1,000 mg by mouth every 6 (six) hours as needed (for pain.).   Yes [provider]  bisoprolol (ZEBETA) 10 MG tablet Take 1 tablet (10 mg total) by mouth daily. 10/28/19  Yes Shirley Friar, PA-C  cetirizine (ZYRTEC) 10 MG tablet Take 10 mg by mouth daily as needed for allergies.    Yes [provider]  Cholecalciferol (VITAMIN D3) 50 MCG (2000 UT) capsule Take 2,000 Units by mouth daily.   Yes [provider]  dofetilide (TIKOSYN) 500 MCG capsule Take 1 capsule (500 mcg total) by mouth 2 (two) times daily. Patient taking differently: Take 500 mcg by mouth 2 (two) times daily. (0900 & 2100) 10/27/19  Yes Tillery, Satira Mccallum, PA-C  ELIQUIS 5 MG TABS tablet TAKE 1 TABLET TWICE A DAY Patient taking differently: Take 5 mg by mouth 2 (two) times daily. (0900 & 2100) 08/09/19  Yes Branch, Alphonse Guild, MD  glipiZIDE (GLUCOTROL) 10 MG tablet Take 10 mg by mouth daily.    Yes [provider]  levothyroxine (SYNTHROID) 112 MCG tablet Take 112 mcg by mouth daily.    Yes [provider]  losartan (COZAAR) 100 MG tablet Take 1 tablet (100 mg total) by mouth daily. 11/03/19  Yes Fenton, Clint R, PA  Magnesium 250 MG TABS Take 250 mg by mouth every other day. In the morning.   Yes [provider]  melatonin 5 MG TABS Take 1.25-5 mg by mouth at bedtime as needed (sleep).    Yes [provider]  metFORMIN (GLUCOPHAGE-XR) 500 MG 24 hr tablet Take 1,000 mg by mouth daily with supper.   Yes [provider]  Multiple Vitamins-Minerals (MULTIVITAMIN WITH MINERALS) tablet Take 1 tablet by mouth daily.   Yes [provider]  simvastatin (ZOCOR) 40 MG tablet Take 20 mg by mouth daily with supper.    Yes [provider]    umeclidinium-vilanterol (ANORO ELLIPTA) 62.5-25 MCG/INH AEPB Only open the device one time and then take your two separate drags to be sure you get it all Patient taking differently: Inhale 2 puffs into the lungs daily as needed (respiratory issues.). Only open the device one time and then take your two separate drags to be sure you get it all 09/26/19  Yes Tanda Rockers, MD    Allergies    Patient has no known allergies.  Review of Systems   Review of Systems  Constitutional: Positive for fatigue. Negative for chills, diaphoresis and fever.  Respiratory: Positive  for shortness of breath. Negative for cough.   Cardiovascular: Positive for chest pain. Negative for leg swelling.  Gastrointestinal: Negative for abdominal pain, blood in stool, diarrhea, nausea and vomiting.  Musculoskeletal: Negative for back pain.  Neurological: Positive for weakness. Negative for dizziness and syncope.  All other systems reviewed and are negative.   Physical Exam Updated Vital Signs BP (!) 136/56 (BP Location: Right Arm)   Pulse 70   Temp 98.2 F (36.8 C) (Oral)   Resp 16   SpO2 94%   Physical Exam Vitals and nursing note reviewed.  Constitutional:      General: She is not in acute distress.    Appearance: She is well-developed. She is not diaphoretic.  HENT:     Head: Normocephalic and atraumatic.     Mouth/Throat:     Mouth: Mucous membranes are moist.     Pharynx: Oropharynx is clear.  Eyes:     Conjunctiva/sclera: Conjunctivae normal.  Cardiovascular:     Rate and Rhythm: Normal rate and regular rhythm.     Pulses: Normal pulses.          Radial pulses are 2+ on the right side and 2+ on the left side.       Posterior tibial pulses are 2+ on the right side and 2+ on the left side.     Heart sounds: Normal heart sounds.     Comments: Tactile temperature in the extremities appropriate and equal bilaterally. Pulmonary:     Effort: Pulmonary effort is normal. No respiratory distress.      Breath sounds: Normal breath sounds.  Abdominal:     Palpations: Abdomen is soft.     Tenderness: There is no abdominal tenderness. There is no guarding.  Genitourinary:    Rectum: Guaiac result negative.     Comments: Rectal Exam:  No external hemorrhoids, fissures, or lesions noted.  No frank blood or melena. Some soft stool in rectal vault.  No rectal tenderness. No foreign bodies noted.   Musculoskeletal:     Cervical back: Neck supple.     Right lower leg: No edema.     Left lower leg: No edema.  Lymphadenopathy:     Cervical: No cervical adenopathy.  Skin:    General: Skin is warm and dry.  Neurological:     Mental Status: She is alert.  Psychiatric:        Mood and Affect: Mood and affect normal.        Speech: Speech normal.        Behavior: Behavior normal.     ED Results / Procedures / Treatments   Labs (all labs ordered are listed, but only abnormal results are displayed) Labs Reviewed  COMPREHENSIVE METABOLIC PANEL - Abnormal; Notable for the following components:      Result Value   Glucose, Bld 177 (*)    Creatinine, Ser 1.12 (*)    Albumin 3.4 (*)    GFR calc non Af Amer 46 (*)    GFR calc Af Amer 53 (*)    All other components within normal limits  CBC - Abnormal; Notable for the following components:   WBC 11.6 (*)    RBC 3.66 (*)    Hemoglobin 7.4 (*)    HCT 27.5 (*)    MCV 75.1 (*)    MCH 20.2 (*)    MCHC 26.9 (*)    RDW 19.5 (*)    Platelets 443 (*)    All other components within  normal limits  IRON AND TIBC - Abnormal; Notable for the following components:   Iron 18 (*)    TIBC 500 (*)    Saturation Ratios 4 (*)    All other components within normal limits  RETICULOCYTES - Abnormal; Notable for the following components:   RBC. 3.45 (*)    Immature Retic Fract 37.9 (*)    All other components within normal limits  SARS CORONAVIRUS 2 BY RT PCR (HOSPITAL ORDER, [facility])  VITAMIN B12  FOLATE  FERRITIN  CBC   BASIC METABOLIC PANEL  POC OCCULT BLOOD, ED  TYPE AND SCREEN  ABO/RH  TYPE AND SCREEN  PREPARE RBC (CROSSMATCH)    EKG EKG Interpretation  Date/Time:  Friday November 04 2019 20:06:03 EDT Ventricular Rate:  73 PR Interval:    QRS Duration: 84 QT Interval:  434 QTC Calculation: 479 R Axis:   48 Text Interpretation: Sinus or ectopic atrial rhythm Low voltage, precordial leads Since last tracing Atrial fibrillation NO LONGER PRESENT Confirmed by Calvert Cantor [phone]) on 11/04/2019 9:30:38 PM   Radiology DG Chest Portable 1 View  Result Date: 11/04/2019 CLINICAL DATA:  Shortness of breath. EXAM: PORTABLE CHEST 1 VIEW COMPARISON:  Radiograph 09/26/2019 FINDINGS: Mild cardiomegaly likely accentuated by portable AP technique. Aortic atherosclerosis. Stable chronic peribronchial thickening and interstitial prominence. No confluent airspace disease, pleural effusion, or pneumothorax. Remote right rib fractures again seen. No acute osseous abnormalities. IMPRESSION: Chronic bronchitic change and interstitial prominence. No superimposed acute abnormality. Electronically Signed   By: Keith Rake M.D.   On: 11/04/2019 19:05    Procedures Procedures (including critical care time)  Medications Ordered in ED Medications  bisoprolol (ZEBETA) tablet 10 mg (has no administration in time range)  dofetilide (TIKOSYN) capsule 500 mcg (has no administration in time range)  losartan (COZAAR) tablet 100 mg (has no administration in time range)  simvastatin (ZOCOR) tablet 20 mg (has no administration in time range)  glipiZIDE (GLUCOTROL) tablet 10 mg (has no administration in time range)  levothyroxine (SYNTHROID) tablet 112 mcg (has no administration in time range)  metFORMIN (GLUCOPHAGE-XR) 24 hr tablet 1,000 mg (has no administration in time range)  apixaban (ELIQUIS) tablet 5 mg (has no administration in time range)  melatonin tablet 3 mg (has no administration in time range)  Magnesium  TABS 250 mg (has no administration in time range)  sodium chloride flush (NS) 0.9 % injection 3 mL (has no administration in time range)  acetaminophen (TYLENOL) tablet 650 mg (has no administration in time range)    Or  acetaminophen (TYLENOL) suppository 650 mg (has no administration in time range)  ferumoxytol (FERAHEME) 510 mg in sodium chloride 0.9 % 100 mL IVPB (has no administration in time range)  0.9 %  sodium chloride infusion (Manually program via Guardrails IV Fluids) (has no administration in time range)    ED Course  I have reviewed the triage vital signs and the nursing notes.  Pertinent labs & imaging results that were available during my care of the patient were reviewed by me and considered in my medical decision making (see chart for details).  Clinical Course as of Nov 04 2151  Fri Nov 04, 2019  [place] [phone]). Dr. Quillian Quince states patient's hemoglobin was 12.5 in January.  Diagnosed with Afib 9-10 months ago, placed on Eliquis.   [SJ]  2048 Spoke with IM teaching service   [SJ]    Clinical Course User Index [SJ] Caryl Asp,  Helane Gunther, PA-C   MDM Rules/Calculators/A&P                          Patient presents with fatigue, shortness of breath, intermittent chest pain for the past 2 to 3 weeks.  She has been evaluated and worked up by the A. fib clinic from the A. fib angle, however, recently noted to be anemic.  Previous value of 12.5, 7.4 today. Patient is nontoxic appearing, afebrile, not tachycardic, not tachypneic, not hypotensive, maintains excellent SPO2 on room air, and is in no apparent distress.   I have reviewed the patient's chart to obtain more information.   I reviewed and interpreted the patient's labs and radiological studies.  Patient would likely need admission for further evaluation of her symptomatic anemia.  Findings and plan of care discussed with Calvert Cantor, MD.   Vitals:   11/04/19 1310 11/04/19 1351  11/04/19 1425 11/04/19 1458  BP: (!) 129/58 (!) 136/56 127/78 (!) 136/56  Pulse: 73 72 72 70  Resp: 20 20 16 16   Temp:  98.3 F (36.8 C) 98.3 F (36.8 C) 98.2 F (36.8 C)  TempSrc:  Oral Oral Oral  SpO2: 96% 96% 94% 94%    Orthostatic VS for the past 24 hrs:  BP- Lying Pulse- Lying BP- Sitting Pulse- Sitting BP- Standing at 0 minutes Pulse- Standing at 0 minutes  11/04/19 1642 132/45 72 145/58 70 157/63 73    Final Clinical Impression(s) / ED Diagnoses Final diagnoses:  Symptomatic anemia    Rx / DC Orders ED Discharge Orders    None       Layla Maw 11/04/19 2153    Truddie Hidden, MD 11/04/19 2238

## 2019-11-04 NOTE — ED Notes (Signed)
Pure wick has been placed. Suction set to 53mmhG

## 2019-11-04 NOTE — ED Notes (Signed)
Pts  Daughter wants a call when pt goes back to room.

## 2019-11-05 DIAGNOSIS — I10 Essential (primary) hypertension: Secondary | ICD-10-CM

## 2019-11-05 DIAGNOSIS — K219 Gastro-esophageal reflux disease without esophagitis: Secondary | ICD-10-CM | POA: Diagnosis not present

## 2019-11-05 DIAGNOSIS — G4733 Obstructive sleep apnea (adult) (pediatric): Secondary | ICD-10-CM

## 2019-11-05 DIAGNOSIS — I4819 Other persistent atrial fibrillation: Secondary | ICD-10-CM | POA: Diagnosis not present

## 2019-11-05 DIAGNOSIS — E039 Hypothyroidism, unspecified: Secondary | ICD-10-CM | POA: Diagnosis not present

## 2019-11-05 DIAGNOSIS — D649 Anemia, unspecified: Secondary | ICD-10-CM | POA: Diagnosis present

## 2019-11-05 DIAGNOSIS — D500 Iron deficiency anemia secondary to blood loss (chronic): Secondary | ICD-10-CM | POA: Diagnosis not present

## 2019-11-05 DIAGNOSIS — E119 Type 2 diabetes mellitus without complications: Secondary | ICD-10-CM | POA: Diagnosis not present

## 2019-11-05 DIAGNOSIS — D509 Iron deficiency anemia, unspecified: Secondary | ICD-10-CM | POA: Diagnosis present

## 2019-11-05 DIAGNOSIS — Z7901 Long term (current) use of anticoagulants: Secondary | ICD-10-CM

## 2019-11-05 LAB — HEMOGLOBIN AND HEMATOCRIT, BLOOD
HCT: 27.6 % — ABNORMAL LOW (ref 36.0–46.0)
Hemoglobin: 8 g/dL — ABNORMAL LOW (ref 12.0–15.0)

## 2019-11-05 LAB — URINALYSIS, ROUTINE W REFLEX MICROSCOPIC
Bilirubin Urine: NEGATIVE
Glucose, UA: NEGATIVE mg/dL
Hgb urine dipstick: NEGATIVE
Ketones, ur: NEGATIVE mg/dL
Leukocytes,Ua: NEGATIVE
Nitrite: NEGATIVE
Protein, ur: NEGATIVE mg/dL
Specific Gravity, Urine: 1.006 (ref 1.005–1.030)
pH: 7 (ref 5.0–8.0)

## 2019-11-05 LAB — CBC
HCT: 24.3 % — ABNORMAL LOW (ref 36.0–46.0)
Hemoglobin: 6.5 g/dL — CL (ref 12.0–15.0)
MCH: 19.6 pg — ABNORMAL LOW (ref 26.0–34.0)
MCHC: 26.7 g/dL — ABNORMAL LOW (ref 30.0–36.0)
MCV: 73.4 fL — ABNORMAL LOW (ref 80.0–100.0)
Platelets: 370 10*3/uL (ref 150–400)
RBC: 3.31 MIL/uL — ABNORMAL LOW (ref 3.87–5.11)
RDW: 19.2 % — ABNORMAL HIGH (ref 11.5–15.5)
WBC: 13.5 10*3/uL — ABNORMAL HIGH (ref 4.0–10.5)
nRBC: 0.2 % (ref 0.0–0.2)

## 2019-11-05 LAB — PREPARE RBC (CROSSMATCH)

## 2019-11-05 LAB — BASIC METABOLIC PANEL
Anion gap: 13 (ref 5–15)
BUN: 17 mg/dL (ref 8–23)
CO2: 21 mmol/L — ABNORMAL LOW (ref 22–32)
Calcium: 9.6 mg/dL (ref 8.9–10.3)
Chloride: 102 mmol/L (ref 98–111)
Creatinine, Ser: 1.09 mg/dL — ABNORMAL HIGH (ref 0.44–1.00)
GFR calc Af Amer: 55 mL/min — ABNORMAL LOW (ref >60)
GFR calc non Af Amer: 47 mL/min — ABNORMAL LOW (ref >60)
Glucose, Bld: 120 mg/dL — ABNORMAL HIGH (ref 70–99)
Potassium: 5 mmol/L (ref 3.5–5.1)
Sodium: 136 mmol/L (ref 135–145)

## 2019-11-05 LAB — TYPE AND SCREEN
ABO/RH(D): B POS
Antibody Screen: NEGATIVE

## 2019-11-05 LAB — MAGNESIUM: Magnesium: 2.2 mg/dL (ref 1.7–2.4)

## 2019-11-05 MED ORDER — POLYSACCHARIDE IRON COMPLEX 150 MG PO CAPS
150.0000 mg | ORAL_CAPSULE | Freq: Every day | ORAL | 0 refills | Status: DC
Start: 2019-11-05 — End: 2021-06-20

## 2019-11-05 NOTE — Discharge Summary (Signed)
Name: Miranda Wilkinson MRN: [id number] DOB: 05/25/1935 84 y.o. PCP: Caryl Bis, MD  Date of Admission: 11/04/2019 10:07 AM Date of Discharge: 11/05/19 Attending Physician: Lalla Brothers MD  Discharge Diagnosis: 1. Symptomatic Iron Deficiency Anemia  Discharge Medications: Allergies as of 11/05/2019   No Known Allergies     Medication List    TAKE these medications   acetaminophen 500 MG tablet Commonly known as: TYLENOL Take 500-1,000 mg by mouth every 6 (six) hours as needed (for pain.).   Anoro Ellipta 62.5-25 MCG/INH Aepb Generic drug: umeclidinium-vilanterol Only open the device one time and then take your two separate drags to be sure you get it all What changed:   how much to take  how to take this  when to take this  reasons to take this   bisoprolol 10 MG tablet Commonly known as: ZEBETA Take 1 tablet (10 mg total) by mouth daily.   cetirizine 10 MG tablet Commonly known as: ZYRTEC Take 10 mg by mouth daily as needed for allergies.   dofetilide 500 MCG capsule Commonly known as: TIKOSYN Take 1 capsule (500 mcg total) by mouth 2 (two) times daily. What changed: additional instructions   Eliquis 5 MG Tabs tablet Generic drug: apixaban TAKE 1 TABLET TWICE A DAY What changed:   how much to take  additional instructions   glipiZIDE 10 MG tablet Commonly known as: GLUCOTROL Take 10 mg by mouth daily.   iron polysaccharides 150 MG capsule Commonly known as: Nu-Iron Take 1 capsule (150 mg total) by mouth daily.   levothyroxine 112 MCG tablet Commonly known as: SYNTHROID Take 112 mcg by mouth daily.   losartan 100 MG tablet Commonly known as: COZAAR Take 1 tablet (100 mg total) by mouth daily.   Magnesium 250 MG Tabs Take 250 mg by mouth every other day. In the morning.   melatonin 5 MG Tabs Take 1.25-5 mg by mouth at bedtime as needed (sleep).   metFORMIN 500 MG 24 hr tablet Commonly known as: GLUCOPHAGE-XR Take 1,000 mg by mouth  daily with supper.   multivitamin with minerals tablet Take 1 tablet by mouth daily.   simvastatin 40 MG tablet Commonly known as: ZOCOR Take 20 mg by mouth daily with supper.   Vitamin D3 50 MCG (2000 UT) capsule Take 2,000 Units by mouth daily.      Disposition and follow-up:   [name] was discharged from [facility] in Stable condition.  At the hospital follow up visit please address:  1.  Iron Deficiency Anemia - No obvious source of bleed found on admission - At d/c hgb of 8 - Check cbc for stable hemoglobin - Ensure she continues her iron supplements - Recommend outpatient referral to GI for further work-up  2.  Labs / imaging needed at time of follow-up: cbc, bmp  3.  Pending labs/ test needing follow-up: N/A  Follow-up Appointments:  Follow-up Information    Caryl Bis, MD. Schedule an appointment as soon as possible for a visit.   Specialty: Family Medicine Contact information: [address] [place] [address] [phone]              Hospital Course by problem list:  1. Iron Deficiency Anemia: [name] is an 84 yo F w/ PMH of persistent A.fib on eliquis, HTN, Hypothyroidism, GERD, T2D, OSA presenting to [facility] w/ weakness. She was found to have new onset microcytic \ anemia with hemoglobin of 6.5. She was found  to have low iron and saturation with elevated TIBC and ferritin of 11. She was given 1 unit of pRBC and IV iron infusion. Her hemoglobin was observed overnight and improved to 8.0. She also had no episodes of ongoing bleed during hospitalization. She was discharged with recommendation to continue with oral iron supplementation and follow up with GI outpatient.  Discharge Vitals:   BP (!) 140/57 (BP Location: Left Arm)   Pulse 79   Temp 97.8 F (36.6 C) (Oral)   Resp 19   Ht 5\' 5"  (1.651 m)   Wt 103.9 kg   SpO2 96%   BMI 38.11 kg/m   Pertinent Labs, Studies, and Procedures:  CBC Latest Ref Rng & Units 11/05/2019  11/05/2019 11/04/2019  WBC 4.0 - 10.5 K/uL - 13.5(H) 11.6(H)  Hemoglobin 12.0 - 15.0 g/dL 8.0(L) 6.5(LL) 7.4(L)  Hematocrit 36 - 46 % 27.6(L) 24.3(L) 27.5(L)  Platelets 150 - 400 K/uL - 370 443(H)   BMP Latest Ref Rng & Units 11/05/2019 11/04/2019 11/03/2019  Glucose 70 - 99 mg/dL 120(H) 177(H) 214(H)  BUN 8 - 23 mg/dL 17 17 14   Creatinine 0.44 - 1.00 mg/dL 1.09(H) 1.12(H) 1.12(H)  Sodium 135 - 145 mmol/L 136 135 138  Potassium 3.5 - 5.1 mmol/L 5.0 4.6 5.2(H)  Chloride 98 - 111 mmol/L 102 100 102  CO2 22 - 32 mmol/L 21(L) 23 25  Calcium 8.9 - 10.3 mg/dL 9.6 9.6 10.0   PORTABLE CHEST 1 VIEW  FINDINGS: Mild cardiomegaly likely accentuated by portable AP technique. Aortic atherosclerosis. Stable chronic peribronchial thickening and interstitial prominence. No confluent airspace disease, pleural effusion, or pneumothorax. Remote right rib fractures again seen. No acute osseous abnormalities.  IMPRESSION: Chronic bronchitic change and interstitial prominence. No superimposed acute abnormality.  Urinalysis    Component Value Date/Time   COLORURINE YELLOW 11/05/2019 1135   APPEARANCEUR CLEAR 11/05/2019 1135   LABSPEC 1.006 11/05/2019 1135   PHURINE 7.0 11/05/2019 1135   GLUCOSEU NEGATIVE 11/05/2019 1135   [place] 11/05/2019 [place] 11/05/2019 1135   [place] 11/05/2019 1135   PROTEINUR NEGATIVE 11/05/2019 1135   NITRITE NEGATIVE 11/05/2019 [place] 11/05/2019 1135   Discharge Instructions: Discharge Instructions    Call MD for:  difficulty breathing, headache or visual disturbances   Complete by: As directed    Call MD for:  extreme fatigue   Complete by: As directed    Call MD for:  persistant dizziness or light-headedness   Complete by: As directed    Call MD for:  persistant nausea and vomiting   Complete by: As directed    Call MD for:  redness, tenderness, or signs of infection (pain, swelling, redness, odor or  green/yellow discharge around incision site)   Complete by: As directed    Diet - low sodium heart healthy   Complete by: As directed    Discharge instructions   Complete by: As directed    Dear Miranda Wilkinson  You came to [place] with weakness. We have determined this was caused by iron deficiency anemia. Here are our recommendations for you at discharge:  Please start Nu-Iron 150mg  daily Please follow up with your primary care provider for lab check and make sure to get referral for gastroenterology  Thank you for choosing [facility]   Increase activity slowly   Complete by: As directed      Signed: Mosetta Anis, MD 11/05/2019, 1:44 PM Pager: [phone] After 5pm on weekdays and  1pm on weekends: On Call Pager: [phone]

## 2019-11-05 NOTE — Care Management Obs Status (Signed)
[place] NOTIFICATION   Patient Details  Name: Miranda Wilkinson MRN: [id number] Date of Birth: 1936-02-16   Medicare Observation Status Notification Given:  Yes    Claudie Leach, RN 11/05/2019, 1:52 PM

## 2019-11-05 NOTE — Progress Notes (Signed)
ZI SEK is a 84 y.o. female patient admitted from ED awake, alert - oriented  X 4 - no acute distress noted.  VSS - Blood pressure 122/82, pulse 74, temperature 98.4 F (36.9 C), resp. rate 20, SpO2 98 %.    IV in place, occlusive dsg intact without redness.    Will cont to eval and treat per MD orders.  Vidal Schwalbe, RN 11/05/2019 12:36 AM

## 2019-11-05 NOTE — Progress Notes (Signed)
Spoke with Miranda Wilkinson's daughter, Miranda Wilkinson, regarding Miranda Wilkinson's clinical course with improvement in her hemoglobin and findings of iron deficiency. 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 Informed her of plan to discharge today.  Discussed importance of continuing iron supplementation at discharge and following up with GI for further work-up. Miranda Wilkinson expressed understanding and agrees with the plan.

## 2019-11-05 NOTE — Progress Notes (Signed)
CRITICAL VALUE ALERT  Critical Value:  Hgb=6.5   Date & Time Notied:  11/05/2019 0230  Provider Notified: Dr. Wynetta Emery   Orders Received/Actions taken: Transfuse 1 unit already ordered. Waiting for blood to be ready.

## 2019-11-05 NOTE — Care Management CC44 (Signed)
Condition Code 44 Documentation Completed  Patient Details  Name: MANDY PEEKS MRN: [id number] Date of Birth: March 27, 1935   Condition Code 44 given:  Yes Patient signature on Condition Code 44 notice:  Yes Documentation of 2 MD's agreement:  Yes Code 44 added to claim:  Yes    Claudie Leach, RN 11/05/2019, 1:52 PM

## 2019-11-05 NOTE — Evaluation (Signed)
Physical Therapy Evaluation Patient Details Name: Miranda Wilkinson MRN: [id number] DOB: [birth year] Today's Date: 11/05/2019   History of Present Illness  Pt is a 84 y.o. F with significant PMH of atrial ribrillation, HTN, hypothyroidism, Type 2 Diabetes Mellitus, OSA, who presents with symptomatic iron deficiency anemia. Plan for f/u outpatient GI for further work up.  Clinical Impression  Prior to admission, pt lives with her daughter and is independent. Pt daughter reports pt has been more sedentary recently in setting of persistent atrial fibrillation. On PT evaluation, pt presents fairly close to her functional baseline. Ambulating 100 feet with no assistive device without physical assist. SpO2 96% on RA, HR 82 bpm. Pt denying dizziness/lightheadedness. Discussed with pt/pt daughter endurance conservation strategies and activity progression. Pt/pt family with no further questions or concerns at this time. No further acute PT needs. Thank you for this consult.     Follow Up Recommendations No PT follow up    Equipment Recommendations  None recommended by PT    Recommendations for Other Services       Precautions / Restrictions Precautions Precautions: None Restrictions Weight Bearing Restrictions: No      Mobility  Bed Mobility Overal bed mobility: Modified Independent             General bed mobility comments: HOB slightly elevated  Transfers Overall transfer level: Independent Equipment used: None                Ambulation/Gait Ambulation/Gait assistance: Supervision Gait Distance (Feet): 100 Feet Assistive device: None Gait Pattern/deviations: Step-through pattern;Decreased stride length;Wide base of support Gait velocity: decreased   General Gait Details: No gross unsteadiness, supervision for safety  Stairs            Wheelchair Mobility    Modified Rankin (Stroke Patients Only)       Balance Overall balance assessment: Mild deficits  observed, not formally tested                                           Pertinent Vitals/Pain Pain Assessment: No/denies pain    Home Living Family/patient expects to be discharged to:: Private residence Living Arrangements: Children (daughter) Available Help at Discharge: Family Type of Home: House Home Access: Stairs to enter   Technical brewer of Steps: 2 Home Layout: One level Home Equipment: Environmental consultant - 2 wheels;Wheelchair - manual      Prior Function Level of Independence: Independent         Comments: Pt daughter reports pt more sedentary since November     Hand Dominance        Extremity/Trunk Assessment   Upper Extremity Assessment Upper Extremity Assessment: Overall WFL for tasks assessed    Lower Extremity Assessment Lower Extremity Assessment: Overall WFL for tasks assessed       Communication   Communication: HOH;Other (comment) (uses cochlear implants)  Cognition Arousal/Alertness: Awake/alert Behavior During Therapy: WFL for tasks assessed/performed Overall Cognitive Status: Within Functional Limits for tasks assessed                                        General Comments      Exercises     Assessment/Plan    PT Assessment Patent does not need any further PT services  PT Problem List  PT Treatment Interventions      PT Goals (Current goals can be found in the Care Plan section)  Acute Rehab PT Goals Patient Stated Goal: pt daughter would like her to be more active PT Goal Formulation: All assessment and education complete, DC therapy    Frequency     Barriers to discharge        Co-evaluation               AM-PAC PT "6 Clicks" Mobility  Outcome Measure Help needed turning from your back to your side while in a flat bed without using bedrails?: None Help needed moving from lying on your back to sitting on the side of a flat bed without using bedrails?: None Help needed  moving to and from a bed to a chair (including a wheelchair)?: None Help needed standing up from a chair using your arms (e.g., wheelchair or bedside chair)?: None Help needed to walk in hospital room?: None Help needed climbing 3-5 steps with a railing? : A Little 6 Click Score: 23    End of Session   Activity Tolerance: Patient tolerated treatment well Patient left: in bed;with call bell/phone within reach;with family/visitor present   PT Visit Diagnosis: Difficulty in walking, not elsewhere classified (R26.2)    Time: 1352-1410 PT Time Calculation (min) (ACUTE ONLY): 18 min   Charges:   PT Evaluation $PT Eval Low Complexity: [place], PT, DPT Acute Rehabilitation Services Pager [phone] Office [phone]   Deno Etienne 11/05/2019, 3:09 PM

## 2019-11-05 NOTE — Progress Notes (Signed)
   Subjective:  Miranda Wilkinson is a 84 y.o. with PMH of Persistent A.fib on chronic ac, htn, hypothyroidism, gerd, t2dm and osa admit for symptomatic anemia on hospital day 0  Miranda Wilkinson was examined and evaluated at bedside this am. She reports doing ok this morning. Reports not seeing any blood. Reports feeling alright, no lightheadedness, baseline SOB. Reports no change in color of urine or stool. Reports some dark stool about a month ago. Reports no feelings of bloating, tight fitting pants, no abdominal pain. Reports no vaginal bleeding.  Discussed needing to see GI once discharged from hospital.  Objective:  Vital signs in last 24 hours: Vitals:   11/05/19 0149 11/05/19 0352 11/05/19 0423 11/05/19 0423  BP:  (!) 113/37 104/60 104/60  Pulse: 74 74 72 72  Resp: 16 19 18 18   Temp:  99.7 F (37.6 C) 98.9 F (37.2 C) 98.9 F (37.2 C)  TempSrc:  Oral  Oral  SpO2: 92% 94%  94%  Weight:      Height:       Gen: Well-developed, well nourished, NAD HEENT: NCAT head, poor hearing, MMM Pulm: CTAB, no rales, no wheezes Extm: ROM intact, No peripheral edema, cap refill <2 sec Skin: Dry, Warm, + pallor, normal turgor Neuro: AAOx3, Answers questions appropriately Psych: Normal mood and affect   Assessment/Plan:  Active Problems:   Symptomatic anemia  Miranda Wilkinson is 84 yo F w/ PMH of PAF on Eliquis, HTn, Hypothyroidism, GERD, T2DM, Presbycusis, and OSA admit for symptomatic iron deficiency anemia likely due to occult GI bleed.  Subacute symptomatic iron deficiency anemia Present from a.fib clinic due to hgb of 7.4. Baseline hgb ~12-13. No episode of bleed on exam or history to suggest acute bleed. She is on eliquis for her A.fib which may be cause slow oozing bleed somewhere. Suspect possibly GI source as she mentions prior episodes of 'black' stools although has none at the moment and FOBT is negative. Will need to f/u outpatient GI for further work-up. Awaiting post-transfusion h&h. S/p 1  unit pRBC and Feraheme - F/u H&H - Monitor for bleed - Continue w/ oral iron repletion at discharge  Persistent Atrial Fibrillation CHADS-VASC2 score of 5. On eliquis at home. No current proven GI bleed so c/w anticoagulation. S/p cardioversions but converted back to A.fib. On Tikosyn and bisoprolol. Current HR 70s. BP stable. - Telemetry - C/w eliquis - C/w home meds: Tikosyn 543mcg BID, bisoprolol 10mg  daily  DVT prophx: eliquis Diet: HH Bowel: N/A Code: Full  Prior to Admission Living Arrangement: Home Anticipated Discharge Location: Home Barriers to Discharge: Medical treatment Dispo: Anticipated discharge in approximately 0-1 day(s).   Miranda Anis, MD 11/05/2019, 6:37 AM Pager: [phone] After 5pm on weekdays and 1pm on weekends: On Call Pager: [phone]

## 2019-11-05 NOTE — Discharge Instructions (Signed)
Anemia  Anemia is a condition in which you do not have enough red blood cells or hemoglobin. Hemoglobin is a substance in red blood cells that carries oxygen. When you do not have enough red blood cells or hemoglobin (are anemic), your body cannot get enough oxygen and your organs may not work properly. As a result, you may feel very tired or have other problems. What are the causes? Common causes of anemia include:  Excessive bleeding. Anemia can be caused by excessive bleeding inside or outside the body, including bleeding from the intestine or from periods in women.  Poor nutrition.  Long-lasting (chronic) kidney, thyroid, and liver disease.  Bone marrow disorders.  Cancer and treatments for cancer.  HIV (human immunodeficiency virus) and AIDS (acquired immunodeficiency syndrome).  Treatments for HIV and AIDS.  Spleen problems.  Blood disorders.  Infections, medicines, and autoimmune disorders that destroy red blood cells. What are the signs or symptoms? Symptoms of this condition include:  Minor weakness.  Dizziness.  Headache.  Feeling heartbeats that are irregular or faster than normal (palpitations).  Shortness of breath, especially with exercise.  Paleness.  Cold sensitivity.  Indigestion.  Nausea.  Difficulty sleeping.  Difficulty concentrating. Symptoms may occur suddenly or develop slowly. If your anemia is mild, you may not have symptoms. How is this diagnosed? This condition is diagnosed based on:  Blood tests.  Your medical history.  A physical exam.  Bone marrow biopsy. Your health care provider may also check your stool (feces) for blood and may do additional testing to look for the cause of your bleeding. You may also have other tests, including:  Imaging tests, such as a CT scan or MRI.  Endoscopy.  Colonoscopy. How is this treated? Treatment for this condition depends on the cause. If you continue to lose a lot of blood, you may  need to be treated at a hospital. Treatment may include:  Taking supplements of iron, vitamin M08, or folic acid.  Taking a hormone medicine (erythropoietin) that can help to stimulate red blood cell growth.  Having a blood transfusion. This may be needed if you lose a lot of blood.  Making changes to your diet.  Having surgery to remove your spleen. Follow these instructions at home:  Take over-the-counter and prescription medicines only as told by your health care provider.  Take supplements only as told by your health care provider.  Follow any diet instructions that you were given.  Keep all follow-up visits as told by your health care provider. This is important. Contact a health care provider if:  You develop new bleeding anywhere in the body. Get help right away if:  You are very weak.  You are short of breath.  You have pain in your abdomen or chest.  You are dizzy or feel faint.  You have trouble concentrating.  You have bloody or black, tarry stools.  You vomit repeatedly or you vomit up blood. Summary  Anemia is a condition in which you do not have enough red blood cells or enough of a substance in your red blood cells that carries oxygen (hemoglobin).  Symptoms may occur suddenly or develop slowly.  If your anemia is mild, you may not have symptoms.  This condition is diagnosed with blood tests as well as a medical history and physical exam. Other tests may be needed.  Treatment for this condition depends on the cause of the anemia. This information is not intended to replace advice given to you by  your health care provider. Make sure you discuss any questions you have with your health care provider. Document Revised: 02/06/2017 Document Reviewed: 03/28/2016 Elsevier Patient Education  2020 [employer]  

## 2019-11-06 LAB — TYPE AND SCREEN
ABO/RH(D): B POS
Antibody Screen: NEGATIVE
Unit division: 0

## 2019-11-06 LAB — BPAM RBC
Blood Product Expiration Date: 202109022359
ISSUE DATE / TIME: 202108280354
Unit Type and Rh: 1700

## 2019-11-07 ENCOUNTER — Other Ambulatory Visit (HOSPITAL_COMMUNITY): Payer: Medicare Other

## 2019-11-08 ENCOUNTER — Encounter: Payer: Self-pay | Admitting: Gastroenterology

## 2019-11-09 ENCOUNTER — Ambulatory Visit (HOSPITAL_COMMUNITY): Admit: 2019-11-09 | Payer: Medicare Other | Admitting: Internal Medicine

## 2019-11-09 ENCOUNTER — Encounter (HOSPITAL_COMMUNITY): Payer: Medicare Other

## 2019-11-09 SURGERY — CARDIOVERSION
Anesthesia: General

## 2019-11-10 ENCOUNTER — Ambulatory Visit: Payer: Medicare Other | Admitting: Nurse Practitioner

## 2019-11-14 NOTE — Progress Notes (Signed)
11/15/2019 Miranda Wilkinson [id number] 06-28-1935   CHIEF COMPLAINT: Iron deficiency anemia   HISTORY OF PRESENT ILLNESS:  Miranda Wilkinson is an 84 year old female with a past medical history of anxiety, hypertension, atrial fibrillation on Eliquis started on Tikosyn 10/24/2019 s/p failed cardioversion 10/27/2019, DM II, sleep apnea uses cpap, thyroid cancer s/p partial thyroidectomy 1975, hypothyroidism, diverticulosis and GERD.   She was referred to our office by Dr. Gar Ponto for further evaluation for iron deficiency anemia. She presented was evaluated at the atrial fibrillation clinic on 8/262021. Labs done showed Hg 7.3. She was contacted the next day and she was sent directly to the ED for further evaluation. Labs in the ED showed a  Hg level of  6.5. FOBT negative. She received 1 unit of PRBCs. Post transfusion Hg 8.0. She received Feraheme IV x 1.  She did not demonstrate any evidence of active GI bleeding. She denied having any CP or SOB. Eliquis was continued as she is high risk for stroke/thromboembolism.  She was discharged home 11/05/2019 with Iron Polysaccharide 150mg  daily to take indefinitely and to schedule a GI follow up appointment.   She presents accompanied by her daughter Miranda Wilkinson. The patient is hard of hearing and she is utilizing a microphone with a headset which facilitates effective communication. She reported passing a small black soft stool x 2 one day about 6 weeks ago. No associated Pepto bismol or iron use at that time. No further black stool. She typically passes a normal brown formed BM once daily. No rectal bleeding.  She reports having a history of colon polyps.  She is undergone 4 or 5 colonoscopies in her lifetime.  Her last colonoscopy was done 15 or 20 years ago at [facility].  No family history of colon cancer.  She has heartburn once or twice monthly and occasional stomach burning for which she takes Omeprazole 20 mg as needed.  No lower abdominal pain.   She has brief fleeting chest pain which lasts for 1 or 2 seconds once or twice weekly for the past 2 to 3 years which occurs randomly.  She was admitted to the hospital 10/24/2019 for Tikosyn load for atrial fibrillation s/p failed cardioversion on 10/27/2019. Plans for possible re-attempt cardioversion, if repeat cardioversion fails then possible load with Amiodarone or AV ablation. ECHO 02/2019 showed LVEF 70 -75%.    ECHO 02/24/2019: 1. Left ventricular ejection fraction, by visual estimation, is 70 to 75%. The left ventricle has hyperdynamic function. There is no left ventricular hypertrophy. 2. Left ventricular diastolic parameters are indeterminate. 3. The left ventricle has no regional wall motion abnormalities. 4. Global right ventricle has normal systolic function.The right ventricular size is normal. No increase in right ventricular wall thickness. 5. Left atrial size was normal. 6. Right atrial size was normal. 7. The mitral valve is normal in structure. Trivial mitral valve regurgitation. No evidence of mitral stenosis. 8. The tricuspid valve is normal in structure. Tricuspid valve regurgitation is not demonstrated. 9. The aortic valve has an indeterminant number of cusps. Aortic valve regurgitation is not visualized. Mild aortic valve sclerosis without stenosis. 10. The pulmonic valve was not well visualized. Pulmonic valve regurgitation is not visualized. 11. Normal pulmonary artery systolic pressure.  CBC Latest Ref Rng & Units 11/15/2019 11/05/2019 11/05/2019  WBC 4.0 - 10.5 K/uL 8.1 - 13.5(H)  Hemoglobin 12.0 - 15.0 g/dL 11.3(L) 8.0(L) 6.5(LL)  Hematocrit 36 - 46 % 36.9 27.6(L) 24.3(L)  Platelets 150 -  400 K/uL 263.0 - 370   CMP Latest Ref Rng & Units 11/15/2019 11/05/2019 11/04/2019  Glucose 70 - 99 mg/dL 215(H) 120(H) 177(H)  BUN 6 - 23 mg/dL 16 17 17   Creatinine 0.40 - 1.20 mg/dL 1.05 1.09(H) 1.12(H)  Sodium 135 - 145 mEq/L 134(L) 136 135  Potassium 3.5 - 5.1 mEq/L 4.4 5.0  4.6  Chloride 96 - 112 mEq/L 99 102 100  CO2 19 - 32 mEq/L 25 21(L) 23  Calcium 8.4 - 10.5 mg/dL 10.2 9.6 9.6  Total Protein 6.5 - 8.1 g/dL - - 7.2  Total Bilirubin 0.3 - 1.2 mg/dL - - 0.6  Alkaline Phos 38 - 126 U/L - - 72  AST 15 - 41 U/L - - 37  ALT 0 - 44 U/L - - 21    Past Medical History:  Diagnosis Date  . Anxiety   . Atrial fibrillation ([place])   . 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 Diabetes mellitus without complication ([place])   . GERD (gastroesophageal reflux disease)   . Headache(784.0)    hx migraines  . Hypertension   . Hypothyroidism   . Shortness of breath   . Sinus congestion   . Sleep apnea    study done in [place], [place], use CPAP   Past Surgical History:  Procedure Laterality Date  . APPENDECTOMY    . CARDIOVERSION N/A 10/27/2019   Procedure: CARDIOVERSION;  Surgeon: [name], MD;  Location[facility] ENDOSCOPY;  Service: Cardiovascular;  Laterality: N/A;  . CHOLECYSTECTOMY    . OPEN REDUCTION INTERNAL FIXATION (ORIF) TIBIA/FIBULA FRACTURE Right 11/03/2013   Procedure: OPEN REDUCTION INTERNAL FIXATION (ORIF) RIGHT TIBIA/FIBULA PILON FRACTURE;  Surgeon: Wylene Simmer, MD;  Location: [facility];  Service: Orthopedics;  Laterality: Right;  . OVARIAN CYST SURGERY Left    age 42  . THYROID EXPLORATION    . TUBAL LIGATION     Social History:  Past cigarette smoker. She quit smoking 8 years ago. No alcohol or drug use.   Family History: Brother with history of stomach cancer. Mother with diabetes, arrhythmia and stroke. Sister with uterine cancer and stroke. Brother with history of stomach cancer and stoke.   No Known Allergies    Outpatient Encounter Medications as of 11/15/2019  Medication Sig  . acetaminophen (TYLENOL) 500 MG tablet Take 500-1,000 mg by mouth every 6 (six) hours as needed (for pain.).  Marland Kitchen Ascorbic Acid (VITAMIN C) 1000 MG tablet Take 1,000 mg by mouth daily.  . bisoprolol (ZEBETA) 10 MG tablet Take 1 tablet (10 mg total) by mouth daily.    . cetirizine (ZYRTEC) 10 MG tablet Take 10 mg by mouth daily as needed for allergies.   . Cholecalciferol (VITAMIN D3) 50 MCG (2000 UT) capsule Take 2,000 Units by mouth daily.  Marland Kitchen dofetilide (TIKOSYN) 500 MCG capsule Take 1 capsule (500 mcg total) by mouth 2 (two) times daily. (Patient taking differently: Take 500 mcg by mouth 2 (two) times daily. (0900 & 2100))  . ELIQUIS 5 MG TABS tablet TAKE 1 TABLET TWICE A DAY (Patient taking differently: Take 5 mg by mouth 2 (two) times daily. (0900 & 2100))  . glipiZIDE (GLUCOTROL) 10 MG tablet Take 10 mg by mouth daily.   . iron polysaccharides (NU-IRON) 150 MG capsule Take 1 capsule (150 mg total) by mouth daily.  Marland Kitchen levothyroxine (SYNTHROID) 112 MCG tablet Take 112 mcg by mouth daily.   Marland Kitchen losartan (COZAAR) 100 MG tablet Take 1 tablet (100 mg total)  by mouth daily.  . Magnesium 250 MG TABS Take 250 mg by mouth every other day. In the morning.  . melatonin 5 MG TABS Take 1.25-5 mg by mouth at bedtime as needed (sleep).   . metFORMIN (GLUCOPHAGE-XR) 500 MG 24 hr tablet Take 1,000 mg by mouth daily with supper.  . Multiple Vitamins-Minerals (MULTIVITAMIN WITH MINERALS) tablet Take 1 tablet by mouth daily.  . simvastatin (ZOCOR) 40 MG tablet Take 20 mg by mouth daily with supper.   . umeclidinium-vilanterol (ANORO ELLIPTA) 62.5-25 MCG/INH AEPB Only open the device one time and then take your two separate drags to be sure you get it all (Patient taking differently: Inhale 2 puffs into the lungs daily as needed (respiratory issues.). Only open the device one time and then take your two separate drags to be sure you get it all)   No facility-administered encounter medications on file as of 11/15/2019.     REVIEW OF SYSTEMS:  Gen: Denies fever, sweats or chills. No weight loss.  CV: See HPI.  Resp: Denies cough, shortness of breath of hemoptysis.  GI: See HPI. GU : Denies urinary burning, blood in urine, increased urinary frequency or incontinence. MS:  Denies joint pain, muscles aches or weakness. Derm: Denies rash, itchiness, skin lesions or unhealing ulcers. Psych: Denies depression, anxiety or confusion.  Heme: Denies bruising, bleeding. Neuro:  Denies headaches, dizziness or paresthesias. Endo:  + DM II and hypothyroidism.    PHYSICAL EXAM: BP 110/66   Pulse 60   Ht 5\' 5"  (1.651 m)   Wt 228 lb 3.2 oz (103.5 kg)   BMI 37.97 kg/m    Wt Readings from Last 3 Encounters:  11/15/19 228 lb 3.2 oz (103.5 kg)  11/05/19 229 lb (103.9 kg)  11/03/19 229 lb 9.6 oz (104.1 kg)    General: Well developed 84 year old female presents in a wheel chair in NAD.  Head: Normocephalic and atraumatic. Eyes:  Sclerae non-icteric, conjunctive pink. Ears: Normal auditory acuity. Mouth: Dentition intact. No ulcers or lesions.  Neck: Supple, no lymphadenopathy or thyromegaly.  Lungs: Clear bilaterally to auscultation without wheezes, crackles or rhonchi. Heart: Irregular rhythm. No murmur, rub or gallop appreciated.  Abdomen: Soft, nontender, non distended. No masses. No hepatosplenomegaly. Normoactive bowel sounds x 4 quadrants. RUQ quad.  Rectal: Deferred.  Musculoskeletal: Symmetrical with no gross deformities. Skin: Warm and dry. No rash or lesions on visible extremities. Extremities: No edema. Neurological: Alert oriented x 4, no focal deficits.  Psychological:  Alert and cooperative. Normal mood and affect.  ASSESSMENT AND PLAN:  39. 84 year old female with iron deficiency anemia. FOBT negative. Admitted to the hospital 8/26 with Hg 6.5. FOBT. Transfused with 1 unit of PRBCs. Hg -> 8. Received IV Feraheme x 1. Melenic stool x 2 episodes 6 weeks ago without recurrence.  -CBC, iron, iron saturation, TIBC, ferritin and BMP -Continue Iron  Polysaccharide 150mg  one po QD  -Endoscopic evaluation deferred for now. Cardiac clearance required prior to scheduling eventual  EGD/colonoscopy.  -Patient to call our office if black stools recur  -Patient  to present to the ED if she develops CP, SOB, dizziness or profound fatigue.   2. Atrial fibrillation on Eliquis CHADS2VASC score of 5. She was admitted to the hospital 10/24/2019 for Tikosyn load for atrial fibrillation s/p failed cardioversion on 10/27/2019. Plans for possible re-attempt cardioversion, if repeat cardioversion fails then possible load with  Amiodarone or AV ablation. On exam today, the patient's heart rhythm was irregular,  rate controlled at 60 b/m and BP 110/66. No CP or SOB.  -Follow up with cardiology   3. GERD -Omeprazole 20mg  QD recommended for now, not PRN   3. History of colon polyps  -See plan in # 1  4. Sleep Apnea uses CPAP  5. DM II      CC:  Caryl Bis, MD

## 2019-11-15 ENCOUNTER — Telehealth: Payer: Self-pay | Admitting: General Surgery

## 2019-11-15 ENCOUNTER — Ambulatory Visit (INDEPENDENT_AMBULATORY_CARE_PROVIDER_SITE_OTHER): Payer: Medicare Other | Admitting: Nurse Practitioner

## 2019-11-15 ENCOUNTER — Encounter: Payer: Self-pay | Admitting: Nurse Practitioner

## 2019-11-15 ENCOUNTER — Other Ambulatory Visit (INDEPENDENT_AMBULATORY_CARE_PROVIDER_SITE_OTHER): Payer: Medicare Other

## 2019-11-15 VITALS — BP 110/66 | HR 60 | Ht 65.0 in | Wt 228.2 lb

## 2019-11-15 DIAGNOSIS — D509 Iron deficiency anemia, unspecified: Secondary | ICD-10-CM

## 2019-11-15 DIAGNOSIS — I4891 Unspecified atrial fibrillation: Secondary | ICD-10-CM | POA: Diagnosis not present

## 2019-11-15 DIAGNOSIS — Z86010 Personal history of colonic polyps: Secondary | ICD-10-CM | POA: Diagnosis not present

## 2019-11-15 LAB — BASIC METABOLIC PANEL
BUN: 16 mg/dL (ref 6–23)
CO2: 25 mEq/L (ref 19–32)
Calcium: 10.2 mg/dL (ref 8.4–10.5)
Chloride: 99 mEq/L (ref 96–112)
Creatinine, Ser: 1.05 mg/dL (ref 0.40–1.20)
GFR: 50.12 mL/min — ABNORMAL LOW (ref >60.00)
Glucose, Bld: 215 mg/dL — ABNORMAL HIGH (ref 70–99)
Potassium: 4.4 mEq/L (ref 3.5–5.1)
Sodium: 134 mEq/L — ABNORMAL LOW (ref 135–145)

## 2019-11-15 LAB — CBC
HCT: 36.9 % (ref 36.0–46.0)
Hemoglobin: 11.3 g/dL — ABNORMAL LOW (ref 12.0–15.0)
MCHC: 30.6 g/dL (ref 30.0–36.0)
MCV: 80.2 fl (ref 78.0–100.0)
Platelets: 263 10*3/uL (ref 150.0–400.0)
RBC: 4.61 Mil/uL (ref 3.87–5.11)
RDW: 33.4 % — ABNORMAL HIGH (ref 11.5–15.5)
WBC: 8.1 10*3/uL (ref 4.0–10.5)

## 2019-11-15 LAB — FERRITIN: Ferritin: 111 ng/mL (ref 10.0–291.0)

## 2019-11-15 NOTE — Telephone Encounter (Signed)
[facility] Pre-operative Risk Assessment     Request for surgical clearance:     Endoscopy Procedure  What type of surgery is being performed?     Colonoscopy/endoscopy  When is this surgery scheduled?     Will schedule when clearance is obtained, the patient needs to be cleared for Eliquis and A-fib  What type of clearance is required ?   Pharmacy and Medical   Are there any medications that need to be held prior to surgery and how long? Eliquis  Practice name and name of physician performing surgery?      [facility]  What is your office phone and fax number?      Phone- [phone]  [phone]  Anesthesia type (None, local, MAC, general) ?       MAC

## 2019-11-15 NOTE — Telephone Encounter (Signed)
Patient with diagnosis of A Fib on Eliquis for anticoagulation.    Procedure:  Colonoscopy/endoscopy Date of procedure: Will schedule when clearance is obtained, the patient needs to be cleared for Eliquis and A-fib  CHADS2-VASc score of  5 (HTN, AGE x 2, DM2, female)  CrCl 49 mL/min using adjusted body weight Platelet count 263K  Per office protocol, patient can hold Eliquis for 2 days prior to procedure.    If not bridging, patient should restart Eliquis on the evening of procedure or day after, at discretion of procedure MD  For orthopedic procedures please be sure to resume therapeutic (not prophylactic) dosing.

## 2019-11-15 NOTE — Patient Instructions (Addendum)
If you are age 82 or older, your body mass index should be between 23-30. Your Body mass index is 37.97 kg/m. If this is out of the aforementioned range listed, please consider follow up with your Primary Care Provider.  If you are age 79 or younger, your body mass index should be between 19-25. Your Body mass index is 37.97 kg/m. If this is out of the aformentioned range listed, please consider follow up with your Primary Care Provider.   ______________________________________________________________________________________________________  Your provider has requested that you go to the basement level for lab work before leaving today. Press "B" on the elevator. The lab is located at the first door on the left as you exit the elevator.  ___________________________________________________________________________________________________  1. We will send for Cardiac clearance prior to scheduling an EGD/Colonoscopy. 2. Continue taking your Iron supplement 1 time daily. 3. Please go to the Emergency Department if you develop. Shortness if breath, chest pain, and dizziness. 4. Call our office if you have a recurrence of black stools.  ___________________________________________________________________________________________________ Due to recent changes in healthcare laws, you may see the results of your imaging and laboratory studies on MyChart before your provider has had a chance to review them.  We understand that in some cases there may be results that are confusing or concerning to you. Not all laboratory results come back in the same time frame and the provider may be waiting for multiple results in order to interpret others.  Please give [place] 48 hours in order for your provider to thoroughly review all the results before contacting the office for clarification of your results.  ______________________________________________________________________________________________________ Thank you for  choosing [facility] Noralyn Pick, CRNP

## 2019-11-15 NOTE — Telephone Encounter (Signed)
Patient's daughter is returning call.

## 2019-11-15 NOTE — Telephone Encounter (Signed)
Left message for the patient to call back.  Kerin Ransom PA-C 11/15/2019 3:33 PM

## 2019-11-15 NOTE — Telephone Encounter (Signed)
Pharmacy please comment on anticoagulation and then I will contact the patient for clearance.  Kerin Ransom PA-C 11/15/2019 1:46 PM

## 2019-11-16 ENCOUNTER — Ambulatory Visit (HOSPITAL_COMMUNITY): Payer: Medicare Other | Admitting: Physician Assistant

## 2019-11-16 LAB — IRON, TOTAL/TOTAL IRON BINDING CAP
%SAT: 55 % (calc) — ABNORMAL HIGH (ref 16–45)
Iron: 216 ug/dL — ABNORMAL HIGH (ref 45–160)
TIBC: 390 mcg/dL (calc) (ref 250–450)

## 2019-11-16 NOTE — Progress Notes (Signed)
Noted, I will contact the patient within 2 weeks to verify her cardiac status prior to scheduling EGD/colonoscopy.

## 2019-11-16 NOTE — Progress Notes (Signed)
____________________________________________________________  Attending physician addendum:  Thank you for sending this case to me. I have reviewed the entire note, and the outlined plan seems appropriate.  Considering her age, comorbidities and degree of IDA, it is surprising that a GI consult was not requested during hospital admission.  I agree she needs an EGD and colonoscopy scheduled as soon as we hear from cardiology re: brief hold of Eliquis and stability of Afib. Please be sure to follow up on that within the next 2 weeks.  Wilfrid Lund, MD  ____________________________________________________________

## 2019-11-16 NOTE — Telephone Encounter (Signed)
I called back-left a message.  Kerin Ransom PA-C 11/16/2019 10:13 AM

## 2019-11-18 NOTE — Telephone Encounter (Signed)
Patient's daughter returned call.

## 2019-11-18 NOTE — Telephone Encounter (Signed)
Daughter checking on status of correcpondance with cardio.  Call daughter Benjaman Pott at [phone]

## 2019-11-21 ENCOUNTER — Telehealth: Payer: Self-pay | Admitting: General Surgery

## 2019-11-21 NOTE — Progress Notes (Signed)
Understood - thanks for attending to that.  - HD

## 2019-11-21 NOTE — Telephone Encounter (Signed)
-----   Message from Noralyn Pick, NP sent at 11/19/2019  2:47 PM EDT ----- Olivia Mackie pls contact the patient and let her know her anemia is improving. Her iron levels are no longer low. Please have her reduce her iron polysaccharide 150mg  tab to once every other day and repeat a CBC, iron, iron saturation, TIBC and ferritin level in 4 weeks.   Her glucose level is high and she will need to follow up with her PCP regarding this test result.   Please check on status of cardiac clearance, once received then EGD and colonoscopy to be ordered. Thank you for keeping me updated.

## 2019-11-21 NOTE — Telephone Encounter (Signed)
Called the patients daughter [name], okay to schedule for colonoscopy and egd, Per christopher Pavero, RPH  Okay to DC eligwuis for 2 days prior to procedure and  Restart the evening of procedure.

## 2019-11-21 NOTE — Telephone Encounter (Signed)
Error

## 2019-11-23 ENCOUNTER — Other Ambulatory Visit: Payer: Self-pay

## 2019-11-23 ENCOUNTER — Ambulatory Visit (AMBULATORY_SURGERY_CENTER): Payer: Self-pay

## 2019-11-23 VITALS — Ht 65.0 in | Wt 228.0 lb

## 2019-11-23 DIAGNOSIS — Z86010 Personal history of colonic polyps: Secondary | ICD-10-CM

## 2019-11-23 DIAGNOSIS — D509 Iron deficiency anemia, unspecified: Secondary | ICD-10-CM

## 2019-11-23 NOTE — Progress Notes (Signed)
No egg or soy allergy known to patient  No issues with past sedation with any surgeries or procedures No intubation problems in the past  No FH of Malignant Hyperthermia No diet pills per patient No home 02 use per patient  No blood thinners per patient  Pt denies issues with constipation  01/2019 - dx with AFIB (on Elquis) - instructed to hold med x 2 days prior to procedure  EMMI video to pt or via [place] 19 guidelines implemented in [place] today with Pt and RN  COVID vaccines completed on 04/2019 per pt;  Due to the COVID-19 pandemic we are asking patients to follow these guidelines. Please only bring one care partner. Please be aware that your care partner may wait in the car in the parking lot or if they feel like they will be too hot to wait in the car, they may wait in the lobby on the 4th floor. All care partners are required to wear a mask the entire time (we do not have any that we can provide them), they need to practice social distancing, and we will do a Covid check for all patient's and care partners when you arrive. Also we will check their temperature and your temperature. If the care partner waits in their car they need to stay in the parking lot the entire time and we will call them on their cell phone when the patient is ready for discharge so they can bring the car to the front of the building. Also all patient's will need to wear a mask into building.

## 2019-11-23 NOTE — Telephone Encounter (Signed)
Left a voicemail for the patients daughter [name] to call regarding a cardiac clearance for her mother.

## 2019-11-23 NOTE — Telephone Encounter (Signed)
Patient and patient's daughter arrived for pre visit today- pre visit completed, however, upon further review of chart- cardiac clearance has not been obtained; PROCEDURE has NOT been cancelled per patient's daughter's request -------   RN spoke with patient's daughter - Debroah Baller- verified DPR- Debroah Baller reports the patient has not been seen by a cardiologist- patient is only being seen/tx'd by AFIB clinic at [facility]-- Elquis hold was given however, cardiac clearance was not given;   Colleen-Rene does not want this procedure to be cancelled due to confusion on the clearance of medical vs medication, she is requesting for the procedure to stay on the schedule for 12/20/2019 for Dr. Tedd Sias -please review chart for [facility] clearance as patient has recently had a cardioversion and is on Tikosyn and was also seen in the ER for AFIB;

## 2019-11-23 NOTE — Telephone Encounter (Signed)
Noted, I appreciate everyone's effort to ensue the patient has a safe procedure as cardiac clearance was requested at the time of her office visit. EGD and colonoscopy were to be scheduled after cardiac clearance was obtained.

## 2019-11-23 NOTE — Telephone Encounter (Signed)
Renee, the patients daughter called and stated she spoke with Vickie at Dr Branches office and is waiting to here back from there office regarding cardiac clearance and how they are going to go about giving it to her, wether it be an office visit or by phone.

## 2019-11-23 NOTE — Telephone Encounter (Signed)
Thank you for the note.  I agree the patient needs to be seen in cardiology office follow up after recent cardioversion to ensure stability of A fib prior to undergoing propofol sedation and endoscopic procedures.  ________________  Cardiology Colleagues,  We greatly appreciate your attention to the question of pre-procedure hold of Eliquis.  Please arrange an office visit with you for this patient within the next 2-3 weeks to re-evaluate A fib and give an opinion on its stability as we plan endoscopic procedures.  Your office note from that visit (routed to me) would also be greatly appreciated.  Apologies for any miscommunication on our prior request for cardiology input on this patient.  -  - Wilfrid Lund, MD    [facility]

## 2019-11-24 ENCOUNTER — Telehealth: Payer: Self-pay | Admitting: General Surgery

## 2019-11-24 NOTE — Telephone Encounter (Signed)
Sent cardiac clearance to Dr Osker Mason branch Melody Comas [phone]

## 2019-11-24 NOTE — Telephone Encounter (Signed)
Can double book the 820 slot on Sept 21 as an in clinic appt   Zandra Abts MD

## 2019-11-24 NOTE — Telephone Encounter (Signed)
   Primary Cardiologist: Carlyle Dolly, MD  Chart reviewed as part of pre-operative protocol coverage.   This appears to be a duplicate request - see telephone note 11/15/19 - though it does appear the preoperative assessment was incomplete. There were several back and forth attempts to connect with the patient, however no one has spoken with her to finalize her preop status.   Per pharmacy's previous recommendations: Patient with diagnosis of A Fib on Eliquis for anticoagulation.    Procedure: Colonoscopy/endoscopy Date of procedure: Will schedule when clearance is obtained, the patient needs to be cleared for Eliquis and A-fib  CHADS2-VASc score of  5 (HTN, AGE x 2, DM2, female)  CrCl 49 mL/min using adjusted body weight Platelet count 263K  Per office protocol, patient can hold Eliquis for 2 days prior to procedure.    If not bridging, patient should restart Eliquis on the evening of procedure or day after, at discretion of procedure MD   On further review, it appears Dr. Harl Bowie is planning to see her in office 11/29/19 for preoperative assessment.   I will remove from the preop pool at this time.    Abigail Butts, PA-C 11/24/2019, 4:52 PM

## 2019-11-24 NOTE — Telephone Encounter (Signed)
[facility] Pre-operative Risk Assessment     Request for surgical clearance:     Endoscopy Procedure  What type of surgery is being performed?   Endoscopy/Colonoscopy  When is this surgery scheduled?    12/20/2019  What type of clearance is required ?   Cardiac Clearance  Are there any medications that need to be held prior to surgery and how long? Eliquis  Practice name and name of physician performing surgery?      [facility]  What is your office phone and fax number?      Phone- [phone]  [phone]  Anesthesia type (None, local, MAC, general) ?       MAC

## 2019-11-24 NOTE — Telephone Encounter (Signed)
Miranda Wilkinson from [facility] in [place] is requesting a cardiology clearance request be sent over to them since the pt is going to have a colonoscopy.  [phone]

## 2019-11-25 ENCOUNTER — Ambulatory Visit: Payer: Medicare Other | Admitting: Gastroenterology

## 2019-11-29 ENCOUNTER — Encounter: Payer: Self-pay | Admitting: Cardiology

## 2019-11-29 ENCOUNTER — Other Ambulatory Visit: Payer: Self-pay

## 2019-11-29 ENCOUNTER — Ambulatory Visit (INDEPENDENT_AMBULATORY_CARE_PROVIDER_SITE_OTHER): Payer: Medicare Other | Admitting: Cardiology

## 2019-11-29 VITALS — BP 138/78 | HR 62 | Wt 230.0 lb

## 2019-11-29 DIAGNOSIS — I4891 Unspecified atrial fibrillation: Secondary | ICD-10-CM

## 2019-11-29 DIAGNOSIS — Z01810 Encounter for preprocedural cardiovascular examination: Secondary | ICD-10-CM

## 2019-11-29 NOTE — Patient Instructions (Signed)
Medication Instructions:  Your physician recommends that you continue on your current medications as directed. Please refer to the Current Medication list given to you today.  *If you need a refill on your cardiac medications before your next appointment, please call your pharmacy*  Follow-Up: At [facility], you and your health needs are our priority.  As part of our continuing mission to provide you with exceptional heart care, we have created designated Provider Care Teams.  These Care Teams include your primary Cardiologist (physician) and Advanced Practice Providers (APPs -  Physician Assistants and Nurse Practitioners) who all work together to provide you with the care you need, when you need it.  We recommend signing up for the patient portal called "MyChart".  Sign up information is provided on this After Visit Summary.  MyChart is used to connect with patients for Virtual Visits (Telemedicine).  Patients are able to view lab/test results, encounter notes, upcoming appointments, etc.  Non-urgent messages can be sent to your provider as well.   To learn more about what you can do with MyChart, go to [email or web address].    Your next appointment:   3 month(s)  The format for your next appointment:   In Person  Provider:   Carlyle Dolly, MD        Thank you for choosing [facility] !

## 2019-11-29 NOTE — Progress Notes (Signed)
Clinical Summary [name] is a 84 y.o.female seen today for follow up of the following medical problems.  1. PAF - afib noted by 01/26/2019 preop ekg - no recent palpitations. Has intermittent SOB/DOE at times, some days worst then other.  - recent monitor showed rate controlled afib/aflutter  - issues with afib with RVR during 10/2019 clinic visit, referred to [place] [facility] - admitted 10/24/19 for tikosyn initiation. Loaded with attempted cardioversion 10/27/19 but failed - at EP f/u 8/26 plans were for repeat cardioversion, if fails would consider amiodarone. Appears admitted with anemia prior to cardioversion, EKG during that admission 11/04/19 showed NSR - dilt stopped 10/27/19 after tikosyn loading, remained on bisoprolol  - needs clearance for colonscopy/endoscopy for anemia - no recent symptoms     2. Anemia - admitted 10/2019 with symptomatic anemia. Hgb down to 6.5, given 1 unit prbcs   Past Medical History:  Diagnosis Date  . Allergy    seasonal allergies  . Anemia    IDA- on iron supplement  . Anxiety   . Atrial fibrillation ([place])   . Blood transfusion without reported diagnosis    2021- due to IDA/ low hemo (7.3)  . Cancer ([place]) 1976   bilaterl thyroid ca  . Diabetes mellitus without complication ([place])    on meds  . GERD (gastroesophageal reflux disease)    on meds---OTC  . Headache(784.0)    hx migraines  . Hyperlipidemia    on meds  . Hypertension    on meds  . Hypothyroidism    thyroidectomy in 1976- on meds at  this time  . Shortness of breath   . Sinus congestion   . Sleep apnea    study done in [place], [place], use CPAP----with home O2 concentrater at night with CPAP     No Known Allergies   Current Outpatient Medications  Medication Sig Dispense Refill  . acetaminophen (TYLENOL) 500 MG tablet Take 500-1,000 mg by mouth every 6 (six) hours as needed (for pain.).    Marland Kitchen bisoprolol (ZEBETA) 10 MG tablet Take 1 tablet (10 mg total) by mouth  daily. 30 tablet 6  . cetirizine (ZYRTEC) 10 MG tablet Take 10 mg by mouth daily as needed for allergies.     . Cholecalciferol (VITAMIN D3) 50 MCG (2000 UT) capsule Take 2,000 Units by mouth daily.    Marland Kitchen dofetilide (TIKOSYN) 500 MCG capsule Take 1 capsule (500 mcg total) by mouth 2 (two) times daily. (Patient taking differently: Take 500 mcg by mouth 2 (two) times daily. (0900 & 2100)) 60 capsule 2  . ELIQUIS 5 MG TABS tablet TAKE 1 TABLET TWICE A DAY (Patient taking differently: Take 5 mg by mouth 2 (two) times daily. (0900 & 2100)) 180 tablet 3  . glipiZIDE (GLUCOTROL) 10 MG tablet Take 10 mg by mouth daily.     . iron polysaccharides (NU-IRON) 150 MG capsule Take 1 capsule (150 mg total) by mouth daily. 30 capsule 0  . levothyroxine (SYNTHROID) 112 MCG tablet Take 112 mcg by mouth daily.     Marland Kitchen losartan (COZAAR) 100 MG tablet Take 1 tablet (100 mg total) by mouth daily. 90 tablet 3  . Magnesium 250 MG TABS Take 250 mg by mouth every other day. In the morning.    . melatonin 5 MG TABS Take 1.25-5 mg by mouth at bedtime as needed (sleep).     . metFORMIN (GLUCOPHAGE-XR) 500 MG 24 hr tablet Take 1,000 mg by mouth daily with supper.    Marland Kitchen  Multiple Vitamins-Minerals (MULTIVITAMIN WITH MINERALS) tablet Take 1 tablet by mouth daily.    Marland Kitchen POTASSIUM GLUCONATE PO Take 595 mg by mouth every other day.    . simvastatin (ZOCOR) 40 MG tablet Take 20 mg by mouth daily with supper.     . Thiamine HCl (VITAMIN B-1 PO) Take by mouth.    . umeclidinium-vilanterol (ANORO ELLIPTA) 62.5-25 MCG/INH AEPB Only open the device one time and then take your two separate drags to be sure you get it all (Patient taking differently: Inhale 2 puffs into the lungs daily as needed (respiratory issues.). Only open the device one time and then take your two separate drags to be sure you get it all) 1 each 11   No current facility-administered medications for this visit.     Past Surgical History:  Procedure Laterality Date  .  APPENDECTOMY    . CARDIOVERSION N/A 10/27/2019   Procedure: CARDIOVERSION;  Surgeon: [name], MD;  Location: [facility] ENDOSCOPY;  Service: Cardiovascular;  Laterality: N/A;  . CHOLECYSTECTOMY    . OPEN REDUCTION INTERNAL FIXATION (ORIF) TIBIA/FIBULA FRACTURE Right 11/03/2013   Procedure: OPEN REDUCTION INTERNAL FIXATION (ORIF) RIGHT TIBIA/FIBULA PILON FRACTURE;  Surgeon: Wylene Simmer, MD;  Location: [facility];  Service: Orthopedics;  Laterality: Right;  . OVARIAN CYST SURGERY Left    age 37  . THYROIDECTOMY Bilateral 1976  . TUBAL LIGATION       No Known Allergies    Family History  Problem Relation Age of Onset  . Stroke Mother        brain hemmorhage  . Diabetes Mother   . Heart disease Mother   . Thyroid disease Mother   . Arrhythmia Mother   . Stroke Father   . Heart attack Father        blood clot in brain  . Stroke Sister   . Cancer Sister   . Stomach cancer Brother 55  . Hypertension Brother   . Esophageal cancer Brother 107  . Stroke Brother   . Hypertension Brother   . Arrhythmia Sister   . Hypertension Sister   . Colon polyps Neg Hx   . Colon cancer Neg Hx   . Rectal cancer Neg Hx      Social History [name] reports that she quit smoking about 8 years ago. Her smoking use included cigarettes. She has a 90.00 pack-year smoking history. She has never used smokeless tobacco. [name] reports no history of alcohol use.   Review of Systems CONSTITUTIONAL: No weight loss, fever, chills, weakness or fatigue.  HEENT: Eyes: No visual loss, blurred vision, double vision or yellow sclerae.No hearing loss, sneezing, congestion, runny nose or sore throat.  SKIN: No rash or itching.  CARDIOVASCULAR: per hpi RESPIRATORY: No shortness of breath, cough or sputum.  GASTROINTESTINAL: No anorexia, nausea, vomiting or diarrhea. No abdominal pain or blood.  GENITOURINARY: No burning on urination, no polyuria NEUROLOGICAL: No headache, dizziness, syncope, paralysis,  ataxia, numbness or tingling in the extremities. No change in bowel or bladder control.  MUSCULOSKELETAL: No muscle, back pain, joint pain or stiffness.  LYMPHATICS: No enlarged nodes. No history of splenectomy.  PSYCHIATRIC: No history of depression or anxiety.  ENDOCRINOLOGIC: No reports of sweating, cold or heat intolerance. No polyuria or polydipsia.  Marland Kitchen   Physical Examination Today's Vitals   11/29/19 0806  BP: 138/78  Pulse: 62  SpO2: 97%  Weight: 230 lb (104.3 kg)   Body mass index is 38.27 kg/m.  Gen: resting  comfortably, no acute distress HEENT: no scleral icterus, pupils equal round and reactive, no palptable cervical adenopathy,  CV: RRR, no m/r/g, no jvd Resp: Clear to auscultation bilaterally GI: abdomen is soft, non-tender, non-distended, normal bowel sounds, no hepatosplenomegaly MSK: extremities are warm, no edema.  Skin: warm, no rash Neuro:  no focal deficits Psych: appropriate affect   Diagnostic Studies 02/2019 echo IMPRESSIONS    1. Left ventricular ejection fraction, by visual estimation, is 70 to  75%. The left ventricle has hyperdynamic function. There is no left  ventricular hypertrophy.  2. Left ventricular diastolic parameters are indeterminate.  3. The left ventricle has no regional wall motion abnormalities.  4. Global right ventricle has normal systolic function.The right  ventricular size is normal. No increase in right ventricular wall  thickness.  5. Left atrial size was normal.  6. Right atrial size was normal.  7. The mitral valve is normal in structure. Trivial mitral valve  regurgitation. No evidence of mitral stenosis.  8. The tricuspid valve is normal in structure. Tricuspid valve  regurgitation is not demonstrated.  9. The aortic valve has an indeterminant number of cusps. Aortic valve  regurgitation is not visualized. Mild aortic valve sclerosis without  stenosis.  10. The pulmonic valve was not well visualized.  Pulmonic valve  regurgitation is not visualized.  11. Normal pulmonary artery systolic pressure.        Assessment and Plan  1. PAF - EKG shows she remians in SR on tikosyn - no symptoms, continue current meds  2. Preoperative evaluation - ok to proceed with endoscopy, would hold eliquis starting 2 days prior to procedure and resume the day after.      Arnoldo Lenis, M.D.

## 2019-11-29 NOTE — Progress Notes (Signed)
Patient was evaluated by cardiologist Dr. Harl Bowie today. Cardiac clearance received. Patient to proceed with EGD and colonoscopy with Dr. Loletha Carrow as scheduled.  Patient to hold Eliquis for 2 days prior to her procedure and resume the day after.

## 2019-12-01 DIAGNOSIS — I10 Essential (primary) hypertension: Secondary | ICD-10-CM | POA: Diagnosis not present

## 2019-12-01 DIAGNOSIS — E782 Mixed hyperlipidemia: Secondary | ICD-10-CM | POA: Diagnosis not present

## 2019-12-01 DIAGNOSIS — D510 Vitamin B12 deficiency anemia due to intrinsic factor deficiency: Secondary | ICD-10-CM | POA: Diagnosis not present

## 2019-12-01 DIAGNOSIS — E039 Hypothyroidism, unspecified: Secondary | ICD-10-CM | POA: Diagnosis not present

## 2019-12-01 DIAGNOSIS — E1122 Type 2 diabetes mellitus with diabetic chronic kidney disease: Secondary | ICD-10-CM | POA: Diagnosis not present

## 2019-12-01 DIAGNOSIS — N183 Chronic kidney disease, stage 3 unspecified: Secondary | ICD-10-CM | POA: Diagnosis not present

## 2019-12-01 DIAGNOSIS — D509 Iron deficiency anemia, unspecified: Secondary | ICD-10-CM | POA: Diagnosis not present

## 2019-12-01 DIAGNOSIS — D529 Folate deficiency anemia, unspecified: Secondary | ICD-10-CM | POA: Diagnosis not present

## 2019-12-02 NOTE — Telephone Encounter (Signed)
Please review 11/29/2019 cardiology visit and advise and patient was cleared for ENDO procedure only- and WAS cleared for [facility] hold

## 2019-12-04 NOTE — Progress Notes (Signed)
Electrophysiology Office Note:    Date:  12/05/2019   ID:  Miranda Wilkinson, DOB 1935-04-10, MRN [id number]  PCP:  Caryl Bis, MD  [facility] Cardiologist:  Carlyle Dolly, MD  [facility] Electrophysiologist:  None   Referring MD: Caryl Bis, MD   Chief Complaint: AF  History of Present Illness:    Miranda Wilkinson is a 84 y.o. female who presents for follow up regarding her AF. She is maintained on Tikosyn and apixaban. No further bleeding episodes. Labs in the beginning of September show normal renal function and electrolytes.  Past Medical History:  Diagnosis Date  . Allergy    seasonal allergies  . Anemia    IDA- on iron supplement  . Anxiety   . Atrial fibrillation ([place])   . Blood transfusion without reported diagnosis    2021- due to IDA/ low hemo (7.3)  . Cancer ([place]) 1976   bilaterl thyroid ca  . Diabetes mellitus without complication ([place])    on meds  . GERD (gastroesophageal reflux disease)    on meds---OTC  . Headache(784.0)    hx migraines  . Hyperlipidemia    on meds  . Hypertension    on meds  . Hypothyroidism    thyroidectomy in 1976- on meds at  this time  . Shortness of breath   . Sinus congestion   . Sleep apnea    study done in [place], [place], use CPAP----with home O2 concentrater at night with CPAP    Past Surgical History:  Procedure Laterality Date  . APPENDECTOMY    . CARDIOVERSION N/A 10/27/2019   Procedure: CARDIOVERSION;  Surgeon: [name], MD;  Location: [facility] ENDOSCOPY;  Service: Cardiovascular;  Laterality: N/A;  . CHOLECYSTECTOMY    . OPEN REDUCTION INTERNAL FIXATION (ORIF) TIBIA/FIBULA FRACTURE Right 11/03/2013   Procedure: OPEN REDUCTION INTERNAL FIXATION (ORIF) RIGHT TIBIA/FIBULA PILON FRACTURE;  Surgeon: Wylene Simmer, MD;  Location: [place];  Service: Orthopedics;  Laterality: Right;  . OVARIAN CYST SURGERY Left    age 54  . THYROIDECTOMY Bilateral 1976  . TUBAL LIGATION      Current Medications: Current  Meds  Medication Sig  . acetaminophen (TYLENOL) 500 MG tablet Take 500-1,000 mg by mouth every 6 (six) hours as needed (for pain.).  Marland Kitchen B Complex Vitamins (B COMPLEX 50) TABS daily.   . bisoprolol (ZEBETA) 10 MG tablet Take 1 tablet (10 mg total) by mouth daily.  . cetirizine (ZYRTEC) 10 MG tablet Take 10 mg by mouth daily as needed for allergies.   . Cholecalciferol (VITAMIN D3) 50 MCG (2000 UT) capsule Take 2,000 Units by mouth daily.  Marland Kitchen dofetilide (TIKOSYN) 500 MCG capsule Take 1 capsule (500 mcg total) by mouth 2 (two) times daily. (0900 & 2100)  . ELIQUIS 5 MG TABS tablet TAKE 1 TABLET TWICE A DAY (Patient taking differently: Take 5 mg by mouth 2 (two) times daily. (0900 & 2100))  . glipiZIDE (GLUCOTROL) 10 MG tablet Take 10 mg by mouth daily.   . iron polysaccharides (NU-IRON) 150 MG capsule Take 1 capsule (150 mg total) by mouth daily.  Marland Kitchen levothyroxine (SYNTHROID) 112 MCG tablet Take 112 mcg by mouth daily.   Marland Kitchen losartan (COZAAR) 100 MG tablet Take 1 tablet (100 mg total) by mouth daily.  . Magnesium 250 MG TABS Take 250 mg by mouth every other day. In the morning.  . melatonin 5 MG TABS Take 1.25-5 mg by mouth at bedtime as needed (sleep).   Marland Kitchen  metFORMIN (GLUCOPHAGE-XR) 500 MG 24 hr tablet Take 1,000 mg by mouth daily with supper.  . Multiple Vitamins-Minerals (MULTIVITAMIN WITH MINERALS) tablet Take 1 tablet by mouth daily.  . Omeprazole 20 MG TBDD   . POTASSIUM GLUCONATE PO Take 595 mg by mouth every other day.  . simvastatin (ZOCOR) 40 MG tablet Take 20 mg by mouth daily with supper.   . umeclidinium-vilanterol (ANORO ELLIPTA) 62.5-25 MCG/INH AEPB Only open the device one time and then take your two separate drags to be sure you get it all  . [DISCONTINUED] dofetilide (TIKOSYN) 500 MCG capsule Take 1 capsule (500 mcg total) by mouth 2 (two) times daily. (Patient taking differently: Take 500 mcg by mouth 2 (two) times daily. (0900 & 2100))  . [DISCONTINUED] dofetilide (TIKOSYN) 500 MCG  capsule Take 1 capsule (500 mcg total) by mouth 2 (two) times daily. (0900 & 2100)     Allergies:   Patient has no known allergies.   Social History   Socioeconomic History  . Marital status: Single    Spouse name: Not on file  . Number of children: Not on file  . Years of education: Not on file  . Highest education level: Not on file  Occupational History  . Not on file  Tobacco Use  . Smoking status: Former Smoker    Packs/day: 1.50    Years: 60.00    Pack years: 90.00    Types: Cigarettes    Quit date: 11/03/2011    Years since quitting: 8.0  . Smokeless tobacco: Never Used  Vaping Use  . Vaping Use: Never used  Substance and Sexual Activity  . Alcohol use: No  . Drug use: No  . Sexual activity: Not Currently  Other Topics Concern  . Not on file  Social History Narrative  . Not on file   Social Determinants of Health   Financial Resource Strain:   . Difficulty of Paying Living Expenses: Not on file  Food Insecurity:   . Worried About Charity fundraiser in the Last Year: Not on file  . Ran Out of Food in the Last Year: Not on file  Transportation Needs:   . Lack of Transportation (Medical): Not on file  . Lack of Transportation (Non-Medical): Not on file  Physical Activity:   . Days of Exercise per Week: Not on file  . Minutes of Exercise per Session: Not on file  Stress:   . Feeling of Stress : Not on file  Social Connections:   . Frequency of Communication with Friends and Family: Not on file  . Frequency of Social Gatherings with Friends and Family: Not on file  . Attends Religious Services: Not on file  . Active Member of Clubs or Organizations: Not on file  . Attends Archivist Meetings: Not on file  . Marital Status: Not on file     Family History: The patient's family history includes Arrhythmia in her mother and sister; Cancer in her sister; Diabetes in her mother; Esophageal cancer (age of onset: 43) in her brother; Heart attack in her  father; Heart disease in her mother; Hypertension in her brother, brother, and sister; Stomach cancer (age of onset: 65) in her brother; Stroke in her brother, father, mother, and sister; Thyroid disease in her mother. There is no history of Colon polyps, Colon cancer, or Rectal cancer.  ROS:   Please see the history of present illness.    All other systems reviewed and are negative.  EKGs/Labs/Other  Studies Reviewed:    The following studies were reviewed today: Records, labs, ecg  EKG:  The ekg ordered today demonstrates sinus rhythm with QT 475ms.  Recent Labs: 11/04/2019: ALT 21 11/05/2019: Magnesium 2.2 11/15/2019: BUN 16; Creatinine, Ser 1.05; Hemoglobin 11.3; Platelets 263.0; Potassium 4.4; Sodium 134  Recent Lipid Panel No results found for: CHOL, TRIG, HDL, CHOLHDL, VLDL, LDLCALC, LDLDIRECT  Physical Exam:    VS:  BP (!) 150/62   Pulse 68   Ht 5\' 5"  (1.651 m)   Wt 229 lb (103.9 kg)   SpO2 97%   BMI 38.11 kg/m     Wt Readings from Last 3 Encounters:  12/05/19 229 lb (103.9 kg)  11/29/19 230 lb (104.3 kg)  11/23/19 228 lb (103.4 kg)     GEN:  Well nourished, well developed in no acute distress. Hard of hearing. Obese. HEENT: Normal NECK: No JVD; No carotid bruits LYMPHATICS: No lymphadenopathy CARDIAC: RRR, no murmurs, rubs, gallops RESPIRATORY:  Clear to auscultation without rales, wheezing or rhonchi  ABDOMEN: Soft, non-tender, non-distended MUSCULOSKELETAL:  No edema; No deformity  SKIN: Warm and dry NEUROLOGIC:  Alert and oriented x 3 PSYCHIATRIC:  Normal affect   ASSESSMENT:    1. Persistent atrial fibrillation ([place])   2. Encounter for therapeutic drug level monitoring    PLAN:    In order of problems listed above:  1. Persistent atrial fibrillation Now in sinus on tikosyn. QT appropriate. Recent labs show normal renal function. Plan to refill Tikosyn today and see back in 3 months for ECG and labwork.   Medication Adjustments/Labs and Tests  Ordered: Current medicines are reviewed at length with the patient today.  Concerns regarding medicines are outlined above.  Orders Placed This Encounter  Procedures  . EKG 12-Lead   Meds ordered this encounter  Medications  . DISCONTD: dofetilide (TIKOSYN) 500 MCG capsule    Sig: Take 1 capsule (500 mcg total) by mouth 2 (two) times daily. (0900 & 2100)    Dispense:  90 capsule    Refill:  3  . dofetilide (TIKOSYN) 500 MCG capsule    Sig: Take 1 capsule (500 mcg total) by mouth 2 (two) times daily. (0900 & 2100)    Dispense:  90 capsule    Refill:  3     Signed, Lars Mage, MD, [facility]  12/05/2019 3:47 PM    Electrophysiology [place]

## 2019-12-05 ENCOUNTER — Ambulatory Visit (INDEPENDENT_AMBULATORY_CARE_PROVIDER_SITE_OTHER): Payer: Medicare Other | Admitting: Cardiology

## 2019-12-05 ENCOUNTER — Other Ambulatory Visit: Payer: Self-pay

## 2019-12-05 VITALS — BP 150/62 | HR 68 | Ht 65.0 in | Wt 229.0 lb

## 2019-12-05 DIAGNOSIS — I482 Chronic atrial fibrillation, unspecified: Secondary | ICD-10-CM | POA: Diagnosis not present

## 2019-12-05 DIAGNOSIS — Z5181 Encounter for therapeutic drug level monitoring: Secondary | ICD-10-CM

## 2019-12-05 DIAGNOSIS — I4819 Other persistent atrial fibrillation: Secondary | ICD-10-CM

## 2019-12-05 DIAGNOSIS — D500 Iron deficiency anemia secondary to blood loss (chronic): Secondary | ICD-10-CM | POA: Diagnosis not present

## 2019-12-05 MED ORDER — DOFETILIDE 500 MCG PO CAPS: 500.0000 ug | ORAL_CAPSULE | Freq: Two times a day (BID) | ORAL | 3 refills | Status: DC

## 2019-12-05 NOTE — Patient Instructions (Addendum)
Medication Instructions:  Your physician recommends that you continue on your current medications as directed. Please refer to the Current Medication list given to you today.  *If you need a refill on your cardiac medications before your next appointment, please call your pharmacy*  Lab Work: None ordered.  If you have labs (blood work) drawn today and your tests are completely normal, you will receive your results only by: Marland Kitchen MyChart Message (if you have MyChart) OR . A paper copy in the mail If you have any lab test that is abnormal or we need to change your treatment, we will call you to review the results.  Testing/Procedures: None ordered.  Follow-Up: At [facility], you and your health needs are our priority.  As part of our continuing mission to provide you with exceptional heart care, we have created designated Provider Care Teams.  These Care Teams include your primary Cardiologist (physician) and Advanced Practice Providers (APPs -  Physician Assistants and Nurse Practitioners) who all work together to provide you with the care you need, when you need it.  We recommend signing up for the patient portal called "MyChart".  Sign up information is provided on this After Visit Summary.  MyChart is used to connect with patients for Virtual Visits (Telemedicine).  Patients are able to view lab/test results, encounter notes, upcoming appointments, etc.  Non-urgent messages can be sent to your provider as well.   To learn more about what you can do with MyChart, go to [email or web address].    Your next appointment:   Your physician wants you to follow-up in: 3 months    Other Instructions:

## 2019-12-07 ENCOUNTER — Emergency Department (HOSPITAL_COMMUNITY): Payer: Medicare Other

## 2019-12-07 ENCOUNTER — Inpatient Hospital Stay (HOSPITAL_COMMUNITY)
Admission: EM | Admit: 2019-12-07 | Discharge: 2019-12-13 | DRG: 871 | Disposition: A | Discharge status: Home Health | Payer: Medicare Other | Attending: Internal Medicine | Admitting: Internal Medicine

## 2019-12-07 DIAGNOSIS — R6520 Severe sepsis without septic shock: Secondary | ICD-10-CM | POA: Diagnosis not present

## 2019-12-07 DIAGNOSIS — N390 Urinary tract infection, site not specified: Secondary | ICD-10-CM

## 2019-12-07 DIAGNOSIS — R404 Transient alteration of awareness: Secondary | ICD-10-CM | POA: Diagnosis not present

## 2019-12-07 DIAGNOSIS — J810 Acute pulmonary edema: Secondary | ICD-10-CM | POA: Diagnosis present

## 2019-12-07 DIAGNOSIS — Z7901 Long term (current) use of anticoagulants: Secondary | ICD-10-CM

## 2019-12-07 DIAGNOSIS — J9601 Acute respiratory failure with hypoxia: Secondary | ICD-10-CM | POA: Diagnosis not present

## 2019-12-07 DIAGNOSIS — R300 Dysuria: Secondary | ICD-10-CM | POA: Diagnosis not present

## 2019-12-07 DIAGNOSIS — R0602 Shortness of breath: Secondary | ICD-10-CM | POA: Diagnosis not present

## 2019-12-07 DIAGNOSIS — F419 Anxiety disorder, unspecified: Secondary | ICD-10-CM | POA: Diagnosis present

## 2019-12-07 DIAGNOSIS — E782 Mixed hyperlipidemia: Secondary | ICD-10-CM | POA: Diagnosis not present

## 2019-12-07 DIAGNOSIS — R509 Fever, unspecified: Secondary | ICD-10-CM | POA: Diagnosis not present

## 2019-12-07 DIAGNOSIS — E861 Hypovolemia: Secondary | ICD-10-CM | POA: Diagnosis present

## 2019-12-07 DIAGNOSIS — I4891 Unspecified atrial fibrillation: Secondary | ICD-10-CM | POA: Diagnosis not present

## 2019-12-07 DIAGNOSIS — F331 Major depressive disorder, recurrent, moderate: Secondary | ICD-10-CM | POA: Diagnosis not present

## 2019-12-07 DIAGNOSIS — N1831 Chronic kidney disease, stage 3a: Secondary | ICD-10-CM | POA: Diagnosis present

## 2019-12-07 DIAGNOSIS — R410 Disorientation, unspecified: Secondary | ICD-10-CM | POA: Diagnosis not present

## 2019-12-07 DIAGNOSIS — Z800 Family history of malignant neoplasm of digestive organs: Secondary | ICD-10-CM

## 2019-12-07 DIAGNOSIS — Z20822 Contact with and (suspected) exposure to covid-19: Secondary | ICD-10-CM | POA: Diagnosis not present

## 2019-12-07 DIAGNOSIS — D509 Iron deficiency anemia, unspecified: Secondary | ICD-10-CM | POA: Diagnosis present

## 2019-12-07 DIAGNOSIS — Z833 Family history of diabetes mellitus: Secondary | ICD-10-CM

## 2019-12-07 DIAGNOSIS — R0902 Hypoxemia: Secondary | ICD-10-CM | POA: Diagnosis not present

## 2019-12-07 DIAGNOSIS — Z8249 Family history of ischemic heart disease and other diseases of the circulatory system: Secondary | ICD-10-CM

## 2019-12-07 DIAGNOSIS — E1122 Type 2 diabetes mellitus with diabetic chronic kidney disease: Secondary | ICD-10-CM | POA: Diagnosis not present

## 2019-12-07 DIAGNOSIS — I10 Essential (primary) hypertension: Secondary | ICD-10-CM | POA: Diagnosis not present

## 2019-12-07 DIAGNOSIS — E890 Postprocedural hypothyroidism: Secondary | ICD-10-CM | POA: Diagnosis present

## 2019-12-07 DIAGNOSIS — Z6836 Body mass index (BMI) 36.0-36.9, adult: Secondary | ICD-10-CM

## 2019-12-07 DIAGNOSIS — Z9049 Acquired absence of other specified parts of digestive tract: Secondary | ICD-10-CM

## 2019-12-07 DIAGNOSIS — L400 Psoriasis vulgaris: Secondary | ICD-10-CM | POA: Diagnosis not present

## 2019-12-07 DIAGNOSIS — J301 Allergic rhinitis due to pollen: Secondary | ICD-10-CM | POA: Diagnosis not present

## 2019-12-07 DIAGNOSIS — Z7984 Long term (current) use of oral hypoglycemic drugs: Secondary | ICD-10-CM

## 2019-12-07 DIAGNOSIS — E860 Dehydration: Secondary | ICD-10-CM | POA: Diagnosis present

## 2019-12-07 DIAGNOSIS — E871 Hypo-osmolality and hyponatremia: Secondary | ICD-10-CM | POA: Diagnosis present

## 2019-12-07 DIAGNOSIS — E039 Hypothyroidism, unspecified: Secondary | ICD-10-CM | POA: Diagnosis not present

## 2019-12-07 DIAGNOSIS — I509 Heart failure, unspecified: Secondary | ICD-10-CM | POA: Diagnosis present

## 2019-12-07 DIAGNOSIS — K7581 Nonalcoholic steatohepatitis (NASH): Secondary | ICD-10-CM | POA: Diagnosis not present

## 2019-12-07 DIAGNOSIS — Z79890 Hormone replacement therapy (postmenopausal): Secondary | ICD-10-CM

## 2019-12-07 DIAGNOSIS — G4733 Obstructive sleep apnea (adult) (pediatric): Secondary | ICD-10-CM | POA: Diagnosis present

## 2019-12-07 DIAGNOSIS — Z8349 Family history of other endocrine, nutritional and metabolic diseases: Secondary | ICD-10-CM

## 2019-12-07 DIAGNOSIS — A419 Sepsis, unspecified organism: Secondary | ICD-10-CM | POA: Diagnosis not present

## 2019-12-07 DIAGNOSIS — Z6837 Body mass index (BMI) 37.0-37.9, adult: Secondary | ICD-10-CM | POA: Diagnosis not present

## 2019-12-07 DIAGNOSIS — J449 Chronic obstructive pulmonary disease, unspecified: Secondary | ICD-10-CM | POA: Diagnosis not present

## 2019-12-07 DIAGNOSIS — Z823 Family history of stroke: Secondary | ICD-10-CM

## 2019-12-07 DIAGNOSIS — N179 Acute kidney failure, unspecified: Secondary | ICD-10-CM | POA: Diagnosis present

## 2019-12-07 DIAGNOSIS — R197 Diarrhea, unspecified: Secondary | ICD-10-CM

## 2019-12-07 DIAGNOSIS — I130 Hypertensive heart and chronic kidney disease with heart failure and stage 1 through stage 4 chronic kidney disease, or unspecified chronic kidney disease: Secondary | ICD-10-CM | POA: Diagnosis present

## 2019-12-07 DIAGNOSIS — K219 Gastro-esophageal reflux disease without esophagitis: Secondary | ICD-10-CM | POA: Diagnosis present

## 2019-12-07 DIAGNOSIS — Z87891 Personal history of nicotine dependence: Secondary | ICD-10-CM

## 2019-12-07 DIAGNOSIS — I4819 Other persistent atrial fibrillation: Secondary | ICD-10-CM | POA: Diagnosis present

## 2019-12-07 DIAGNOSIS — Z1212 Encounter for screening for malignant neoplasm of rectum: Secondary | ICD-10-CM | POA: Diagnosis not present

## 2019-12-07 DIAGNOSIS — Z23 Encounter for immunization: Secondary | ICD-10-CM | POA: Diagnosis not present

## 2019-12-07 DIAGNOSIS — E785 Hyperlipidemia, unspecified: Secondary | ICD-10-CM | POA: Diagnosis present

## 2019-12-07 DIAGNOSIS — Z79899 Other long term (current) drug therapy: Secondary | ICD-10-CM

## 2019-12-07 DIAGNOSIS — I4892 Unspecified atrial flutter: Secondary | ICD-10-CM | POA: Diagnosis present

## 2019-12-07 DIAGNOSIS — I082 Rheumatic disorders of both aortic and tricuspid valves: Secondary | ICD-10-CM | POA: Diagnosis present

## 2019-12-07 DIAGNOSIS — E872 Acidosis: Secondary | ICD-10-CM | POA: Diagnosis present

## 2019-12-07 DIAGNOSIS — J9611 Chronic respiratory failure with hypoxia: Secondary | ICD-10-CM | POA: Diagnosis not present

## 2019-12-07 NOTE — ED Triage Notes (Addendum)
Pt presents to ED from home BIB [facility]. Pt c/o fever and weakness today. Pt started antibiotics today and has declined during the day.   EMS given 1g tylenol, 500 ml IVF 100.1 HR - 140-160 108/72 98% on 2L [place]

## 2019-12-08 ENCOUNTER — Observation Stay (HOSPITAL_COMMUNITY): Payer: Medicare Other

## 2019-12-08 ENCOUNTER — Inpatient Hospital Stay (HOSPITAL_COMMUNITY): Payer: Medicare Other

## 2019-12-08 DIAGNOSIS — I129 Hypertensive chronic kidney disease with stage 1 through stage 4 chronic kidney disease, or unspecified chronic kidney disease: Secondary | ICD-10-CM | POA: Diagnosis not present

## 2019-12-08 DIAGNOSIS — E785 Hyperlipidemia, unspecified: Secondary | ICD-10-CM | POA: Diagnosis present

## 2019-12-08 DIAGNOSIS — J810 Acute pulmonary edema: Secondary | ICD-10-CM | POA: Diagnosis present

## 2019-12-08 DIAGNOSIS — E1122 Type 2 diabetes mellitus with diabetic chronic kidney disease: Secondary | ICD-10-CM | POA: Diagnosis present

## 2019-12-08 DIAGNOSIS — A419 Sepsis, unspecified organism: Secondary | ICD-10-CM

## 2019-12-08 DIAGNOSIS — Z9049 Acquired absence of other specified parts of digestive tract: Secondary | ICD-10-CM | POA: Diagnosis not present

## 2019-12-08 DIAGNOSIS — E861 Hypovolemia: Secondary | ICD-10-CM | POA: Diagnosis present

## 2019-12-08 DIAGNOSIS — J9601 Acute respiratory failure with hypoxia: Secondary | ICD-10-CM

## 2019-12-08 DIAGNOSIS — E871 Hypo-osmolality and hyponatremia: Secondary | ICD-10-CM | POA: Diagnosis present

## 2019-12-08 DIAGNOSIS — I4892 Unspecified atrial flutter: Secondary | ICD-10-CM | POA: Diagnosis present

## 2019-12-08 DIAGNOSIS — I10 Essential (primary) hypertension: Secondary | ICD-10-CM | POA: Diagnosis not present

## 2019-12-08 DIAGNOSIS — Z87891 Personal history of nicotine dependence: Secondary | ICD-10-CM | POA: Diagnosis not present

## 2019-12-08 DIAGNOSIS — N179 Acute kidney failure, unspecified: Secondary | ICD-10-CM

## 2019-12-08 DIAGNOSIS — K219 Gastro-esophageal reflux disease without esophagitis: Secondary | ICD-10-CM | POA: Diagnosis present

## 2019-12-08 DIAGNOSIS — Z823 Family history of stroke: Secondary | ICD-10-CM | POA: Diagnosis not present

## 2019-12-08 DIAGNOSIS — G4733 Obstructive sleep apnea (adult) (pediatric): Secondary | ICD-10-CM | POA: Diagnosis present

## 2019-12-08 DIAGNOSIS — E039 Hypothyroidism, unspecified: Secondary | ICD-10-CM | POA: Diagnosis not present

## 2019-12-08 DIAGNOSIS — E890 Postprocedural hypothyroidism: Secondary | ICD-10-CM | POA: Diagnosis present

## 2019-12-08 DIAGNOSIS — E872 Acidosis: Secondary | ICD-10-CM | POA: Diagnosis present

## 2019-12-08 DIAGNOSIS — I4819 Other persistent atrial fibrillation: Secondary | ICD-10-CM | POA: Diagnosis present

## 2019-12-08 DIAGNOSIS — R6520 Severe sepsis without septic shock: Secondary | ICD-10-CM | POA: Diagnosis present

## 2019-12-08 DIAGNOSIS — R0602 Shortness of breath: Secondary | ICD-10-CM | POA: Diagnosis not present

## 2019-12-08 DIAGNOSIS — D509 Iron deficiency anemia, unspecified: Secondary | ICD-10-CM | POA: Diagnosis present

## 2019-12-08 DIAGNOSIS — R197 Diarrhea, unspecified: Secondary | ICD-10-CM

## 2019-12-08 DIAGNOSIS — I4891 Unspecified atrial fibrillation: Secondary | ICD-10-CM

## 2019-12-08 DIAGNOSIS — I509 Heart failure, unspecified: Secondary | ICD-10-CM | POA: Diagnosis present

## 2019-12-08 DIAGNOSIS — R918 Other nonspecific abnormal finding of lung field: Secondary | ICD-10-CM | POA: Diagnosis not present

## 2019-12-08 DIAGNOSIS — I130 Hypertensive heart and chronic kidney disease with heart failure and stage 1 through stage 4 chronic kidney disease, or unspecified chronic kidney disease: Secondary | ICD-10-CM | POA: Diagnosis present

## 2019-12-08 DIAGNOSIS — Z20822 Contact with and (suspected) exposure to covid-19: Secondary | ICD-10-CM | POA: Diagnosis present

## 2019-12-08 DIAGNOSIS — E119 Type 2 diabetes mellitus without complications: Secondary | ICD-10-CM | POA: Diagnosis not present

## 2019-12-08 DIAGNOSIS — N1831 Chronic kidney disease, stage 3a: Secondary | ICD-10-CM | POA: Diagnosis present

## 2019-12-08 DIAGNOSIS — N390 Urinary tract infection, site not specified: Secondary | ICD-10-CM | POA: Diagnosis present

## 2019-12-08 LAB — CBC WITH DIFFERENTIAL/PLATELET
Abs Immature Granulocytes: 0.11 10*3/uL — ABNORMAL HIGH (ref 0.00–0.07)
Basophils Absolute: 0.1 10*3/uL (ref 0.0–0.1)
Basophils Relative: 0 %
Eosinophils Absolute: 0.1 10*3/uL (ref 0.0–0.5)
Eosinophils Relative: 0 %
HCT: 38.1 % (ref 36.0–46.0)
Hemoglobin: 11.8 g/dL — ABNORMAL LOW (ref 12.0–15.0)
Immature Granulocytes: 1 %
Lymphocytes Relative: 3 %
Lymphs Abs: 0.4 10*3/uL — ABNORMAL LOW (ref 0.7–4.0)
MCH: 27.9 pg (ref 26.0–34.0)
MCHC: 31 g/dL (ref 30.0–36.0)
MCV: 90.1 fL (ref 80.0–100.0)
Monocytes Absolute: 1 10*3/uL (ref 0.1–1.0)
Monocytes Relative: 6 %
Neutro Abs: 13.8 10*3/uL — ABNORMAL HIGH (ref 1.7–7.7)
Neutrophils Relative %: 90 %
Platelets: 194 10*3/uL (ref 150–400)
RBC: 4.23 MIL/uL (ref 3.87–5.11)
WBC: 15.4 10*3/uL — ABNORMAL HIGH (ref 4.0–10.5)
nRBC: 0 % (ref 0.0–0.2)

## 2019-12-08 LAB — RESP PANEL BY RT PCR (RSV, FLU A&B, COVID)
Influenza A by PCR: NEGATIVE
Influenza B by PCR: NEGATIVE
Respiratory Syncytial Virus by PCR: NEGATIVE
SARS Coronavirus 2 by RT PCR: NEGATIVE

## 2019-12-08 LAB — COMPREHENSIVE METABOLIC PANEL
ALT: 20 U/L (ref 0–44)
AST: 27 U/L (ref 15–41)
Albumin: 2.8 g/dL — ABNORMAL LOW (ref 3.5–5.0)
Alkaline Phosphatase: 59 U/L (ref 38–126)
Anion gap: 13 (ref 5–15)
BUN: 25 mg/dL — ABNORMAL HIGH (ref 8–23)
CO2: 19 mmol/L — ABNORMAL LOW (ref 22–32)
Calcium: 9 mg/dL (ref 8.9–10.3)
Chloride: 95 mmol/L — ABNORMAL LOW (ref 98–111)
Creatinine, Ser: 1.89 mg/dL — ABNORMAL HIGH (ref 0.44–1.00)
GFR calc Af Amer: 28 mL/min — ABNORMAL LOW (ref >60)
GFR calc non Af Amer: 24 mL/min — ABNORMAL LOW (ref >60)
Glucose, Bld: 246 mg/dL — ABNORMAL HIGH (ref 70–99)
Potassium: 3.5 mmol/L (ref 3.5–5.1)
Sodium: 127 mmol/L — ABNORMAL LOW (ref 135–145)
Total Bilirubin: 1.1 mg/dL (ref 0.3–1.2)
Total Protein: 6.9 g/dL (ref 6.5–8.1)

## 2019-12-08 LAB — LACTIC ACID, PLASMA
Lactic Acid, Venous: 3.1 mmol/L (ref 0.5–1.9)
Lactic Acid, Venous: 2.5 mmol/L (ref 0.5–1.9)
Lactic Acid, Venous: 2.1 mmol/L (ref 0.5–1.9)
Lactic Acid, Venous: 1.1 mmol/L (ref 0.5–1.9)

## 2019-12-08 LAB — URINALYSIS, ROUTINE W REFLEX MICROSCOPIC
Bilirubin Urine: NEGATIVE
Glucose, UA: NEGATIVE mg/dL
Ketones, ur: NEGATIVE mg/dL
Nitrite: NEGATIVE
Protein, ur: 100 mg/dL — AB
RBC / HPF: >50 RBC/hpf — ABNORMAL HIGH (ref 0–5)
Specific Gravity, Urine: 1.014 (ref 1.005–1.030)
WBC, UA: >50 WBC/hpf — ABNORMAL HIGH (ref 0–5)
pH: 5 (ref 5.0–8.0)

## 2019-12-08 LAB — I-STAT ARTERIAL BLOOD GAS, ED
Acid-base deficit: 3 mmol/L — ABNORMAL HIGH (ref 0.0–2.0)
Bicarbonate: 21.2 mmol/L (ref 20.0–28.0)
Calcium, Ion: 1.29 mmol/L (ref 1.15–1.40)
HCT: 37 % (ref 36.0–46.0)
Hemoglobin: 12.6 g/dL (ref 12.0–15.0)
O2 Saturation: 91 %
Patient temperature: 100
Potassium: 3.7 mmol/L (ref 3.5–5.1)
Sodium: 132 mmol/L — ABNORMAL LOW (ref 135–145)
TCO2: 22 mmol/L (ref 22–32)
pCO2 arterial: 34.6 mmHg (ref 32.0–48.0)
pH, Arterial: 7.399 (ref 7.350–7.450)
pO2, Arterial: 64 mmHg — ABNORMAL LOW (ref 83.0–108.0)

## 2019-12-08 LAB — HEMOGLOBIN A1C
Hgb A1c MFr Bld: 5.1 % (ref 4.8–5.6)
Mean Plasma Glucose: 99.67 mg/dL

## 2019-12-08 LAB — BASIC METABOLIC PANEL
Anion gap: 14 (ref 5–15)
BUN: 28 mg/dL — ABNORMAL HIGH (ref 8–23)
CO2: 20 mmol/L — ABNORMAL LOW (ref 22–32)
Calcium: 8.9 mg/dL (ref 8.9–10.3)
Chloride: 96 mmol/L — ABNORMAL LOW (ref 98–111)
Creatinine, Ser: 1.9 mg/dL — ABNORMAL HIGH (ref 0.44–1.00)
GFR calc Af Amer: 28 mL/min — ABNORMAL LOW (ref >60)
GFR calc non Af Amer: 24 mL/min — ABNORMAL LOW (ref >60)
Glucose, Bld: 162 mg/dL — ABNORMAL HIGH (ref 70–99)
Potassium: 4.3 mmol/L (ref 3.5–5.1)
Sodium: 130 mmol/L — ABNORMAL LOW (ref 135–145)

## 2019-12-08 LAB — GLUCOSE, CAPILLARY: Glucose-Capillary: 74 mg/dL (ref 70–99)

## 2019-12-08 LAB — PROTIME-INR
INR: 2.2 — ABNORMAL HIGH (ref 0.8–1.2)
Prothrombin Time: 23.7 seconds — ABNORMAL HIGH (ref 11.4–15.2)

## 2019-12-08 LAB — CBC
HCT: 39.5 % (ref 36.0–46.0)
Hemoglobin: 12 g/dL (ref 12.0–15.0)
MCH: 27.4 pg (ref 26.0–34.0)
MCHC: 30.4 g/dL (ref 30.0–36.0)
MCV: 90.2 fL (ref 80.0–100.0)
Platelets: 205 10*3/uL (ref 150–400)
RBC: 4.38 MIL/uL (ref 3.87–5.11)
WBC: 13.1 10*3/uL — ABNORMAL HIGH (ref 4.0–10.5)
nRBC: 0 % (ref 0.0–0.2)

## 2019-12-08 LAB — CBG MONITORING, ED
Glucose-Capillary: 203 mg/dL — ABNORMAL HIGH (ref 70–99)
Glucose-Capillary: 223 mg/dL — ABNORMAL HIGH (ref 70–99)
Glucose-Capillary: 122 mg/dL — ABNORMAL HIGH (ref 70–99)
Glucose-Capillary: 105 mg/dL — ABNORMAL HIGH (ref 70–99)

## 2019-12-08 LAB — TSH: TSH: 3.408 u[IU]/mL (ref 0.350–4.500)

## 2019-12-08 LAB — BRAIN NATRIURETIC PEPTIDE: B Natriuretic Peptide: 755.4 pg/mL — ABNORMAL HIGH (ref 0.0–100.0)

## 2019-12-08 MED ORDER — LACTATED RINGERS IV SOLN: INTRAVENOUS | Status: DC

## 2019-12-08 MED ORDER — SODIUM CHLORIDE 0.9 % IV SOLN: 1.0000 g | Freq: Every day | INTRAVENOUS | Status: DC

## 2019-12-08 MED ORDER — B COMPLEX 50 PO TABS: 1.0000 | ORAL_TABLET | Freq: Every day | ORAL | Status: DC

## 2019-12-08 MED ORDER — INSULIN ASPART 100 UNIT/ML ~~LOC~~ SOLN
0.0000 [IU] | Freq: Every day | SUBCUTANEOUS | Status: DC
  Administered 2019-12-10: 2 [IU] via SUBCUTANEOUS

## 2019-12-08 MED ORDER — LACTATED RINGERS IV BOLUS
500.0000 mL | Freq: Once | INTRAVENOUS | Status: AC
  Administered 2019-12-08: 500 mL via INTRAVENOUS

## 2019-12-08 MED ORDER — MELATONIN 5 MG PO TABS
2.5000 mg | ORAL_TABLET | Freq: Every evening | ORAL | Status: DC | PRN
  Filled 2019-12-08: qty 1

## 2019-12-08 MED ORDER — SODIUM CHLORIDE 0.9 % IV SOLN: Freq: Once | INTRAVENOUS | Status: AC

## 2019-12-08 MED ORDER — PANTOPRAZOLE SODIUM 40 MG PO TBEC: 40.0000 mg | DELAYED_RELEASE_TABLET | Freq: Every day | ORAL | Status: DC | PRN

## 2019-12-08 MED ORDER — DOFETILIDE 125 MCG PO CAPS
125.0000 ug | ORAL_CAPSULE | Freq: Two times a day (BID) | ORAL | Status: DC
  Administered 2019-12-08: 125 ug via ORAL
  Filled 2019-12-08 (×2): qty 1

## 2019-12-08 MED ORDER — ACETAMINOPHEN 325 MG PO TABS
650.0000 mg | ORAL_TABLET | Freq: Four times a day (QID) | ORAL | Status: DC | PRN
  Administered 2019-12-08 (×2): 650 mg via ORAL
  Filled 2019-12-08 (×2): qty 2

## 2019-12-08 MED ORDER — INSULIN ASPART 100 UNIT/ML ~~LOC~~ SOLN
0.0000 [IU] | Freq: Three times a day (TID) | SUBCUTANEOUS | Status: DC
  Administered 2019-12-08: 2 [IU] via SUBCUTANEOUS
  Administered 2019-12-08 – 2019-12-09 (×2): 1 [IU] via SUBCUTANEOUS
  Administered 2019-12-10: 2 [IU] via SUBCUTANEOUS
  Administered 2019-12-10: 1 [IU] via SUBCUTANEOUS
  Administered 2019-12-10: 2 [IU] via SUBCUTANEOUS
  Administered 2019-12-11 – 2019-12-12 (×2): 1 [IU] via SUBCUTANEOUS
  Administered 2019-12-12: 3 [IU] via SUBCUTANEOUS
  Administered 2019-12-12: 1 [IU] via SUBCUTANEOUS
  Administered 2019-12-13: 2 [IU] via SUBCUTANEOUS
  Administered 2019-12-13 (×2): 1 [IU] via SUBCUTANEOUS

## 2019-12-08 MED ORDER — APIXABAN 2.5 MG PO TABS
2.5000 mg | ORAL_TABLET | Freq: Two times a day (BID) | ORAL | Status: DC
  Administered 2019-12-08 – 2019-12-10 (×6): 2.5 mg via ORAL
  Filled 2019-12-08 (×8): qty 1

## 2019-12-08 MED ORDER — MAGNESIUM OXIDE 400 (241.3 MG) MG PO TABS
200.0000 mg | ORAL_TABLET | ORAL | Status: DC
  Administered 2019-12-09 – 2019-12-13 (×3): 200 mg via ORAL
  Filled 2019-12-08 (×4): qty 1

## 2019-12-08 MED ORDER — VITAMIN D 25 MCG (1000 UNIT) PO TABS
2000.0000 [IU] | ORAL_TABLET | Freq: Every day | ORAL | Status: DC
  Administered 2019-12-08 – 2019-12-13 (×6): 2000 [IU] via ORAL
  Filled 2019-12-08 (×6): qty 2

## 2019-12-08 MED ORDER — SODIUM CHLORIDE 0.9 % IV SOLN
2.0000 g | Freq: Every day | INTRAVENOUS | Status: DC
  Administered 2019-12-09 – 2019-12-13 (×5): 2 g via INTRAVENOUS
  Filled 2019-12-08: qty 20
  Filled 2019-12-08: qty 2
  Filled 2019-12-08: qty 20
  Filled 2019-12-08: qty 2
  Filled 2019-12-08: qty 20

## 2019-12-08 MED ORDER — ACETAMINOPHEN 650 MG RE SUPP: 650.0000 mg | Freq: Four times a day (QID) | RECTAL | Status: DC | PRN

## 2019-12-08 MED ORDER — HYDROXYZINE HCL 10 MG PO TABS
10.0000 mg | ORAL_TABLET | Freq: Once | ORAL | Status: AC | PRN
  Administered 2019-12-08: 10 mg via ORAL
  Filled 2019-12-08 (×2): qty 1

## 2019-12-08 MED ORDER — SODIUM CHLORIDE 0.9 % IV SOLN
1.0000 g | Freq: Once | INTRAVENOUS | Status: AC
  Administered 2019-12-08: 1 g via INTRAVENOUS
  Filled 2019-12-08: qty 10

## 2019-12-08 MED ORDER — POLYSACCHARIDE IRON COMPLEX 150 MG PO CAPS
150.0000 mg | ORAL_CAPSULE | Freq: Every day | ORAL | Status: DC
  Administered 2019-12-08 – 2019-12-13 (×6): 150 mg via ORAL
  Filled 2019-12-08 (×6): qty 1

## 2019-12-08 MED ORDER — ADULT MULTIVITAMIN W/MINERALS CH
1.0000 | ORAL_TABLET | Freq: Every day | ORAL | Status: DC
  Administered 2019-12-08 – 2019-12-13 (×6): 1 via ORAL
  Filled 2019-12-08 (×6): qty 1

## 2019-12-08 MED ORDER — METOPROLOL TARTRATE 5 MG/5ML IV SOLN
5.0000 mg | Freq: Four times a day (QID) | INTRAVENOUS | Status: DC | PRN
  Administered 2019-12-08: 5 mg via INTRAVENOUS
  Filled 2019-12-08: qty 5

## 2019-12-08 MED ORDER — SODIUM CHLORIDE 0.9 % IV BOLUS
500.0000 mL | Freq: Once | INTRAVENOUS | Status: DC
  Administered 2019-12-08: 500 mL via INTRAVENOUS

## 2019-12-08 MED ORDER — DILTIAZEM LOAD VIA INFUSION
10.0000 mg | Freq: Once | INTRAVENOUS | Status: AC
  Administered 2019-12-08: 10 mg via INTRAVENOUS
  Filled 2019-12-08: qty 10

## 2019-12-08 MED ORDER — ONDANSETRON HCL 4 MG/2ML IJ SOLN
4.0000 mg | Freq: Three times a day (TID) | INTRAMUSCULAR | Status: DC | PRN
  Administered 2019-12-08: 4 mg via INTRAVENOUS
  Filled 2019-12-08: qty 2

## 2019-12-08 MED ORDER — SIMVASTATIN 20 MG PO TABS
20.0000 mg | ORAL_TABLET | Freq: Every day | ORAL | Status: DC
  Administered 2019-12-08 – 2019-12-13 (×6): 20 mg via ORAL
  Filled 2019-12-08 (×7): qty 1

## 2019-12-08 MED ORDER — LORATADINE 10 MG PO TABS
10.0000 mg | ORAL_TABLET | Freq: Every day | ORAL | Status: DC
  Administered 2019-12-08 – 2019-12-13 (×6): 10 mg via ORAL
  Filled 2019-12-08 (×6): qty 1

## 2019-12-08 MED ORDER — ALBUTEROL SULFATE HFA 108 (90 BASE) MCG/ACT IN AERS
2.0000 | INHALATION_SPRAY | Freq: Four times a day (QID) | RESPIRATORY_TRACT | Status: DC
  Administered 2019-12-08: 2 via RESPIRATORY_TRACT
  Filled 2019-12-08: qty 6.7

## 2019-12-08 MED ORDER — DILTIAZEM HCL-DEXTROSE 125-5 MG/125ML-% IV SOLN (PREMIX)
5.0000 mg/h | INTRAVENOUS | Status: DC
  Administered 2019-12-08: 5 mg/h via INTRAVENOUS
  Administered 2019-12-09: 12.5 mg/h via INTRAVENOUS
  Filled 2019-12-08 (×3): qty 125

## 2019-12-08 MED ORDER — OMEPRAZOLE 20 MG PO TBDD: 20.0000 mg | DELAYED_RELEASE_TABLET | ORAL | Status: DC | PRN

## 2019-12-08 MED ORDER — LEVOTHYROXINE SODIUM 112 MCG PO TABS
112.0000 ug | ORAL_TABLET | Freq: Every day | ORAL | Status: DC
  Administered 2019-12-08 – 2019-12-13 (×6): 112 ug via ORAL
  Filled 2019-12-08 (×6): qty 1

## 2019-12-08 NOTE — ED Notes (Signed)
Patient had bowel movement but urinated in sample while using the bedpan. Unable to send ordered specimens at this time.

## 2019-12-08 NOTE — ED Provider Notes (Signed)
[facility] EMERGENCY DEPARTMENT Provider Note   CSN: [id number] Arrival date & time: 12/07/19  2203     History Chief Complaint  Patient presents with  . Fever  . Dysuria    Miranda Wilkinson is a 84 y.o. female.  Patient to ED with daughter who provides details of history. The patient was diagnosed with a urinary tract infection yesterday morning (12/07/19) at her doctor's office and started on antibiotics. Daughter reports that she developed symptoms throughout the day of diarrhea (nonbloody), weakness, confusion, prompting emergency department evaluation. No cough or SOB. She does not drink water, per daughter, only caffeinated drinks and states her urine is "the color of her diarrhea". No vomiting. She is not eating. History of atrial fibrillation on Eliquis, DM, HTN, anemia, CKD, CHF, HLD, COPD.   The history is provided by the patient. No language interpreter was used.  Fever Associated symptoms: confusion, diarrhea and dysuria   Associated symptoms: no chest pain, no cough and no vomiting   Dysuria Associated symptoms: fever   Associated symptoms: no abdominal pain, no flank pain and no vomiting        Past Medical History:  Diagnosis Date  . Allergy    seasonal allergies  . Anemia    IDA- on iron supplement  . Anxiety   . Atrial fibrillation ([place])   . Blood transfusion without reported diagnosis    2021- due to IDA/ low hemo (7.3)  . Cancer ([place]) 1976   bilaterl thyroid ca  . Diabetes mellitus without complication ([place])    on meds  . GERD (gastroesophageal reflux disease)    on meds---OTC  . Headache(784.0)    hx migraines  . Hyperlipidemia    on meds  . Hypertension    on meds  . Hypothyroidism    thyroidectomy in 1976- on meds at  this time  . Shortness of breath   . Sinus congestion   . Sleep apnea    study done in [place], [place], use CPAP----with home O2 concentrater at night with CPAP    Patient Active Problem List   Diagnosis Date  Noted  . Iron deficiency anemia 11/05/2019  . Symptomatic anemia 11/05/2019  . Atypical atrial flutter ([place]) 11/03/2019  . Persistent atrial fibrillation ([place]) 10/14/2019  . Secondary hypercoagulable state ([place]) 10/14/2019  . COPD GOLD 0/ group B 09/26/2019  . Morbid obesity due to excess calories ([place]) comp by hyperlipidemia/ dm/ hbp 09/26/2019  . Depression 11/22/2013  . Hyperlipemia 11/22/2013  . Allergic rhinitis 11/22/2013  . Unspecified hypothyroidism 11/08/2013  . Diabetes ([place]) 11/08/2013  . Essential hypertension, benign 11/08/2013  . Closed right ankle fracture 11/03/2013    Past Surgical History:  Procedure Laterality Date  . APPENDECTOMY    . CARDIOVERSION N/A 10/27/2019   Procedure: CARDIOVERSION;  Surgeon: [name], MD;  Location: [facility] ENDOSCOPY;  Service: Cardiovascular;  Laterality: N/A;  . CHOLECYSTECTOMY    . OPEN REDUCTION INTERNAL FIXATION (ORIF) TIBIA/FIBULA FRACTURE Right 11/03/2013   Procedure: OPEN REDUCTION INTERNAL FIXATION (ORIF) RIGHT TIBIA/FIBULA PILON FRACTURE;  Surgeon: Wylene Simmer, MD;  Location: [place];  Service: Orthopedics;  Laterality: Right;  . OVARIAN CYST SURGERY Left    age 16  . THYROIDECTOMY Bilateral 1976  . TUBAL LIGATION       OB History   No obstetric history on file.     Family History  Problem Relation Age of Onset  . Stroke Mother        brain  hemmorhage  . Diabetes Mother   . Heart disease Mother   . Thyroid disease Mother   . Arrhythmia Mother   . Stroke Father   . Heart attack Father        blood clot in brain  . Stroke Sister   . Cancer Sister   . Stomach cancer Brother 72  . Hypertension Brother   . Esophageal cancer Brother 13  . Stroke Brother   . Hypertension Brother   . Arrhythmia Sister   . Hypertension Sister   . Colon polyps Neg Hx   . Colon cancer Neg Hx   . Rectal cancer Neg Hx     Social History   Tobacco Use  . Smoking status: Former Smoker    Packs/day: 1.50    Years: 60.00     Pack years: 90.00    Types: Cigarettes    Quit date: 11/03/2011    Years since quitting: 8.1  . Smokeless tobacco: Never Used  Vaping Use  . Vaping Use: Never used  Substance Use Topics  . Alcohol use: No  . Drug use: No    Home Medications Prior to Admission medications   Medication Sig Start Date End Date Taking? Authorizing Provider  acetaminophen (TYLENOL) 500 MG tablet Take 500-1,000 mg by mouth every 6 (six) hours as needed (for pain.).    [provider]  B Complex Vitamins (B COMPLEX 50) TABS daily.  10/28/19   [provider]  bisoprolol (ZEBETA) 10 MG tablet Take 1 tablet (10 mg total) by mouth daily. 10/28/19   Shirley Friar, PA-C  cetirizine (ZYRTEC) 10 MG tablet Take 10 mg by mouth daily as needed for allergies.     [provider]  Cholecalciferol (VITAMIN D3) 50 MCG (2000 UT) capsule Take 2,000 Units by mouth daily.    [provider]  dofetilide (TIKOSYN) 500 MCG capsule Take 1 capsule (500 mcg total) by mouth 2 (two) times daily. (0900 & 2100) 12/05/19   Vickie Epley, MD  ELIQUIS 5 MG TABS tablet TAKE 1 TABLET TWICE A DAY Patient taking differently: Take 5 mg by mouth 2 (two) times daily. (0900 & 2100) 08/09/19   Branch, Alphonse Guild, MD  glipiZIDE (GLUCOTROL) 10 MG tablet Take 10 mg by mouth daily.     [provider]  iron polysaccharides (NU-IRON) 150 MG capsule Take 1 capsule (150 mg total) by mouth daily. 11/05/19   Mosetta Anis, MD  levothyroxine (SYNTHROID) 112 MCG tablet Take 112 mcg by mouth daily.     [provider]  losartan (COZAAR) 100 MG tablet Take 1 tablet (100 mg total) by mouth daily. 11/03/19   Fenton, Clint R, PA  Magnesium 250 MG TABS Take 250 mg by mouth every other day. In the morning.    [provider]  melatonin 5 MG TABS Take 1.25-5 mg by mouth at bedtime as needed (sleep).     [provider]  metFORMIN (GLUCOPHAGE-XR) 500 MG 24 hr tablet Take 1,000 mg by mouth  daily with supper.    [provider]  Multiple Vitamins-Minerals (MULTIVITAMIN WITH MINERALS) tablet Take 1 tablet by mouth daily.    [provider]  Omeprazole 20 MG TBDD  10/28/19   [provider]  POTASSIUM GLUCONATE PO Take 595 mg by mouth every other day.    [provider]  simvastatin (ZOCOR) 40 MG tablet Take 20 mg by mouth daily with supper.     [provider]  umeclidinium-vilanterol (ANORO ELLIPTA) 62.5-25 MCG/INH AEPB Only open the device one time and then take your two separate drags to be sure you get it all 09/26/19   Tanda Rockers, MD    Allergies    Patient has no known allergies.  Review of Systems   Review of Systems  Constitutional: Positive for fever.  HENT: Negative.   Eyes: Negative for visual disturbance.  Respiratory: Negative.  Negative for cough and shortness of breath.   Cardiovascular: Negative.  Negative for chest pain.  Gastrointestinal: Positive for diarrhea. Negative for abdominal pain and vomiting.  Genitourinary: Positive for dysuria. Negative for flank pain.  Musculoskeletal: Negative.   Skin: Negative for color change.  Neurological: Positive for weakness. Negative for syncope.  Hematological: Bruises/bleeds easily.  Psychiatric/Behavioral: Positive for confusion.    Physical Exam Updated Vital Signs BP (!) 124/108 (BP Location: Right Arm)   Pulse (!) 44   Temp 98.5 F (36.9 C) (Oral)   Resp (!) 27   Ht 5\' 5"  (1.651 m)   Wt 99.8 kg   SpO2 95%   BMI 36.61 kg/m   Physical Exam Vitals and nursing note reviewed.  Constitutional:      General: She is not in acute distress.    Appearance: She is well-developed. She is obese.  HENT:     Head: Normocephalic.     Nose: Nose normal.     Mouth/Throat:     Mouth: Mucous membranes are dry.  Eyes:     Conjunctiva/sclera: Conjunctivae normal.  Cardiovascular:     Rate and Rhythm: Regular rhythm. Tachycardia present.     Heart sounds: No murmur  heard.   Pulmonary:     Effort: Pulmonary effort is normal.     Breath sounds: Normal breath sounds. No wheezing, rhonchi or rales.  Abdominal:     General: Bowel sounds are normal.     Palpations: Abdomen is soft.     Tenderness: There is no abdominal tenderness. There is no guarding or rebound.  Musculoskeletal:        General: Normal range of motion.     Cervical back: Normal range of motion and neck supple.     Right lower leg: Edema present.     Left lower leg: Edema present.  Skin:    General: Skin is warm and dry.     Coloration: Skin is not pale.     Findings: No rash.  Neurological:     General: No focal deficit present.     Mental Status: She is alert and oriented to person, place, and time.     ED Results / Procedures / Treatments   Labs (all labs ordered are listed, but only abnormal results are displayed) Labs Reviewed  COMPREHENSIVE METABOLIC PANEL - Abnormal; Notable for the following components:      Result Value   Sodium 127 (*)    Chloride 95 (*)    CO2 19 (*)    Glucose, Bld 246 (*)    BUN 25 (*)    Creatinine, Ser 1.89 (*)    Albumin 2.8 (*)    GFR calc non Af Amer 24 (*)    GFR calc Af Amer 28 (*)    All other components within normal limits  LACTIC ACID, PLASMA - Abnormal; Notable for the following components:   Lactic Acid, Venous 3.1 (*)    All other components within normal limits  CBC WITH DIFFERENTIAL/PLATELET - Abnormal; Notable for the following components:   WBC  15.4 (*)    Hemoglobin 11.8 (*)    Neutro Abs 13.8 (*)    Lymphs Abs 0.4 (*)    Abs Immature Granulocytes 0.11 (*)    All other components within normal limits  PROTIME-INR - Abnormal; Notable for the following components:   Prothrombin Time 23.7 (*)    INR 2.2 (*)    All other components within normal limits  CULTURE, BLOOD (ROUTINE X 2)  CULTURE, BLOOD (ROUTINE X 2)  LACTIC ACID, PLASMA  URINALYSIS, ROUTINE W REFLEX MICROSCOPIC    EKG None  Radiology DG Chest 2  View  Result Date: 12/07/2019 CLINICAL DATA:  Fever and weakness today. Symptoms worsening despite antibiotics. EXAM: CHEST - 2 VIEW COMPARISON:  11/04/2019 FINDINGS: Shallow inspiration. Cardiac enlargement. Slight interstitial pattern to the lungs is similar to prior study, possibly representing fibrosis or edema. No focal consolidation. No pleural effusions. No pneumothorax. Mediastinal contours appear intact. Calcified and tortuous aorta. Old right rib fractures. Degenerative changes in the spine. IMPRESSION: Cardiac enlargement. Slight interstitial pattern to the lungs suggesting fibrosis or edema. No focal consolidation. Electronically Signed   By: Lucienne Capers M.D.   On: 12/07/2019 23:17    Procedures Procedures (including critical care time)  Medications Ordered in ED Medications - No data to display  ED Course  I have reviewed the triage vital signs and the nursing notes.  Pertinent labs & imaging results that were available during my care of the patient were reviewed by me and considered in my medical decision making (see chart for details).    MDM Rules/Calculators/A&P                          Elderly patient to ED with caregiver daughter for evaluation of confusion, weakness, diarrhea after being diagnosed with UTI by PCP yesterday morning.   On my exam, she is tachycardic to 120's, in atrial fibrillation (with history), appears dry. Tachypneic without cough.  Cr elevated over comparable values to 1.89 c/w AKI. Lactic acid 3.1. UA pending. CXR without infiltrates, however, mild edema suggested which clinically correlates to 1+ bilateral LE edema and history of CHF. EF 70% in 2020.   She does not appear confused now, she is alert, awake, reliably contributory to history. She will need admission given clinical picture of sepsis. Will discuss with hospitalist.  Patient discussed with Dr. Marlowe Sax who accepts for admission.  Final Clinical Impression(s) / ED Diagnoses Final  diagnoses:  None   1. Sepsis  Rx / DC Orders ED Discharge Orders    None       Charlann Lange, PA-C 12/08/19 [place], [place], MD 12/09/19 [phone]

## 2019-12-08 NOTE — ED Notes (Signed)
Patients IV was infiltrated. IV fluids and antibiotics stopped and IV removed. Heat applied to swollen area.

## 2019-12-08 NOTE — ED Notes (Signed)
Pt HR increased 140-160's on the monitor, and experiencing labored breathing, and wheezing. MD notified.

## 2019-12-08 NOTE — Consult Note (Signed)
Cardiology Consultation:   Patient ID: Miranda Wilkinson MRN: [id number]; DOB: 1936-02-07  Admit date: 12/07/2019 Date of Consult: 12/08/2019  Primary Care Provider: Caryl Bis, MD [facility] Cardiologist: Carlyle Dolly, MD  [facility] Electrophysiologist:  Vickie Epley, MD    Patient Profile:   Miranda Wilkinson is a 84 y.o. female with a hx of persistent atrial fibrillation/flutter, HTN, DM, OSA, hypothyroidism and GERD who is being seen today for the evaluation of Afib at the request of Dr. Lupita Wilkinson.   Admitted for dofetilide loading 8/16-8/19/21. She initially converted on the medication but reverted back to afib. She underwent DCCV on 10/27/19 which only briefly converted her to [place]. She was aflutter when seen in afib clinic 11/03/19. Has issue with anemia which was resolved.   Seen by Dr. Quentin Wilkinson 12/05/19. He was in sinus rhythm.   History of Present Illness:   Miranda Wilkinson was dx with UTI by PCP yesterday morning and started on abx however last night had fever, chills, diarrhea and confusion. Brought to ER for further evaluation. BUN/Scr 25/1.8. Lactic acid 3.1. WBC 15.5. Admitted severe sepsis in setting of UTI. Given fluids and started on cefriaxone. She was noted in afib RVR. On home Tikosyn and Eliquis. Chest x-ray showing cardiac enlargement and slight interstitial pattern to the lungs suggesting fibrosis or edema.  BNP 755. Lactic acid 2.1. Hgb A1c 5.1. TSH 3.4.    Past Medical History:  Diagnosis Date  . Allergy    seasonal allergies  . Anemia    IDA- on iron supplement  . Anxiety   . Atrial fibrillation ([place])   . Blood transfusion without reported diagnosis    2021- due to IDA/ low hemo (7.3)  . Cancer ([place]) 1976   bilaterl thyroid ca  . Diabetes mellitus without complication ([place])    on meds  . GERD (gastroesophageal reflux disease)    on meds---OTC  . Headache(784.0)    hx migraines  . Hyperlipidemia    on meds  . Hypertension    on meds  .  Hypothyroidism    thyroidectomy in 1976- on meds at  this time  . Shortness of breath   . Sinus congestion   . Sleep apnea    study done in [place], [place], use CPAP----with home O2 concentrater at night with CPAP    Past Surgical History:  Procedure Laterality Date  . APPENDECTOMY    . CARDIOVERSION N/A 10/27/2019   Procedure: CARDIOVERSION;  Surgeon: [name], MD;  Location: [facility] ENDOSCOPY;  Service: Cardiovascular;  Laterality: N/A;  . CHOLECYSTECTOMY    . OPEN REDUCTION INTERNAL FIXATION (ORIF) TIBIA/FIBULA FRACTURE Right 11/03/2013   Procedure: OPEN REDUCTION INTERNAL FIXATION (ORIF) RIGHT TIBIA/FIBULA PILON FRACTURE;  Surgeon: Wylene Simmer, MD;  Location: [place];  Service: Orthopedics;  Laterality: Right;  . OVARIAN CYST SURGERY Left    age 64  . THYROIDECTOMY Bilateral 1976  . TUBAL LIGATION      Inpatient Medications: Scheduled Meds: . apixaban  2.5 mg Oral BID  . cholecalciferol  2,000 Units Oral Daily  . dofetilide  125 mcg Oral BID  . insulin aspart  0-5 Units Subcutaneous QHS  . insulin aspart  0-9 Units Subcutaneous TID WC  . iron polysaccharides  150 mg Oral Daily  . levothyroxine  112 mcg Oral Q0600  . loratadine  10 mg Oral Daily  . [START ON 12/09/2019] magnesium oxide  200 mg Oral QODAY  . multivitamin with minerals  1 tablet Oral  Daily  . simvastatin  20 mg Oral Q supper   Continuous Infusions: . [START ON 12/09/2019] cefTRIAXone (ROCEPHIN)  IV     PRN Meds: acetaminophen **OR** acetaminophen, melatonin, metoprolol tartrate, ondansetron (ZOFRAN) IV, pantoprazole  Allergies:   No Known Allergies  Social History:   Social History   Socioeconomic History  . Marital status: Single    Spouse name: Not on file  . Number of children: Not on file  . Years of education: Not on file  . Highest education level: Not on file  Occupational History  . Not on file  Tobacco Use  . Smoking status: Former Smoker    Packs/day: 1.50    Years: 60.00    Pack  years: 90.00    Types: Cigarettes    Quit date: 11/03/2011    Years since quitting: 8.1  . Smokeless tobacco: Never Used  Vaping Use  . Vaping Use: Never used  Substance and Sexual Activity  . Alcohol use: No  . Drug use: No  . Sexual activity: Not Currently  Other Topics Concern  . Not on file  Social History Narrative  . Not on file   Social Determinants of Health   Financial Resource Strain:   . Difficulty of Paying Living Expenses: Not on file  Food Insecurity:   . Worried About Charity fundraiser in the Last Year: Not on file  . Ran Out of Food in the Last Year: Not on file  Transportation Needs:   . Lack of Transportation (Medical): Not on file  . Lack of Transportation (Non-Medical): Not on file  Physical Activity:   . Days of Exercise per Week: Not on file  . Minutes of Exercise per Session: Not on file  Stress:   . Feeling of Stress : Not on file  Social Connections:   . Frequency of Communication with Friends and Family: Not on file  . Frequency of Social Gatherings with Friends and Family: Not on file  . Attends Religious Services: Not on file  . Active Member of Clubs or Organizations: Not on file  . Attends Archivist Meetings: Not on file  . Marital Status: Not on file  Intimate Partner Violence:   . Fear of Current or Ex-Partner: Not on file  . Emotionally Abused: Not on file  . Physically Abused: Not on file  . Sexually Abused: Not on file    Family History:   Family History  Problem Relation Age of Onset  . Stroke Mother        brain hemmorhage  . Diabetes Mother   . Heart disease Mother   . Thyroid disease Mother   . Arrhythmia Mother   . Stroke Father   . Heart attack Father        blood clot in brain  . Stroke Sister   . Cancer Sister   . Stomach cancer Brother 62  . Hypertension Brother   . Esophageal cancer Brother 47  . Stroke Brother   . Hypertension Brother   . Arrhythmia Sister   . Hypertension Sister   . Colon  polyps Neg Hx   . Colon cancer Neg Hx   . Rectal cancer Neg Hx      ROS:  Please see the history of present illness.  All other ROS reviewed and negative.     Physical Exam/Data:   Vitals:   12/08/19 1308 12/08/19 1315 12/08/19 1319 12/08/19 1320  BP: (!) 126/93 (!) 121/98  Pulse: (!) 120  (!) 131   Resp: (!) 30 (!) 32 (!) 22 (!) 25  Temp: 100 F (37.8 C)     TempSrc: Oral     SpO2: 92%   95%  Weight:      Height:       No intake or output data in the 24 hours ending 12/08/19 1353 Last 3 Weights 12/07/2019 12/05/2019 11/29/2019  Weight (lbs) 220 lb 229 lb 230 lb  Weight (kg) 99.791 kg 103.874 kg 104.327 kg     Body mass index is 36.61 kg/m.  General:  Well nourished, well developed, in no acute distress HEENT: normal Lymph: no adenopathy Neck: no JVD Endocrine:  No thryomegaly Vascular: No carotid bruits; FA pulses 2+ bilaterally without bruits  Cardiac:  normal S1, S2; IR Ir tachycardia; no murmur  Lungs:  Wheezing  Abd: soft, nontender, no hepatomegaly  Ext: trace edema Musculoskeletal:  No deformities, BUE and BLE strength normal and equal Skin: warm and dry  Neuro:  CNs 2-12 intact, no focal abnormalities noted Psych:  Normal affect   EKG:  The EKG was personally reviewed and demonstrates:  Atrial fibrillation at rate of 145 bpm Telemetry:  Telemetry was personally reviewed and demonstrates:  afib 140s  Relevant CV Studies:  Echo 02/2019  1. Left ventricular ejection fraction, by visual estimation, is 70 to  75%. The left ventricle has hyperdynamic function. There is no left  ventricular hypertrophy.  2. Left ventricular diastolic parameters are indeterminate.  3. The left ventricle has no regional wall motion abnormalities.  4. Global right ventricle has normal systolic function.The right  ventricular size is normal. No increase in right ventricular wall  thickness.  5. Left atrial size was normal.  6. Right atrial size was normal.  7. The  mitral valve is normal in structure. Trivial mitral valve  regurgitation. No evidence of mitral stenosis.  8. The tricuspid valve is normal in structure. Tricuspid valve  regurgitation is not demonstrated.  9. The aortic valve has an indeterminant number of cusps. Aortic valve  regurgitation is not visualized. Mild aortic valve sclerosis without  stenosis.  10. The pulmonic valve was not well visualized. Pulmonic valve  regurgitation is not visualized.  11. Normal pulmonary artery systolic pressure.   Laboratory Data:   Chemistry Recent Labs  Lab 12/07/19 2344 12/08/19 0500  NA 127* 130*  K 3.5 4.3  CL 95* 96*  CO2 19* 20*  GLUCOSE 246* 162*  BUN 25* 28*  CREATININE 1.89* 1.90*  CALCIUM 9.0 8.9  GFRNONAA 24* 24*  GFRAA 28* 28*  ANIONGAP 13 14    Recent Labs  Lab 12/07/19 2344  PROT 6.9  ALBUMIN 2.8*  AST 27  ALT 20  ALKPHOS 59  BILITOT 1.1   Hematology Recent Labs  Lab 12/07/19 2344 12/08/19 0500  WBC 15.4* 13.1*  RBC 4.23 4.38  HGB 11.8* 12.0  HCT 38.1 39.5  MCV 90.1 90.2  MCH 27.9 27.4  MCHC 31.0 30.4  RDW Not Measured Not Measured  PLT 194 205   BNP Recent Labs  Lab 12/08/19 0510  BNP 755.4*    Radiology/Studies:  DG Chest 2 View  Result Date: 12/07/2019 CLINICAL DATA:  Fever and weakness today. Symptoms worsening despite antibiotics. EXAM: CHEST - 2 VIEW COMPARISON:  11/04/2019 FINDINGS: Shallow inspiration. Cardiac enlargement. Slight interstitial pattern to the lungs is similar to prior study, possibly representing fibrosis or edema. No focal consolidation. No pleural effusions. No pneumothorax. Mediastinal contours appear  intact. Calcified and tortuous aorta. Old right rib fractures. Degenerative changes in the spine. IMPRESSION: Cardiac enlargement. Slight interstitial pattern to the lungs suggesting fibrosis or edema. No focal consolidation. Electronically Signed   By: Lucienne Capers M.D.   On: 12/07/2019 23:17    Assessment and Plan:     1. Persistent atrial fibrillation with RVR - Previously failed cardioversion however maintaining sinus rhythm when seen by Dr. Quentin Wilkinson 2 days ago. Again back In afib RVR in setting of severe sepsis. Hopefully she will convert by herself once resolution of acute illness. If not, may consider cardioversion prior to discharge. Continue Eliquis. Hold Tikosyn due to CKD. Stat cardizem drip for better rate. If BP drops, will give amiodarone.  - TSH is normal   2. Severe sepsis 2nd to UTI - Treatment per primary team    3. Acute on CKD III - Scr 1.89>>1.9 -as above.     For questions or updates, please contact [place] Please consult [email or web address] for contact info under    Jarrett Soho, PA  12/08/2019 1:53 PM

## 2019-12-08 NOTE — Progress Notes (Signed)
PROGRESS NOTE    EMMERSYN KRATZKE  HQP:[id number] DOB: 01-25-1936 DOA: 12/07/2019 PCP: Caryl Bis, MD   Chief Complaint  Patient presents with  . Fever  . Dysuria   Brief Narrative: 84 y.o. female with  persistent atrial fibrillation on Tikosyn and apixaban, IDA,T2DM,GERD,HTN,HLD,hypothyroidism, OSA on CPAP,CKD stage III presenting to the ED via EMS with fever, generalized weakness and diarrhea. She was diagnosed with UTI by her PCP 9:29 AM and was restarted on oral antibiotics. She was given 500 cc by EMS and also received 500 mL in the ED. In the ED A. fib with RVR heart rate in 120s tachypneic, bp soft. WBC 15.4, creatinine 1.8 (baseline 1.0), glucose 246, anion gap 13, LFTs normal, lactic acid 3.1, INR 2.2. Sodium 127,UA grossly abnormal RBC more than 50 WBC more than 50, leukocytes moderate nitrate negative cloudy.Blood culture x2 sent, COVID-19 negative, CXR " cardiac enlargement and slight interstitial pattern to the lungs suggesting fibrosis or edema; no focal consolidation".  Subjective: Daughter at bedside. Pt is heard of hearing c/o nausea, left flank pain, not eating well x1 week. no CP, mild SOB No BM sicne admissio- had diarrhea x3 yesterday am. abt to have one BM.  Repeat lab with sodium improving 130, creatinine 1.9, WBC down to 13.1 lactic acid 2.5 BNP 755. 2D echo of note on 02/24/2019 with normal EF 70 to 75% no LVH. Patient still febrile 102.3, tachycardic blood pressure soft in 90/60, on RA. BP up after 500 ml bolus in 599 ssytolic A fib w hr in 357S This afternoon hr in 160s and she appeared more labored in her breathing. Cardio consulted, repeat cxr ordered and held ivf.  Assessment & Plan:  Severe sepsis with UTI POA, with lactic acidosis. BP soft. Monitor BP, bolused w/ 500 ml NS- bp improved, repeat lactic acid downtrending to 2.1 monitor serial lactic acid.Still febrile but WBC downtrending. Increase ceftriaxone to 2 g until cultures are available.   Patient is needing oxygen  Shortness of breath/wheezing:Patient with audible wheezing and tachypnea this afternoon. She is in A. fib RVR.  Discontinued IV fluids for now.daughter reports he has mild COPD, audible wheezing on exam but lung sounds are clear,  repeat CXR this afternoon with similar mild cardiac enlargement and chronic bronchitic and interstitial prominence.  Checking  ABG. Cardiology has seen the patient, if she does not feel better after heart rate comes down we will try anxiolytics- discussed w/ daughter.  Her symptoms are worse after patient woke up and daughter was not at bedside and later daughter came back and she seems to have calmed down.Patient denies shortness of breath even though she has audible wheezing.Can consider  Steroid if needed, add proair inhaler.  Persistent atrial fibrillation, likely precipitated by UTI sepsis in the ED heart rate in 140s to 150s on presentation, patient is on Tikosyn Eliquis-but dose renally adjusted based on AKI.heart rate uncontrolled, admitting discussed w/ on call cardiologist-cont home meds once blood pressure improves holding for now.If heart rate remains poorly controlled consult cardiology for possible cardioversion. Cardio was consulted and they have recommended to hold Tikosyn and starting Cardizem for poorly controlled A. fib RVR in 160s  Abnormal chest x-ray with slight interstitial pattern fibrosis or edema BNP up to 755 but with normal LVEF in 2020, could be due to rapid A. Fib.  No crackles on exam, not needing oxygen - caution with hydration.  Hyponatremia: Suspect volume depletion poor oral intake for a week,continue IV fluids  AKI on CKD stage IIIa/normal anion gap metabolic acidosis, baseline creatinine around 1.0.Creatinine 1.8-1.9 likely from volume depletion poor oral intake and diarrhea. Keep on IVF.monitor BMP.    T2DM,HbA1c 5.1,holding home Metformin and glipizide and keeping on sliding scale. Sugar borderline  controlled. Recent Labs  Lab 12/08/19 0804 12/08/19 0857  GLUCAP 203* 223*   Hypertension: holding losartan bisoprolol given sepsis  HLD continue statin  Hypothyroidism continue home Synthroid.TSH 3.4 euthyroid.  IDA on iron supplementation continue the same.  Hemoglobin is stable, she has a GI follow-up for EGD and colonoscopy in October.  [facility] based upon previous anemia.  She was hospitalized recently needing blood transfusion for hemoglobin of 4.4 g  OSA CPAP at night.  Morbid obesity with BMI 36:Will benefit with weight loss and healthy lifestyle.  DVT prophylaxis: apixaban (ELIQUIS) tablet 2.5 mg Start: 12/08/19 1000 Code Status:   Code Status: Full Code  Family Communication: plan of care discussed with patient and her daughter at bedside  Status LP:[id number] ad  Observation Patient remains hospitalized for ongoing management of AKI, severe sepsis, A. fib with RVR at least 2 midnight stay.  Dispo: The patient is from: Home              Anticipated d/c is to: Home              Anticipated d/c date is: 2 days              Patient currently is not medically stable to d/c.  Nutrition: Diet Order            Diet heart healthy/carb modified Room service appropriate? Yes; Fluid consistency: Thin  Diet effective now                 Body mass index is 36.61 kg/m.  Consultants:see note  Procedures:see note Microbiology:see note Blood Culture No results found for: SDES, SPECREQUEST, CULT, REPTSTATUS  Other culture-see note  Medications: Scheduled Meds: . apixaban  2.5 mg Oral BID  . cholecalciferol  2,000 Units Oral Daily  . dofetilide  125 mcg Oral BID  . insulin aspart  0-5 Units Subcutaneous QHS  . insulin aspart  0-9 Units Subcutaneous TID WC  . iron polysaccharides  150 mg Oral Daily  . levothyroxine  112 mcg Oral Q0600  . loratadine  10 mg Oral Daily  . [START ON 12/09/2019] magnesium oxide  200 mg Oral QODAY  . multivitamin with minerals  1 tablet  Oral Daily  . simvastatin  20 mg Oral Q supper   Continuous Infusions: . cefTRIAXone (ROCEPHIN)  IV 1 g (12/08/19 0916)  . [START ON 12/09/2019] cefTRIAXone (ROCEPHIN)  IV    . lactated ringers      Antimicrobials: Anti-infectives (From admission, onward)   Start     Dose/Rate Route Frequency Ordered Stop   12/09/19 1000  cefTRIAXone (ROCEPHIN) 2 g in sodium chloride 0.9 % 100 mL IVPB        2 g 200 mL/hr over 30 Minutes Intravenous Daily 12/08/19 0755     12/09/19 0600  cefTRIAXone (ROCEPHIN) 1 g in sodium chloride 0.9 % 100 mL IVPB  Status:  Discontinued        1 g 200 mL/hr over 30 Minutes Intravenous Daily 12/08/19 0354 12/08/19 0755   12/08/19 0800  cefTRIAXone (ROCEPHIN) 1 g in sodium chloride 0.9 % 100 mL IVPB        1 g 200 mL/hr over 30 Minutes Intravenous  Once 12/08/19  4128     12/08/19 0315  cefTRIAXone (ROCEPHIN) 1 g in sodium chloride 0.9 % 100 mL IVPB        1 g 200 mL/hr over 30 Minutes Intravenous  Once 12/08/19 0308 12/08/19 0440     Objective: Vitals: Today's Vitals   12/08/19 0700 12/08/19 0728 12/08/19 0732 12/08/19 0852  BP:  90/60    Pulse:      Resp:      Temp: (!) 102.3 F (39.1 C)   (!) 100.6 F (38.1 C)  TempSrc: Rectal   Rectal  SpO2:      Weight:      Height:      PainSc:  0-No pain 0-No pain    No intake or output data in the 24 hours ending 12/08/19 0922 Filed Weights   12/07/19 2249  Weight: 99.8 kg   Weight change:   Intake/Output from previous day: No intake/output data recorded. Intake/Output this shift: No intake/output data recorded.  Examination General exam: AAO, on RA, NAD, Obese. Hard of hreaing. HEENT:Oral mucosa moist, Ear/Nose WNL grossly,dentition normal. Respiratory system: bilaterally clear, audible wheezing from distant- some tachypnea. Air entry + but no wheezing. Cardiovascular system: S1 & S2 +, regular, No JVD.  Irregularly irregular heart rate. Gastrointestinal system: Abdomen soft, NT,ND, BS+. Nervous  System:Alert, awake, moving extremities and grossly nonfocal Extremities: No edema, distal peripheral pulses palpable.  Skin: No rashes,no icterus. MSK: Normal muscle bulk,tone, power  Data Reviewed: I have personally reviewed following labs and imaging studies CBC: Recent Labs  Lab 12/07/19 2344 12/08/19 0500  WBC 15.4* 13.1*  NEUTROABS 13.8*  --   HGB 11.8* 12.0  HCT 38.1 39.5  MCV 90.1 90.2  PLT 194 786   Basic Metabolic Panel: Recent Labs  Lab 12/07/19 2344 12/08/19 0500  NA 127* 130*  K 3.5 4.3  CL 95* 96*  CO2 19* 20*  GLUCOSE 246* 162*  BUN 25* 28*  CREATININE 1.89* 1.90*  CALCIUM 9.0 8.9   GFR: Estimated Creatinine Clearance: 26.7 mL/min (A) (by C-G formula based on SCr of 1.9 mg/dL (H)). Liver Function Tests: Recent Labs  Lab 12/07/19 2344  AST 27  ALT 20  ALKPHOS 59  BILITOT 1.1  PROT 6.9  ALBUMIN 2.8*   No results for input(s): LIPASE, AMYLASE in the last 168 hours. No results for input(s): AMMONIA in the last 168 hours. Coagulation Profile: Recent Labs  Lab 12/07/19 2344  INR 2.2*   Cardiac Enzymes: No results for input(s): CKTOTAL, CKMB, CKMBINDEX, TROPONINI in the last 168 hours. BNP (last 3 results) No results for input(s): PROBNP in the last 8760 hours. HbA1C: Recent Labs    12/08/19 0510  HGBA1C 5.1   CBG: Recent Labs  Lab 12/08/19 0804 12/08/19 0857  GLUCAP 203* 223*   Lipid Profile: No results for input(s): CHOL, HDL, LDLCALC, TRIG, CHOLHDL, LDLDIRECT in the last 72 hours. Thyroid Function Tests: Recent Labs    12/08/19 0430  TSH 3.408   Anemia Panel: No results for input(s): VITAMINB12, FOLATE, FERRITIN, TIBC, IRON, RETICCTPCT in the last 72 hours. Sepsis Labs: Recent Labs  Lab 12/07/19 2248 12/08/19 0510  LATICACIDVEN 3.1* 2.5*    Recent Results (from the past 240 hour(s))  Resp Panel by RT PCR (RSV, Flu A&B, Covid) - Nasopharyngeal Swab     Status: None   Collection Time: 12/08/19  3:45 AM   Specimen:  Nasopharyngeal Swab  Result Value Ref Range Status   SARS Coronavirus 2 by RT PCR  NEGATIVE NEGATIVE Final    Comment: (NOTE) SARS-CoV-2 target nucleic acids are NOT DETECTED.  The SARS-CoV-2 RNA is generally detectable in upper respiratoy specimens during the acute phase of infection. The lowest concentration of SARS-CoV-2 viral copies this assay can detect is 131 copies/mL. A negative result does not preclude SARS-Cov-2 infection and should not be used as the sole basis for treatment or other patient management decisions. A negative result may occur with  improper specimen collection/handling, submission of specimen other than nasopharyngeal swab, presence of viral mutation(s) within the areas targeted by this assay, and inadequate number of viral copies (<131 copies/mL). A negative result must be combined with clinical observations, patient history, and epidemiological information. The expected result is Negative.  Fact Sheet for Patients:  PinkCheek.be  Fact Sheet for Healthcare Providers:  GravelBags.it  This test is no t yet approved or cleared by the [place] FDA and  has been authorized for detection and/or diagnosis of SARS-CoV-2 by FDA under an Emergency Use Authorization (EUA). This EUA will remain  in effect (meaning this test can be used) for the duration of the COVID-19 declaration under Section 564(b)(1) of the Act, 21 U.S.C. section 360bbb-3(b)(1), unless the authorization is terminated or revoked sooner.     Influenza A by PCR NEGATIVE NEGATIVE Final   Influenza B by PCR NEGATIVE NEGATIVE Final    Comment: (NOTE) The Xpert Xpress SARS-CoV-2/FLU/RSV assay is intended as an aid in  the diagnosis of influenza from Nasopharyngeal swab specimens and  should not be used as a sole basis for treatment. Nasal washings and  aspirates are unacceptable for Xpert Xpress SARS-CoV-2/FLU/RSV  testing.  Fact Sheet  for Patients: PinkCheek.be  Fact Sheet for Healthcare Providers: GravelBags.it  This test is not yet approved or cleared by the [place] FDA and  has been authorized for detection and/or diagnosis of SARS-CoV-2 by  FDA under an Emergency Use Authorization (EUA). This EUA will remain  in effect (meaning this test can be used) for the duration of the  Covid-19 declaration under Section 564(b)(1) of the Act, 21  U.S.C. section 360bbb-3(b)(1), unless the authorization is  terminated or revoked.    Respiratory Syncytial Virus by PCR NEGATIVE NEGATIVE Final    Comment: (NOTE) Fact Sheet for Patients: PinkCheek.be  Fact Sheet for Healthcare Providers: GravelBags.it  This test is not yet approved or cleared by the [place] FDA and  has been authorized for detection and/or diagnosis of SARS-CoV-2 by  FDA under an Emergency Use Authorization (EUA). This EUA will remain  in effect (meaning this test can be used) for the duration of the  COVID-19 declaration under Section 564(b)(1) of the Act, 21 U.S.C.  section 360bbb-3(b)(1), unless the authorization is terminated or  revoked. Performed at [facility], [place] [address], [place], [place] [address]      Radiology Studies: DG Chest 2 View  Result Date: 12/07/2019 CLINICAL DATA:  Fever and weakness today. Symptoms worsening despite antibiotics. EXAM: CHEST - 2 VIEW COMPARISON:  11/04/2019 FINDINGS: Shallow inspiration. Cardiac enlargement. Slight interstitial pattern to the lungs is similar to prior study, possibly representing fibrosis or edema. No focal consolidation. No pleural effusions. No pneumothorax. Mediastinal contours appear intact. Calcified and tortuous aorta. Old right rib fractures. Degenerative changes in the spine. IMPRESSION: Cardiac enlargement. Slight interstitial pattern to the lungs suggesting  fibrosis or edema. No focal consolidation. Electronically Signed   By: Lucienne Capers M.D.   On: 12/07/2019 23:17  LOS: 0 days   Antonieta Pert, MD [facility]  12/08/2019, 9:22 AM

## 2019-12-08 NOTE — H&P (Addendum)
History and Physical    Miranda Wilkinson QJF:[id number] DOB: [birth year] DOA: 12/07/2019  PCP: Caryl Bis, MD Patient coming from: Home  Chief Complaint: Fever, generalized weakness  HPI: Miranda Wilkinson is a 84 y.o. female with medical history significant of persistent atrial fibrillation on Tikosyn and apixaban, iron deficiency anemia, type 2 diabetes, GERD, hypertension, hyperlipidemia, hypothyroidism, OSA on CPAP, CKD stage III presenting to the ED via EMS for patient with fever, generalized weakness, and diarrhea.  It is reported that she was diagnosed with a UTI by her PCP yesterday morning.  Temperature 100.1 F and heart rate 140-160 with EMS.  She was given Tylenol 1 g and 500 cc IV fluid.  Patient reports having generalized weakness and dysuria.  Denies chest pain.  Reports having shortness of breath.  Denies nausea, vomiting, or abdominal pain.  History provided mostly by daughter at bedside who states since yesterday patient has been feeling weak and has had 4 episodes of diarrhea.  Her temperature was 100.3 F.  She has had poor oral intake.  She was seen by her PCP yesterday morning and diagnosed with a UTI for which she was prescribed an oral antibiotic.  Daughter states patient's A. fib was under control and she was recently seen by her cardiologist on 9/27, however, since yesterday she is back in A. fib.  She has been vaccinated against Covid.  ED Course: In A. fib with RVR with rate in the 120s.  Tachypneic with respiratory rate in the 20s.  Not hypotensive.  Not hypoxic.  Covid testing not done in the ED.  WBC 15.4, hemoglobin 11.8 (at baseline), hematocrit 38.1, platelet 194K.  Sodium 147, potassium 3.5, chloride 95, bicarb 19, BUN 25, creatinine 1.8 (baseline 1.0), glucose 246, anion gap 13, LFTs normal, lactic acid 3.1, INR 2.2.  UA pending.  Blood culture x2 pending.  Chest x-ray showing cardiac enlargement and slight interstitial pattern to the lungs suggesting fibrosis or edema;  no focal consolidation.  Patient was given ceftriaxone and 500 cc normal saline bolus.  Review of Systems:  All systems reviewed and apart from history of presenting illness, are negative.  Past Medical History:  Diagnosis Date  . Allergy    seasonal allergies  . Anemia    IDA- on iron supplement  . Anxiety   . Atrial fibrillation ([place])   . Blood transfusion without reported diagnosis    2021- due to IDA/ low hemo (7.3)  . Cancer ([place]) 1976   bilaterl thyroid ca  . Diabetes mellitus without complication ([place])    on meds  . GERD (gastroesophageal reflux disease)    on meds---OTC  . Headache(784.0)    hx migraines  . Hyperlipidemia    on meds  . Hypertension    on meds  . Hypothyroidism    thyroidectomy in 1976- on meds at  this time  . Shortness of breath   . Sinus congestion   . Sleep apnea    study done in [place], [place], use CPAP----with home O2 concentrater at night with CPAP    Past Surgical History:  Procedure Laterality Date  . APPENDECTOMY    . CARDIOVERSION N/A 10/27/2019   Procedure: CARDIOVERSION;  Surgeon: [name], MD;  Location: [facility] ENDOSCOPY;  Service: Cardiovascular;  Laterality: N/A;  . CHOLECYSTECTOMY    . OPEN REDUCTION INTERNAL FIXATION (ORIF) TIBIA/FIBULA FRACTURE Right 11/03/2013   Procedure: OPEN REDUCTION INTERNAL FIXATION (ORIF) RIGHT TIBIA/FIBULA PILON FRACTURE;  Surgeon: Wylene Simmer, MD;  Location:  [facility] OR;  Service: Orthopedics;  Laterality: Right;  . OVARIAN CYST SURGERY Left    age 77  . THYROIDECTOMY Bilateral 1976  . TUBAL LIGATION       reports that she quit smoking about 8 years ago. Her smoking use included cigarettes. She has a 90.00 pack-year smoking history. She has never used smokeless tobacco. She reports that she does not drink alcohol and does not use drugs.  No Known Allergies  Family History  Problem Relation Age of Onset  . Stroke Mother        brain hemmorhage  . Diabetes Mother   . Heart disease Mother   .  Thyroid disease Mother   . Arrhythmia Mother   . Stroke Father   . Heart attack Father        blood clot in brain  . Stroke Sister   . Cancer Sister   . Stomach cancer Brother 36  . Hypertension Brother   . Esophageal cancer Brother 70  . Stroke Brother   . Hypertension Brother   . Arrhythmia Sister   . Hypertension Sister   . Colon polyps Neg Hx   . Colon cancer Neg Hx   . Rectal cancer Neg Hx     Prior to Admission medications   Medication Sig Start Date End Date Taking? Authorizing Provider  acetaminophen (TYLENOL) 500 MG tablet Take 500-1,000 mg by mouth every 6 (six) hours as needed (for pain.).    [provider]  B Complex Vitamins (B COMPLEX 50) TABS daily.  10/28/19   [provider]  bisoprolol (ZEBETA) 10 MG tablet Take 1 tablet (10 mg total) by mouth daily. 10/28/19   Shirley Friar, PA-C  cetirizine (ZYRTEC) 10 MG tablet Take 10 mg by mouth daily as needed for allergies.     [provider]  Cholecalciferol (VITAMIN D3) 50 MCG (2000 UT) capsule Take 2,000 Units by mouth daily.    [provider]  dofetilide (TIKOSYN) 500 MCG capsule Take 1 capsule (500 mcg total) by mouth 2 (two) times daily. (0900 & 2100) 12/05/19   Vickie Epley, MD  ELIQUIS 5 MG TABS tablet TAKE 1 TABLET TWICE A DAY Patient taking differently: Take 5 mg by mouth 2 (two) times daily. (0900 & 2100) 08/09/19   Branch, Alphonse Guild, MD  glipiZIDE (GLUCOTROL) 10 MG tablet Take 10 mg by mouth daily.     [provider]  iron polysaccharides (NU-IRON) 150 MG capsule Take 1 capsule (150 mg total) by mouth daily. 11/05/19   Mosetta Anis, MD  levothyroxine (SYNTHROID) 112 MCG tablet Take 112 mcg by mouth daily.     [provider]  losartan (COZAAR) 100 MG tablet Take 1 tablet (100 mg total) by mouth daily. 11/03/19   Fenton, Clint R, PA  Magnesium 250 MG TABS Take 250 mg by mouth every other day. In the morning.    [provider]  melatonin  5 MG TABS Take 1.25-5 mg by mouth at bedtime as needed (sleep).     [provider]  metFORMIN (GLUCOPHAGE-XR) 500 MG 24 hr tablet Take 1,000 mg by mouth daily with supper.    [provider]  Multiple Vitamins-Minerals (MULTIVITAMIN WITH MINERALS) tablet Take 1 tablet by mouth daily.    [provider]  Omeprazole 20 MG TBDD  10/28/19   [provider]  POTASSIUM GLUCONATE PO Take 595 mg by mouth every other day.    [provider]  simvastatin (  ZOCOR) 40 MG tablet Take 20 mg by mouth daily with supper.     [provider]  umeclidinium-vilanterol (ANORO ELLIPTA) 62.5-25 MCG/INH AEPB Only open the device one time and then take your two separate drags to be sure you get it all 09/26/19   Tanda Rockers, MD    Physical Exam: Vitals:   12/07/19 2250 12/08/19 0245 12/08/19 0300 12/08/19 0426  BP: 120/74 (!) 124/108 140/78   Pulse: 88 (!) 44 66   Resp: 20 (!) 27 (!) 28   Temp:      TempSrc:      SpO2: 98% 95% 100% 94%  Weight:      Height:        Physical Exam Constitutional:      General: She is not in acute distress. HENT:     Head: Normocephalic.     Mouth/Throat:     Mouth: Mucous membranes are dry.  Eyes:     Extraocular Movements: Extraocular movements intact.     Conjunctiva/sclera: Conjunctivae normal.  Cardiovascular:     Rate and Rhythm: Normal rate and regular rhythm.     Pulses: Normal pulses.  Pulmonary:     Effort: Pulmonary effort is normal.     Breath sounds: No wheezing.  Abdominal:     General: Bowel sounds are normal. There is no distension.     Palpations: Abdomen is soft.     Tenderness: There is no abdominal tenderness.  Musculoskeletal:     Cervical back: Normal range of motion and neck supple.     Right lower leg: Edema present.     Left lower leg: Edema present.     Comments: Trace bilateral lower extremity edema  Skin:    General: Skin is warm and dry.  Neurological:     Mental Status: She is  alert and oriented to person, place, and time. Mental status is at baseline.     Labs on Admission: I have personally reviewed following labs and imaging studies  CBC: Recent Labs  Lab 12/07/19 2344  WBC 15.4*  NEUTROABS 13.8*  HGB 11.8*  HCT 38.1  MCV 90.1  PLT 401   Basic Metabolic Panel: Recent Labs  Lab 12/07/19 2344  NA 127*  K 3.5  CL 95*  CO2 19*  GLUCOSE 246*  BUN 25*  CREATININE 1.89*  CALCIUM 9.0   GFR: Estimated Creatinine Clearance: 26.8 mL/min (A) (by C-G formula based on SCr of 1.89 mg/dL (H)). Liver Function Tests: Recent Labs  Lab 12/07/19 2344  AST 27  ALT 20  ALKPHOS 59  BILITOT 1.1  PROT 6.9  ALBUMIN 2.8*   No results for input(s): LIPASE, AMYLASE in the last 168 hours. No results for input(s): AMMONIA in the last 168 hours. Coagulation Profile: Recent Labs  Lab 12/07/19 2344  INR 2.2*   Cardiac Enzymes: No results for input(s): CKTOTAL, CKMB, CKMBINDEX, TROPONINI in the last 168 hours. BNP (last 3 results) No results for input(s): PROBNP in the last 8760 hours. HbA1C: No results for input(s): HGBA1C in the last 72 hours. CBG: No results for input(s): GLUCAP in the last 168 hours. Lipid Profile: No results for input(s): CHOL, HDL, LDLCALC, TRIG, CHOLHDL, LDLDIRECT in the last 72 hours. Thyroid Function Tests: No results for input(s): TSH, T4TOTAL, FREET4, T3FREE, THYROIDAB in the last 72 hours. Anemia Panel: No results for input(s): VITAMINB12, FOLATE, FERRITIN, TIBC, IRON, RETICCTPCT in the last 72 hours. Urine analysis:    Component Value Date/Time  COLORURINE AMBER (A) 12/08/2019 0423   APPEARANCEUR CLOUDY (A) 12/08/2019 0423   LABSPEC 1.014 12/08/2019 0423   PHURINE 5.0 12/08/2019 0423   GLUCOSEU NEGATIVE 12/08/2019 0423   HGBUR LARGE (A) 12/08/2019 0423   BILIRUBINUR NEGATIVE 12/08/2019 0423   KETONESUR NEGATIVE 12/08/2019 0423   PROTEINUR 100 (A) 12/08/2019 0423   NITRITE NEGATIVE 12/08/2019 0423   LEUKOCYTESUR  MODERATE (A) 12/08/2019 0423    Radiological Exams on Admission: DG Chest 2 View  Result Date: 12/07/2019 CLINICAL DATA:  Fever and weakness today. Symptoms worsening despite antibiotics. EXAM: CHEST - 2 VIEW COMPARISON:  11/04/2019 FINDINGS: Shallow inspiration. Cardiac enlargement. Slight interstitial pattern to the lungs is similar to prior study, possibly representing fibrosis or edema. No focal consolidation. No pleural effusions. No pneumothorax. Mediastinal contours appear intact. Calcified and tortuous aorta. Old right rib fractures. Degenerative changes in the spine. IMPRESSION: Cardiac enlargement. Slight interstitial pattern to the lungs suggesting fibrosis or edema. No focal consolidation. Electronically Signed   By: Lucienne Capers M.D.   On: 12/07/2019 23:17    EKG: Not done in the ED.  Assessment/Plan Principal Problem:   Severe sepsis (HCC) Active Problems:   Essential hypertension, benign   Diarrhea   AKI (acute kidney injury) ([place])   Atrial fibrillation with rapid ventricular response (HCC)   Severe sepsis: Meets criteria for severe sepsis including fever, tachycardia, tachypnea, leukocytosis, and lactic acidosis.  UA currently pending but daughter states that patient was diagnosed with a UTI by her PCP yesterday morning.  Chest x-ray not suggestive of pneumonia. -Received 500 cc fluid bolus by EMS.  Not hypotensive but continues to be tachycardic.  Currently receiving additional 500 cc fluid bolus.  Continue ceftriaxone.  Tylenol as needed for fevers.  UA and blood cultures pending.  Trend lactate.  Monitor WBC count.  Order SARS-CoV-2 PCR test and influenza panel.  Diarrhea: Does have leukocytosis on labs.  Abdominal exam benign. -GI pathogen panel, C. difficile PCR  A. fib with RVR: Likely precipitating factor is infection/severe sepsis.  She was in sinus rhythm during recent cardiology visit on 9/27.  Currently in A. fib with rate in the 140s to 150s.  Per daughter,  she has already received her evening dose of Tikosyn. -Cardiac monitoring.  Order EKG.  Check TSH level.  Continue treatment for severe sepsis/suspected UTI.  Resume Tikosyn and apixaban after pharmacy med rec is done.  Blood pressure stable, order IV metoprolol PRN.  Discussed with on-call cardiologist who agrees that A. fib is likely precipitated by underlying infection.  He is recommending continuing Tikosyn and giving PRN metoprolol.  If she continues to be in A. fib during this hospitalization, reconsult cardiology as she will likely need cardioversion.  Abnormal chest x-ray finding: Chest x-ray showing cardiac enlargement and slight interstitial pattern to the lungs suggesting fibrosis or edema.  Patient does not appear significantly volume overloaded on exam.  Previous echo done December 2020 showing normal LVEF of 70-75% and indeterminate diastolic parameters. -Check BNP level  AKI on CKD stage III: BUN 25, creatinine 1.8 (baseline 1.0).  Possibly prerenal from dehydration. -Continue gentle IV fluid hydration.  Monitor renal function and urine output.  Avoid nephrotoxic agents.  Normal anion gap metabolic acidosis: Bicarb 19, anion gap 13.  Likely due to AKI. -Gentle IV fluid hydration and monitor BMP.  Iron deficiency anemia: Hemoglobin at baseline. -Resume home iron supplement after pharmacy med rec is done.  Noninsulin-dependent type 2 diabetes -Check A1c.  Sliding scale insulin sensitive  ACHS and CBG checks.  Hypertension -Hold antihypertensives at this time given concern for severe sepsis  Hyperlipidemia -Resume statin after pharmacy med rec exam.  Hypothyroidism -Resume Synthroid after pharmacy med rec is done.  Check TSH level.  OSA -Continue CPAP at night  DVT prophylaxis: Resume apixaban after pharmacy med rec is done. Code Status: Patient wishes to be full code. Family Communication: Daughter at bedside. Disposition Plan: Status is: Observation  The patient  remains OBS appropriate and will d/c before 2 midnights.  Dispo: The patient is from: Home              Anticipated d/c is to: Home              Anticipated d/c date is: 2 days              Patient currently is not medically stable to d/c.  The medical decision making on this patient was of high complexity and the patient is at high risk for clinical deterioration, therefore this is a level 3 visit.  Shela Leff MD [facility]  If 7PM-7AM, please contact night-coverage [email or web address]  12/08/2019, 4:39 AM

## 2019-12-09 ENCOUNTER — Telehealth: Payer: Self-pay | Admitting: Cardiology

## 2019-12-09 ENCOUNTER — Inpatient Hospital Stay (HOSPITAL_COMMUNITY): Payer: Medicare Other

## 2019-12-09 DIAGNOSIS — I4891 Unspecified atrial fibrillation: Secondary | ICD-10-CM

## 2019-12-09 LAB — BASIC METABOLIC PANEL
Anion gap: 11 (ref 5–15)
BUN: 25 mg/dL — ABNORMAL HIGH (ref 8–23)
CO2: 22 mmol/L (ref 22–32)
Calcium: 9.3 mg/dL (ref 8.9–10.3)
Chloride: 100 mmol/L (ref 98–111)
Creatinine, Ser: 1.73 mg/dL — ABNORMAL HIGH (ref 0.44–1.00)
GFR calc Af Amer: 31 mL/min — ABNORMAL LOW (ref >60)
GFR calc non Af Amer: 27 mL/min — ABNORMAL LOW (ref >60)
Glucose, Bld: 75 mg/dL (ref 70–99)
Potassium: 3.9 mmol/L (ref 3.5–5.1)
Sodium: 133 mmol/L — ABNORMAL LOW (ref 135–145)

## 2019-12-09 LAB — ECHOCARDIOGRAM COMPLETE
AR max vel: 1.18 cm2
AV Peak grad: 25.8 mmHg
Ao pk vel: 2.54 m/s
Area-P 1/2: 3.21 cm2
Calc EF: 60.5 %
Height: 65 in
S' Lateral: 2.5 cm
Single Plane A2C EF: 62.9 %
Single Plane A4C EF: 61.6 %
Weight: 3717.84 oz

## 2019-12-09 LAB — CBC
HCT: 37.1 % (ref 36.0–46.0)
Hemoglobin: 11.4 g/dL — ABNORMAL LOW (ref 12.0–15.0)
MCH: 27.9 pg (ref 26.0–34.0)
MCHC: 30.7 g/dL (ref 30.0–36.0)
MCV: 90.7 fL (ref 80.0–100.0)
Platelets: 190 10*3/uL (ref 150–400)
RBC: 4.09 MIL/uL (ref 3.87–5.11)
WBC: 10.4 10*3/uL (ref 4.0–10.5)
nRBC: 0 % (ref 0.0–0.2)

## 2019-12-09 LAB — GLUCOSE, CAPILLARY
Glucose-Capillary: 93 mg/dL (ref 70–99)
Glucose-Capillary: 95 mg/dL (ref 70–99)
Glucose-Capillary: 129 mg/dL — ABNORMAL HIGH (ref 70–99)
Glucose-Capillary: 149 mg/dL — ABNORMAL HIGH (ref 70–99)

## 2019-12-09 LAB — GASTROINTESTINAL PANEL BY PCR, STOOL (REPLACES STOOL CULTURE)

## 2019-12-09 LAB — MRSA PCR SCREENING: MRSA by PCR: POSITIVE — AB

## 2019-12-09 LAB — URINE CULTURE: Culture: <10000 — AB

## 2019-12-09 LAB — C DIFFICILE QUICK SCREEN W PCR REFLEX
C Diff antigen: NEGATIVE
C Diff interpretation: NOT DETECTED
C Diff toxin: NEGATIVE

## 2019-12-09 LAB — BRAIN NATRIURETIC PEPTIDE: B Natriuretic Peptide: 714.3 pg/mL — ABNORMAL HIGH (ref 0.0–100.0)

## 2019-12-09 MED ORDER — DILTIAZEM HCL-DEXTROSE 125-5 MG/125ML-% IV SOLN (PREMIX)
5.0000 mg/h | INTRAVENOUS | Status: DC
  Administered 2019-12-09: 5 mg/h via INTRAVENOUS
  Filled 2019-12-09 (×2): qty 125

## 2019-12-09 MED ORDER — ALBUTEROL SULFATE HFA 108 (90 BASE) MCG/ACT IN AERS
2.0000 | INHALATION_SPRAY | Freq: Four times a day (QID) | RESPIRATORY_TRACT | Status: DC | PRN
  Administered 2019-12-09: 2 via RESPIRATORY_TRACT
  Filled 2019-12-09: qty 6.7

## 2019-12-09 MED ORDER — DILTIAZEM HCL 60 MG PO TABS
60.0000 mg | ORAL_TABLET | Freq: Four times a day (QID) | ORAL | Status: DC
  Administered 2019-12-09 (×2): 60 mg via ORAL
  Filled 2019-12-09 (×2): qty 1

## 2019-12-09 MED ORDER — CHLORHEXIDINE GLUCONATE CLOTH 2 % EX PADS
6.0000 | MEDICATED_PAD | Freq: Every day | CUTANEOUS | Status: DC
  Administered 2019-12-10 – 2019-12-13 (×4): 6 via TOPICAL

## 2019-12-09 MED ORDER — MUPIROCIN 2 % EX OINT
1.0000 "application " | TOPICAL_OINTMENT | Freq: Two times a day (BID) | CUTANEOUS | Status: DC
  Administered 2019-12-09 – 2019-12-13 (×9): 1 via NASAL
  Filled 2019-12-09: qty 22

## 2019-12-09 MED ORDER — ALBUTEROL SULFATE HFA 108 (90 BASE) MCG/ACT IN AERS
2.0000 | INHALATION_SPRAY | Freq: Three times a day (TID) | RESPIRATORY_TRACT | Status: DC
  Administered 2019-12-09: 2 via RESPIRATORY_TRACT
  Filled 2019-12-09 (×2): qty 6.7

## 2019-12-09 NOTE — Progress Notes (Signed)
  Echocardiogram 2D Echocardiogram has been performed.  Miranda Wilkinson 12/09/2019, 1:53 PM

## 2019-12-09 NOTE — Progress Notes (Signed)
PROGRESS NOTE    Miranda Wilkinson  NKN:[id number] DOB: 1935-09-27 DOA: 12/07/2019 PCP: Caryl Bis, MD   Chief Complaint  Patient presents with  . Fever  . Dysuria   Brief Narrative: 84 y.o. female with  persistent atrial fibrillation on Tikosyn and apixaban, IDA,T2DM,GERD,HTN,HLD,hypothyroidism, OSA on CPAP,CKD stage III presenting to the ED via EMS with fever, generalized weakness and diarrhea. She was diagnosed with UTI by her PCP 9:29 AM and was restarted on oral antibiotics. She was given 500 cc by EMS and also received 500 mL in the ED. In the ED A. fib with RVR heart rate in 120s tachypneic, bp soft. WBC 15.4, creatinine 1.8 (baseline 1.0), glucose 246, anion gap 13, LFTs normal, lactic acid 3.1, INR 2.2. Sodium 127,UA grossly abnormal RBC more than 50 WBC more than 50, leukocytes moderate nitrate negative cloudy.Blood culture x2 sent, COVID-19 negative, CXR " cardiac enlargement and slight interstitial pattern to the lungs suggesting fibrosis or edema; no focal consolidation". She was admitted with severe sepsis, A. fib with RVR. Seen by cardiology placed on Cardizem drip  Subjective: Seen this morning feels much better today.  On 2 L nasal cannula.  Heart rate controlled  on Cardizem drip. No audible wheezing like yesterday. T-max 101.8 yesterday 6 PM. Labs shows leukocytosis resolved, creatinine downtrending 1.7  Assessment & Plan:  Severe sepsis with UTI POA, with lactic acidosis: Hemodynamically stable except for A. fib with RVR on Cardizem infusion.  Lactic acidosis resolved.  Febrile 6 PM yesterday, on ceftriaxone 2 g, urine culture less than 10,000 colonies, Covid 19 and RVP panel negative, blood culture negative so far.  Shortness of breath/wheezing/acute hypoxic respiratory failure: PFT with moderate obstructive disease. Continue albuterol.  Her breathing started to get worse after heart rate increased with A. Fib.  CXR repeat 9/30- 'similar mild cardiac enlargement  and chronic bronchitic and interstitial prominence", abg with mild hypoxia.  Also triggered by anxiety, wheezing is mostly coming from upper airway.  Wheezing is much improved today.  Persistent atrial fibrillation, we are precipitated by UTI sepsis: On Tikosyn currently on hold due to renal dysfunction, on Eliquis dose appropriately reduced for renal dysfunction.  EF 62% on bedside echo.  TSH 3.4.  Continue cardizem gtt as per cardiology-awaiting echocardiogram.  Abnormal chest x-ray with slight interstitial pattern fibrosis or edema BNP up to 755 but with normal LVEF-could be due to rapid A. Fib.  Hyponatremia: Mild, likely hypovolemic from poor oral intake.    AKI on CKD stage IIIa/normal anion gap metabolic acidosis, baseline creatinine around 1.0.Creatinine 1.8-1.9 likely from volume depletion poor oral intake and diarrhea at home.  Received IV fluids on admission currently holding due to wheezing, questionable fluid overload.  Encourage oral intake, creatinine downtrending.  Monitor. Recent Labs  Lab 12/07/19 2344 12/08/19 0500 12/09/19 0021  BUN 25* 28* 25*  CREATININE 1.89* 1.90* 1.73*   T2DM,HbA1c 5.1,holding home Metformin and glipizide.  Blood sugar is controlled on sliding scale insulin. Marland Kitchen Recent Labs  Lab 12/08/19 0857 12/08/19 1310 12/08/19 1643 12/08/19 2132 12/09/19 0627  GLUCAP 223* 122* 105* 74 93   Hypertension: BP stable on Cardizem drip.  Holding home losartan and bisoprolol.   HLD: Continue statin.   Hypothyroidism continue home Synthroid.TSH 3.4 euthyroid.  IDA on iron supplementation continue the same.  Hemoglobin is stable, she has a GI follow-up for EGD and colonoscopy in October with [facility] based upon previous anemia.  She was hospitalized recently needing blood transfusion for  hemoglobin of 4.4 g  OSA CPAP at night.  Morbid obesity with BMI 36:Will benefit with weight loss and healthy lifestyle.  DVT prophylaxis: apixaban (ELIQUIS) tablet 2.5 mg  Start: 12/08/19 1000 Code Status:   Code Status: Full Code  Family Communication: plan of care discussed with patient .  I had discussion with patient's daughter multiple times yesterday in the ED.    Status YF:[id number] ad  Observation Patient remains hospitalized for ongoing management of AKI, severe sepsis, A. fib with RVR at least 2 midnight stay.  Dispo: The patient is from: Home              Anticipated d/c is to: Home consult PT OT once heart rate is better for further disposition, discussed nursing staff.              Anticipated d/c date is: 2 days              Patient currently is not medically stable to d/c.  Nutrition: Diet Order            Diet heart healthy/carb modified Room service appropriate? Yes; Fluid consistency: Thin  Diet effective now                 Body mass index is 38.67 kg/m.  Consultants:see note  Procedures:see note Microbiology:see note Blood Culture    Component Value Date/Time   SDES URINE, RANDOM 12/08/2019 0427   SPECREQUEST NONE 12/08/2019 0427   CULT (A) 12/08/2019 0427    <10,000 COLONIES/mL INSIGNIFICANT GROWTH Performed at [facility] [address] [place], [place] [address]    REPTSTATUS 12/09/2019 FINAL 12/08/2019 0427    Other culture-see note  Medications: Scheduled Meds: . albuterol  2 puff Inhalation TID  . apixaban  2.5 mg Oral BID  . Chlorhexidine Gluconate Cloth  6 each Topical Q0600  . cholecalciferol  2,000 Units Oral Daily  . insulin aspart  0-5 Units Subcutaneous QHS  . insulin aspart  0-9 Units Subcutaneous TID WC  . iron polysaccharides  150 mg Oral Daily  . levothyroxine  112 mcg Oral Q0600  . loratadine  10 mg Oral Daily  . magnesium oxide  200 mg Oral QODAY  . multivitamin with minerals  1 tablet Oral Daily  . mupirocin ointment  1 application Nasal BID  . simvastatin  20 mg Oral Q supper   Continuous Infusions: . cefTRIAXone (ROCEPHIN)  IV    . diltiazem (CARDIZEM) infusion 12.5 mg/hr  (12/09/19 0006)    Antimicrobials: Anti-infectives (From admission, onward)   Start     Dose/Rate Route Frequency Ordered Stop   12/09/19 1000  cefTRIAXone (ROCEPHIN) 2 g in sodium chloride 0.9 % 100 mL IVPB        2 g 200 mL/hr over 30 Minutes Intravenous Daily 12/08/19 0755     12/09/19 0600  cefTRIAXone (ROCEPHIN) 1 g in sodium chloride 0.9 % 100 mL IVPB  Status:  Discontinued        1 g 200 mL/hr over 30 Minutes Intravenous Daily 12/08/19 0354 12/08/19 0755   12/08/19 0800  cefTRIAXone (ROCEPHIN) 1 g in sodium chloride 0.9 % 100 mL IVPB        1 g 200 mL/hr over 30 Minutes Intravenous  Once 12/08/19 0755 12/08/19 0955   12/08/19 0315  cefTRIAXone (ROCEPHIN) 1 g in sodium chloride 0.9 % 100 mL IVPB        1 g 200 mL/hr over 30 Minutes Intravenous  Once 12/08/19 0308 12/08/19 0440     Objective: Vitals: Today's Vitals   12/08/19 1930 12/08/19 1940 12/08/19 2341 12/09/19 0441  BP:  117/60 135/82 118/60  Pulse:  94 (!) 106 (!) 104  Resp:  20 20 19   Temp:  98.7 F (37.1 C) 97.9 F (36.6 C) 97.8 F (36.6 C)  TempSrc:  Oral Oral Oral  SpO2:  95% 95% 96%  Weight:  105.4 kg    Height:  5\' 5"  (1.651 m)    PainSc: 0-No pain       Intake/Output Summary (Last 24 hours) at 12/09/2019 0728 Last data filed at 12/09/2019 0442 Gross per 24 hour  Intake --  Output 650 ml  Net -650 ml   Filed Weights   12/07/19 2249 12/08/19 1940  Weight: 99.8 kg 105.4 kg   Weight change: 5.609 kg  Intake/Output from previous day: 09/30 0701 - 10/01 0700 In: -  Out: 650 [Urine:650] Intake/Output this shift: No intake/output data recorded.  Examination General exam: AAO at baseline oriented to place, self her date of birth,, hard of hearing, obese, NAD, weak appearing. HEENT:Oral mucosa moist, Ear/Nose WNL grossly, dentition normal. Respiratory system: bilaterally diminished breath sounds, clear no wheezing or crackles,no use of accessory muscle Cardiovascular system: S1 & S2 +, irregularly  irregular, no JVD. Gastrointestinal system: Abdomen soft, NT,ND, BS+ Nervous System:Alert, awake, moving extremities and grossly nonfocal Extremities: No edema, distal peripheral pulses palpable.  Skin: No rashes,no icterus. MSK: Normal muscle bulk,tone, power  Data Reviewed: I have personally reviewed following labs and imaging studies CBC: Recent Labs  Lab 12/07/19 2344 12/08/19 0500 12/08/19 1513 12/09/19 0021  WBC 15.4* 13.1*  --  10.4  NEUTROABS 13.8*  --   --   --   HGB 11.8* 12.0 12.6 11.4*  HCT 38.1 39.5 37.0 37.1  MCV 90.1 90.2  --  90.7  PLT 194 205  --  130   Basic Metabolic Panel: Recent Labs  Lab 12/07/19 2344 12/08/19 0500 12/08/19 1513 12/09/19 0021  NA 127* 130* 132* 133*  K 3.5 4.3 3.7 3.9  CL 95* 96*  --  100  CO2 19* 20*  --  22  GLUCOSE 246* 162*  --  75  BUN 25* 28*  --  25*  CREATININE 1.89* 1.90*  --  1.73*  CALCIUM 9.0 8.9  --  9.3   GFR: Estimated Creatinine Clearance: 30.2 mL/min (A) (by C-G formula based on SCr of 1.73 mg/dL (H)). Liver Function Tests: Recent Labs  Lab 12/07/19 2344  AST 27  ALT 20  ALKPHOS 59  BILITOT 1.1  PROT 6.9  ALBUMIN 2.8*   No results for input(s): LIPASE, AMYLASE in the last 168 hours. No results for input(s): AMMONIA in the last 168 hours. Coagulation Profile: Recent Labs  Lab 12/07/19 2344  INR 2.2*   Cardiac Enzymes: No results for input(s): CKTOTAL, CKMB, CKMBINDEX, TROPONINI in the last 168 hours. BNP (last 3 results) No results for input(s): PROBNP in the last 8760 hours. HbA1C: Recent Labs    12/08/19 0510  HGBA1C 5.1   CBG: Recent Labs  Lab 12/08/19 0857 12/08/19 1310 12/08/19 1643 12/08/19 2132 12/09/19 0627  GLUCAP 223* 122* 105* 74 93   Lipid Profile: No results for input(s): CHOL, HDL, LDLCALC, TRIG, CHOLHDL, LDLDIRECT in the last 72 hours. Thyroid Function Tests: Recent Labs    12/08/19 0430  TSH 3.408   Anemia Panel: No results for input(s): VITAMINB12, FOLATE,  FERRITIN, TIBC, IRON, RETICCTPCT  in the last 72 hours. Sepsis Labs: Recent Labs  Lab 12/07/19 2248 12/08/19 0510 12/08/19 0930 12/08/19 1952  LATICACIDVEN 3.1* 2.5* 2.1* 1.1    Recent Results (from the past 240 hour(s))  Culture, blood (Routine x 2)     Status: None (Preliminary result)   Collection Time: 12/07/19 11:15 PM   Specimen: BLOOD RIGHT FOREARM  Result Value Ref Range Status   Specimen Description BLOOD RIGHT FOREARM  Final   Special Requests   Final    BOTTLES DRAWN AEROBIC AND ANAEROBIC Blood Culture adequate volume   Culture   Final    NO GROWTH 1 DAY Performed at [facility], [place] [address], [place], [place] [address]    Report Status PENDING  Incomplete  Resp Panel by RT PCR (RSV, Flu A&B, Covid) - Nasopharyngeal Swab     Status: None   Collection Time: 12/08/19  3:45 AM   Specimen: Nasopharyngeal Swab  Result Value Ref Range Status   SARS Coronavirus 2 by RT PCR NEGATIVE NEGATIVE Final    Comment: (NOTE) SARS-CoV-2 target nucleic acids are NOT DETECTED.  The SARS-CoV-2 RNA is generally detectable in upper respiratoy specimens during the acute phase of infection. The lowest concentration of SARS-CoV-2 viral copies this assay can detect is 131 copies/mL. A negative result does not preclude SARS-Cov-2 infection and should not be used as the sole basis for treatment or other patient management decisions. A negative result may occur with  improper specimen collection/handling, submission of specimen other than nasopharyngeal swab, presence of viral mutation(s) within the areas targeted by this assay, and inadequate number of viral copies (<131 copies/mL). A negative result must be combined with clinical observations, patient history, and epidemiological information. The expected result is Negative.  Fact Sheet for Patients:  PinkCheek.be  Fact Sheet for Healthcare Providers:   GravelBags.it  This test is no t yet approved or cleared by the [place] FDA and  has been authorized for detection and/or diagnosis of SARS-CoV-2 by FDA under an Emergency Use Authorization (EUA). This EUA will remain  in effect (meaning this test can be used) for the duration of the COVID-19 declaration under Section 564(b)(1) of the Act, 21 U.S.C. section 360bbb-3(b)(1), unless the authorization is terminated or revoked sooner.     Influenza A by PCR NEGATIVE NEGATIVE Final   Influenza B by PCR NEGATIVE NEGATIVE Final    Comment: (NOTE) The Xpert Xpress SARS-CoV-2/FLU/RSV assay is intended as an aid in  the diagnosis of influenza from Nasopharyngeal swab specimens and  should not be used as a sole basis for treatment. Nasal washings and  aspirates are unacceptable for Xpert Xpress SARS-CoV-2/FLU/RSV  testing.  Fact Sheet for Patients: PinkCheek.be  Fact Sheet for Healthcare Providers: GravelBags.it  This test is not yet approved or cleared by the [place] FDA and  has been authorized for detection and/or diagnosis of SARS-CoV-2 by  FDA under an Emergency Use Authorization (EUA). This EUA will remain  in effect (meaning this test can be used) for the duration of the  Covid-19 declaration under Section 564(b)(1) of the Act, 21  U.S.C. section 360bbb-3(b)(1), unless the authorization is  terminated or revoked.    Respiratory Syncytial Virus by PCR NEGATIVE NEGATIVE Final    Comment: (NOTE) Fact Sheet for Patients: PinkCheek.be  Fact Sheet for Healthcare Providers: GravelBags.it  This test is not yet approved or cleared by the [place] FDA and  has been authorized for detection and/or diagnosis of SARS-CoV-2 by  FDA under an Emergency Use Authorization (EUA). This EUA will remain  in effect (meaning this test can be  used) for the duration of the  COVID-19 declaration under Section 564(b)(1) of the Act, 21 U.S.C.  section 360bbb-3(b)(1), unless the authorization is terminated or  revoked. Performed at [facility], [place] [address], [place], [place] [address]   Urine culture     Status: Abnormal   Collection Time: 12/08/19  4:27 AM   Specimen: Urine, Random  Result Value Ref Range Status   Specimen Description URINE, RANDOM  Final   Special Requests NONE  Final   Culture (A)  Final    <10,000 COLONIES/mL INSIGNIFICANT GROWTH Performed at [facility], [place] [address], [place], [place] [address]    Report Status 12/09/2019 FINAL  Final  MRSA PCR Screening     Status: Abnormal   Collection Time: 12/08/19  7:55 PM   Specimen: Nasal Mucosa; Nasopharyngeal  Result Value Ref Range Status   MRSA by PCR POSITIVE (A) NEGATIVE Final    Comment:        The GeneXpert MRSA Assay (FDA approved for NASAL specimens only), is one component of a comprehensive MRSA colonization surveillance program. It is not intended to diagnose MRSA infection nor to guide or monitor treatment for MRSA infections. RESULT CALLED TO, READ BACK BY AND VERIFIED WITH: Alice Rieger RN 12/09/19 AT 0025 SK Performed at [facility], [place] [address], [place], [place] [address]      Radiology Studies: DG Chest 2 View  Result Date: 12/07/2019 CLINICAL DATA:  Fever and weakness today. Symptoms worsening despite antibiotics. EXAM: CHEST - 2 VIEW COMPARISON:  11/04/2019 FINDINGS: Shallow inspiration. Cardiac enlargement. Slight interstitial pattern to the lungs is similar to prior study, possibly representing fibrosis or edema. No focal consolidation. No pleural effusions. No pneumothorax. Mediastinal contours appear intact. Calcified and tortuous aorta. Old right rib fractures. Degenerative changes in the spine. IMPRESSION: Cardiac enlargement. Slight interstitial pattern to the lungs suggesting fibrosis or edema. No focal  consolidation. Electronically Signed   By: Lucienne Capers M.D.   On: 12/07/2019 23:17   US RENAL  Result Date: 12/08/2019 CLINICAL DATA:  UTI. EXAM: RENAL / URINARY TRACT ULTRASOUND COMPLETE COMPARISON:  None. FINDINGS: Right Kidney: Renal measurements: 12.3 x 5.0 x 5.4 cm = volume: 174 mL. Echogenicity within normal limits. There is a 3.3 cm simple cyst from the inferior kidney. No solid mass or hydronephrosis visualized. No perirenal fluid collection. Left Kidney: Renal measurements: 13.8 x 6.3 x 5.8 cm = volume: 261 mL. Echogenicity within normal limits. No mass or hydronephrosis visualized. No perirenal fluid collection. Bladder: Appears normal for degree of bladder distention. Other: None. IMPRESSION: Simple cyst in the right kidney. Otherwise unremarkable renal ultrasound. Electronically Signed   By: Keith Rake M.D.   On: 12/08/2019 20:35   DG Chest Port 1 View  Result Date: 12/08/2019 CLINICAL DATA:  Shortness of breath and fever. EXAM: PORTABLE CHEST 1 VIEW COMPARISON:  12/07/2019 FINDINGS: No focal consolidation. Similar mild diffuse bronchitic and interstitial prominence. Low lung volumes. No visible pleural effusion or pneumothorax. Mild enlargement of the cardiac silhouette, similar to prior. Calcific atherosclerosis of the aorta IMPRESSION: 1. No evidence of acute cardiopulmonary abnormality. 2. Similar mild cardiac enlargement and chronic bronchitic and interstitial prominence. Electronically Signed   By: Margaretha Sheffield MD   On: 12/08/2019 14:10     LOS: 1 day   Antonieta Pert, MD [facility]  12/09/2019, 7:28 AM

## 2019-12-09 NOTE — Progress Notes (Signed)
Progress Note  Patient Name: Miranda Wilkinson Date of Encounter: 12/09/2019  [facility] Cardiologist: Carlyle Dolly, MD  EP: Dr. Quentin Ore  Subjective   Breathing and HR improved. No chest pain or palpitations.   Inpatient Medications    Scheduled Meds: . apixaban  2.5 mg Oral BID  . Chlorhexidine Gluconate Cloth  6 each Topical Q0600  . cholecalciferol  2,000 Units Oral Daily  . insulin aspart  0-5 Units Subcutaneous QHS  . insulin aspart  0-9 Units Subcutaneous TID WC  . iron polysaccharides  150 mg Oral Daily  . levothyroxine  112 mcg Oral Q0600  . loratadine  10 mg Oral Daily  . magnesium oxide  200 mg Oral QODAY  . multivitamin with minerals  1 tablet Oral Daily  . mupirocin ointment  1 application Nasal BID  . simvastatin  20 mg Oral Q supper   Continuous Infusions: . cefTRIAXone (ROCEPHIN)  IV    . diltiazem (CARDIZEM) infusion 12.5 mg/hr (12/09/19 0006)   PRN Meds: acetaminophen **OR** acetaminophen, albuterol, melatonin, ondansetron (ZOFRAN) IV, pantoprazole   Vital Signs    Vitals:   12/08/19 1815 12/08/19 1940 12/08/19 2341 12/09/19 0441  BP:  117/60 135/82 118/60  Pulse: (!) 105 94 (!) 106 (!) 104  Resp: (!) 27 20 20 19   Temp:  98.7 F (37.1 C) 97.9 F (36.6 C) 97.8 F (36.6 C)  TempSrc:  Oral Oral Oral  SpO2: 93% 95% 95% 96%  Weight:  105.4 kg    Height:  5\' 5"  (1.651 m)      Intake/Output Summary (Last 24 hours) at 12/09/2019 1023 Last data filed at 12/09/2019 0442 Gross per 24 hour  Intake --  Output 650 ml  Net -650 ml   Last 3 Weights 12/08/2019 12/07/2019 12/05/2019  Weight (lbs) 232 lb 5.8 oz 220 lb 229 lb  Weight (kg) 105.4 kg 99.791 kg 103.874 kg      Telemetry    Atrial fibrillation at 90-100s- Personally Reviewed  ECG    N/A  Physical Exam   GEN: No acute distress.   Neck: No JVD Cardiac: IR IR, no murmurs, rubs, or gallops.  Respiratory: diminished breath sound with scattered wheezing GI: Soft, nontender,  non-distended  MS: 1+  edema; No deformity. Neuro:  Nonfocal  Psych: Normal affect   Labs     Chemistry Recent Labs  Lab 12/07/19 2344 12/07/19 2344 12/08/19 0500 12/08/19 1513 12/09/19 0021  NA 127*   < > 130* 132* 133*  K 3.5   < > 4.3 3.7 3.9  CL 95*  --  96*  --  100  CO2 19*  --  20*  --  22  GLUCOSE 246*  --  162*  --  75  BUN 25*  --  28*  --  25*  CREATININE 1.89*  --  1.90*  --  1.73*  CALCIUM 9.0  --  8.9  --  9.3  PROT 6.9  --   --   --   --   ALBUMIN 2.8*  --   --   --   --   AST 27  --   --   --   --   ALT 20  --   --   --   --   ALKPHOS 59  --   --   --   --   BILITOT 1.1  --   --   --   --   GFRNONAA 24*  --  24*  --  27*  GFRAA 28*  --  28*  --  31*  ANIONGAP 13  --  14  --  11   < > = values in this interval not displayed.     Hematology Recent Labs  Lab 12/07/19 2344 12/07/19 2344 12/08/19 0500 12/08/19 1513 12/09/19 0021  WBC 15.4*  --  13.1*  --  10.4  RBC 4.23  --  4.38  --  4.09  HGB 11.8*   < > 12.0 12.6 11.4*  HCT 38.1   < > 39.5 37.0 37.1  MCV 90.1  --  90.2  --  90.7  MCH 27.9  --  27.4  --  27.9  MCHC 31.0  --  30.4  --  30.7  RDW Not Measured  --  Not Measured  --  Not Measured  PLT 194  --  205  --  190   < > = values in this interval not displayed.    BNP Recent Labs  Lab 12/08/19 0510 12/09/19 0021  BNP 755.4* 714.3*     DDimer No results for input(s): DDIMER in the last 168 hours.   Radiology    DG Chest 2 View  Result Date: 12/07/2019 CLINICAL DATA:  Fever and weakness today. Symptoms worsening despite antibiotics. EXAM: CHEST - 2 VIEW COMPARISON:  11/04/2019 FINDINGS: Shallow inspiration. Cardiac enlargement. Slight interstitial pattern to the lungs is similar to prior study, possibly representing fibrosis or edema. No focal consolidation. No pleural effusions. No pneumothorax. Mediastinal contours appear intact. Calcified and tortuous aorta. Old right rib fractures. Degenerative changes in the spine. IMPRESSION:  Cardiac enlargement. Slight interstitial pattern to the lungs suggesting fibrosis or edema. No focal consolidation. Electronically Signed   By: Lucienne Capers M.D.   On: 12/07/2019 23:17   US RENAL  Result Date: 12/08/2019 CLINICAL DATA:  UTI. EXAM: RENAL / URINARY TRACT ULTRASOUND COMPLETE COMPARISON:  None. FINDINGS: Right Kidney: Renal measurements: 12.3 x 5.0 x 5.4 cm = volume: 174 mL. Echogenicity within normal limits. There is a 3.3 cm simple cyst from the inferior kidney. No solid mass or hydronephrosis visualized. No perirenal fluid collection. Left Kidney: Renal measurements: 13.8 x 6.3 x 5.8 cm = volume: 261 mL. Echogenicity within normal limits. No mass or hydronephrosis visualized. No perirenal fluid collection. Bladder: Appears normal for degree of bladder distention. Other: None. IMPRESSION: Simple cyst in the right kidney. Otherwise unremarkable renal ultrasound. Electronically Signed   By: Keith Rake M.D.   On: 12/08/2019 20:35   DG Chest Port 1 View  Result Date: 12/08/2019 CLINICAL DATA:  Shortness of breath and fever. EXAM: PORTABLE CHEST 1 VIEW COMPARISON:  12/07/2019 FINDINGS: No focal consolidation. Similar mild diffuse bronchitic and interstitial prominence. Low lung volumes. No visible pleural effusion or pneumothorax. Mild enlargement of the cardiac silhouette, similar to prior. Calcific atherosclerosis of the aorta IMPRESSION: 1. No evidence of acute cardiopulmonary abnormality. 2. Similar mild cardiac enlargement and chronic bronchitic and interstitial prominence. Electronically Signed   By: Margaretha Sheffield MD   On: 12/08/2019 14:10    Cardiac Studies   None this admission   Patient Profile     84 y.o. female with a hx of persistent atrial fibrillation/flutter, HTN, DM, OSA, hypothyroidism and GERD seen for AFIB RVR in setting of severe UTIsepsis.   Assessment & Plan    1. Persistent atrial fibrillation with RVR - She was in sinus rhythm when seen by Dr.  Quentin Ore 9/27. Currently episode brought  by acute illness. Tikosyn discontinued due to elevated renal function. Bedside echo showed normal LVEF. Pending formal echo.  - HR now stable in 90-100s on Cardizem gtt 12.5mg /hr.  - Continue Eliquis 2.5mg  BID - ? Another antiarrhythmic and plan cardioversion early next week  2. Acute hypoxic respiratory failure/ Moderate COPD - Likely due to acute pulmonary edema due to elevated HR and BP with underling COPD - BNP 741 - Pending echo - Breathing improving  3. UTI - Per primary team - Reassuring renal US  4. COPD - diffuse wheezing - Per primary tem  5. HTN - BP improving on cardizem drip  - Home bisoprolol and losartan on hold  6. AKi - Renal function improved today to 1.73 from 1.9 For questions or updates, please contact [place] Please consult [email or web address] for contact info under        SignedLeanor Kail, PA  12/09/2019, 10:23 AM

## 2019-12-09 NOTE — Telephone Encounter (Signed)
Advised Dr. Quentin Ore of message.  Per Dr. Quentin Ore he saw Pt in ER.  Will try to round on patient today.

## 2019-12-09 NOTE — Telephone Encounter (Signed)
Patient's daughter states the patient was seen Monday in the office and had a normal heart rate. She states Wednesday she went to the hospital for a urinary tract infection, sepsis and had gone back into afib. She states the sepsis affected her brain and behavior. She states the doctor who saw her in the hospital gave her a medication to help the afib, but missed her tikosyn dose last night. She states she is in the Heart and Vascular room 2C 17 and would like to know if Dr. Quentin Ore can see her.

## 2019-12-09 NOTE — Plan of Care (Signed)

## 2019-12-10 DIAGNOSIS — E872 Acidosis: Secondary | ICD-10-CM

## 2019-12-10 DIAGNOSIS — D509 Iron deficiency anemia, unspecified: Secondary | ICD-10-CM

## 2019-12-10 DIAGNOSIS — N1831 Chronic kidney disease, stage 3a: Secondary | ICD-10-CM

## 2019-12-10 DIAGNOSIS — I4819 Other persistent atrial fibrillation: Secondary | ICD-10-CM

## 2019-12-10 DIAGNOSIS — E119 Type 2 diabetes mellitus without complications: Secondary | ICD-10-CM

## 2019-12-10 DIAGNOSIS — N390 Urinary tract infection, site not specified: Secondary | ICD-10-CM

## 2019-12-10 DIAGNOSIS — I129 Hypertensive chronic kidney disease with stage 1 through stage 4 chronic kidney disease, or unspecified chronic kidney disease: Secondary | ICD-10-CM

## 2019-12-10 DIAGNOSIS — E039 Hypothyroidism, unspecified: Secondary | ICD-10-CM

## 2019-12-10 DIAGNOSIS — R918 Other nonspecific abnormal finding of lung field: Secondary | ICD-10-CM

## 2019-12-10 LAB — CBC
HCT: 34.5 % — ABNORMAL LOW (ref 36.0–46.0)
Hemoglobin: 10.5 g/dL — ABNORMAL LOW (ref 12.0–15.0)
MCH: 27.2 pg (ref 26.0–34.0)
MCHC: 30.4 g/dL (ref 30.0–36.0)
MCV: 89.4 fL (ref 80.0–100.0)
Platelets: 199 10*3/uL (ref 150–400)
RBC: 3.86 MIL/uL — ABNORMAL LOW (ref 3.87–5.11)
WBC: 8 10*3/uL (ref 4.0–10.5)
nRBC: 0 % (ref 0.0–0.2)

## 2019-12-10 LAB — BASIC METABOLIC PANEL
Anion gap: 11 (ref 5–15)
BUN: 24 mg/dL — ABNORMAL HIGH (ref 8–23)
CO2: 22 mmol/L (ref 22–32)
Calcium: 9.2 mg/dL (ref 8.9–10.3)
Chloride: 103 mmol/L (ref 98–111)
Creatinine, Ser: 1.53 mg/dL — ABNORMAL HIGH (ref 0.44–1.00)
GFR calc Af Amer: 36 mL/min — ABNORMAL LOW (ref >60)
GFR calc non Af Amer: 31 mL/min — ABNORMAL LOW (ref >60)
Glucose, Bld: 128 mg/dL — ABNORMAL HIGH (ref 70–99)
Potassium: 3.8 mmol/L (ref 3.5–5.1)
Sodium: 136 mmol/L (ref 135–145)

## 2019-12-10 LAB — GLUCOSE, CAPILLARY
Glucose-Capillary: 139 mg/dL — ABNORMAL HIGH (ref 70–99)
Glucose-Capillary: 191 mg/dL — ABNORMAL HIGH (ref 70–99)
Glucose-Capillary: 166 mg/dL — ABNORMAL HIGH (ref 70–99)
Glucose-Capillary: 209 mg/dL — ABNORMAL HIGH (ref 70–99)

## 2019-12-10 MED ORDER — BISOPROLOL FUMARATE 5 MG PO TABS
5.0000 mg | ORAL_TABLET | Freq: Every day | ORAL | Status: DC
  Administered 2019-12-10 – 2019-12-13 (×4): 5 mg via ORAL
  Filled 2019-12-10 (×4): qty 1

## 2019-12-10 MED ORDER — DILTIAZEM HCL 60 MG PO TABS
60.0000 mg | ORAL_TABLET | Freq: Four times a day (QID) | ORAL | Status: AC
  Administered 2019-12-10 – 2019-12-11 (×7): 60 mg via ORAL
  Filled 2019-12-10 (×6): qty 1

## 2019-12-10 MED ORDER — DOCUSATE SODIUM 100 MG PO CAPS
100.0000 mg | ORAL_CAPSULE | Freq: Two times a day (BID) | ORAL | Status: DC | PRN
  Administered 2019-12-11: 100 mg via ORAL
  Filled 2019-12-10 (×2): qty 1

## 2019-12-10 NOTE — Progress Notes (Signed)
Progress Note  Patient Name: Miranda Wilkinson Date of Encounter: 12/10/2019  Primary Cardiologist: Carlyle Dolly, MD  Subjective   No sense of palpitations, no chest pain.  Inpatient Medications    Scheduled Meds: . apixaban  2.5 mg Oral BID  . Chlorhexidine Gluconate Cloth  6 each Topical Q0600  . cholecalciferol  2,000 Units Oral Daily  . diltiazem  60 mg Oral Q6H  . insulin aspart  0-5 Units Subcutaneous QHS  . insulin aspart  0-9 Units Subcutaneous TID WC  . iron polysaccharides  150 mg Oral Daily  . levothyroxine  112 mcg Oral Q0600  . loratadine  10 mg Oral Daily  . magnesium oxide  200 mg Oral QODAY  . multivitamin with minerals  1 tablet Oral Daily  . mupirocin ointment  1 application Nasal BID  . simvastatin  20 mg Oral Q supper   Continuous Infusions: . cefTRIAXone (ROCEPHIN)  IV 2 g (12/10/19 0959)   PRN Meds: acetaminophen **OR** acetaminophen, albuterol, melatonin, ondansetron (ZOFRAN) IV, pantoprazole   Vital Signs    Vitals:   12/10/19 0000 12/10/19 0355 12/10/19 0710 12/10/19 1000  BP: 120/61 123/74 117/86 125/68  Pulse: (!) 101 (!) 108 (!) 115   Resp: 20 20 (!) 21   Temp:  98.3 F (36.8 C) 98.6 F (37 C)   TempSrc:  Oral Oral   SpO2: 96% 96% 94%   Weight:      Height:        Intake/Output Summary (Last 24 hours) at 12/10/2019 1035 Last data filed at 12/10/2019 0654 Gross per 24 hour  Intake 62.98 ml  Output 3625 ml  Net -3562.02 ml   Filed Weights   12/07/19 2249 12/08/19 1940  Weight: 99.8 kg 105.4 kg    Telemetry    Atrial fibrillation with RVR.  Personally reviewed.  ECG    An ECG dated 12/08/2019 was personally reviewed today and demonstrated:  Atrial fibrillation with RVR.  Physical Exam   GEN:  Elderly woman.  No acute distress.   Neck: No JVD. Cardiac:  Irregularly irregular, no gallop.  Respiratory:  Expiratory wheeze. GI: Soft, nontender, bowel sounds present. MS: Ankle edema; No deformity.  Labs     Chemistry Recent Labs  Lab 12/07/19 2344 12/07/19 2344 12/08/19 0500 12/08/19 0500 12/08/19 1513 12/09/19 0021 12/10/19 0047  NA 127*   < > 130*   < > 132* 133* 136  K 3.5   < > 4.3   < > 3.7 3.9 3.8  CL 95*   < > 96*  --   --  100 103  CO2 19*   < > 20*  --   --  22 22  GLUCOSE 246*   < > 162*  --   --  75 128*  BUN 25*   < > 28*  --   --  25* 24*  CREATININE 1.89*   < > 1.90*  --   --  1.73* 1.53*  CALCIUM 9.0   < > 8.9  --   --  9.3 9.2  PROT 6.9  --   --   --   --   --   --   ALBUMIN 2.8*  --   --   --   --   --   --   AST 27  --   --   --   --   --   --   ALT 20  --   --   --   --   --   --  ALKPHOS 59  --   --   --   --   --   --   BILITOT 1.1  --   --   --   --   --   --   GFRNONAA 24*   < > 24*  --   --  27* 31*  GFRAA 28*   < > 28*  --   --  31* 36*  ANIONGAP 13   < > 14  --   --  11 11   < > = values in this interval not displayed.     Hematology Recent Labs  Lab 12/08/19 0500 12/08/19 0500 12/08/19 1513 12/09/19 0021 12/10/19 0047  WBC 13.1*  --   --  10.4 8.0  RBC 4.38  --   --  4.09 3.86*  HGB 12.0   < > 12.6 11.4* 10.5*  HCT 39.5   < > 37.0 37.1 34.5*  MCV 90.2  --   --  90.7 89.4  MCH 27.4  --   --  27.9 27.2  MCHC 30.4  --   --  30.7 30.4  RDW Not Measured  --   --  Not Measured Not Measured  PLT 205  --   --  190 199   < > = values in this interval not displayed.    BNP Recent Labs  Lab 12/08/19 0510 12/09/19 0021  BNP 755.4* 714.3*     Radiology    US RENAL  Result Date: 12/08/2019 CLINICAL DATA:  UTI. EXAM: RENAL / URINARY TRACT ULTRASOUND COMPLETE COMPARISON:  None. FINDINGS: Right Kidney: Renal measurements: 12.3 x 5.0 x 5.4 cm = volume: 174 mL. Echogenicity within normal limits. There is a 3.3 cm simple cyst from the inferior kidney. No solid mass or hydronephrosis visualized. No perirenal fluid collection. Left Kidney: Renal measurements: 13.8 x 6.3 x 5.8 cm = volume: 261 mL. Echogenicity within normal limits. No mass or  hydronephrosis visualized. No perirenal fluid collection. Bladder: Appears normal for degree of bladder distention. Other: None. IMPRESSION: Simple cyst in the right kidney. Otherwise unremarkable renal ultrasound. Electronically Signed   By: Keith Rake M.D.   On: 12/08/2019 20:35   DG Chest Port 1 View  Result Date: 12/08/2019 CLINICAL DATA:  Shortness of breath and fever. EXAM: PORTABLE CHEST 1 VIEW COMPARISON:  12/07/2019 FINDINGS: No focal consolidation. Similar mild diffuse bronchitic and interstitial prominence. Low lung volumes. No visible pleural effusion or pneumothorax. Mild enlargement of the cardiac silhouette, similar to prior. Calcific atherosclerosis of the aorta IMPRESSION: 1. No evidence of acute cardiopulmonary abnormality. 2. Similar mild cardiac enlargement and chronic bronchitic and interstitial prominence. Electronically Signed   By: Margaretha Sheffield MD   On: 12/08/2019 14:10   ECHOCARDIOGRAM COMPLETE  Result Date: 12/09/2019    ECHOCARDIOGRAM REPORT   Patient Name:   Miranda Wilkinson Date of Exam: 12/09/2019 Medical Rec #:  [id number]      Height:       65.0 in Accession #:    [id number]     Weight:       232.4 lb Date of Birth:  01-02-1936      BSA:          2.108 m Patient Age:    84 years       BP:           118/60 mmHg Patient Gender: F              HR:  101 bpm. Exam Location:  Inpatient Procedure: 2D Echo, Cardiac Doppler and Color Doppler Indications:    ; I48.91* Unspeicified atrial fibrillation  History:        Patient has prior history of Echocardiogram examinations, most                 recent 02/24/2019. Abnormal ECG, COPD, Arrythmias:Atrial                 Fibrillation and Atrial Flutter, Signs/Symptoms:Bacteremia; Risk                 Factors:Hypertension and Dyslipidemia.  Sonographer:    Roseanna Rainbow RDCS Referring Phys: [id number] [facility]  Sonographer Comments: Technically difficult study due to poor echo windows. Image acquisition challenging due to  patient body habitus. IMPRESSIONS  1. Left ventricular ejection fraction, by estimation, is 60 to 65%. The left ventricle has normal function. The left ventricle has no regional wall motion abnormalities. There is mild left ventricular hypertrophy. Left ventricular diastolic function could not be evaluated.  2. Right ventricular systolic function is normal. The right ventricular size is normal. There is normal pulmonary artery systolic pressure.  3. The mitral valve is normal in structure. Trivial mitral valve regurgitation. No evidence of mitral stenosis.  4. The aortic valve is tricuspid. Aortic valve regurgitation is not visualized. Mild aortic valve sclerosis is present, with no evidence of aortic valve stenosis.  5. The inferior vena cava is normal in size with greater than 50% respiratory variability, suggesting right atrial pressure of 3 mmHg. FINDINGS  Left Ventricle: Left ventricular ejection fraction, by estimation, is 60 to 65%. The left ventricle has normal function. The left ventricle has no regional wall motion abnormalities. The left ventricular internal cavity size was normal in size. There is  mild left ventricular hypertrophy. Left ventricular diastolic function could not be evaluated due to atrial fibrillation. Left ventricular diastolic function could not be evaluated. Right Ventricle: The right ventricular size is normal. Right ventricular systolic function is normal. There is normal pulmonary artery systolic pressure. The tricuspid regurgitant velocity is 2.48 m/s, and with an assumed right atrial pressure of 3 mmHg,  the estimated right ventricular systolic pressure is 15.1 mmHg. Left Atrium: Left atrial size was normal in size. Right Atrium: Right atrial size was normal in size. Pericardium: There is no evidence of pericardial effusion. Mitral Valve: The mitral valve is normal in structure. Trivial mitral valve regurgitation. No evidence of mitral valve stenosis. MV peak gradient, 10.2 mmHg.  The mean mitral valve gradient is 3.0 mmHg. Tricuspid Valve: The tricuspid valve is normal in structure. Tricuspid valve regurgitation is mild . No evidence of tricuspid stenosis. Aortic Valve: The aortic valve is tricuspid. Aortic valve regurgitation is not visualized. Mild aortic valve sclerosis is present, with no evidence of aortic valve stenosis. Aortic valve peak gradient measures 25.8 mmHg. Pulmonic Valve: The pulmonic valve was normal in structure. Pulmonic valve regurgitation is not visualized. No evidence of pulmonic stenosis. Aorta: The aortic root is normal in size and structure. Venous: The inferior vena cava is normal in size with greater than 50% respiratory variability, suggesting right atrial pressure of 3 mmHg.   LEFT VENTRICLE PLAX 2D LVIDd:         3.70 cm LVIDs:         2.50 cm LV PW:         1.50 cm LV IVS:        1.50 cm LVOT diam:  1.90 cm LV SV:         53 LV SV Index:   25 LVOT Area:     2.84 cm  LV Volumes (MOD) LV vol d, MOD A2C: 38.5 ml LV vol d, MOD A4C: 36.2 ml LV vol s, MOD A2C: 14.3 ml LV vol s, MOD A4C: 13.9 ml LV SV MOD A2C:     24.2 ml LV SV MOD A4C:     36.2 ml LV SV MOD BP:      22.7 ml RIGHT VENTRICLE             IVC RV S prime:     12.60 cm/s  IVC diam: 2.30 cm TAPSE (M-mode): 2.0 cm LEFT ATRIUM             Index       RIGHT ATRIUM           Index LA diam:        4.50 cm 2.13 cm/m  RA Area:     13.70 cm LA Vol (A2C):   29.1 ml 13.80 ml/m RA Volume:   30.40 ml  14.42 ml/m LA Vol (A4C):   56.1 ml 26.61 ml/m LA Biplane Vol: 41.6 ml 19.73 ml/m  AORTIC VALVE AV Area (Vmax): 1.18 cm AV Vmax:        254.00 cm/s AV Peak Grad:   25.8 mmHg LVOT Vmax:      106.00 cm/s LVOT Vmean:     77.100 cm/s LVOT VTI:       0.186 m  AORTA Ao Root diam: 3.30 cm Ao Asc diam:  3.30 cm MITRAL VALVE                TRICUSPID VALVE MV Area (PHT): 3.21 cm     TR Peak grad:   24.6 mmHg MV Peak grad:  10.2 mmHg    TR Vmax:        248.00 cm/s MV Mean grad:  3.0 mmHg MV Vmax:       1.60 m/s      SHUNTS MV Vmean:      67.3 cm/s    Systemic VTI:  0.19 m MV Decel Time: 236 msec     Systemic Diam: 1.90 cm MV E velocity: 133.00 cm/s Kirk Ruths MD Electronically signed by Kirk Ruths MD Signature Date/Time: 12/09/2019/2:02:33 PM    Final     Patient Profile     84 y.o. female with a history of paroxysmal to persistent atrial fibrillation/flutter, hypertension, type 2 diabetes mellitus, hypothyroidism, OSA, and GERD.  Assessment & Plan    1.  Persistent atrial fibrillation with recent RVR in the setting of comorbid illnesses including COPD with acute hypoxic respiratory failure and UTI.  She has a history of paroxysmal to persistent atrial fibrillation/flutter, most recently on Tikosyn and Eliquis.  Chart reviewed with recommendations from Dr. Audie Box.  Tikosyn discontinued due to fluctuating renal dysfunction with plan for heart rate control at this time.  IV diltiazem discontinued with transition to oral diltiazem today.  Follow-up echocardiogram shows normal LVEF at 60 to 65%.  2.  Acute apostle respiratory failure with COPD.  3.  UTI with associated acute kidney injury.  Creatinine has come down to 1.53.  4.  Essential hypertension, home bisoprolol and losartan have been held.  Systolics 417E to 081K.  Cardizem 60 mg p.o. every 6 hours started today and Eliquis 2.5 mg twice daily continues.  Would resume bisoprolol at 5 mg daily, suspect she  will need dual AV nodal blockers for heart rate control based on review of her history.  At this point no plan for cardioversion since she is not on an antiarrhythmic.  Signed, Rozann Lesches, MD  12/10/2019, 10:35 AM

## 2019-12-10 NOTE — Progress Notes (Signed)
Patient ID: Miranda Wilkinson, female   DOB: 1935/06/15, 84 y.o.   MRN: [id number]  PROGRESS NOTE    Miranda Wilkinson  YBO:[id number] DOB: 1935/05/21 DOA: 12/07/2019 PCP: Caryl Bis, MD    Brief Narrative:  84 y.o.femalewith persistent atrial fibrillation on Tikosyn and apixaban, IDA,T2DM,GERD,HTN,HLD,hypothyroidism, OSA on CPAP,CKD stage III presenting to the ED via EMS with fever, generalized weakness and diarrhea. She was diagnosed with UTI by her PCP 9:29 AM and was restarted on oral antibiotics. She was given 500 cc by EMS and also received 500 mL in the ED. In the ED A. fib with RVR heart rate in 120s tachypneic, bp soft.WBC 15.4, creatinine 1.8 (baseline 1.0), glucose 246, anion gap 13, LFTs normal, lactic acid 3.1, INR 2.2. Sodium 127,UA grossly abnormal RBC more than 50 WBC more than 50, leukocytes moderate nitrate negative cloudy.Blood culture x2 sent, COVID-19 negative, CXR " cardiac enlargement and slight interstitial pattern to the lungs suggesting fibrosis or edema; no focal consolidation". She was admitted with severe sepsis, A. fib with RVR. Seen by cardiology placed on Cardizem drip converted to p.o. diltiazem with addition of beta-blocker for rate control.   Assessment & Plan:   Principal Problem:   Severe sepsis ([facility]) Active Problems:   Essential hypertension, benign   Diarrhea   AKI (acute kidney injury) ([facility])   Atrial fibrillation with rapid ventricular response ([facility])   Sepsis ([facility])  Severe sepsis with UTI POA, with lactic acidosis: Hemodynamically stable except for A. fib with RVR on Cardizem infusion.  Lactic acidosis resolved.  Febrile 6 PM yesterday, on ceftriaxone 2 g, urine culture less than 10,000 colonies, Covid 19 and RVP panel negative, blood culture negative, GI panel negative so far.  Shortness of breath/wheezing/acute hypoxic respiratory failure: PFT with moderate obstructive disease. Continue albuterol.  Her breathing started to get worse after heart  rate increased with A. Fib.  CXR repeat 9/30- 'similar mild cardiac enlargement and chronic bronchitic and interstitial prominence", abg with mild hypoxia.  Also triggered by anxiety, wheezing is mostly coming from upper airway.  Wheezing is much improved today.  Persistent atrial fibrillation,  suspect precipitated by UTI sepsis: On Tikosyn currently on hold due to renal dysfunction, on Eliquis dose appropriately reduced for renal dysfunction.  TSH 3.4.  Her echo shows EF of 60 to 65%, left ventricular hypertrophy.  Transition from Cardizem drip to diltiazem p.o. and bisoprolol today.  Abnormal chest x-ray with slight interstitial pattern fibrosis or edema BNP up to 755 but with normal LVEF-could be due to rapid A. Fib.  Hyponatremia: Mild, likely hypovolemic from poor oral intake.  Resolved today    AKI on CKD stage IIIa/normal anion gap metabolic acidosis, baseline creatinine around 1.0.Creatinine 1.8-1.9 likely from volume depletion poor oral intake and diarrhea at home.  Received IV fluids on admission currently holding due to wheezing, questionable fluid overload.  Encourage oral intake, creatinine downtrending.  Creatinine is 1.53 today Monitor.  T2DM,HbA1c 5.1,holding home Metformin and glipizide.  Blood sugar is controlled on sliding scale insulin  Hypertension: BP stable on Cardizem drip.  Holding home losartan and bisoprolol.   HLD: Continue statin.   Hypothyroidism continue home Synthroid.TSH 3.4 euthyroid.  IDA on iron supplementation continue the same.  Hemoglobin is stable, she has a GI follow-up for EGD and colonoscopy in October with [facility] based upon previous anemia.  She was hospitalized recently needing blood transfusion for hemoglobin of 4.4 g  OSA CPAP at night.  Morbid obesity with BMI  36:Will benefit with weight loss and healthy lifestyle. DVT prophylaxis: HG:[id number] Code Status: Full code  Family Communication: Patient at bedside Disposition Plan: Home  pending improvement, PT/OT eval  Patient continues to be inpatient due to need for telemetry, ongoing atrial flutter with rapid ventricular response, IV medications for rate control.  Consultants:   Cardiology  Procedures:  2D echo  Antimicrobials: Anti-infectives (From admission, onward)   Start     Dose/Rate Route Frequency Ordered Stop   12/09/19 1000  cefTRIAXone (ROCEPHIN) 2 g in sodium chloride 0.9 % 100 mL IVPB        2 g 200 mL/hr over 30 Minutes Intravenous Daily 12/08/19 0755     12/09/19 0600  cefTRIAXone (ROCEPHIN) 1 g in sodium chloride 0.9 % 100 mL IVPB  Status:  Discontinued        1 g 200 mL/hr over 30 Minutes Intravenous Daily 12/08/19 0354 12/08/19 0755   12/08/19 0800  cefTRIAXone (ROCEPHIN) 1 g in sodium chloride 0.9 % 100 mL IVPB        1 g 200 mL/hr over 30 Minutes Intravenous  Once 12/08/19 0755 12/08/19 0955   12/08/19 0315  cefTRIAXone (ROCEPHIN) 1 g in sodium chloride 0.9 % 100 mL IVPB        1 g 200 mL/hr over 30 Minutes Intravenous  Once 12/08/19 0308 12/08/19 0440       Subjective: Slightly improved today.  Still feels weak.  Denies chest pain.  Objective: Vitals:   12/10/19 0355 12/10/19 0710 12/10/19 1000 12/10/19 1100  BP: 123/74 117/86 125/68 138/83  Pulse: (!) 108 (!) 115  (!) 122  Resp: 20 (!) 21  (!) 24  Temp: 98.3 F (36.8 C) 98.6 F (37 C)  97.7 F (36.5 C)  TempSrc: Oral Oral  Oral  SpO2: 96% 94%  94%  Weight:      Height:        Intake/Output Summary (Last 24 hours) at 12/10/2019 1217 Last data filed at 12/10/2019 1100 Gross per 24 hour  Intake 302.98 ml  Output 4425 ml  Net -4122.02 ml   Filed Weights   12/07/19 2249 12/08/19 1940  Weight: 99.8 kg 105.4 kg    Examination:  General exam: Appears calm and comfortable  Respiratory system: Expiratory wheezes, respiratory effort normal. Cardiovascular system: S1 & S2 heard, irregularly irregular, tachy.  Gastrointestinal system: Abdomen is nondistended, soft and  nontender.  Central nervous system: Alert and oriented. No focal neurological deficits. Extremities: Symmetric  Skin: No rashes Psychiatry: Judgement and insight appear normal. Mood & affect appropriate.     Data Reviewed: I have personally reviewed following labs and imaging studies  CBC: Recent Labs  Lab 12/07/19 2344 12/08/19 0500 12/08/19 1513 12/09/19 0021 12/10/19 0047  WBC 15.4* 13.1*  --  10.4 8.0  NEUTROABS 13.8*  --   --   --   --   HGB 11.8* 12.0 12.6 11.4* 10.5*  HCT 38.1 39.5 37.0 37.1 34.5*  MCV 90.1 90.2  --  90.7 89.4  PLT 194 205  --  190 341   Basic Metabolic Panel: Recent Labs  Lab 12/07/19 2344 12/08/19 0500 12/08/19 1513 12/09/19 0021 12/10/19 0047  NA 127* 130* 132* 133* 136  K 3.5 4.3 3.7 3.9 3.8  CL 95* 96*  --  100 103  CO2 19* 20*  --  22 22  GLUCOSE 246* 162*  --  75 128*  BUN 25* 28*  --  25* 24*  CREATININE 1.89*  1.90*  --  1.73* 1.53*  CALCIUM 9.0 8.9  --  9.3 9.2   GFR: Estimated Creatinine Clearance: 34.2 mL/min (A) (by C-G formula based on SCr of 1.53 mg/dL (H)). Liver Function Tests: Recent Labs  Lab 12/07/19 2344  AST 27  ALT 20  ALKPHOS 59  BILITOT 1.1  PROT 6.9  ALBUMIN 2.8*   Coagulation Profile: Recent Labs  Lab 12/07/19 2344  INR 2.2*   HbA1C: Recent Labs    12/08/19 0510  HGBA1C 5.1   CBG: Recent Labs  Lab 12/09/19 1105 12/09/19 1608 12/09/19 2141 12/10/19 0607 12/10/19 1059  GLUCAP 95 129* 149* 139* 191*   Thyroid Function Tests: Recent Labs    12/08/19 0430  TSH 3.408   Sepsis Labs: Recent Labs  Lab 12/07/19 2248 12/08/19 0510 12/08/19 0930 12/08/19 1952  LATICACIDVEN 3.1* 2.5* 2.1* 1.1    Recent Results (from the past 240 hour(s))  Culture, blood (Routine x 2)     Status: None (Preliminary result)   Collection Time: 12/07/19 11:15 PM   Specimen: BLOOD RIGHT FOREARM  Result Value Ref Range Status   Specimen Description BLOOD RIGHT FOREARM  Final   Special Requests   Final     BOTTLES DRAWN AEROBIC AND ANAEROBIC Blood Culture adequate volume   Culture   Final    NO GROWTH 2 DAYS Performed at [facility], [place] [address], [place], [place] [address]    Report Status PENDING  Incomplete  Resp Panel by RT PCR (RSV, Flu A&B, Covid) - Nasopharyngeal Swab     Status: None   Collection Time: 12/08/19  3:45 AM   Specimen: Nasopharyngeal Swab  Result Value Ref Range Status   SARS Coronavirus 2 by RT PCR NEGATIVE NEGATIVE Final    Comment: (NOTE) SARS-CoV-2 target nucleic acids are NOT DETECTED.  The SARS-CoV-2 RNA is generally detectable in upper respiratoy specimens during the acute phase of infection. The lowest concentration of SARS-CoV-2 viral copies this assay can detect is 131 copies/mL. A negative result does not preclude SARS-Cov-2 infection and should not be used as the sole basis for treatment or other patient management decisions. A negative result may occur with  improper specimen collection/handling, submission of specimen other than nasopharyngeal swab, presence of viral mutation(s) within the areas targeted by this assay, and inadequate number of viral copies (<131 copies/mL). A negative result must be combined with clinical observations, patient history, and epidemiological information. The expected result is Negative.  Fact Sheet for Patients:  PinkCheek.be  Fact Sheet for Healthcare Providers:  GravelBags.it  This test is no t yet approved or cleared by the [place] FDA and  has been authorized for detection and/or diagnosis of SARS-CoV-2 by FDA under an Emergency Use Authorization (EUA). This EUA will remain  in effect (meaning this test can be used) for the duration of the COVID-19 declaration under Section 564(b)(1) of the Act, 21 U.S.C. section 360bbb-3(b)(1), unless the authorization is terminated or revoked sooner.     Influenza A by PCR NEGATIVE NEGATIVE Final    Influenza B by PCR NEGATIVE NEGATIVE Final    Comment: (NOTE) The Xpert Xpress SARS-CoV-2/FLU/RSV assay is intended as an aid in  the diagnosis of influenza from Nasopharyngeal swab specimens and  should not be used as a sole basis for treatment. Nasal washings and  aspirates are unacceptable for Xpert Xpress SARS-CoV-2/FLU/RSV  testing.  Fact Sheet for Patients: PinkCheek.be  Fact Sheet for Healthcare Providers: GravelBags.it  This test is  not yet approved or cleared by the [place] and  has been authorized for detection and/or diagnosis of SARS-CoV-2 by  FDA under an Emergency Use Authorization (EUA). This EUA will remain  in effect (meaning this test can be used) for the duration of the  Covid-19 declaration under Section 564(b)(1) of the Act, 21  U.S.C. section 360bbb-3(b)(1), unless the authorization is  terminated or revoked.    Respiratory Syncytial Virus by PCR NEGATIVE NEGATIVE Final    Comment: (NOTE) Fact Sheet for Patients: PinkCheek.be  Fact Sheet for Healthcare Providers: GravelBags.it  This test is not yet approved or cleared by the [place] FDA and  has been authorized for detection and/or diagnosis of SARS-CoV-2 by  FDA under an Emergency Use Authorization (EUA). This EUA will remain  in effect (meaning this test can be used) for the duration of the  COVID-19 declaration under Section 564(b)(1) of the Act, 21 U.S.C.  section 360bbb-3(b)(1), unless the authorization is terminated or  revoked. Performed at [facility], [place] [address], [place], [place] [address]   Urine culture     Status: Abnormal   Collection Time: 12/08/19  4:27 AM   Specimen: Urine, Random  Result Value Ref Range Status   Specimen Description URINE, RANDOM  Final   Special Requests NONE  Final   Culture (A)  Final    <10,000 COLONIES/mL INSIGNIFICANT  GROWTH Performed at [facility], [place] [address], [place], [place] [address]    Report Status 12/09/2019 FINAL  Final  MRSA PCR Screening     Status: Abnormal   Collection Time: 12/08/19  7:55 PM   Specimen: Nasal Mucosa; Nasopharyngeal  Result Value Ref Range Status   MRSA by PCR POSITIVE (A) NEGATIVE Final    Comment:        The GeneXpert MRSA Assay (FDA approved for NASAL specimens only), is one component of a comprehensive MRSA colonization surveillance program. It is not intended to diagnose MRSA infection nor to guide or monitor treatment for MRSA infections. RESULT CALLED TO, READ BACK BY AND VERIFIED WITH: Alice Rieger RN 12/09/19 AT 0025 SK Performed at [facility], [place] [address] [place], [place] [address]   Gastrointestinal Panel by PCR , Stool     Status: None   Collection Time: 12/09/19  8:15 AM   Specimen: Stool  Result Value Ref Range Status   Campylobacter species NOT DETECTED NOT DETECTED Final   Plesimonas shigelloides NOT DETECTED NOT DETECTED Final   Salmonella species NOT DETECTED NOT DETECTED Final   Yersinia enterocolitica NOT DETECTED NOT DETECTED Final   Vibrio species NOT DETECTED NOT DETECTED Final   Vibrio cholerae NOT DETECTED NOT DETECTED Final   Enteroaggregative E coli (EAEC) NOT DETECTED NOT DETECTED Final   Enteropathogenic E coli (EPEC) NOT DETECTED NOT DETECTED Final   Enterotoxigenic E coli (ETEC) NOT DETECTED NOT DETECTED Final   Shiga like toxin producing E coli (STEC) NOT DETECTED NOT DETECTED Final   Shigella/Enteroinvasive E coli (EIEC) NOT DETECTED NOT DETECTED Final   Cryptosporidium NOT DETECTED NOT DETECTED Final   Cyclospora cayetanensis NOT DETECTED NOT DETECTED Final   Entamoeba histolytica NOT DETECTED NOT DETECTED Final   Giardia lamblia NOT DETECTED NOT DETECTED Final   Adenovirus F40/41 NOT DETECTED NOT DETECTED Final   Astrovirus NOT DETECTED NOT DETECTED Final   Norovirus GI/GII NOT DETECTED NOT DETECTED Final     Rotavirus A NOT DETECTED NOT DETECTED Final   Sapovirus (  I, II, IV, and V) NOT DETECTED NOT DETECTED Final    Comment: Performed at [facility], [place], [place] [address]  C Difficile Quick Screen w PCR reflex     Status: None   Collection Time: 12/09/19  8:15 AM   Specimen: STOOL  Result Value Ref Range Status   C Diff antigen NEGATIVE NEGATIVE Final   C Diff toxin NEGATIVE NEGATIVE Final   C Diff interpretation No C. difficile detected.  Final    Comment: Performed at [facility], [place] [address], [place], [place] [address]      Radiology Studies: US RENAL  Result Date: 12/08/2019 CLINICAL DATA:  UTI. EXAM: RENAL / URINARY TRACT ULTRASOUND COMPLETE COMPARISON:  None. FINDINGS: Right Kidney: Renal measurements: 12.3 x 5.0 x 5.4 cm = volume: 174 mL. Echogenicity within normal limits. There is a 3.3 cm simple cyst from the inferior kidney. No solid mass or hydronephrosis visualized. No perirenal fluid collection. Left Kidney: Renal measurements: 13.8 x 6.3 x 5.8 cm = volume: 261 mL. Echogenicity within normal limits. No mass or hydronephrosis visualized. No perirenal fluid collection. Bladder: Appears normal for degree of bladder distention. Other: None. IMPRESSION: Simple cyst in the right kidney. Otherwise unremarkable renal ultrasound. Electronically Signed   By: Keith Rake M.D.   On: 12/08/2019 20:35   DG Chest Port 1 View  Result Date: 12/08/2019 CLINICAL DATA:  Shortness of breath and fever. EXAM: PORTABLE CHEST 1 VIEW COMPARISON:  12/07/2019 FINDINGS: No focal consolidation. Similar mild diffuse bronchitic and interstitial prominence. Low lung volumes. No visible pleural effusion or pneumothorax. Mild enlargement of the cardiac silhouette, similar to prior. Calcific atherosclerosis of the aorta IMPRESSION: 1. No evidence of acute cardiopulmonary abnormality. 2. Similar mild cardiac enlargement and chronic bronchitic and interstitial prominence.  Electronically Signed   By: Margaretha Sheffield MD   On: 12/08/2019 14:10   ECHOCARDIOGRAM COMPLETE  Result Date: 12/09/2019    ECHOCARDIOGRAM REPORT   Patient Name:   NATANIA FINIGAN O'DELL Date of Exam: 12/09/2019 Medical Rec #:  [id number]      Height:       65.0 in Accession #:    [id number]     Weight:       232.4 lb Date of Birth:  Oct 08, 1935      BSA:          2.108 m Patient Age:    61 years       BP:           118/60 mmHg Patient Gender: F              HR:           101 bpm. Exam Location:  Inpatient Procedure: 2D Echo, Cardiac Doppler and Color Doppler Indications:    ; I48.91* Unspeicified atrial fibrillation  History:        Patient has prior history of Echocardiogram examinations, most                 recent 02/24/2019. Abnormal ECG, COPD, Arrythmias:Atrial                 Fibrillation and Atrial Flutter, Signs/Symptoms:Bacteremia; Risk                 Factors:Hypertension and Dyslipidemia.  Sonographer:    Roseanna Rainbow RDCS Referring Phys: [id number] [facility]  Sonographer Comments: Technically difficult study due to poor echo windows. Image acquisition challenging due to patient body habitus.  IMPRESSIONS  1. Left ventricular ejection fraction, by estimation, is 60 to 65%. The left ventricle has normal function. The left ventricle has no regional wall motion abnormalities. There is mild left ventricular hypertrophy. Left ventricular diastolic function could not be evaluated.  2. Right ventricular systolic function is normal. The right ventricular size is normal. There is normal pulmonary artery systolic pressure.  3. The mitral valve is normal in structure. Trivial mitral valve regurgitation. No evidence of mitral stenosis.  4. The aortic valve is tricuspid. Aortic valve regurgitation is not visualized. Mild aortic valve sclerosis is present, with no evidence of aortic valve stenosis.  5. The inferior vena cava is normal in size with greater than 50% respiratory variability, suggesting right atrial pressure  of 3 mmHg. FINDINGS  Left Ventricle: Left ventricular ejection fraction, by estimation, is 60 to 65%. The left ventricle has normal function. The left ventricle has no regional wall motion abnormalities. The left ventricular internal cavity size was normal in size. There is  mild left ventricular hypertrophy. Left ventricular diastolic function could not be evaluated due to atrial fibrillation. Left ventricular diastolic function could not be evaluated. Right Ventricle: The right ventricular size is normal. Right ventricular systolic function is normal. There is normal pulmonary artery systolic pressure. The tricuspid regurgitant velocity is 2.48 m/s, and with an assumed right atrial pressure of 3 mmHg,  the estimated right ventricular systolic pressure is 58.0 mmHg. Left Atrium: Left atrial size was normal in size. Right Atrium: Right atrial size was normal in size. Pericardium: There is no evidence of pericardial effusion. Mitral Valve: The mitral valve is normal in structure. Trivial mitral valve regurgitation. No evidence of mitral valve stenosis. MV peak gradient, 10.2 mmHg. The mean mitral valve gradient is 3.0 mmHg. Tricuspid Valve: The tricuspid valve is normal in structure. Tricuspid valve regurgitation is mild . No evidence of tricuspid stenosis. Aortic Valve: The aortic valve is tricuspid. Aortic valve regurgitation is not visualized. Mild aortic valve sclerosis is present, with no evidence of aortic valve stenosis. Aortic valve peak gradient measures 25.8 mmHg. Pulmonic Valve: The pulmonic valve was normal in structure. Pulmonic valve regurgitation is not visualized. No evidence of pulmonic stenosis. Aorta: The aortic root is normal in size and structure. Venous: The inferior vena cava is normal in size with greater than 50% respiratory variability, suggesting right atrial pressure of 3 mmHg.   LEFT VENTRICLE PLAX 2D LVIDd:         3.70 cm LVIDs:         2.50 cm LV PW:         1.50 cm LV IVS:        1.50  cm LVOT diam:     1.90 cm LV SV:         53 LV SV Index:   25 LVOT Area:     2.84 cm  LV Volumes (MOD) LV vol d, MOD A2C: 38.5 ml LV vol d, MOD A4C: 36.2 ml LV vol s, MOD A2C: 14.3 ml LV vol s, MOD A4C: 13.9 ml LV SV MOD A2C:     24.2 ml LV SV MOD A4C:     36.2 ml LV SV MOD BP:      22.7 ml RIGHT VENTRICLE             IVC RV S prime:     12.60 cm/s  IVC diam: 2.30 cm TAPSE (M-mode): 2.0 cm LEFT ATRIUM  Index       RIGHT ATRIUM           Index LA diam:        4.50 cm 2.13 cm/m  RA Area:     13.70 cm LA Vol (A2C):   29.1 ml 13.80 ml/m RA Volume:   30.40 ml  14.42 ml/m LA Vol (A4C):   56.1 ml 26.61 ml/m LA Biplane Vol: 41.6 ml 19.73 ml/m  AORTIC VALVE AV Area (Vmax): 1.18 cm AV Vmax:        254.00 cm/s AV Peak Grad:   25.8 mmHg LVOT Vmax:      106.00 cm/s LVOT Vmean:     77.100 cm/s LVOT VTI:       0.186 m  AORTA Ao Root diam: 3.30 cm Ao Asc diam:  3.30 cm MITRAL VALVE                TRICUSPID VALVE MV Area (PHT): 3.21 cm     TR Peak grad:   24.6 mmHg MV Peak grad:  10.2 mmHg    TR Vmax:        248.00 cm/s MV Mean grad:  3.0 mmHg MV Vmax:       1.60 m/s     SHUNTS MV Vmean:      67.3 cm/s    Systemic VTI:  0.19 m MV Decel Time: 236 msec     Systemic Diam: 1.90 cm MV E velocity: 133.00 cm/s Kirk Ruths MD Electronically signed by Kirk Ruths MD Signature Date/Time: 12/09/2019/2:02:33 PM    Final      Scheduled Meds: . apixaban  2.5 mg Oral BID  . bisoprolol  5 mg Oral Daily  . Chlorhexidine Gluconate Cloth  6 each Topical Q0600  . cholecalciferol  2,000 Units Oral Daily  . diltiazem  60 mg Oral Q6H  . insulin aspart  0-5 Units Subcutaneous QHS  . insulin aspart  0-9 Units Subcutaneous TID WC  . iron polysaccharides  150 mg Oral Daily  . levothyroxine  112 mcg Oral Q0600  . loratadine  10 mg Oral Daily  . magnesium oxide  200 mg Oral QODAY  . multivitamin with minerals  1 tablet Oral Daily  . mupirocin ointment  1 application Nasal BID  . simvastatin  20 mg Oral Q supper    Continuous Infusions: . cefTRIAXone (ROCEPHIN)  IV 2 g (12/10/19 0959)     LOS: 2 days    Donnamae Jude, MD 12/10/2019 12:17 PM [phone] [facility] If 7PM-7AM, please contact night-coverage 12/10/2019, 12:17 PM

## 2019-12-10 NOTE — Progress Notes (Addendum)
Patient has been given cardizem PO and bisoprolol PO at noon today---cardizem gtt stopped at 11am----at 12:40, she is sustaining heart rate of 130's, advised dr Kennon Rounds, she wants to wait a little bit to see if better controlled after some time---at 1500, patient's hr has remained normal, no further intervention needed

## 2019-12-11 DIAGNOSIS — R6520 Severe sepsis without septic shock: Secondary | ICD-10-CM

## 2019-12-11 DIAGNOSIS — A419 Sepsis, unspecified organism: Principal | ICD-10-CM

## 2019-12-11 LAB — CBC
HCT: 36.8 % (ref 36.0–46.0)
Hemoglobin: 11.3 g/dL — ABNORMAL LOW (ref 12.0–15.0)
MCH: 27.8 pg (ref 26.0–34.0)
MCHC: 30.7 g/dL (ref 30.0–36.0)
MCV: 90.6 fL (ref 80.0–100.0)
Platelets: 252 10*3/uL (ref 150–400)
RBC: 4.06 MIL/uL (ref 3.87–5.11)
WBC: 8.8 10*3/uL (ref 4.0–10.5)
nRBC: 0 % (ref 0.0–0.2)

## 2019-12-11 LAB — GLUCOSE, CAPILLARY
Glucose-Capillary: 140 mg/dL — ABNORMAL HIGH (ref 70–99)
Glucose-Capillary: 138 mg/dL — ABNORMAL HIGH (ref 70–99)
Glucose-Capillary: 136 mg/dL — ABNORMAL HIGH (ref 70–99)
Glucose-Capillary: 137 mg/dL — ABNORMAL HIGH (ref 70–99)

## 2019-12-11 LAB — BASIC METABOLIC PANEL
Anion gap: 10 (ref 5–15)
BUN: 20 mg/dL (ref 8–23)
CO2: 24 mmol/L (ref 22–32)
Calcium: 9.5 mg/dL (ref 8.9–10.3)
Chloride: 103 mmol/L (ref 98–111)
Creatinine, Ser: 1.28 mg/dL — ABNORMAL HIGH (ref 0.44–1.00)
GFR calc Af Amer: 45 mL/min — ABNORMAL LOW (ref >60)
GFR calc non Af Amer: 39 mL/min — ABNORMAL LOW (ref >60)
Glucose, Bld: 143 mg/dL — ABNORMAL HIGH (ref 70–99)
Potassium: 4 mmol/L (ref 3.5–5.1)
Sodium: 137 mmol/L (ref 135–145)

## 2019-12-11 MED ORDER — DILTIAZEM HCL ER COATED BEADS 240 MG PO CP24
240.0000 mg | ORAL_CAPSULE | Freq: Every day | ORAL | Status: DC
  Administered 2019-12-12 – 2019-12-13 (×2): 240 mg via ORAL
  Filled 2019-12-11 (×2): qty 1

## 2019-12-11 MED ORDER — APIXABAN 5 MG PO TABS
5.0000 mg | ORAL_TABLET | Freq: Two times a day (BID) | ORAL | Status: DC
  Administered 2019-12-11 – 2019-12-13 (×5): 5 mg via ORAL
  Filled 2019-12-11 (×4): qty 1

## 2019-12-11 NOTE — Progress Notes (Signed)
   Progress Note  Patient Name: ALEINA BURGIO Date of Encounter: 12/11/2019  Primary Cardiologist: Carlyle Dolly, MD  Please see full progress note from yesterday with plan and medication adjustments.  Heart rate is better controlled today in atrial fibrillation, 80-100 range.  Continue current doses of Zebeta, Cardizem, and Eliquis.  Can likely transition to Cardizem CD 240 mg daily starting tomorrow.  Signed, Rozann Lesches, MD  12/11/2019, 9:12 AM

## 2019-12-11 NOTE — Progress Notes (Signed)
Pt already on Home CPAP upon my arrival and resting comfortably.

## 2019-12-11 NOTE — Progress Notes (Signed)
Patient ID: Miranda Wilkinson, female   DOB: 09-May-1935, 84 y.o.   MRN: [id number]   PROGRESS NOTE    Miranda Wilkinson  RWE:[id number] DOB: [birth year] DOA: 12/07/2019 PCP: Caryl Bis, MD    Brief Narrative:  84 y.o.femalewith persistent atrial fibrillation on Tikosyn and apixaban, IDA,T2DM,GERD,HTN,HLD,hypothyroidism, OSA on CPAP,CKD stage III presenting to the ED via EMS with fever, generalized weakness and diarrhea. She was diagnosed with UTI by her PCP 9:29 AM and was restarted on oral antibiotics. She was given 500 cc by EMS and also received 500 mL in the ED. In the ED A. fib with RVR heart rate in 120s tachypneic, bp soft.WBC 15.4, creatinine 1.8 (baseline 1.0), glucose 246, anion gap 13, LFTs normal, lactic acid 3.1, INR 2.2. Sodium 127,UA grossly abnormal RBC more than 50 WBC more than 50, leukocytes moderate nitrate negative cloudy.Blood culture x2 sent, COVID-19 negative, CXR " cardiac enlargement and slight interstitial pattern to the lungs suggesting fibrosis or edema; no focal consolidation". She was admitted withsevere sepsis, A. fib with RVR. Seen by cardiology placed on Cardizem drip converted to p.o. diltiazem with addition of beta-blocker for rate control.   Assessment & Plan:   Principal Problem:   Severe sepsis ([place]) Active Problems:   Essential hypertension, benign   Diarrhea   AKI (acute kidney injury) ([place])   Atrial fibrillation with rapid ventricular response (HCC)   Sepsis (H[facility])  Severe sepsis with UTI POA, with lactic acidosis:Hemodynamically stable except for A. fib with RVR on Cardizem infusion. Lactic acidosis resolved. Febrile 6 PM 09/30, on ceftriaxone 2 g,urine culture less than 10,000 colonies, Covid 19and RVP panel negative,blood culture negative, GI panel negative so far.  Shortness of breath/wheezing/acute hypoxic respiratory failure:PFT with moderate obstructive disease. Continue albuterol.Her breathing started to get worse after heart  rate increased with A. Fib.CXRrepeat 9/30- 'similar mild cardiac enlargement and chronic bronchitic and interstitial prominence", abgwith mild hypoxia.  Persistent atrial fibrillation, suspect precipitated by UTI sepsis:On Tikosyn currently on hold due to renal dysfunction and cards recommends stopping this, on Eliquis dose appropriately reduced for renal dysfunction.TSH 3.4.  Her echo shows EF of 60 to 65%, left ventricular hypertrophy.  Transition from Cardizem drip to diltiazem p.o. and bisoprolol yesterday.  Further treatment to Cardizem CD 240 mg tomorrow  Abnormal chest x-ray with slight interstitial pattern fibrosis or edema BNP up to 755 but with normal LVEF-could be due to rapid A. Fib. Down 6.3 L since admission.  Hyponatremia:Mild, likely hypovolemic from poor oral intake.  Resolved today  AKI on CKD stage IIIa/normal anion gap metabolic acidosis,baseline creatinine around 1.0.Creatinine 1.8-1.9 likely from volume depletion poor oral intake and diarrheaat home. . Encourage oral intake, creatinine downtrending.  Creatinine is 1. 28 todayMonitor.  T2DM,HbA1c 5.1,holding home Metformin and glipizide.Blood sugar is controlled on sliding scale insulin  Hypertension:BP stable on Cardizem drip. Holding home losartan and bisoprolol.   YPP:JKDTOIZT statin.  Hypothyroidismcontinue home Synthroid.TSH 3.4 euthyroid.  IDA on iron supplementation continue the same. Hemoglobin is stable, she has a GI follow-up for EGD and colonoscopy in October with [facility] based upon previous anemia. She was hospitalized recently needing blood transfusion for hemoglobin of 4.4 g.  Today hemoglobin is up to 11.6  OSA CPAPat night.  Morbid obesity with BMI 36:Will benefit with weight loss and healthy lifestyle.   DVT prophylaxis: IW:PYKDXIP Code Status: Full code  Family Communication: Patient at bedside Disposition Plan: Home pending improvement, PT OT eval Patient  continues to be inpatient due to  continued need for telemetry, pending work-up   Consultants:   Cardiology  Procedures:  2D echo which shows LVEF at 60 to 65%, mild left ventricular hypertrophy, normal right ventricle, normal valves.  Antimicrobials: Anti-infectives (From admission, onward)   Start     Dose/Rate Route Frequency Ordered Stop   12/09/19 1000  cefTRIAXone (ROCEPHIN) 2 g in sodium chloride 0.9 % 100 mL IVPB        2 g 200 mL/hr over 30 Minutes Intravenous Daily 12/08/19 0755     12/09/19 0600  cefTRIAXone (ROCEPHIN) 1 g in sodium chloride 0.9 % 100 mL IVPB  Status:  Discontinued        1 g 200 mL/hr over 30 Minutes Intravenous Daily 12/08/19 0354 12/08/19 0755   12/08/19 0800  cefTRIAXone (ROCEPHIN) 1 g in sodium chloride 0.9 % 100 mL IVPB        1 g 200 mL/hr over 30 Minutes Intravenous  Once 12/08/19 0755 12/08/19 0955   12/08/19 0315  cefTRIAXone (ROCEPHIN) 1 g in sodium chloride 0.9 % 100 mL IVPB        1 g 200 mL/hr over 30 Minutes Intravenous  Once 12/08/19 0308 12/08/19 0440       Subjective: Feels very tired today.  Objective: Vitals:   12/10/19 2344 12/11/19 0419 12/11/19 0735 12/11/19 1100  BP: 138/66 140/62 131/70 (!) 151/81  Pulse:   88 98  Resp:   18 19  Temp: 98.4 F (36.9 C) 97.8 F (36.6 C) 98.1 F (36.7 C) 98.2 F (36.8 C)  TempSrc: Oral Oral Oral Oral  SpO2:   95% 94%  Weight:      Height:        Intake/Output Summary (Last 24 hours) at 12/11/2019 1426 Last data filed at 12/11/2019 1108 Gross per 24 hour  Intake 680 ml  Output 2500 ml  Net -1820 ml   Filed Weights   12/07/19 2249 12/08/19 1940  Weight: 99.8 kg 105.4 kg    Examination:  General exam: Appears calm and comfortable  Respiratory system: Few expiratory wheezes respiratory effort normal. Cardiovascular system: S1 & S2 heard, RRR.  Gastrointestinal system: Abdomen is nondistended, soft and nontender.  Central nervous system: Alert and oriented. No focal  neurological deficits. Extremities: Symmetric  Skin: No rashes Psychiatry: Judgement and insight appear normal. Mood & affect appropriate.     Data Reviewed: I have personally reviewed following labs and imaging studies  CBC: Recent Labs  Lab 12/07/19 2344 12/07/19 2344 12/08/19 0500 12/08/19 1513 12/09/19 0021 12/10/19 0047 12/11/19 0041  WBC 15.4*  --  13.1*  --  10.4 8.0 8.8  NEUTROABS 13.8*  --   --   --   --   --   --   HGB 11.8*   < > 12.0 12.6 11.4* 10.5* 11.3*  HCT 38.1   < > 39.5 37.0 37.1 34.5* 36.8  MCV 90.1  --  90.2  --  90.7 89.4 90.6  PLT 194  --  205  --  190 199 252   < > = values in this interval not displayed.   Basic Metabolic Panel: Recent Labs  Lab 12/07/19 2344 12/07/19 2344 12/08/19 0500 12/08/19 1513 12/09/19 0021 12/10/19 0047 12/11/19 0041  NA 127*   < > 130* 132* 133* 136 137  K 3.5   < > 4.3 3.7 3.9 3.8 4.0  CL 95*  --  96*  --  100 103 103  CO2 19*  --  20*  --  22 22 24   GLUCOSE 246*  --  162*  --  75 128* 143*  BUN 25*  --  28*  --  25* 24* 20  CREATININE 1.89*  --  1.90*  --  1.73* 1.53* 1.28*  CALCIUM 9.0  --  8.9  --  9.3 9.2 9.5   < > = values in this interval not displayed.   GFR: Estimated Creatinine Clearance: 40.9 mL/min (A) (by C-G formula based on SCr of 1.28 mg/dL (H)). Liver Function Tests: Recent Labs  Lab 12/07/19 2344  AST 27  ALT 20  ALKPHOS 59  BILITOT 1.1  PROT 6.9  ALBUMIN 2.8*   Coagulation Profile: Recent Labs  Lab 12/07/19 2344  INR 2.2*   CBG: Recent Labs  Lab 12/10/19 1059 12/10/19 1607 12/10/19 2118 12/11/19 0617 12/11/19 1103  GLUCAP 191* 166* 209* 140* 138*   Sepsis Labs: Recent Labs  Lab 12/07/19 2248 12/08/19 0510 12/08/19 0930 12/08/19 1952  LATICACIDVEN 3.1* 2.5* 2.1* 1.1    Recent Results (from the past 240 hour(s))  Culture, blood (Routine x 2)     Status: None (Preliminary result)   Collection Time: 12/07/19 11:15 PM   Specimen: BLOOD RIGHT FOREARM  Result Value  Ref Range Status   Specimen Description BLOOD RIGHT FOREARM  Final   Special Requests   Final    BOTTLES DRAWN AEROBIC AND ANAEROBIC Blood Culture adequate volume   Culture   Final    NO GROWTH 3 DAYS Performed at [facility], [place] [address], [place], [place] [address]    Report Status PENDING  Incomplete  Resp Panel by RT PCR (RSV, Flu A&B, Covid) - Nasopharyngeal Swab     Status: None   Collection Time: 12/08/19  3:45 AM   Specimen: Nasopharyngeal Swab  Result Value Ref Range Status   SARS Coronavirus 2 by RT PCR NEGATIVE NEGATIVE Final    Comment: (NOTE) SARS-CoV-2 target nucleic acids are NOT DETECTED.  The SARS-CoV-2 RNA is generally detectable in upper respiratoy specimens during the acute phase of infection. The lowest concentration of SARS-CoV-2 viral copies this assay can detect is 131 copies/mL. A negative result does not preclude SARS-Cov-2 infection and should not be used as the sole basis for treatment or other patient management decisions. A negative result may occur with  improper specimen collection/handling, submission of specimen other than nasopharyngeal swab, presence of viral mutation(s) within the areas targeted by this assay, and inadequate number of viral copies (<131 copies/mL). A negative result must be combined with clinical observations, patient history, and epidemiological information. The expected result is Negative.  Fact Sheet for Patients:  PinkCheek.be  Fact Sheet for Healthcare Providers:  GravelBags.it  This test is no t yet approved or cleared by the [place] FDA and  has been authorized for detection and/or diagnosis of SARS-CoV-2 by FDA under an Emergency Use Authorization (EUA). This EUA will remain  in effect (meaning this test can be used) for the duration of the COVID-19 declaration under Section 564(b)(1) of the Act, 21 U.S.C. section 360bbb-3(b)(1), unless the  authorization is terminated or revoked sooner.     Influenza A by PCR NEGATIVE NEGATIVE Final   Influenza B by PCR NEGATIVE NEGATIVE Final    Comment: (NOTE) The Xpert Xpress SARS-CoV-2/FLU/RSV assay is intended as an aid in  the diagnosis of influenza from Nasopharyngeal swab specimens and  should not be used as a sole basis for treatment. Nasal washings and  aspirates are unacceptable  for Xpert Xpress SARS-CoV-2/FLU/RSV  testing.  Fact Sheet for Patients: PinkCheek.be  Fact Sheet for Healthcare Providers: GravelBags.it  This test is not yet approved or cleared by the [place] FDA and  has been authorized for detection and/or diagnosis of SARS-CoV-2 by  FDA under an Emergency Use Authorization (EUA). This EUA will remain  in effect (meaning this test can be used) for the duration of the  Covid-19 declaration under Section 564(b)(1) of the Act, 21  U.S.C. section 360bbb-3(b)(1), unless the authorization is  terminated or revoked.    Respiratory Syncytial Virus by PCR NEGATIVE NEGATIVE Final    Comment: (NOTE) Fact Sheet for Patients: PinkCheek.be  Fact Sheet for Healthcare Providers: GravelBags.it  This test is not yet approved or cleared by the [place] FDA and  has been authorized for detection and/or diagnosis of SARS-CoV-2 by  FDA under an Emergency Use Authorization (EUA). This EUA will remain  in effect (meaning this test can be used) for the duration of the  COVID-19 declaration under Section 564(b)(1) of the Act, 21 U.S.C.  section 360bbb-3(b)(1), unless the authorization is terminated or  revoked. Performed at [facility], [place] [address] [place], [place] [address]   Urine culture     Status: Abnormal   Collection Time: 12/08/19  4:27 AM   Specimen: Urine, Random  Result Value Ref Range Status   Specimen Description URINE, RANDOM   Final   Special Requests NONE  Final   Culture (A)  Final    <10,000 COLONIES/mL INSIGNIFICANT GROWTH Performed at [facility], [place] [address] [place], [place] [address]    Report Status 12/09/2019 FINAL  Final  MRSA PCR Screening     Status: Abnormal   Collection Time: 12/08/19  7:55 PM   Specimen: Nasal Mucosa; Nasopharyngeal  Result Value Ref Range Status   MRSA by PCR POSITIVE (A) NEGATIVE Final    Comment:        The GeneXpert MRSA Assay (FDA approved for NASAL specimens only), is one component of a comprehensive MRSA colonization surveillance program. It is not intended to diagnose MRSA infection nor to guide or monitor treatment for MRSA infections. RESULT CALLED TO, READ BACK BY AND VERIFIED WITH: Alice Rieger RN 12/09/19 AT 0025 SK Performed at [facility], [place] [address], [place], [place] [address]   Gastrointestinal Panel by PCR , Stool     Status: None   Collection Time: 12/09/19  8:15 AM   Specimen: Stool  Result Value Ref Range Status   Campylobacter species NOT DETECTED NOT DETECTED Final   Plesimonas shigelloides NOT DETECTED NOT DETECTED Final   Salmonella species NOT DETECTED NOT DETECTED Final   Yersinia enterocolitica NOT DETECTED NOT DETECTED Final   Vibrio species NOT DETECTED NOT DETECTED Final   Vibrio cholerae NOT DETECTED NOT DETECTED Final   Enteroaggregative E coli (EAEC) NOT DETECTED NOT DETECTED Final   Enteropathogenic E coli (EPEC) NOT DETECTED NOT DETECTED Final   Enterotoxigenic E coli (ETEC) NOT DETECTED NOT DETECTED Final   Shiga like toxin producing E coli (STEC) NOT DETECTED NOT DETECTED Final   Shigella/Enteroinvasive E coli (EIEC) NOT DETECTED NOT DETECTED Final   Cryptosporidium NOT DETECTED NOT DETECTED Final   Cyclospora cayetanensis NOT DETECTED NOT DETECTED Final   Entamoeba histolytica NOT DETECTED NOT DETECTED Final   Giardia lamblia NOT DETECTED NOT DETECTED Final   Adenovirus F40/41 NOT DETECTED NOT DETECTED Final     Astrovirus NOT DETECTED NOT  DETECTED Final   Norovirus GI/GII NOT DETECTED NOT DETECTED Final   Rotavirus A NOT DETECTED NOT DETECTED Final   Sapovirus (I, II, IV, and V) NOT DETECTED NOT DETECTED Final    Comment: Performed at [facility], [place], [place] [address]  C Difficile Quick Screen w PCR reflex     Status: None   Collection Time: 12/09/19  8:15 AM   Specimen: STOOL  Result Value Ref Range Status   C Diff antigen NEGATIVE NEGATIVE Final   C Diff toxin NEGATIVE NEGATIVE Final   C Diff interpretation No C. difficile detected.  Final    Comment: Performed at [facility], [place] [address] [place], [place] [address]      Radiology Studies: No results found.   Scheduled Meds: . apixaban  5 mg Oral BID  . bisoprolol  5 mg Oral Daily  . Chlorhexidine Gluconate Cloth  6 each Topical Q0600  . cholecalciferol  2,000 Units Oral Daily  . [START ON 12/12/2019] diltiazem  240 mg Oral Daily  . diltiazem  60 mg Oral Q6H  . insulin aspart  0-5 Units Subcutaneous QHS  . insulin aspart  0-9 Units Subcutaneous TID WC  . iron polysaccharides  150 mg Oral Daily  . levothyroxine  112 mcg Oral Q0600  . loratadine  10 mg Oral Daily  . magnesium oxide  200 mg Oral QODAY  . multivitamin with minerals  1 tablet Oral Daily  . mupirocin ointment  1 application Nasal BID  . simvastatin  20 mg Oral Q supper   Continuous Infusions: . cefTRIAXone (ROCEPHIN)  IV 2 g (12/11/19 1100)     LOS: 3 days    Donnamae Jude, MD 12/11/2019 2:26 PM [phone] [facility] If 7PM-7AM, please contact night-coverage 12/11/2019, 2:26 PM

## 2019-12-11 NOTE — Discharge Instructions (Signed)

## 2019-12-11 NOTE — Plan of Care (Signed)

## 2019-12-12 ENCOUNTER — Telehealth: Payer: Self-pay

## 2019-12-12 DIAGNOSIS — I10 Essential (primary) hypertension: Secondary | ICD-10-CM

## 2019-12-12 DIAGNOSIS — N179 Acute kidney failure, unspecified: Secondary | ICD-10-CM

## 2019-12-12 LAB — BASIC METABOLIC PANEL
Anion gap: 10 (ref 5–15)
BUN: 22 mg/dL (ref 8–23)
CO2: 24 mmol/L (ref 22–32)
Calcium: 9.6 mg/dL (ref 8.9–10.3)
Chloride: 103 mmol/L (ref 98–111)
Creatinine, Ser: 1.37 mg/dL — ABNORMAL HIGH (ref 0.44–1.00)
GFR calc Af Amer: 42 mL/min — ABNORMAL LOW (ref >60)
GFR calc non Af Amer: 36 mL/min — ABNORMAL LOW (ref >60)
Glucose, Bld: 146 mg/dL — ABNORMAL HIGH (ref 70–99)
Potassium: 4.2 mmol/L (ref 3.5–5.1)
Sodium: 137 mmol/L (ref 135–145)

## 2019-12-12 LAB — GLUCOSE, CAPILLARY
Glucose-Capillary: 143 mg/dL — ABNORMAL HIGH (ref 70–99)
Glucose-Capillary: 205 mg/dL — ABNORMAL HIGH (ref 70–99)
Glucose-Capillary: 133 mg/dL — ABNORMAL HIGH (ref 70–99)
Glucose-Capillary: 144 mg/dL — ABNORMAL HIGH (ref 70–99)

## 2019-12-12 LAB — CBC
HCT: 37.7 % (ref 36.0–46.0)
Hemoglobin: 11.3 g/dL — ABNORMAL LOW (ref 12.0–15.0)
MCH: 27.1 pg (ref 26.0–34.0)
MCHC: 30 g/dL (ref 30.0–36.0)
MCV: 90.4 fL (ref 80.0–100.0)
Platelets: 357 10*3/uL (ref 150–400)
RBC: 4.17 MIL/uL (ref 3.87–5.11)
RDW: 26.1 % — ABNORMAL HIGH (ref 11.5–15.5)
WBC: 9.2 10*3/uL (ref 4.0–10.5)
nRBC: 0 % (ref 0.0–0.2)

## 2019-12-12 NOTE — Telephone Encounter (Signed)
Thank you for letting me know. I reviewed notes and see that she is admitted for UTI with sepsis, which set off her A fib.  Please call her daughter give her my advice to cancel next week's procedures because [name] will not be well enough for them by them.  Hgb 11.3, so not severely anemic and the EGD/colonoscopy will have to wait.  Best plan is for daughter call us after discharge to arrange clinic follow up with [place] several week later to re-evaluate patient's condition and proceed accordingly.

## 2019-12-12 NOTE — Progress Notes (Signed)
Progress Note  Patient Name: Miranda Wilkinson Date of Encounter: 12/12/2019  Primary Cardiologist: Carlyle Dolly, MD  Subjective   No issues overnight- remains in rate-controlled afib  Inpatient Medications    Scheduled Meds: . apixaban  5 mg Oral BID  . bisoprolol  5 mg Oral Daily  . Chlorhexidine Gluconate Cloth  6 each Topical Q0600  . cholecalciferol  2,000 Units Oral Daily  . diltiazem  240 mg Oral Daily  . insulin aspart  0-5 Units Subcutaneous QHS  . insulin aspart  0-9 Units Subcutaneous TID WC  . iron polysaccharides  150 mg Oral Daily  . levothyroxine  112 mcg Oral Q0600  . loratadine  10 mg Oral Daily  . magnesium oxide  200 mg Oral QODAY  . multivitamin with minerals  1 tablet Oral Daily  . mupirocin ointment  1 application Nasal BID  . simvastatin  20 mg Oral Q supper   Continuous Infusions: . cefTRIAXone (ROCEPHIN)  IV 2 g (12/11/19 1100)   PRN Meds: acetaminophen **OR** acetaminophen, albuterol, docusate sodium, melatonin, ondansetron (ZOFRAN) IV, pantoprazole   Vital Signs    Vitals:   12/11/19 1925 12/11/19 2329 12/12/19 0400 12/12/19 0738  BP: (!) 148/78 120/62  (!) 158/84  Pulse:  83  78  Resp: 20 18 20 18   Temp: 97.8 F (36.6 C) 98 F (36.7 C) 98.5 F (36.9 C) 97.6 F (36.4 C)  TempSrc: Oral Oral Oral Oral  SpO2: 95% 94%  97%  Weight:      Height:        Intake/Output Summary (Last 24 hours) at 12/12/2019 0829 Last data filed at 12/12/2019 0500 Gross per 24 hour  Intake 480 ml  Output 2600 ml  Net -2120 ml   Filed Weights   12/07/19 2249 12/08/19 1940  Weight: 99.8 kg 105.4 kg    Telemetry    Atrial fibrillation rate-controlled -  Personally reviewed.  ECG   N/A  Physical Exam   GEN:  Elderly woman.  No acute distress.   Neck: No JVD. Cardiac:  Irregularly irregular, no gallop, rate-controlled.  Respiratory:  Expiratory wheeze. GI: Soft, nontender, bowel sounds present. MS: Ankle edema; No deformity.  Labs      Chemistry Recent Labs  Lab 12/07/19 2344 12/08/19 0500 12/09/19 0021 12/10/19 0047 12/11/19 0041  NA 127*   < > 133* 136 137  K 3.5   < > 3.9 3.8 4.0  CL 95*   < > 100 103 103  CO2 19*   < > 22 22 24   GLUCOSE 246*   < > 75 128* 143*  BUN 25*   < > 25* 24* 20  CREATININE 1.89*   < > 1.73* 1.53* 1.28*  CALCIUM 9.0   < > 9.3 9.2 9.5  PROT 6.9  --   --   --   --   ALBUMIN 2.8*  --   --   --   --   AST 27  --   --   --   --   ALT 20  --   --   --   --   ALKPHOS 59  --   --   --   --   BILITOT 1.1  --   --   --   --   GFRNONAA 24*   < > 27* 31* 39*  GFRAA 28*   < > 31* 36* 45*  ANIONGAP 13   < > 11 11 10    < > =  values in this interval not displayed.     Hematology Recent Labs  Lab 12/09/19 0021 12/10/19 0047 12/11/19 0041  WBC 10.4 8.0 8.8  RBC 4.09 3.86* 4.06  HGB 11.4* 10.5* 11.3*  HCT 37.1 34.5* 36.8  MCV 90.7 89.4 90.6  MCH 27.9 27.2 27.8  MCHC 30.7 30.4 30.7  RDW Not Measured Not Measured Not Measured  PLT 190 199 252    BNP Recent Labs  Lab 12/08/19 0510 12/09/19 0021  BNP 755.4* 714.3*     Radiology    No results found.  Patient Profile     84 y.o. female with a history of paroxysmal to persistent atrial fibrillation/flutter, hypertension, type 2 diabetes mellitus, hypothyroidism, OSA, and GERD.  Assessment & Plan    1.  Persistent atrial fibrillation with recent RVR in the setting of comorbid illnesses including COPD with acute hypoxic respiratory failure and UTI.  She has a history of paroxysmal to persistent atrial fibrillation/flutter, most recently on Tikosyn and Eliquis.  Chart reviewed with recommendations from Dr. Audie Box.  Tikosyn discontinued due to fluctuating renal dysfunction with plan for heart rate control at this time.  IV diltiazem discontinued with transition to oral diltiazem 240 mg daily today.  Follow-up echocardiogram shows normal LVEF at 60 to 65%.  2.  Acute hypoxic respiratory failure with COPD - improving, slight wheeze  today.  3.  UTI with associated acute kidney injury.  Creatinine has come down to 1.28 today.  4.  Essential hypertension, home bisoprolol restarted, but losartan has been held.  Systolics 338V to 291B. May consider resumption when renal function reaches baseline if hypertension persists.  Probably close to d/c if rates remain stable today from a cardiac standpoint on long-acting cardizem.  Pixie Casino, MD, [facility], [place] Director of the [facility] Lipid [facility]  [facility] Diplomate of the American Board of Clinical Lipidology Attending Cardiologist  Direct Dial: [phone]  Fax: [phone]  Website:  [email or web address]  Pixie Casino, MD  12/12/2019, 8:29 AM

## 2019-12-12 NOTE — Telephone Encounter (Signed)
Dr. Loletha Carrow:  This patient was admitted to hospital 9/29 for Sepsis and is still there.    She is scheduled for endo/colon on 10/12.  We had been waiting on several clearances for her.  Please advise.    Thank you

## 2019-12-12 NOTE — Progress Notes (Signed)
PROGRESS NOTE    Miranda Wilkinson  GEX:[id number] DOB: 01/22/1936 DOA: 12/07/2019 PCP: Caryl Bis, MD (Confirm with patient/family/NH records and if not entered, this HAS to be entered at [facility] point of entry. "No PCP" if truly none.)   Brief Narrative: (Start on day 1 of progress note - keep it brief and live) Patient is a 84 year old Caucasian female with history of hypertension, hyperlipidemia, persistent atrial fibrillation on Tikosyn and Eliquis, hypothyroidism, obstructive sleep apnea and CKD stage III who was brought to ED for fever, generalized weakness and diarrhea and found to have sepsis secondary to UTI and atrial fibrillation with RVR.  Patient was managed with IV ceftriaxone for sepsis secondary to UTI and managed with Cardizem drip for atrial fibrillation with RVR that is changed to oral diltiazem.  Patient also had acute kidney injury on CKD that is improving.   Assessment & Plan:   Principal Problem:   Severe sepsis ([place])  Active Problems:   Essential hypertension, benign   Diarrhea   AKI (acute kidney injury) ([place])   Atrial fibrillation with rapid ventricular response (HCC)   Sepsis ([place])   Severe sepsis secondary to UTI present on admission, improving  Patient was meeting criteria for severe sepsis secondary to UTI with acute kidney injury, tachypnea and tachycardia.  No fever recorded in the last 24 hours.  Acute kidney injury is also improving.  Continue IV ceftriaxone.  If the condition remained stable patient will be most probably discharge home tomorrow with oral antibiotics for completing the course.  Lactic acidosis is resolved.  Atrial fibrillation with RVR, resolved Patient has a history of persistent atrial fibrillation and was found to be in atrial fibrillation with RVR on arrival to the hospital.  Patient was managed with Cardizem drip and home Tikosyn was halted because of acute kidney injury.  Patient is started on Cardizem to 40 mg daily.  Continue  Eliquis for anticoagulation.  Acute hypoxic respiratory failure, improved Patient is not in any respiratory distress and saturating well on room air..  Continue to monitor  AKI on CKD stage III Creatinine level was 1.8 on arrival to the hospital and it is improved to 1.3 today.  Continue to monitor  Hypertension  Patient is hemodynamically stable and blood pressure is well controlled with bisoprolol and Cardizem.  Hyperlipidemia Continue home statin  Hypothyroidism Continue levothyroxine 112 MCG daily  Chronic iron deficiency anemia Patient is chronically on iron supplementation.  Hemoglobin is 11.3 today  Obstructive sleep apnea Continue CPAP   DVT prophylaxis: Continue Eliquis Code Status: Full code Family Communication: No family member present at bedside.  Disposition Plan: PT/OT evaluation is pending and will follow their recommendations   Consultants:   Cardiology  Procedures: (Don't include imaging studies which can be auto populated. Include things that cannot be auto populated i.e. Echo, Carotid and venous dopplers, Foley, Bipap, HD, tubes/drains, wound vac, central lines etc)  Echocardiogram done that showed ejection fraction of 60 to 65% with mild left ventricular hypertrophy.  Antimicrobials: (specify start and planned stop date. Auto populated tables are space occupying and do not give end dates  Ceftriaxone   Subjective: Patient seen and evaluated at the bedside.  Patient is laying comfortably in the bed.  No acute events overnight reported by patient or the nursing staff.  Patient is saturating well on room air.  Patient complaining of mild generalized weakness otherwise denies fever, chills, chest pain shortness of breath, nausea, vomiting, abdominal pain and urinary symptoms.  Objective: Vitals:   12/11/19 2329 12/12/19 0400 12/12/19 0738 12/12/19 1056  BP: 120/62  (!) 158/84 139/81  Pulse: 83  78 98  Resp: 18 20 18 19   Temp: 98 F (36.7 C) 98.5  F (36.9 C) 97.6 F (36.4 C) 97.6 F (36.4 C)  TempSrc: Oral Oral Oral Oral  SpO2: 94%  97% 96%  Weight:      Height:        Intake/Output Summary (Last 24 hours) at 12/12/2019 1152 Last data filed at 12/12/2019 0500 Gross per 24 hour  Intake 240 ml  Output 1400 ml  Net -1160 ml   Filed Weights   12/07/19 2249 12/08/19 1940  Weight: 99.8 kg 105.4 kg    Examination:  General exam: Appears calm and comfortable  Respiratory system: Mild bilateral lower lobe wheezing on expiration. Respiratory effort normal.  Not in acute respiratory distress. Cardiovascular system: Irregularly irregular rhythm with normal rate. No JVD, murmurs, rubs, gallops or clicks. No pedal edema. Gastrointestinal system: Abdomen is nondistended, soft and nontender. No organomegaly or masses felt. Normal bowel sounds heard. Central nervous system: Alert and oriented. No focal neurological deficits. Extremities: Symmetric 5 x 5 power. Skin: No rashes, lesions or ulcers Psychiatry: Judgement and insight appear normal. Mood & affect appropriate.     Data Reviewed: I have personally reviewed following labs and imaging studies  CBC: Recent Labs  Lab 12/07/19 2344 12/07/19 2344 12/08/19 0500 12/08/19 0500 12/08/19 1513 12/09/19 0021 12/10/19 0047 12/11/19 0041 12/12/19 0736  WBC 15.4*   < > 13.1*  --   --  10.4 8.0 8.8 9.2  NEUTROABS 13.8*  --   --   --   --   --   --   --   --   HGB 11.8*   < > 12.0   < > 12.6 11.4* 10.5* 11.3* 11.3*  HCT 38.1   < > 39.5   < > 37.0 37.1 34.5* 36.8 37.7  MCV 90.1   < > 90.2  --   --  90.7 89.4 90.6 90.4  PLT 194   < > 205  --   --  190 199 252 357   < > = values in this interval not displayed.   Basic Metabolic Panel: Recent Labs  Lab 12/08/19 0500 12/08/19 0500 12/08/19 1513 12/09/19 0021 12/10/19 0047 12/11/19 0041 12/12/19 0736  NA 130*   < > 132* 133* 136 137 137  K 4.3   < > 3.7 3.9 3.8 4.0 4.2  CL 96*  --   --  100 103 103 103  CO2 20*  --   --  22  22 24 24   GLUCOSE 162*  --   --  75 128* 143* 146*  BUN 28*  --   --  25* 24* 20 22  CREATININE 1.90*  --   --  1.73* 1.53* 1.28* 1.37*  CALCIUM 8.9  --   --  9.3 9.2 9.5 9.6   < > = values in this interval not displayed.   GFR: Estimated Creatinine Clearance: 38.2 mL/min (A) (by C-G formula based on SCr of 1.37 mg/dL (H)). Liver Function Tests: Recent Labs  Lab 12/07/19 2344  AST 27  ALT 20  ALKPHOS 59  BILITOT 1.1  PROT 6.9  ALBUMIN 2.8*   No results for input(s): LIPASE, AMYLASE in the last 168 hours. No results for input(s): AMMONIA in the last 168 hours. Coagulation Profile: Recent Labs  Lab 12/07/19 2344  INR 2.2*   Cardiac Enzymes: No results for input(s): CKTOTAL, CKMB, CKMBINDEX, TROPONINI in the last 168 hours. BNP (last 3 results) No results for input(s): PROBNP in the last 8760 hours. HbA1C: No results for input(s): HGBA1C in the last 72 hours. CBG: Recent Labs  Lab 12/11/19 1103 12/11/19 1557 12/11/19 2122 12/12/19 0608 12/12/19 1100  GLUCAP 138* 136* 137* 143* 205*   Lipid Profile: No results for input(s): CHOL, HDL, LDLCALC, TRIG, CHOLHDL, LDLDIRECT in the last 72 hours. Thyroid Function Tests: No results for input(s): TSH, T4TOTAL, FREET4, T3FREE, THYROIDAB in the last 72 hours. Anemia Panel: No results for input(s): VITAMINB12, FOLATE, FERRITIN, TIBC, IRON, RETICCTPCT in the last 72 hours. Sepsis Labs: Recent Labs  Lab 12/07/19 2248 12/08/19 0510 12/08/19 0930 12/08/19 1952  LATICACIDVEN 3.1* 2.5* 2.1* 1.1    Recent Results (from the past 240 hour(s))  Culture, blood (Routine x 2)     Status: None (Preliminary result)   Collection Time: 12/07/19 11:15 PM   Specimen: BLOOD RIGHT FOREARM  Result Value Ref Range Status   Specimen Description BLOOD RIGHT FOREARM  Final   Special Requests   Final    BOTTLES DRAWN AEROBIC AND ANAEROBIC Blood Culture adequate volume   Culture   Final    NO GROWTH 4 DAYS Performed at [facility], [place] [address], [place], [place] [address]    Report Status PENDING  Incomplete  Resp Panel by RT PCR (RSV, Flu A&B, Covid) - Nasopharyngeal Swab     Status: None   Collection Time: 12/08/19  3:45 AM   Specimen: Nasopharyngeal Swab  Result Value Ref Range Status   SARS Coronavirus 2 by RT PCR NEGATIVE NEGATIVE Final    Comment: (NOTE) SARS-CoV-2 target nucleic acids are NOT DETECTED.  The SARS-CoV-2 RNA is generally detectable in upper respiratoy specimens during the acute phase of infection. The lowest concentration of SARS-CoV-2 viral copies this assay can detect is 131 copies/mL. A negative result does not preclude SARS-Cov-2 infection and should not be used as the sole basis for treatment or other patient management decisions. A negative result may occur with  improper specimen collection/handling, submission of specimen other than nasopharyngeal swab, presence of viral mutation(s) within the areas targeted by this assay, and inadequate number of viral copies (<131 copies/mL). A negative result must be combined with clinical observations, patient history, and epidemiological information. The expected result is Negative.  Fact Sheet for Patients:  PinkCheek.be  Fact Sheet for Healthcare Providers:  GravelBags.it  This test is no t yet approved or cleared by the [place] FDA and  has been authorized for detection and/or diagnosis of SARS-CoV-2 by FDA under an Emergency Use Authorization (EUA). This EUA will remain  in effect (meaning this test can be used) for the duration of the COVID-19 declaration under Section 564(b)(1) of the Act, 21 U.S.C. section 360bbb-3(b)(1), unless the authorization is terminated or revoked sooner.     Influenza A by PCR NEGATIVE NEGATIVE Final   Influenza B by PCR NEGATIVE NEGATIVE Final    Comment: (NOTE) The Xpert Xpress SARS-CoV-2/FLU/RSV assay is intended as an aid in  the  diagnosis of influenza from Nasopharyngeal swab specimens and  should not be used as a sole basis for treatment. Nasal washings and  aspirates are unacceptable for Xpert Xpress SARS-CoV-2/FLU/RSV  testing.  Fact Sheet for Patients: PinkCheek.be  Fact Sheet for Healthcare Providers: GravelBags.it  This test is not yet approved or cleared by the [place]  FDA and  has been authorized for detection and/or diagnosis of SARS-CoV-2 by  FDA under an Emergency Use Authorization (EUA). This EUA will remain  in effect (meaning this test can be used) for the duration of the  Covid-19 declaration under Section 564(b)(1) of the Act, 21  U.S.C. section 360bbb-3(b)(1), unless the authorization is  terminated or revoked.    Respiratory Syncytial Virus by PCR NEGATIVE NEGATIVE Final    Comment: (NOTE) Fact Sheet for Patients: PinkCheek.be  Fact Sheet for Healthcare Providers: GravelBags.it  This test is not yet approved or cleared by the [place] FDA and  has been authorized for detection and/or diagnosis of SARS-CoV-2 by  FDA under an Emergency Use Authorization (EUA). This EUA will remain  in effect (meaning this test can be used) for the duration of the  COVID-19 declaration under Section 564(b)(1) of the Act, 21 U.S.C.  section 360bbb-3(b)(1), unless the authorization is terminated or  revoked. Performed at [facility], [place] [address], [place], [place] [address]   Urine culture     Status: Abnormal   Collection Time: 12/08/19  4:27 AM   Specimen: Urine, Random  Result Value Ref Range Status   Specimen Description URINE, RANDOM  Final   Special Requests NONE  Final   Culture (A)  Final    <10,000 COLONIES/mL INSIGNIFICANT GROWTH Performed at [facility], [place] [address] [place], [place] [address]    Report Status 12/09/2019 FINAL  Final  MRSA PCR  Screening     Status: Abnormal   Collection Time: 12/08/19  7:55 PM   Specimen: Nasal Mucosa; Nasopharyngeal  Result Value Ref Range Status   MRSA by PCR POSITIVE (A) NEGATIVE Final    Comment:        The GeneXpert MRSA Assay (FDA approved for NASAL specimens only), is one component of a comprehensive MRSA colonization surveillance program. It is not intended to diagnose MRSA infection nor to guide or monitor treatment for MRSA infections. RESULT CALLED TO, READ BACK BY AND VERIFIED WITH: Alice Rieger RN 12/09/19 AT 0025 SK Performed at [facility], [place] [address], [place], [place] [address]   Gastrointestinal Panel by PCR , Stool     Status: None   Collection Time: 12/09/19  8:15 AM   Specimen: Stool  Result Value Ref Range Status   Campylobacter species NOT DETECTED NOT DETECTED Final   Plesimonas shigelloides NOT DETECTED NOT DETECTED Final   Salmonella species NOT DETECTED NOT DETECTED Final   Yersinia enterocolitica NOT DETECTED NOT DETECTED Final   Vibrio species NOT DETECTED NOT DETECTED Final   Vibrio cholerae NOT DETECTED NOT DETECTED Final   Enteroaggregative E coli (EAEC) NOT DETECTED NOT DETECTED Final   Enteropathogenic E coli (EPEC) NOT DETECTED NOT DETECTED Final   Enterotoxigenic E coli (ETEC) NOT DETECTED NOT DETECTED Final   Shiga like toxin producing E coli (STEC) NOT DETECTED NOT DETECTED Final   Shigella/Enteroinvasive E coli (EIEC) NOT DETECTED NOT DETECTED Final   Cryptosporidium NOT DETECTED NOT DETECTED Final   Cyclospora cayetanensis NOT DETECTED NOT DETECTED Final   Entamoeba histolytica NOT DETECTED NOT DETECTED Final   Giardia lamblia NOT DETECTED NOT DETECTED Final   Adenovirus F40/41 NOT DETECTED NOT DETECTED Final   Astrovirus NOT DETECTED NOT DETECTED Final   Norovirus GI/GII NOT DETECTED NOT DETECTED Final   Rotavirus A NOT DETECTED NOT DETECTED Final   Sapovirus (I, II, IV, and V) NOT DETECTED NOT DETECTED Final  Comment: Performed at  [facility], [address] [place], [place] [address]  C Difficile Quick Screen w PCR reflex     Status: None   Collection Time: 12/09/19  8:15 AM   Specimen: STOOL  Result Value Ref Range Status   C Diff antigen NEGATIVE NEGATIVE Final   C Diff toxin NEGATIVE NEGATIVE Final   C Diff interpretation No C. difficile detected.  Final    Comment: Performed at [facility], [place] [address], [place], [place] [address]         Radiology Studies: No results found.      Scheduled Meds: . apixaban  5 mg Oral BID  . bisoprolol  5 mg Oral Daily  . Chlorhexidine Gluconate Cloth  6 each Topical Q0600  . cholecalciferol  2,000 Units Oral Daily  . diltiazem  240 mg Oral Daily  . insulin aspart  0-5 Units Subcutaneous QHS  . insulin aspart  0-9 Units Subcutaneous TID WC  . iron polysaccharides  150 mg Oral Daily  . levothyroxine  112 mcg Oral Q0600  . loratadine  10 mg Oral Daily  . magnesium oxide  200 mg Oral QODAY  . multivitamin with minerals  1 tablet Oral Daily  . mupirocin ointment  1 application Nasal BID  . simvastatin  20 mg Oral Q supper   Continuous Infusions: . cefTRIAXone (ROCEPHIN)  IV 2 g (12/12/19 0951)     LOS: 4 days        Edmonia Lynch, MD [facility]   If 7PM-7AM, please contact night-coverage [email or web address] Password  12/12/2019, 11:52 AM

## 2019-12-12 NOTE — Telephone Encounter (Signed)
Per Dr. Loletha Carrow' direction called daughter, Miranda Wilkinson and given directions to call and schedule office once her mother (the pt) is discharged from the hospital  Current procedure scheduled 10/12 is cancelled and will not be scheduled until pt is seen in office.  Daughter voiced her understanding.

## 2019-12-12 NOTE — Evaluation (Signed)
Physical Therapy Evaluation Patient Details Name: Miranda Wilkinson MRN: [id number] DOB: 1935-12-07 Today's Date: 12/12/2019   History of Present Illness  84 yo admitted with sepsis, weakness, diarrhea, UTI. PMhx: DM, GERD, HTN, HLD, OSA, CKD  Clinical Impression  Pt pleasant but HOH. Pt reports she lives in an in-law suite with daughter and care for herself walking without AD and bathing with assist of daughter. Pt benefits from use of RW for stability and increased distance with continued recommendation for use at D/C. Pt with decreased strength, balance and function who will benefit from acute therapy to maximize mobility, safety and independence to decrease burden of care.   HR 92-104 SpO2 89-93% on RA    Follow Up Recommendations Home health PT    Equipment Recommendations  Rolling walker with 5" wheels    Recommendations for Other Services       Precautions / Restrictions Precautions Precautions: Fall      Mobility  Bed Mobility Overal bed mobility: Modified Independent             General bed mobility comments: HOB 20 degrees with pt able to pivot to left side of bed  Transfers Overall transfer level: Needs assistance   Transfers: Sit to/from Stand Sit to Stand: Min guard         General transfer comment: guarding for lines and safety  Ambulation/Gait Ambulation/Gait assistance: Min guard Gait Distance (Feet): 80 Feet Assistive device: Rolling walker (2 wheeled) Gait Pattern/deviations: Step-through pattern;Decreased stride length;Trunk flexed   Gait velocity interpretation: 1.31 - 2.62 ft/sec, indicative of limited community ambulator General Gait Details: cues for posture and proximity to RW, limited by fatigue  Stairs            Wheelchair Mobility    Modified Rankin (Stroke Patients Only)       Balance Overall balance assessment: Mild deficits observed, not formally tested                                            Pertinent Vitals/Pain Pain Assessment: No/denies pain    Home Living Family/patient expects to be discharged to:: Private residence Living Arrangements: Children Available Help at Discharge: Family;Available 24 hours/day Type of Home: House Home Access: Stairs to enter   [employer] of Steps: 2 Home Layout: One level Home Equipment: Walker - 4 wheels;Walker - standard;Wheelchair - manual      Prior Function Level of Independence: Needs assistance   Gait / Transfers Assistance Needed: pt reports walking without AD  ADL's / Homemaking Assistance Needed: assist for bathing and some assist for dressing at times. Daughter does the homemaking        Hand Dominance        Extremity/Trunk Assessment   Upper Extremity Assessment Upper Extremity Assessment: Generalized weakness    Lower Extremity Assessment Lower Extremity Assessment: Generalized weakness    Cervical / Trunk Assessment Cervical / Trunk Assessment: Kyphotic  Communication   Communication: HOH  Cognition Arousal/Alertness: Awake/alert Behavior During Therapy: WFL for tasks assessed/performed Overall Cognitive Status: Within Functional Limits for tasks assessed                                        General Comments      Exercises     Assessment/Plan  PT Assessment Patient needs continued PT services  PT Problem List Decreased mobility;Decreased activity tolerance;Decreased balance;Decreased knowledge of use of DME       PT Treatment Interventions Functional mobility training;Stair training;Gait training;Therapeutic activities;Patient/family education;Therapeutic exercise;DME instruction    PT Goals (Current goals can be found in the Care Plan section)  Acute Rehab PT Goals Patient Stated Goal: return home PT Goal Formulation: With patient Time For Goal Achievement: 12/26/19 Potential to Achieve Goals: Good    Frequency Min 3X/week   Barriers to discharge         Co-evaluation               AM-PAC PT "6 Clicks" Mobility  Outcome Measure Help needed turning from your back to your side while in a flat bed without using bedrails?: A Little Help needed moving from lying on your back to sitting on the side of a flat bed without using bedrails?: A Little Help needed moving to and from a bed to a chair (including a wheelchair)?: A Little Help needed standing up from a chair using your arms (e.g., wheelchair or bedside chair)?: A Little Help needed to walk in hospital room?: A Little Help needed climbing 3-5 steps with a railing? : A Little 6 Click Score: 18    End of Session Equipment Utilized During Treatment: Gait belt Activity Tolerance: Patient tolerated treatment well Patient left: in chair;with call bell/phone within reach Nurse Communication: Mobility status PT Visit Diagnosis: Other abnormalities of gait and mobility (R26.89);Muscle weakness (generalized) (M62.81)    Time: 8372-9021 PT Time Calculation (min) (ACUTE ONLY): 29 min   Charges:   PT Evaluation $PT Eval Moderate Complexity: 1 Mod          Aerie Donica P, PT Acute Rehabilitation Services Pager: [phone] Office: [phone]   Byrne Capek B Beonka [place]ita 12/12/2019, 1:17 PM

## 2019-12-13 ENCOUNTER — Other Ambulatory Visit: Payer: Self-pay

## 2019-12-13 ENCOUNTER — Encounter (HOSPITAL_COMMUNITY): Payer: Self-pay | Admitting: Internal Medicine

## 2019-12-13 LAB — CBC
HCT: 37.1 % (ref 36.0–46.0)
Hemoglobin: 11.3 g/dL — ABNORMAL LOW (ref 12.0–15.0)
MCH: 27.4 pg (ref 26.0–34.0)
MCHC: 30.5 g/dL (ref 30.0–36.0)
MCV: 90 fL (ref 80.0–100.0)
Platelets: 391 10*3/uL (ref 150–400)
RBC: 4.12 MIL/uL (ref 3.87–5.11)
RDW: 25.9 % — ABNORMAL HIGH (ref 11.5–15.5)
WBC: 11.3 10*3/uL — ABNORMAL HIGH (ref 4.0–10.5)
nRBC: 0 % (ref 0.0–0.2)

## 2019-12-13 LAB — GLUCOSE, CAPILLARY
Glucose-Capillary: 146 mg/dL — ABNORMAL HIGH (ref 70–99)
Glucose-Capillary: 170 mg/dL — ABNORMAL HIGH (ref 70–99)
Glucose-Capillary: 124 mg/dL — ABNORMAL HIGH (ref 70–99)

## 2019-12-13 LAB — BASIC METABOLIC PANEL
Anion gap: 11 (ref 5–15)
BUN: 22 mg/dL (ref 8–23)
CO2: 25 mmol/L (ref 22–32)
Calcium: 9.4 mg/dL (ref 8.9–10.3)
Chloride: 101 mmol/L (ref 98–111)
Creatinine, Ser: 1.23 mg/dL — ABNORMAL HIGH (ref 0.44–1.00)
GFR calc Af Amer: 47 mL/min — ABNORMAL LOW (ref >60)
GFR calc non Af Amer: 41 mL/min — ABNORMAL LOW (ref >60)
Glucose, Bld: 177 mg/dL — ABNORMAL HIGH (ref 70–99)
Potassium: 3.6 mmol/L (ref 3.5–5.1)
Sodium: 137 mmol/L (ref 135–145)

## 2019-12-13 LAB — CULTURE, BLOOD (ROUTINE X 2)
Culture: NO GROWTH
Special Requests: ADEQUATE

## 2019-12-13 MED ORDER — DILTIAZEM HCL ER COATED BEADS 240 MG PO CP24
240.0000 mg | ORAL_CAPSULE | Freq: Every day | ORAL | 0 refills | Status: DC
Start: 2019-12-14 — End: 2020-01-23

## 2019-12-13 MED ORDER — CEFDINIR 300 MG PO CAPS: 300.0000 mg | ORAL_CAPSULE | Freq: Two times a day (BID) | ORAL | 0 refills | Status: AC

## 2019-12-13 MED ORDER — DILTIAZEM HCL ER COATED BEADS 240 MG PO CP24
240.0000 mg | ORAL_CAPSULE | Freq: Every day | ORAL | 0 refills | Status: DC
Start: 2019-12-14 — End: 2019-12-13

## 2019-12-13 MED ORDER — CEFDINIR 300 MG PO CAPS: 300.0000 mg | ORAL_CAPSULE | Freq: Two times a day (BID) | ORAL | 0 refills | Status: DC

## 2019-12-13 NOTE — Telephone Encounter (Signed)
Patients daughter Lenna Sciara) called and wanted to know why the procedure was canceled Dr. Corena Pilgrim previous notes and recommendations were provided to the daughter. Patient is scheduled for 01/04/2020 for OV. Patient expressed how she is not happy with what she called " The run around" since her last OV with APP said she will come with patient to discuss further with physcian

## 2019-12-13 NOTE — Telephone Encounter (Signed)
Spoke with patient's daughter Lenna Sciara) regarding below. She states that she will accompany patient to her appt on 01/04/20.

## 2019-12-13 NOTE — Telephone Encounter (Signed)
I would be glad to speak with her about it in detail when they both come to the office.  A hospitalization for sepsis leading to A fib rate control problems requires a period of recovery and in-person office re-evaluation by me before proceeding with endoscopic procedures because of the risks inherent to those procedures.  The patient's condition changed, so plans have had to change accordingly.

## 2019-12-13 NOTE — Care Management Important Message (Signed)
Important Message  Patient Details  Name: Miranda Wilkinson MRN: [id number] Date of Birth: Jul 27, 1935   Medicare Important Message Given:  Yes     Orbie Pyo 12/13/2019, 10:55 AM

## 2019-12-13 NOTE — TOC Initial Note (Signed)
Transition of Care [facility]) - Initial/Assessment Note    Patient Details  Name: Miranda Wilkinson MRN: [id number] Date of Birth: 12-Nov-1935  Transition of Care [facility]) CM/SW Contact:    Carles Collet, RN Phone Number: 12/13/2019, 12:31 PM  Clinical Narrative:    Damaris Schooner w patient at bedside to discuss DC plan. She is agreeable to [facility], she does not have a preference of a provider. She lives with her daughter and states that she has a RW and WC at home. Spoke w daughter Lenna Sciara, she states patient lives with her sister [name]. LVM with Renee to further discuss [facility] providers.  Will follow up again w Renee later today.   Sherrilee Gilles Daughter [phone]  [phone]  Catalina Antigua Daughter [phone]  [phone]              Expected Discharge Plan: [place] Barriers to Discharge: Continued Medical Work up   Patient Goals and CMS Choice Patient states their goals for this hospitalization and ongoing recovery are:: to go home CMS Medicare.gov Compare Post Acute Care list provided to:: Patient Choice offered to / list presented to : Patient  Expected Discharge Plan and Services Expected Discharge Plan: [place]   Discharge Planning Services: CM Consult Post Acute Care Choice: Home Health                                        Prior Living Arrangements/Services   Lives with:: Adult Children                   Activities of Daily Living      Permission Sought/Granted                  Emotional Assessment              Admission diagnosis:  Shortness of breath [R06.02] UTI (urinary tract infection) [N39.0] Sepsis ([place]) [A41.9] Sepsis without acute organ dysfunction, due to unspecified organism [facility]) [A41.9] Patient Active Problem List   Diagnosis Date Noted  . Severe sepsis ([place]) 12/08/2019  . Diarrhea 12/08/2019  . AKI (acute kidney injury) ([place]) 12/08/2019  . Atrial fibrillation with rapid  ventricular response ([place]) 12/08/2019  . Sepsis ([place]) 12/08/2019  . Iron deficiency anemia 11/05/2019  . Symptomatic anemia 11/05/2019  . Atypical atrial flutter ([place]) 11/03/2019  . Persistent atrial fibrillation ([place]) 10/14/2019  . Secondary hypercoagulable state ([place]) 10/14/2019  . COPD GOLD 0/ group B 09/26/2019  . Morbid obesity due to excess calories ([place]) comp by hyperlipidemia/ dm/ hbp 09/26/2019  . Depression 11/22/2013  . Hyperlipemia 11/22/2013  . Allergic rhinitis 11/22/2013  . Unspecified hypothyroidism 11/08/2013  . Diabetes ([place]) 11/08/2013  . Essential hypertension, benign 11/08/2013  . Closed right ankle fracture 11/03/2013   PCP:  Caryl Bis, MD Pharmacy:   [facility] [address], [place]  [address] Phone: [phone] Fax: [phone]  [facility] [place], [place] [address] Phone: [phone] Fax: [phone]     Social Determinants of Health (SDOH) Interventions    Readmission Risk Interventions No flowsheet data found.

## 2019-12-13 NOTE — Discharge Summary (Signed)
Physician Discharge Summary  LADEIDRA BORYS TMH:[id number] DOB: 10-31-1935 DOA: 12/07/2019  PCP: Caryl Bis, MD  Admit date: 12/07/2019 Discharge date: 12/13/2019  Admitted From: Home Disposition: Home with home health PT  Recommendations for Outpatient Follow-up:  1. Follow up with PCP in 1-2 weeks 2. Please obtain BMP/CBC in one week 3. Please follow up on the following pending results:  Home Health: Yes Equipment/Devices: Rolling walker  Discharge Condition: Stable CODE STATUS: Full code Diet recommendation: Heart Healthy   Brief/Interim Summary: Patient is a 84 year old Caucasian female with history of hypertension, hyperlipidemia, persistent atrial fibrillation on Tikosyn and Eliquis, hypothyroidism, obstructive sleep apnea and CKD stage III who was brought to ED for fever, generalized weakness and diarrhea and found to have sepsis secondary to UTI and atrial fibrillation with RVR.  Patient was managed with IV ceftriaxone for sepsis secondary to UTI and managed with Cardizem drip for atrial fibrillation with RVR that is changed to oral diltiazem.  j Tikosyn altered by cardiology because of acute kidney injury.  Atrial fibrillation well controlled with oral diltiazem  240 mg daily.  Acute kidney injury is also improved and blood pressure is also well controlled. Cardiology evaluated the patient today and recommended follow-up with Dr. Harl Bowie in 1 to 2 weeks.  PT and OT evaluated the patient and recommended home health services.  Discharge plan is discussed with patient in detail and she verbalized her understanding.   Discharge Diagnoses:  Principal Problem:   Severe sepsis ([place]) Active Problems:   Essential hypertension, benign   Diarrhea   AKI (acute kidney injury) ([place])   Atrial fibrillation with rapid ventricular response ([place])   Sepsis [facility])    Discharge Instructions  Discharge Instructions    Diet - low sodium heart healthy   Complete by: As directed    Discharge  instructions   Complete by: As directed    Take all your medications as prescribed.  Use cefdinir 300 mg twice daily for 3 days complete antibiotic course.  Continue using diltiazem 240 mg once daily and follow-up with your cardiologist Dr. Harl Bowie in a week.  Follow-up with your PCP in a week.   Increase activity slowly   Complete by: As directed      Allergies as of 12/13/2019   No Known Allergies     Medication List    STOP taking these medications   dofetilide 500 MCG capsule Commonly known as: TIKOSYN     TAKE these medications   acetaminophen 500 MG tablet Commonly known as: TYLENOL Take 500-1,000 mg by mouth every 6 (six) hours as needed (for pain.).   Anoro Ellipta 62.5-25 MCG/INH Aepb Generic drug: umeclidinium-vilanterol Only open the device one time and then take your two separate drags to be sure you get it all   B Complex 50 Tabs Take 1 tablet by mouth daily.   bisoprolol 10 MG tablet Commonly known as: ZEBETA Take 1 tablet (10 mg total) by mouth daily.   cefdinir 300 MG capsule Commonly known as: OMNICEF Take 1 capsule (300 mg total) by mouth 2 (two) times daily for 3 days.   cetirizine 10 MG tablet Commonly known as: ZYRTEC Take 10 mg by mouth daily as needed for allergies.   diltiazem 240 MG 24 hr capsule Commonly known as: CARDIZEM CD Take 1 capsule (240 mg total) by mouth daily. Start taking on: December 14, 2019   Eliquis 5 MG Tabs tablet Generic drug: apixaban TAKE 1 TABLET TWICE A DAY What changed:  how much to take  additional instructions   glipiZIDE 10 MG tablet Commonly known as: GLUCOTROL Take 10 mg by mouth daily.   iron polysaccharides 150 MG capsule Commonly known as: Nu-Iron Take 1 capsule (150 mg total) by mouth daily.   levothyroxine 112 MCG tablet Commonly known as: SYNTHROID Take 112 mcg by mouth daily.   losartan 100 MG tablet Commonly known as: COZAAR Take 1 tablet (100 mg total) by mouth daily.   Magnesium 250  MG Tabs Take 250 mg by mouth every other day. In the morning.   melatonin 5 MG Tabs Take 2.5-5 mg by mouth at bedtime as needed (sleep).   metFORMIN 500 MG 24 hr tablet Commonly known as: GLUCOPHAGE-XR Take 1,000 mg by mouth daily with supper.   multivitamin with minerals tablet Take 1 tablet by mouth daily.   Omeprazole 20 MG Tbdd Take 20 mg by mouth as needed (acid reflux). OTC   POTASSIUM GLUCONATE PO Take 595 mg by mouth every other day.   simvastatin 40 MG tablet Commonly known as: ZOCOR Take 20 mg by mouth daily with supper.   Vitamin D3 50 MCG (2000 UT) capsule Take 2,000 Units by mouth daily.            Durable Medical Equipment  (From admission, onward)         Start     Ordered   12/13/19 0707  For home use only DME Walker rolling  Once       Question Answer Comment  Walker: With 5 Inch Wheels   Patient needs a walker to treat with the following condition Chronic respiratory failure ([place])      12/13/19 [place] [place] [facility] Follow up.   Specialty: [place] Why: For home Health Services Contact information: [place] [address] [phone]              No Known Allergies  Consultations:  Dr.Branch/cardiology   Procedures/Studies: DG Chest 2 View  Result Date: 12/07/2019 CLINICAL DATA:  Fever and weakness today. Symptoms worsening despite antibiotics. EXAM: CHEST - 2 VIEW COMPARISON:  11/04/2019 FINDINGS: Shallow inspiration. Cardiac enlargement. Slight interstitial pattern to the lungs is similar to prior study, possibly representing fibrosis or edema. No focal consolidation. No pleural effusions. No pneumothorax. Mediastinal contours appear intact. Calcified and tortuous aorta. Old right rib fractures. Degenerative changes in the spine. IMPRESSION: Cardiac enlargement. Slight interstitial pattern to the lungs suggesting fibrosis or edema. No  focal consolidation. Electronically Signed   By: Lucienne Capers M.D.   On: 12/07/2019 23:17   US RENAL  Result Date: 12/08/2019 CLINICAL DATA:  UTI. EXAM: RENAL / URINARY TRACT ULTRASOUND COMPLETE COMPARISON:  None. FINDINGS: Right Kidney: Renal measurements: 12.3 x 5.0 x 5.4 cm = volume: 174 mL. Echogenicity within normal limits. There is a 3.3 cm simple cyst from the inferior kidney. No solid mass or hydronephrosis visualized. No perirenal fluid collection. Left Kidney: Renal measurements: 13.8 x 6.3 x 5.8 cm = volume: 261 mL. Echogenicity within normal limits. No mass or hydronephrosis visualized. No perirenal fluid collection. Bladder: Appears normal for degree of bladder distention. Other: None. IMPRESSION: Simple cyst in the right kidney. Otherwise unremarkable renal ultrasound. Electronically Signed   By: Keith Rake M.D.   On: 12/08/2019 20:35   DG Chest Port 1 View  Result Date: 12/08/2019 CLINICAL DATA:  Shortness of breath and fever. EXAM: PORTABLE CHEST 1 VIEW COMPARISON:  12/07/2019 FINDINGS: No focal consolidation. Similar mild diffuse bronchitic and interstitial prominence. Low lung volumes. No visible pleural effusion or pneumothorax. Mild enlargement of the cardiac silhouette, similar to prior. Calcific atherosclerosis of the aorta IMPRESSION: 1. No evidence of acute cardiopulmonary abnormality. 2. Similar mild cardiac enlargement and chronic bronchitic and interstitial prominence. Electronically Signed   By: Margaretha Sheffield MD   On: 12/08/2019 14:10   ECHOCARDIOGRAM COMPLETE  Result Date: 12/09/2019    ECHOCARDIOGRAM REPORT   Patient Name:   SKYLAH DELAUTER O'DELL Date of Exam: 12/09/2019 Medical Rec #:  [id number]      Height:       65.0 in Accession #:    [id number]     Weight:       232.4 lb Date of Birth:  11-19-1935      BSA:          2.108 m Patient Age:    84 years       BP:           118/60 mmHg Patient Gender: F              HR:           101 bpm. Exam Location:  Inpatient  Procedure: 2D Echo, Cardiac Doppler and Color Doppler Indications:    ; I48.91* Unspeicified atrial fibrillation  History:        Patient has prior history of Echocardiogram examinations, most                 recent 02/24/2019. Abnormal ECG, COPD, Arrythmias:Atrial                 Fibrillation and Atrial Flutter, Signs/Symptoms:Bacteremia; Risk                 Factors:Hypertension and Dyslipidemia.  Sonographer:    Roseanna Rainbow RDCS Referring Phys: [id number] [facility]  Sonographer Comments: Technically difficult study due to poor echo windows. Image acquisition challenging due to patient body habitus. IMPRESSIONS  1. Left ventricular ejection fraction, by estimation, is 60 to 65%. The left ventricle has normal function. The left ventricle has no regional wall motion abnormalities. There is mild left ventricular hypertrophy. Left ventricular diastolic function could not be evaluated.  2. Right ventricular systolic function is normal. The right ventricular size is normal. There is normal pulmonary artery systolic pressure.  3. The mitral valve is normal in structure. Trivial mitral valve regurgitation. No evidence of mitral stenosis.  4. The aortic valve is tricuspid. Aortic valve regurgitation is not visualized. Mild aortic valve sclerosis is present, with no evidence of aortic valve stenosis.  5. The inferior vena cava is normal in size with greater than 50% respiratory variability, suggesting right atrial pressure of 3 mmHg. FINDINGS  Left Ventricle: Left ventricular ejection fraction, by estimation, is 60 to 65%. The left ventricle has normal function. The left ventricle has no regional wall motion abnormalities. The left ventricular internal cavity size was normal in size. There is  mild left ventricular hypertrophy. Left ventricular diastolic function could not be evaluated due to atrial fibrillation. Left ventricular diastolic function could not be evaluated. Right Ventricle: The right ventricular size is  normal. Right ventricular systolic function is normal. There is normal pulmonary artery systolic pressure. The tricuspid regurgitant velocity is 2.48 m/s, and with an assumed right atrial pressure of 3 mmHg,  the estimated right ventricular systolic pressure is 94.7 mmHg.  Left Atrium: Left atrial size was normal in size. Right Atrium: Right atrial size was normal in size. Pericardium: There is no evidence of pericardial effusion. Mitral Valve: The mitral valve is normal in structure. Trivial mitral valve regurgitation. No evidence of mitral valve stenosis. MV peak gradient, 10.2 mmHg. The mean mitral valve gradient is 3.0 mmHg. Tricuspid Valve: The tricuspid valve is normal in structure. Tricuspid valve regurgitation is mild . No evidence of tricuspid stenosis. Aortic Valve: The aortic valve is tricuspid. Aortic valve regurgitation is not visualized. Mild aortic valve sclerosis is present, with no evidence of aortic valve stenosis. Aortic valve peak gradient measures 25.8 mmHg. Pulmonic Valve: The pulmonic valve was normal in structure. Pulmonic valve regurgitation is not visualized. No evidence of pulmonic stenosis. Aorta: The aortic root is normal in size and structure. Venous: The inferior vena cava is normal in size with greater than 50% respiratory variability, suggesting right atrial pressure of 3 mmHg.   LEFT VENTRICLE PLAX 2D LVIDd:         3.70 cm LVIDs:         2.50 cm LV PW:         1.50 cm LV IVS:        1.50 cm LVOT diam:     1.90 cm LV SV:         53 LV SV Index:   25 LVOT Area:     2.84 cm  LV Volumes (MOD) LV vol d, MOD A2C: 38.5 ml LV vol d, MOD A4C: 36.2 ml LV vol s, MOD A2C: 14.3 ml LV vol s, MOD A4C: 13.9 ml LV SV MOD A2C:     24.2 ml LV SV MOD A4C:     36.2 ml LV SV MOD BP:      22.7 ml RIGHT VENTRICLE             IVC RV S prime:     12.60 cm/s  IVC diam: 2.30 cm TAPSE (M-mode): 2.0 cm LEFT ATRIUM             Index       RIGHT ATRIUM           Index LA diam:        4.50 cm 2.13 cm/m  RA Area:      13.70 cm LA Vol (A2C):   29.1 ml 13.80 ml/m RA Volume:   30.40 ml  14.42 ml/m LA Vol (A4C):   56.1 ml 26.61 ml/m LA Biplane Vol: 41.6 ml 19.73 ml/m  AORTIC VALVE AV Area (Vmax): 1.18 cm AV Vmax:        254.00 cm/s AV Peak Grad:   25.8 mmHg LVOT Vmax:      106.00 cm/s LVOT Vmean:     77.100 cm/s LVOT VTI:       0.186 m  AORTA Ao Root diam: 3.30 cm Ao Asc diam:  3.30 cm MITRAL VALVE                TRICUSPID VALVE MV Area (PHT): 3.21 cm     TR Peak grad:   24.6 mmHg MV Peak grad:  10.2 mmHg    TR Vmax:        248.00 cm/s MV Mean grad:  3.0 mmHg MV Vmax:       1.60 m/s     SHUNTS MV Vmean:      67.3 cm/s    Systemic VTI:  0.19 m MV Decel Time: 236 msec  Systemic Diam: 1.90 cm MV E velocity: 133.00 cm/s Kirk Ruths MD Electronically signed by Kirk Ruths MD Signature Date/Time: 12/09/2019/2:02:33 PM    Final        Subjective:Patient seen and evaluated at the bedside.  Patient is laying comfortably in the bed.  No acute events overnight reported by patient or the nursing staff.  Patient is saturating well on room air.  Patient complaining of mild generalized weakness otherwise denies fever, chills, chest pain shortness of breath, nausea, vomiting, abdominal pain and urinary symptoms.   Discharge Exam: Vitals:   12/13/19 1053 12/13/19 1622  BP: 120/74 (!) 149/70  Pulse: 90 76  Resp: 19 20  Temp: 98 F (36.7 C) 98 F (36.7 C)  SpO2: 93% 92%   Vitals:   12/13/19 0348 12/13/19 0713 12/13/19 1053 12/13/19 1622  BP: (!) 147/74 (!) 141/85 120/74 (!) 149/70  Pulse: 69 86 90 76  Resp: 20 17 19 20   Temp: 97.9 F (36.6 C) 97.8 F (36.6 C) 98 F (36.7 C) 98 F (36.7 C)  TempSrc: Oral Oral Oral Oral  SpO2: 99% 97% 93% 92%  Weight:      Height:        General: Pt is alert, awake, not in acute distress Cardiovascular: Irregularly irregular rhythm with normal rate. No JVD, murmurs, rubs, gallops or clicks. No pedal edema. no rubs, no gallops Respiratory: CTA bilaterally, no  wheezing, no rhonchi Abdominal: Soft, NT, ND, bowel sounds + Extremities: no edema, no cyanosis    The results of significant diagnostics from this hospitalization (including imaging, microbiology, ancillary and laboratory) are listed below for reference.     Microbiology: Recent Results (from the past 240 hour(s))  Culture, blood (Routine x 2)     Status: None   Collection Time: 12/07/19 11:15 PM   Specimen: BLOOD RIGHT FOREARM  Result Value Ref Range Status   Specimen Description BLOOD RIGHT FOREARM  Final   Special Requests   Final    BOTTLES DRAWN AEROBIC AND ANAEROBIC Blood Culture adequate volume   Culture   Final    NO GROWTH 5 DAYS Performed at [facility], [address], [place], [place] [address]    Report Status 12/13/2019 FINAL  Final  Resp Panel by RT PCR (RSV, Flu A&B, Covid) - Nasopharyngeal Swab     Status: None   Collection Time: 12/08/19  3:45 AM   Specimen: Nasopharyngeal Swab  Result Value Ref Range Status   SARS Coronavirus 2 by RT PCR NEGATIVE NEGATIVE Final    Comment: (NOTE) SARS-CoV-2 target nucleic acids are NOT DETECTED.  The SARS-CoV-2 RNA is generally detectable in upper respiratoy specimens during the acute phase of infection. The lowest concentration of SARS-CoV-2 viral copies this assay can detect is 131 copies/mL. A negative result does not preclude SARS-Cov-2 infection and should not be used as the sole basis for treatment or other patient management decisions. A negative result may occur with  improper specimen collection/handling, submission of specimen other than nasopharyngeal swab, presence of viral mutation(s) within the areas targeted by this assay, and inadequate number of viral copies (<131 copies/mL). A negative result must be combined with clinical observations, patient history, and epidemiological information. The expected result is Negative.  Fact Sheet for Patients:  PinkCheek.be  Fact  Sheet for Healthcare Providers:  GravelBags.it  This test is no t yet approved or cleared by the [place] FDA and  has been authorized for detection and/or diagnosis of SARS-CoV-2 by  FDA under an Emergency Use Authorization (EUA). This EUA will remain  in effect (meaning this test can be used) for the duration of the COVID-19 declaration under Section 564(b)(1) of the Act, 21 U.S.C. section 360bbb-3(b)(1), unless the authorization is terminated or revoked sooner.     Influenza A by PCR NEGATIVE NEGATIVE Final   Influenza B by PCR NEGATIVE NEGATIVE Final    Comment: (NOTE) The Xpert Xpress SARS-CoV-2/FLU/RSV assay is intended as an aid in  the diagnosis of influenza from Nasopharyngeal swab specimens and  should not be used as a sole basis for treatment. Nasal washings and  aspirates are unacceptable for Xpert Xpress SARS-CoV-2/FLU/RSV  testing.  Fact Sheet for Patients: PinkCheek.be  Fact Sheet for Healthcare Providers: GravelBags.it  This test is not yet approved or cleared by the [place] FDA and  has been authorized for detection and/or diagnosis of SARS-CoV-2 by  FDA under an Emergency Use Authorization (EUA). This EUA will remain  in effect (meaning this test can be used) for the duration of the  Covid-19 declaration under Section 564(b)(1) of the Act, 21  U.S.C. section 360bbb-3(b)(1), unless the authorization is  terminated or revoked.    Respiratory Syncytial Virus by PCR NEGATIVE NEGATIVE Final    Comment: (NOTE) Fact Sheet for Patients: PinkCheek.be  Fact Sheet for Healthcare Providers: GravelBags.it  This test is not yet approved or cleared by the [place] FDA and  has been authorized for detection and/or diagnosis of SARS-CoV-2 by  FDA under an Emergency Use Authorization (EUA). This EUA will remain  in  effect (meaning this test can be used) for the duration of the  COVID-19 declaration under Section 564(b)(1) of the Act, 21 U.S.C.  section 360bbb-3(b)(1), unless the authorization is terminated or  revoked. Performed at [facility], [place] [address], [place], [place] [address]   Urine culture     Status: Abnormal   Collection Time: 12/08/19  4:27 AM   Specimen: Urine, Random  Result Value Ref Range Status   Specimen Description URINE, RANDOM  Final   Special Requests NONE  Final   Culture (A)  Final    <10,000 COLONIES/mL INSIGNIFICANT GROWTH Performed at [facility], [place] [address] [place], [place] [address]    Report Status 12/09/2019 FINAL  Final  MRSA PCR Screening     Status: Abnormal   Collection Time: 12/08/19  7:55 PM   Specimen: Nasal Mucosa; Nasopharyngeal  Result Value Ref Range Status   MRSA by PCR POSITIVE (A) NEGATIVE Final    Comment:        The GeneXpert MRSA Assay (FDA approved for NASAL specimens only), is one component of a comprehensive MRSA colonization surveillance program. It is not intended to diagnose MRSA infection nor to guide or monitor treatment for MRSA infections. RESULT CALLED TO, READ BACK BY AND VERIFIED WITH: Alice Rieger RN 12/09/19 AT 0025 SK Performed at [facility], [place] [address] [place], [place] [address]   Gastrointestinal Panel by PCR , Stool     Status: None   Collection Time: 12/09/19  8:15 AM   Specimen: Stool  Result Value Ref Range Status   Campylobacter species NOT DETECTED NOT DETECTED Final   Plesimonas shigelloides NOT DETECTED NOT DETECTED Final   Salmonella species NOT DETECTED NOT DETECTED Final   Yersinia enterocolitica NOT DETECTED NOT DETECTED Final   Vibrio species NOT DETECTED NOT DETECTED Final   Vibrio cholerae NOT DETECTED NOT DETECTED Final  Enteroaggregative E coli (EAEC) NOT DETECTED NOT DETECTED Final   Enteropathogenic E coli (EPEC) NOT DETECTED NOT DETECTED Final   Enterotoxigenic E  coli (ETEC) NOT DETECTED NOT DETECTED Final   Shiga like toxin producing E coli (STEC) NOT DETECTED NOT DETECTED Final   Shigella/Enteroinvasive E coli (EIEC) NOT DETECTED NOT DETECTED Final   Cryptosporidium NOT DETECTED NOT DETECTED Final   Cyclospora cayetanensis NOT DETECTED NOT DETECTED Final   Entamoeba histolytica NOT DETECTED NOT DETECTED Final   Giardia lamblia NOT DETECTED NOT DETECTED Final   Adenovirus F40/41 NOT DETECTED NOT DETECTED Final   Astrovirus NOT DETECTED NOT DETECTED Final   Norovirus GI/GII NOT DETECTED NOT DETECTED Final   Rotavirus A NOT DETECTED NOT DETECTED Final   Sapovirus (I, II, IV, and V) NOT DETECTED NOT DETECTED Final    Comment: Performed at [facility], [address] [place], [place] [address]  C Difficile Quick Screen w PCR reflex     Status: None   Collection Time: 12/09/19  8:15 AM   Specimen: STOOL  Result Value Ref Range Status   C Diff antigen NEGATIVE NEGATIVE Final   C Diff toxin NEGATIVE NEGATIVE Final   C Diff interpretation No C. difficile detected.  Final    Comment: Performed at [facility], [place] [address] [place], [place] [address]     Labs: BNP (last 3 results) Recent Labs    12/08/19 0510 12/09/19 0021  BNP 755.4* 789.3*   Basic Metabolic Panel: Recent Labs  Lab 12/09/19 0021 12/10/19 0047 12/11/19 0041 12/12/19 0736 12/13/19 0032  NA 133* 136 137 137 137  K 3.9 3.8 4.0 4.2 3.6  CL 100 103 103 103 101  CO2 22 22 24 24 25   GLUCOSE 75 128* 143* 146* 177*  BUN 25* 24* 20 22 22   CREATININE 1.73* 1.53* 1.28* 1.37* 1.23*  CALCIUM 9.3 9.2 9.5 9.6 9.4   Liver Function Tests: Recent Labs  Lab 12/07/19 2344  AST 27  ALT 20  ALKPHOS 59  BILITOT 1.1  PROT 6.9  ALBUMIN 2.8*   No results for input(s): LIPASE, AMYLASE in the last 168 hours. No results for input(s): AMMONIA in the last 168 hours. CBC: Recent Labs  Lab 12/07/19 2344 12/08/19 0500 12/09/19 0021 12/10/19 0047 12/11/19 0041  12/12/19 0736 12/13/19 0032  WBC 15.4*   < > 10.4 8.0 8.8 9.2 11.3*  NEUTROABS 13.8*  --   --   --   --   --   --   HGB 11.8*   < > 11.4* 10.5* 11.3* 11.3* 11.3*  HCT 38.1   < > 37.1 34.5* 36.8 37.7 37.1  MCV 90.1   < > 90.7 89.4 90.6 90.4 90.0  PLT 194   < > 190 199 252 357 391   < > = values in this interval not displayed.   Cardiac Enzymes: No results for input(s): CKTOTAL, CKMB, CKMBINDEX, TROPONINI in the last 168 hours. BNP: Invalid input(s): POCBNP CBG: Recent Labs  Lab 12/12/19 1612 12/12/19 2148 12/13/19 0647 12/13/19 1101 12/13/19 1621  GLUCAP 133* 144* 146* 170* 124*   D-Dimer No results for input(s): DDIMER in the last 72 hours. Hgb A1c No results for input(s): HGBA1C in the last 72 hours. Lipid Profile No results for input(s): CHOL, HDL, LDLCALC, TRIG, CHOLHDL, LDLDIRECT in the last 72 hours. Thyroid function studies No results for input(s): TSH, T4TOTAL, T3FREE, THYROIDAB in the last 72 hours.  Invalid input(s): FREET3 Anemia work  up No results for input(s): VITAMINB12, FOLATE, FERRITIN, TIBC, IRON, RETICCTPCT in the last 72 hours. Urinalysis    Component Value Date/Time   COLORURINE AMBER (A) 12/08/2019 0423   APPEARANCEUR CLOUDY (A) 12/08/2019 0423   LABSPEC 1.014 12/08/2019 0423   PHURINE 5.0 12/08/2019 0423   GLUCOSEU NEGATIVE 12/08/2019 0423   HGBUR LARGE (A) 12/08/2019 0423   BILIRUBINUR NEGATIVE 12/08/2019 0423   KETONESUR NEGATIVE 12/08/2019 0423   PROTEINUR 100 (A) 12/08/2019 0423   NITRITE NEGATIVE 12/08/2019 0423   LEUKOCYTESUR MODERATE (A) 12/08/2019 0423   Sepsis Labs Invalid input(s): PROCALCITONIN,  WBC,  LACTICIDVEN Microbiology Recent Results (from the past 240 hour(s))  Culture, blood (Routine x 2)     Status: None   Collection Time: 12/07/19 11:15 PM   Specimen: BLOOD RIGHT FOREARM  Result Value Ref Range Status   Specimen Description BLOOD RIGHT FOREARM  Final   Special Requests   Final    BOTTLES DRAWN AEROBIC AND  ANAEROBIC Blood Culture adequate volume   Culture   Final    NO GROWTH 5 DAYS Performed at [facility], [address], [place], [place] [address]    Report Status 12/13/2019 FINAL  Final  Resp Panel by RT PCR (RSV, Flu A&B, Covid) - Nasopharyngeal Swab     Status: None   Collection Time: 12/08/19  3:45 AM   Specimen: Nasopharyngeal Swab  Result Value Ref Range Status   SARS Coronavirus 2 by RT PCR NEGATIVE NEGATIVE Final    Comment: (NOTE) SARS-CoV-2 target nucleic acids are NOT DETECTED.  The SARS-CoV-2 RNA is generally detectable in upper respiratoy specimens during the acute phase of infection. The lowest concentration of SARS-CoV-2 viral copies this assay can detect is 131 copies/mL. A negative result does not preclude SARS-Cov-2 infection and should not be used as the sole basis for treatment or other patient management decisions. A negative result may occur with  improper specimen collection/handling, submission of specimen other than nasopharyngeal swab, presence of viral mutation(s) within the areas targeted by this assay, and inadequate number of viral copies (<131 copies/mL). A negative result must be combined with clinical observations, patient history, and epidemiological information. The expected result is Negative.  Fact Sheet for Patients:  PinkCheek.be  Fact Sheet for Healthcare Providers:  GravelBags.it  This test is no t yet approved or cleared by the [place] FDA and  has been authorized for detection and/or diagnosis of SARS-CoV-2 by FDA under an Emergency Use Authorization (EUA). This EUA will remain  in effect (meaning this test can be used) for the duration of the COVID-19 declaration under Section 564(b)(1) of the Act, 21 U.S.C. section 360bbb-3(b)(1), unless the authorization is terminated or revoked sooner.     Influenza A by PCR NEGATIVE NEGATIVE Final   Influenza B by PCR  NEGATIVE NEGATIVE Final    Comment: (NOTE) The Xpert Xpress SARS-CoV-2/FLU/RSV assay is intended as an aid in  the diagnosis of influenza from Nasopharyngeal swab specimens and  should not be used as a sole basis for treatment. Nasal washings and  aspirates are unacceptable for Xpert Xpress SARS-CoV-2/FLU/RSV  testing.  Fact Sheet for Patients: PinkCheek.be  Fact Sheet for Healthcare Providers: GravelBags.it  This test is not yet approved or cleared by the [place] FDA and  has been authorized for detection and/or diagnosis of SARS-CoV-2 by  FDA under an Emergency Use Authorization (EUA). This EUA will remain  in effect (meaning this test can be used) for the duration  of the  Covid-19 declaration under Section 564(b)(1) of the Act, 21  U.S.C. section 360bbb-3(b)(1), unless the authorization is  terminated or revoked.    Respiratory Syncytial Virus by PCR NEGATIVE NEGATIVE Final    Comment: (NOTE) Fact Sheet for Patients: PinkCheek.be  Fact Sheet for Healthcare Providers: GravelBags.it  This test is not yet approved or cleared by the [place] FDA and  has been authorized for detection and/or diagnosis of SARS-CoV-2 by  FDA under an Emergency Use Authorization (EUA). This EUA will remain  in effect (meaning this test can be used) for the duration of the  COVID-19 declaration under Section 564(b)(1) of the Act, 21 U.S.C.  section 360bbb-3(b)(1), unless the authorization is terminated or  revoked. Performed at [facility], [place] [address] [place], [place] [address]   Urine culture     Status: Abnormal   Collection Time: 12/08/19  4:27 AM   Specimen: Urine, Random  Result Value Ref Range Status   Specimen Description URINE, RANDOM  Final   Special Requests NONE  Final   Culture (A)  Final    <10,000 COLONIES/mL INSIGNIFICANT GROWTH Performed at  [facility], [place] [address], [place], [place] [address]    Report Status 12/09/2019 FINAL  Final  MRSA PCR Screening     Status: Abnormal   Collection Time: 12/08/19  7:55 PM   Specimen: Nasal Mucosa; Nasopharyngeal  Result Value Ref Range Status   MRSA by PCR POSITIVE (A) NEGATIVE Final    Comment:        The GeneXpert MRSA Assay (FDA approved for NASAL specimens only), is one component of a comprehensive MRSA colonization surveillance program. It is not intended to diagnose MRSA infection nor to guide or monitor treatment for MRSA infections. RESULT CALLED TO, READ BACK BY AND VERIFIED WITH: Alice Rieger RN 12/09/19 AT 0025 SK Performed at [facility], [place] [address], [place], [place] [address]   Gastrointestinal Panel by PCR , Stool     Status: None   Collection Time: 12/09/19  8:15 AM   Specimen: Stool  Result Value Ref Range Status   Campylobacter species NOT DETECTED NOT DETECTED Final   Plesimonas shigelloides NOT DETECTED NOT DETECTED Final   Salmonella species NOT DETECTED NOT DETECTED Final   Yersinia enterocolitica NOT DETECTED NOT DETECTED Final   Vibrio species NOT DETECTED NOT DETECTED Final   Vibrio cholerae NOT DETECTED NOT DETECTED Final   Enteroaggregative E coli (EAEC) NOT DETECTED NOT DETECTED Final   Enteropathogenic E coli (EPEC) NOT DETECTED NOT DETECTED Final   Enterotoxigenic E coli (ETEC) NOT DETECTED NOT DETECTED Final   Shiga like toxin producing E coli (STEC) NOT DETECTED NOT DETECTED Final   Shigella/Enteroinvasive E coli (EIEC) NOT DETECTED NOT DETECTED Final   Cryptosporidium NOT DETECTED NOT DETECTED Final   Cyclospora cayetanensis NOT DETECTED NOT DETECTED Final   Entamoeba histolytica NOT DETECTED NOT DETECTED Final   Giardia lamblia NOT DETECTED NOT DETECTED Final   Adenovirus F40/41 NOT DETECTED NOT DETECTED Final   Astrovirus NOT DETECTED NOT DETECTED Final   Norovirus GI/GII NOT DETECTED NOT DETECTED Final   Rotavirus A NOT  DETECTED NOT DETECTED Final   Sapovirus (I, II, IV, and V) NOT DETECTED NOT DETECTED Final    Comment: Performed at [facility], [address] [place], [place] [address]  C Difficile Quick Screen w PCR reflex     Status: None   Collection Time: 12/09/19  8:15  AM   Specimen: STOOL  Result Value Ref Range Status   C Diff antigen NEGATIVE NEGATIVE Final   C Diff toxin NEGATIVE NEGATIVE Final   C Diff interpretation No C. difficile detected.  Final    Comment: Performed at [facility], [place] [address], [place], [place] [address]     Time coordinating discharge: Over 30 minutes  SIGNED:   Edmonia Lynch, MD  [facility] 12/13/2019, 8:03 PM Pager   If 7PM-7AM, please contact night-coverage [email or web address] Password [email or web address]

## 2019-12-13 NOTE — TOC Transition Note (Signed)
Transition of Care [facility]) - CM/SW Discharge Note   Patient Details  Name: Miranda Wilkinson MRN: [id number] Date of Birth: 1935/10/25  Transition of [facility]) CM/SW Contact:  Carles Collet, RN Phone Number: 12/13/2019, 1:38 PM   Clinical Narrative:    Damaris Schooner w daugher [name]. She confirms patient will DC to her house at  [place] [address]. Patient has WC , RW, 3/1 at home.  Referral made to [facility] for [facility] services and accepted.  No other TOC needs identified.      Barriers to Discharge: Continued Medical Work up   Patient Goals and CMS Choice Patient states their goals for this hospitalization and ongoing recovery are:: to go home CMS Medicare.gov Compare Post Acute Care list provided to:: Patient Choice offered to / list presented to : Patient  Discharge Placement                       Discharge Plan and Services   Discharge Planning Services: CM Consult Post Acute Care Choice: Home Health                    HH Arranged: PT [place]: [facility] Date [place]: 12/13/19 Time [place]: [id number] Representative spoke with at [place]: [place] ([place]) Interventions     Readmission Risk Interventions No flowsheet data found.

## 2019-12-13 NOTE — Progress Notes (Signed)
Progress Note  Patient Name: Miranda Wilkinson Date of Encounter: 12/13/2019  Primary Cardiologist: Carlyle Dolly, MD  Subjective   No issues overnight- remains in rate-controlled afib. Sleeping today.  Inpatient Medications    Scheduled Meds: . apixaban  5 mg Oral BID  . bisoprolol  5 mg Oral Daily  . Chlorhexidine Gluconate Cloth  6 each Topical Q0600  . cholecalciferol  2,000 Units Oral Daily  . diltiazem  240 mg Oral Daily  . insulin aspart  0-5 Units Subcutaneous QHS  . insulin aspart  0-9 Units Subcutaneous TID WC  . iron polysaccharides  150 mg Oral Daily  . levothyroxine  112 mcg Oral Q0600  . loratadine  10 mg Oral Daily  . magnesium oxide  200 mg Oral QODAY  . multivitamin with minerals  1 tablet Oral Daily  . mupirocin ointment  1 application Nasal BID  . simvastatin  20 mg Oral Q supper   Continuous Infusions: . cefTRIAXone (ROCEPHIN)  IV 2 g (12/12/19 0951)   PRN Meds: acetaminophen **OR** acetaminophen, albuterol, docusate sodium, melatonin, ondansetron (ZOFRAN) IV, pantoprazole   Vital Signs    Vitals:   12/12/19 2351 12/13/19 0300 12/13/19 0348 12/13/19 0713  BP: 133/62  (!) 147/74 (!) 141/85  Pulse: 79 73 69 86  Resp: 16 19 20 17   Temp: 97.9 F (36.6 C)  97.9 F (36.6 C) 97.8 F (36.6 C)  TempSrc: Oral  Oral Oral  SpO2: 95% 94% 99% 97%  Weight:      Height:        Intake/Output Summary (Last 24 hours) at 12/13/2019 1015 Last data filed at 12/13/2019 0718 Gross per 24 hour  Intake 480 ml  Output 2600 ml  Net -2120 ml   Filed Weights   12/07/19 2249 12/08/19 1940  Weight: 99.8 kg 105.4 kg    Telemetry    Atrial fibrillation rate-controlled -  Personally reviewed.  ECG   N/A  Physical Exam   GEN:  Elderly woman.  No acute distress.   Neck: No JVD. Cardiac:  Irregularly irregular, no gallop, rate-controlled.  Respiratory:  Expiratory wheeze. GI: Soft, nontender, bowel sounds present. MS: Ankle edema; No deformity.  Labs     Chemistry Recent Labs  Lab 12/07/19 2344 12/08/19 0500 12/11/19 0041 12/12/19 0736 12/13/19 0032  NA 127*   < > 137 137 137  K 3.5   < > 4.0 4.2 3.6  CL 95*   < > 103 103 101  CO2 19*   < > 24 24 25   GLUCOSE 246*   < > 143* 146* 177*  BUN 25*   < > 20 22 22   CREATININE 1.89*   < > 1.28* 1.37* 1.23*  CALCIUM 9.0   < > 9.5 9.6 9.4  PROT 6.9  --   --   --   --   ALBUMIN 2.8*  --   --   --   --   AST 27  --   --   --   --   ALT 20  --   --   --   --   ALKPHOS 59  --   --   --   --   BILITOT 1.1  --   --   --   --   GFRNONAA 24*   < > 39* 36* 41*  GFRAA 28*   < > 45* 42* 47*  ANIONGAP 13   < > 10 10 11    < > =  values in this interval not displayed.     Hematology Recent Labs  Lab 12/11/19 0041 12/12/19 0736 12/13/19 0032  WBC 8.8 9.2 11.3*  RBC 4.06 4.17 4.12  HGB 11.3* 11.3* 11.3*  HCT 36.8 37.7 37.1  MCV 90.6 90.4 90.0  MCH 27.8 27.1 27.4  MCHC 30.7 30.0 30.5  RDW Not Measured 26.1* 25.9*  PLT 252 357 391    BNP Recent Labs  Lab 12/08/19 0510 12/09/19 0021  BNP 755.4* 714.3*     Radiology    No results found.  Patient Profile     84 y.o. female with a history of paroxysmal to persistent atrial fibrillation/flutter, hypertension, type 2 diabetes mellitus, hypothyroidism, OSA, and GERD.  Assessment & Plan    1.  Persistent atrial fibrillation with recent RVR in the setting of comorbid illnesses including COPD with acute hypoxic respiratory failure and UTI.  She has a history of paroxysmal to persistent atrial fibrillation/flutter, most recently on Tikosyn and Eliquis.  Chart reviewed with recommendations from Dr. Audie Box.  Tikosyn discontinued due to fluctuating renal dysfunction with plan for heart rate control at this time.  IV diltiazem discontinued with transition to oral diltiazem 240 mg daily.  Follow-up echocardiogram shows normal LVEF at 60 to 65%.  2.  Acute hypoxic respiratory failure with COPD - improving, slight wheeze today.  3.  UTI with  associated acute kidney injury.  Creatinine has come down to 1.23 today.  4.  Essential hypertension, home bisoprolol restarted, but losartan has been held.  Systolics 697X to 480X. May consider resumption when renal function reaches baseline if hypertension persists.  [facility] will sign off.   Medication Recommendations:  Continue current meds Other recommendations (labs, testing, etc):  none Follow up as an outpatient:  Dr. Sherwood Gambler, MD, [facility], [place] Director of the Advanced Lipid Disorders &  Cardiovascular Risk Reduction Clinic Diplomate of the American Board of Clinical Lipidology Attending Cardiologist  Direct Dial: [phone]  Fax: [phone]  Website:  [email or web address]  Pixie Casino, MD  12/13/2019, 10:15 AM

## 2019-12-13 NOTE — Plan of Care (Signed)

## 2019-12-13 NOTE — Progress Notes (Signed)
Pt discharge home with daughter. PIV d/c. Discharge info given to pt and daughter. All questions answered to pt's satisfaction.

## 2019-12-14 ENCOUNTER — Telehealth: Payer: Self-pay | Admitting: Cardiology

## 2019-12-14 NOTE — Telephone Encounter (Signed)
Pt has appt with Dr. Harl Bowie 12/20/19. Will forward call to Dr. Harl Bowie office.

## 2019-12-14 NOTE — Telephone Encounter (Signed)
I am looking ahead to my [facility] schedule through November and it is very busy.  Currently there is not a double slot available to do an EGD and colonoscopy.  Given the recent delay due to hospitalization, I would prefer that we do not wait into December if possible.  As it stands now, I do have time in my [facility] block on Monday, November 1st.    Please contact her daughter and give my recommendation that we put her in that date to secure the slot.  I still want them to come to the office visit before that as scheduled.

## 2019-12-14 NOTE — Telephone Encounter (Signed)
Pt daughter made aware that will keep both appts as scheduled

## 2019-12-14 NOTE — Telephone Encounter (Signed)
Patient's daughter wants to know if her mother needs to come in and see Dr. Quentin Ore before December since she had a recent hospitalization. There were no notes made in her discharge papers that I see. Please advise.

## 2019-12-14 NOTE — Telephone Encounter (Signed)
Patient's daughter returned call to [facility]

## 2019-12-14 NOTE — Telephone Encounter (Signed)
Pt d/c summary says f/u with Dr Harl Bowie (primary cardiologist) in 1 week and LOV with Dr Quentin Ore indicates f/u in 3 months - not sure why this message was sent - LM for daughter to return call and will make aware to keep both appts as scheduled

## 2019-12-15 ENCOUNTER — Other Ambulatory Visit: Payer: Self-pay

## 2019-12-15 ENCOUNTER — Ambulatory Visit: Payer: Medicare Other | Admitting: Cardiology

## 2019-12-15 DIAGNOSIS — D509 Iron deficiency anemia, unspecified: Secondary | ICD-10-CM

## 2019-12-15 DIAGNOSIS — Z86010 Personal history of colonic polyps: Secondary | ICD-10-CM

## 2019-12-15 MED ORDER — PLENVU 140 G PO SOLR: 1.0000 | ORAL | 0 refills | Status: DC

## 2019-12-15 NOTE — Telephone Encounter (Signed)
Spoke with patient's daughter Miranda Wilkinson) yesterday, she states that 01/09/20 will work for patient. She is aware that patient still needs to come in for her follow up on 01/04/20.   Patient has been scheduled for a COVID test on 01/05/20 at 11:30 AM.  EGD/Colon at [facility] on 01/09/20 at 9:45 am, arrival time 8:15 am. RX for Plenvu prep sent to pharmacy.   Will mail instructions.

## 2019-12-16 DIAGNOSIS — A419 Sepsis, unspecified organism: Secondary | ICD-10-CM | POA: Diagnosis not present

## 2019-12-16 DIAGNOSIS — F419 Anxiety disorder, unspecified: Secondary | ICD-10-CM | POA: Diagnosis not present

## 2019-12-16 DIAGNOSIS — D509 Iron deficiency anemia, unspecified: Secondary | ICD-10-CM | POA: Diagnosis not present

## 2019-12-16 DIAGNOSIS — R32 Unspecified urinary incontinence: Secondary | ICD-10-CM | POA: Diagnosis not present

## 2019-12-16 DIAGNOSIS — G4733 Obstructive sleep apnea (adult) (pediatric): Secondary | ICD-10-CM | POA: Diagnosis not present

## 2019-12-16 DIAGNOSIS — M479 Spondylosis, unspecified: Secondary | ICD-10-CM | POA: Diagnosis not present

## 2019-12-16 DIAGNOSIS — K219 Gastro-esophageal reflux disease without esophagitis: Secondary | ICD-10-CM | POA: Diagnosis not present

## 2019-12-16 DIAGNOSIS — I4819 Other persistent atrial fibrillation: Secondary | ICD-10-CM | POA: Diagnosis not present

## 2019-12-16 DIAGNOSIS — Z85850 Personal history of malignant neoplasm of thyroid: Secondary | ICD-10-CM | POA: Diagnosis not present

## 2019-12-16 DIAGNOSIS — I129 Hypertensive chronic kidney disease with stage 1 through stage 4 chronic kidney disease, or unspecified chronic kidney disease: Secondary | ICD-10-CM | POA: Diagnosis not present

## 2019-12-16 DIAGNOSIS — Z9181 History of falling: Secondary | ICD-10-CM | POA: Diagnosis not present

## 2019-12-16 DIAGNOSIS — E785 Hyperlipidemia, unspecified: Secondary | ICD-10-CM | POA: Diagnosis not present

## 2019-12-16 DIAGNOSIS — G43909 Migraine, unspecified, not intractable, without status migrainosus: Secondary | ICD-10-CM | POA: Diagnosis not present

## 2019-12-16 DIAGNOSIS — E039 Hypothyroidism, unspecified: Secondary | ICD-10-CM | POA: Diagnosis not present

## 2019-12-16 DIAGNOSIS — Z7901 Long term (current) use of anticoagulants: Secondary | ICD-10-CM | POA: Diagnosis not present

## 2019-12-16 DIAGNOSIS — E1122 Type 2 diabetes mellitus with diabetic chronic kidney disease: Secondary | ICD-10-CM | POA: Diagnosis not present

## 2019-12-16 DIAGNOSIS — Z87440 Personal history of urinary (tract) infections: Secondary | ICD-10-CM | POA: Diagnosis not present

## 2019-12-16 DIAGNOSIS — N183 Chronic kidney disease, stage 3 unspecified: Secondary | ICD-10-CM | POA: Diagnosis not present

## 2019-12-19 DIAGNOSIS — Z6836 Body mass index (BMI) 36.0-36.9, adult: Secondary | ICD-10-CM | POA: Diagnosis not present

## 2019-12-19 DIAGNOSIS — A419 Sepsis, unspecified organism: Secondary | ICD-10-CM | POA: Diagnosis not present

## 2019-12-20 ENCOUNTER — Encounter: Payer: Medicare Other | Admitting: Gastroenterology

## 2019-12-20 ENCOUNTER — Encounter: Payer: Self-pay | Admitting: Cardiology

## 2019-12-20 ENCOUNTER — Ambulatory Visit (INDEPENDENT_AMBULATORY_CARE_PROVIDER_SITE_OTHER): Payer: Medicare Other | Admitting: Cardiology

## 2019-12-20 VITALS — BP 120/60 | HR 56 | Ht 65.0 in | Wt 220.0 lb

## 2019-12-20 DIAGNOSIS — I4819 Other persistent atrial fibrillation: Secondary | ICD-10-CM

## 2019-12-20 NOTE — Progress Notes (Signed)
Clinical Summary Miranda Wilkinson is a 85 y.o.female seen today for follow up of the following medical problems.  1. PAF - afib noted by 01/26/2019 preop ekg - no recent palpitations. Has intermittent SOB/DOE at times, some days worst then other. - recent monitor showed rate controlled afib/aflutter  - issues with afib with RVR during 10/2019 clinic visit, referred to [place] clinic - admitted 10/24/19 for tikosyn initiation. Loaded with attempted cardioversion 10/27/19 but failed - at EP f/u 8/26 plans were for repeat cardioversion, if fails would consider amiodarone (minimal COPD according to pulm note). Appears admitted with anemia prior to cardioversion, EKG during that admission 11/04/19 showed NSR - dilt stopped 10/27/19 after tikosyn loading, remained on bisoprolol   - admission 12/2019 with COPD exacerbation and UTI, issues with afib with RVR.  - tikosyn was stopped due to labile renal function, Cr up to 1.9 on admission - no palpitations since discharge.      2. Anemia - admitted 10/2019 with symptomatic anemia. Hgb down to 6.5, given 1 unit prbcs   Past Medical History:  Diagnosis Date  . Allergy    seasonal allergies  . Anemia    IDA- on iron supplement  . Anxiety   . Atrial fibrillation ([place])   . Blood transfusion without reported diagnosis    2021- due to IDA/ low hemo (7.3)  . Cancer ([place]) 1976   bilaterl thyroid ca  . Diabetes mellitus without complication ([place])    on meds  . GERD (gastroesophageal reflux disease)    on meds---OTC  . Headache(784.0)    hx migraines  . Hyperlipidemia    on meds  . Hypertension    on meds  . Hypothyroidism    thyroidectomy in 1976- on meds at  this time  . Shortness of breath   . Sinus congestion   . Sleep apnea    study done in [place], [place], use CPAP----with home O2 concentrater at night with CPAP     No Known Allergies   Current Outpatient Medications  Medication Sig Dispense Refill  . acetaminophen (TYLENOL)  500 MG tablet Take 500-1,000 mg by mouth every 6 (six) hours as needed (for pain.).    Marland Kitchen B Complex Vitamins (B COMPLEX 50) TABS Take 1 tablet by mouth daily.     . bisoprolol (ZEBETA) 10 MG tablet Take 1 tablet (10 mg total) by mouth daily. 30 tablet 6  . cetirizine (ZYRTEC) 10 MG tablet Take 10 mg by mouth daily as needed for allergies.     . Cholecalciferol (VITAMIN D3) 50 MCG (2000 UT) capsule Take 2,000 Units by mouth daily.    Marland Kitchen diltiazem (CARDIZEM CD) 240 MG 24 hr capsule Take 1 capsule (240 mg total) by mouth daily. 30 capsule 0  . ELIQUIS 5 MG TABS tablet TAKE 1 TABLET TWICE A DAY (Patient taking differently: Take 5 mg by mouth 2 (two) times daily. (0800 & 2000)) 180 tablet 3  . glipiZIDE (GLUCOTROL) 10 MG tablet Take 10 mg by mouth daily.     . iron polysaccharides (NU-IRON) 150 MG capsule Take 1 capsule (150 mg total) by mouth daily. 30 capsule 0  . levothyroxine (SYNTHROID) 112 MCG tablet Take 112 mcg by mouth daily.     Marland Kitchen losartan (COZAAR) 100 MG tablet Take 1 tablet (100 mg total) by mouth daily. 90 tablet 3  . Magnesium 250 MG TABS Take 250 mg by mouth every other day. In the morning.    . melatonin  5 MG TABS Take 2.5-5 mg by mouth at bedtime as needed (sleep).     . metFORMIN (GLUCOPHAGE-XR) 500 MG 24 hr tablet Take 1,000 mg by mouth daily with supper.    . Multiple Vitamins-Minerals (MULTIVITAMIN WITH MINERALS) tablet Take 1 tablet by mouth daily.    . Omeprazole 20 MG TBDD Take 20 mg by mouth as needed (acid reflux). OTC    . PEG-KCl-NaCl-NaSulf-Na Asc-C (PLENVU) 140 g SOLR Take 1 kit by mouth as directed. 1 each 0  . POTASSIUM GLUCONATE PO Take 595 mg by mouth every other day.    . simvastatin (ZOCOR) 40 MG tablet Take 20 mg by mouth daily with supper.     . umeclidinium-vilanterol (ANORO ELLIPTA) 62.5-25 MCG/INH AEPB Only open the device one time and then take your two separate drags to be sure you get it all (Patient not taking: Reported on 12/08/2019) 1 each 11   No current  facility-administered medications for this visit.     Past Surgical History:  Procedure Laterality Date  . APPENDECTOMY    . CARDIOVERSION N/A 10/27/2019   Procedure: CARDIOVERSION;  Surgeon: [name], MD;  Location: [facility] ENDOSCOPY;  Service: Cardiovascular;  Laterality: N/A;  . CHOLECYSTECTOMY    . OPEN REDUCTION INTERNAL FIXATION (ORIF) TIBIA/FIBULA FRACTURE Right 11/03/2013   Procedure: OPEN REDUCTION INTERNAL FIXATION (ORIF) RIGHT TIBIA/FIBULA PILON FRACTURE;  Surgeon: Wylene Simmer, MD;  Location: [facility];  Service: Orthopedics;  Laterality: Right;  . OVARIAN CYST SURGERY Left    age 56  . THYROIDECTOMY Bilateral 1976  . TUBAL LIGATION       No Known Allergies    Family History  Problem Relation Age of Onset  . Stroke Mother        brain hemmorhage  . Diabetes Mother   . Heart disease Mother   . Thyroid disease Mother   . Arrhythmia Mother   . Stroke Father   . Heart attack Father        blood clot in brain  . Stroke Sister   . Cancer Sister   . Stomach cancer Brother 32  . Hypertension Brother   . Esophageal cancer Brother 53  . Stroke Brother   . Hypertension Brother   . Arrhythmia Sister   . Hypertension Sister   . Colon polyps Neg Hx   . Colon cancer Neg Hx   . Rectal cancer Neg Hx      Social History Miranda Wilkinson reports that she quit smoking about 8 years ago. Her smoking use included cigarettes. She has a 90.00 pack-year smoking history. She has never used smokeless tobacco. Miranda Wilkinson reports no history of alcohol use.   Review of Systems CONSTITUTIONAL: No weight loss, fever, chills, weakness or fatigue.  HEENT: Eyes: No visual loss, blurred vision, double vision or yellow sclerae.No hearing loss, sneezing, congestion, runny nose or sore throat.  SKIN: No rash or itching.  CARDIOVASCULAR: per hpi RESPIRATORY: No shortness of breath, cough or sputum.  GASTROINTESTINAL: No anorexia, nausea, vomiting or diarrhea. No abdominal pain or blood.   GENITOURINARY: No burning on urination, no polyuria NEUROLOGICAL: No headache, dizziness, syncope, paralysis, ataxia, numbness or tingling in the extremities. No change in bowel or bladder control.  MUSCULOSKELETAL: No muscle, back pain, joint pain or stiffness.  LYMPHATICS: No enlarged nodes. No history of splenectomy.  PSYCHIATRIC: No history of depression or anxiety.  ENDOCRINOLOGIC: No reports of sweating, cold or heat intolerance. No polyuria or polydipsia.  Marland Kitchen   Physical  Examination Today's Vitals   12/20/19 1005  BP: 120/60  Pulse: (!) 56  SpO2: 98%  Weight: 220 lb (99.8 kg)  Height: '5\' 5"'  (1.651 m)   Body mass index is 36.61 kg/m.  Gen: resting comfortably, no acute distress HEENT: no scleral icterus, pupils equal round and reactive, no palptable cervical adenopathy,  CV: RRR, no m/r/g, no jvd Resp: Clear to auscultation bilaterally GI: abdomen is soft, non-tender, non-distended, normal bowel sounds, no hepatosplenomegaly MSK: extremities are warm, no edema.  Skin: warm, no rash Neuro:  no focal deficits Psych: appropriate affect   Diagnostic Studies 02/2019 echo IMPRESSIONS    1. Left ventricular ejection fraction, by visual estimation, is 70 to  75%. The left ventricle has hyperdynamic function. There is no left  ventricular hypertrophy.  2. Left ventricular diastolic parameters are indeterminate.  3. The left ventricle has no regional wall motion abnormalities.  4. Global right ventricle has normal systolic function.The right  ventricular size is normal. No increase in right ventricular wall  thickness.  5. Left atrial size was normal.  6. Right atrial size was normal.  7. The mitral valve is normal in structure. Trivial mitral valve  regurgitation. No evidence of mitral stenosis.  8. The tricuspid valve is normal in structure. Tricuspid valve  regurgitation is not demonstrated.  9. The aortic valve has an indeterminant number of cusps. Aortic  valve  regurgitation is not visualized. Mild aortic valve sclerosis without  stenosis.  10. The pulmonic valve was not well visualized. Pulmonic valve  regurgitation is not visualized.  11. Normal pulmonary artery systolic pressure.     Assessment and Plan  1. PAF - off tikosyn after recent admission with AKI - back on bisoprolol and diltiazem, EKG today shows SR. No significant symtpoms, continue current therapy. If recurrent issues with afib could consider amidoarone.   2. Preoperative evaluation - ok to proceed with endoscopy, would hold eliquis starting 2 days prior to procedure and resume the day after.  - clearance for GI procedures has not changed from prior evaluation, ok to proceed from cardiac standpoint.       Arnoldo Lenis, M.D.

## 2019-12-20 NOTE — Patient Instructions (Signed)

## 2019-12-21 DIAGNOSIS — E1122 Type 2 diabetes mellitus with diabetic chronic kidney disease: Secondary | ICD-10-CM | POA: Diagnosis not present

## 2019-12-21 DIAGNOSIS — N183 Chronic kidney disease, stage 3 unspecified: Secondary | ICD-10-CM | POA: Diagnosis not present

## 2019-12-22 DIAGNOSIS — N183 Chronic kidney disease, stage 3 unspecified: Secondary | ICD-10-CM | POA: Diagnosis not present

## 2019-12-22 DIAGNOSIS — E1122 Type 2 diabetes mellitus with diabetic chronic kidney disease: Secondary | ICD-10-CM | POA: Diagnosis not present

## 2019-12-22 DIAGNOSIS — D509 Iron deficiency anemia, unspecified: Secondary | ICD-10-CM | POA: Diagnosis not present

## 2019-12-22 DIAGNOSIS — I129 Hypertensive chronic kidney disease with stage 1 through stage 4 chronic kidney disease, or unspecified chronic kidney disease: Secondary | ICD-10-CM | POA: Diagnosis not present

## 2019-12-22 DIAGNOSIS — K219 Gastro-esophageal reflux disease without esophagitis: Secondary | ICD-10-CM | POA: Diagnosis not present

## 2019-12-22 DIAGNOSIS — I4819 Other persistent atrial fibrillation: Secondary | ICD-10-CM | POA: Diagnosis not present

## 2019-12-26 DIAGNOSIS — E1122 Type 2 diabetes mellitus with diabetic chronic kidney disease: Secondary | ICD-10-CM | POA: Diagnosis not present

## 2019-12-26 DIAGNOSIS — E875 Hyperkalemia: Secondary | ICD-10-CM | POA: Diagnosis not present

## 2019-12-28 DIAGNOSIS — D509 Iron deficiency anemia, unspecified: Secondary | ICD-10-CM | POA: Diagnosis not present

## 2019-12-28 DIAGNOSIS — N183 Chronic kidney disease, stage 3 unspecified: Secondary | ICD-10-CM | POA: Diagnosis not present

## 2019-12-28 DIAGNOSIS — I4819 Other persistent atrial fibrillation: Secondary | ICD-10-CM | POA: Diagnosis not present

## 2019-12-28 DIAGNOSIS — I129 Hypertensive chronic kidney disease with stage 1 through stage 4 chronic kidney disease, or unspecified chronic kidney disease: Secondary | ICD-10-CM | POA: Diagnosis not present

## 2019-12-28 DIAGNOSIS — K219 Gastro-esophageal reflux disease without esophagitis: Secondary | ICD-10-CM | POA: Diagnosis not present

## 2019-12-28 DIAGNOSIS — E1122 Type 2 diabetes mellitus with diabetic chronic kidney disease: Secondary | ICD-10-CM | POA: Diagnosis not present

## 2019-12-29 ENCOUNTER — Ambulatory Visit: Payer: Medicare Other | Admitting: Family Medicine

## 2019-12-29 ENCOUNTER — Encounter (HOSPITAL_COMMUNITY): Payer: Self-pay | Admitting: Gastroenterology

## 2019-12-29 ENCOUNTER — Other Ambulatory Visit: Payer: Self-pay

## 2019-12-29 DIAGNOSIS — E1122 Type 2 diabetes mellitus with diabetic chronic kidney disease: Secondary | ICD-10-CM | POA: Diagnosis not present

## 2019-12-29 DIAGNOSIS — I4819 Other persistent atrial fibrillation: Secondary | ICD-10-CM | POA: Diagnosis not present

## 2019-12-29 DIAGNOSIS — K219 Gastro-esophageal reflux disease without esophagitis: Secondary | ICD-10-CM | POA: Diagnosis not present

## 2019-12-29 DIAGNOSIS — I129 Hypertensive chronic kidney disease with stage 1 through stage 4 chronic kidney disease, or unspecified chronic kidney disease: Secondary | ICD-10-CM | POA: Diagnosis not present

## 2019-12-29 DIAGNOSIS — N183 Chronic kidney disease, stage 3 unspecified: Secondary | ICD-10-CM | POA: Diagnosis not present

## 2019-12-29 DIAGNOSIS — D509 Iron deficiency anemia, unspecified: Secondary | ICD-10-CM | POA: Diagnosis not present

## 2020-01-02 DIAGNOSIS — K219 Gastro-esophageal reflux disease without esophagitis: Secondary | ICD-10-CM | POA: Diagnosis not present

## 2020-01-02 DIAGNOSIS — D509 Iron deficiency anemia, unspecified: Secondary | ICD-10-CM | POA: Diagnosis not present

## 2020-01-02 DIAGNOSIS — I129 Hypertensive chronic kidney disease with stage 1 through stage 4 chronic kidney disease, or unspecified chronic kidney disease: Secondary | ICD-10-CM | POA: Diagnosis not present

## 2020-01-02 DIAGNOSIS — E1122 Type 2 diabetes mellitus with diabetic chronic kidney disease: Secondary | ICD-10-CM | POA: Diagnosis not present

## 2020-01-02 DIAGNOSIS — N183 Chronic kidney disease, stage 3 unspecified: Secondary | ICD-10-CM | POA: Diagnosis not present

## 2020-01-02 DIAGNOSIS — I4819 Other persistent atrial fibrillation: Secondary | ICD-10-CM | POA: Diagnosis not present

## 2020-01-03 DIAGNOSIS — R300 Dysuria: Secondary | ICD-10-CM | POA: Diagnosis not present

## 2020-01-03 DIAGNOSIS — N183 Chronic kidney disease, stage 3 unspecified: Secondary | ICD-10-CM | POA: Diagnosis not present

## 2020-01-04 ENCOUNTER — Encounter: Payer: Self-pay | Admitting: Gastroenterology

## 2020-01-04 ENCOUNTER — Ambulatory Visit (INDEPENDENT_AMBULATORY_CARE_PROVIDER_SITE_OTHER): Payer: Medicare Other | Admitting: Gastroenterology

## 2020-01-04 VITALS — BP 124/72 | HR 68 | Ht 65.0 in | Wt 221.0 lb

## 2020-01-04 DIAGNOSIS — D508 Other iron deficiency anemias: Secondary | ICD-10-CM

## 2020-01-04 NOTE — Progress Notes (Signed)
[facility] Progress Note  Chief Complaint: Iron deficiency anemia  Subjective  History: Seen September 7 for iron deficiency anemia.  Plans were for EGD and colonoscopy, and we needed cardiology evaluation beforehand to assess cardiac status and answer question of brief hold of anticoagulation.  After syncope and occasion with cardiology, they saw her on September 21 regarding the atrial fibrillation.  Patient was then hospitalized September 29 for rapid A. fib with dyspnea triggered by urosepsis.  Patient had been scheduled for her EGD and colonoscopy with me shortly afterwards, but I felt that it should be delayed to allow recovery from that acute illness and then clinic reevaluation.  She is tentatively scheduled for the procedures next Monday in the [facility] outpatient endoscopy lab.  (Cardiology had previously given the okay to hold [facility] 2 days prior to procedure) The IDA was originally discovered in late August when patient was evaluated by A. fib clinic, hemoglobin was 7.3, she was then contacted and sent to hospital for evaluation.  Hemoglobin there was 6.5, FOBT negative.  1 unit PRBCs and IV iron were given.  No overt bleeding was witnessed.   She has since been on oral iron. Miranda Wilkinson is quite hard of hearing, so her daughter Miranda Wilkinson accompanying her helps with the history.  There is been no overt bleeding, there is intermittent dark stool on iron tablets.  Occasional nausea, no vomiting.  Occasional vague burning abdominal discomfort, no constipation or diarrhea.  ROS: Cardiovascular:  no chest pain Respiratory: no dyspnea Generalized fatigue Dyspnea with exertion Remainder systems negative except as above The patient's Past Medical, Family and Social History were reviewed and are on file in the EMR. Recent discharge summary from hospitalization reviewed  Objective:  Med list reviewed  Current Outpatient Medications:  .  acetaminophen (TYLENOL) 500 MG tablet, Take  500-1,000 mg by mouth every 6 (six) hours as needed (for pain.)., Disp: , Rfl:  .  bisoprolol (ZEBETA) 10 MG tablet, Take 1 tablet (10 mg total) by mouth daily., Disp: 30 tablet, Rfl: 6 .  cetirizine (ZYRTEC) 10 MG tablet, Take 10 mg by mouth daily as needed for allergies. , Disp: , Rfl:  .  Cholecalciferol (VITAMIN D3) 50 MCG (2000 UT) capsule, Take 2,000 Units by mouth daily., Disp: , Rfl:  .  diltiazem (CARDIZEM CD) 240 MG 24 hr capsule, Take 1 capsule (240 mg total) by mouth daily., Disp: 30 capsule, Rfl: 0 .  ELIQUIS 5 MG TABS tablet, TAKE 1 TABLET TWICE A DAY (Patient taking differently: Take 5 mg by mouth 2 (two) times daily. (0800 & 2000)), Disp: 180 tablet, Rfl: 3 .  glipiZIDE (GLUCOTROL) 10 MG tablet, Take 10 mg by mouth daily. , Disp: , Rfl:  .  GUAIFENESIN 1200 PO, Take 1,200 mg by mouth 2 (two) times daily as needed (sinus congestion.)., Disp: , Rfl:  .  iron polysaccharides (NU-IRON) 150 MG capsule, Take 1 capsule (150 mg total) by mouth daily. (Patient taking differently: Take 150 mg by mouth every evening. ), Disp: 30 capsule, Rfl: 0 .  levothyroxine (SYNTHROID) 112 MCG tablet, Take 112 mcg by mouth daily before breakfast. , Disp: , Rfl:  .  melatonin 5 MG TABS, Take 2.5-5 mg by mouth at bedtime as needed (sleep). , Disp: , Rfl:  .  metFORMIN (GLUCOPHAGE-XR) 500 MG 24 hr tablet, Take 1,000 mg by mouth daily with supper., Disp: , Rfl:  .  Multiple Vitamins-Minerals (MULTIVITAMIN WITH MINERALS) tablet, Take 1 tablet by  mouth daily., Disp: , Rfl:  .  omeprazole (PRILOSEC) 20 MG capsule, Take 20 mg by mouth daily as needed (acid reflux/indigestion.)., Disp: , Rfl:  .  PEG-KCl-NaCl-NaSulf-Na Asc-C (PLENVU) 140 g SOLR, Take 1 kit by mouth as directed., Disp: 1 each, Rfl: 0 .  simvastatin (ZOCOR) 40 MG tablet, Take 20 mg by mouth daily with supper. , Disp: , Rfl:  .  triamcinolone cream (KENALOG) 0.1 %, Apply 1 application topically 2 (two) times daily as needed (skin irritation.). , Disp: ,  Rfl:    Vital signs in last 24 hrs: Vitals:   01/04/20 1554  BP: 124/72  Pulse: 68    Physical Exam  Antalgic gait, walks slowly, gets on exam table with assistance. hard of hearing  HEENT: sclera anicteric, oral mucosa moist without lesions  Neck: supple, no thyromegaly, JVD or lymphadenopathy  Cardiac: RRR without murmurs, S1S2 heard, mild peripheral edema  Pulm: clear to auscultation bilaterally, normal RR and effort noted  Abdomen: soft, no tenderness, with active bowel sounds. No guarding or palpable hepatosplenomegaly.  Obese  Skin; warm and dry, no jaundice or rash  Labs:  CBC Latest Ref Rng & Units 12/13/2019 12/12/2019 12/11/2019  WBC 4.0 - 10.5 K/uL 11.3(H) 9.2 8.8  Hemoglobin 12.0 - 15.0 g/dL 11.3(L) 11.3(L) 11.3(L)  Hematocrit 36 - 46 % 37.1 37.7 36.8  Platelets 150 - 400 K/uL 391 357 252   CMP Latest Ref Rng & Units 12/13/2019 12/12/2019 12/11/2019  Glucose 70 - 99 mg/dL 177(H) 146(H) 143(H)  BUN 8 - 23 mg/dL _0 Creatinine 0.44 - 1.00 mg/dL 1.23(H) 1.37(H) 1.28(H)  Sodium 135 - 145 mmol/L 137 137 137  Potassium 3.5 - 5.1 mmol/L 3.6 4.2 4.0  Chloride 98 - 111 mmol/L 101 103 103  CO2 22 - 32 mmol/L _1 Calcium 8.9 - 10.3 mg/dL 9.4 9.6 9.5  Total Protein 6.5 - 8.1 g/dL - - -  Total Bilirubin 0.3 - 1.2 mg/dL - - -  Alkaline Phos 38 - 126 U/L - - -  AST 15 - 41 U/L - - -  ALT 0 - 44 U/L - - -    ___________________________________________ Radiologic studies:   ____________________________________________ Other: Last echocardiogram 12/09/2019:  1. Left ventricular ejection fraction, by estimation, is 60 to 65%. The  left ventricle has normal function. The left ventricle has no regional  wall motion abnormalities. There is mild left ventricular hypertrophy.  Left ventricular diastolic function  could not be evaluated.   2. Right ventricular systolic function is normal. The right ventricular  size is normal. There is normal pulmonary artery  systolic pressure.   3. The mitral valve is normal in structure. Trivial mitral valve  regurgitation. No evidence of mitral stenosis.   4. The aortic valve is tricuspid. Aortic valve regurgitation is not  visualized. Mild aortic valve sclerosis is present, with no evidence of  aortic valve stenosis.   5. The inferior vena cava is normal in size with greater than 50%  respiratory variability, suggesting right atrial pressure of 3 mmHg.    _____________________________________________ Assessment & Plan  Assessment: Encounter Diagnosis  Name Primary?  . Other iron deficiency anemia Yes   Reportedly heme negative during August hospital stay.  Still could have been some source of intermittent occult GI blood loss.  Mild CKD may be contributing.  If there is chronic blood loss, it is exacerbated by [facility]   Plan: Upper endoscopy and colonoscopy in the hospital outpatient  endoscopy lab next week. Bars again reviewed.  The benefits and risks of the planned procedure were described in detail with the patient or (when appropriate) their health care proxy.  Risks were outlined as including, but not limited to, bleeding, infection, perforation, adverse medication reaction leading to cardiac or pulmonary decompensation, pancreatitis (if ERCP).  The limitation of incomplete mucosal visualization was also discussed.  No guarantees or warranties were given.  Hold OAC 2 days prior, cardiology was agreeable to that and documented in their notes.  Continue iron supplements, it seems to have significantly helped blood counts lately.  30 minutes were spent on this encounter (including chart review, history/exam, counseling/coordination of care, and documentation)  [name]

## 2020-01-04 NOTE — Patient Instructions (Addendum)
If you are age 82 or older, your body mass index should be between 23-30. Your Body mass index is 36.78 kg/m. If this is out of the aforementioned range listed, please consider follow up with your Primary Care Provider.  If you are age 68 or younger, your body mass index should be between 19-25. Your Body mass index is 36.78 kg/m. If this is out of the aformentioned range listed, please consider follow up with your Primary Care Provider.   You have been scheduled for a colonoscopy. Please follow written instructions given to you at your visit today.  Please pick up your prep supplies at the pharmacy within the next 1-3 days. If you use inhalers (even only as needed), please bring them with you on the day of your procedure.    It was a pleasure to see you today!  Dr. Loletha Carrow

## 2020-01-05 ENCOUNTER — Other Ambulatory Visit (HOSPITAL_COMMUNITY)
Admission: RE | Admit: 2020-01-05 | Discharge: 2020-01-05 | Disposition: A | Discharge status: Home / Self Care | Payer: Medicare Other | Source: Ambulatory Visit | Attending: Gastroenterology | Admitting: Gastroenterology

## 2020-01-05 DIAGNOSIS — Z01812 Encounter for preprocedural laboratory examination: Secondary | ICD-10-CM | POA: Insufficient documentation

## 2020-01-05 DIAGNOSIS — Z20822 Contact with and (suspected) exposure to covid-19: Secondary | ICD-10-CM | POA: Diagnosis not present

## 2020-01-05 LAB — SARS CORONAVIRUS 2 (TAT 6-24 HRS): SARS Coronavirus 2: NEGATIVE

## 2020-01-09 ENCOUNTER — Telehealth: Payer: Self-pay

## 2020-01-09 ENCOUNTER — Ambulatory Visit (HOSPITAL_COMMUNITY): Payer: Medicare Other | Admitting: Registered Nurse

## 2020-01-09 ENCOUNTER — Ambulatory Visit (HOSPITAL_COMMUNITY)
Admission: RE | Admit: 2020-01-09 | Discharge: 2020-01-09 | Disposition: A | Discharge status: Home / Self Care | Payer: Medicare Other | Source: Ambulatory Visit | Attending: Gastroenterology | Admitting: Gastroenterology

## 2020-01-09 ENCOUNTER — Other Ambulatory Visit: Payer: Self-pay

## 2020-01-09 ENCOUNTER — Encounter (HOSPITAL_COMMUNITY): Payer: Self-pay | Admitting: Gastroenterology

## 2020-01-09 ENCOUNTER — Encounter (HOSPITAL_COMMUNITY)
Admission: RE | Disposition: A | Discharge status: Home / Self Care | Payer: Self-pay | Source: Ambulatory Visit | Attending: Gastroenterology

## 2020-01-09 DIAGNOSIS — Z87891 Personal history of nicotine dependence: Secondary | ICD-10-CM | POA: Diagnosis not present

## 2020-01-09 DIAGNOSIS — K5730 Diverticulosis of large intestine without perforation or abscess without bleeding: Secondary | ICD-10-CM | POA: Diagnosis not present

## 2020-01-09 DIAGNOSIS — K6289 Other specified diseases of anus and rectum: Secondary | ICD-10-CM | POA: Diagnosis not present

## 2020-01-09 DIAGNOSIS — D509 Iron deficiency anemia, unspecified: Secondary | ICD-10-CM

## 2020-01-09 DIAGNOSIS — H9190 Unspecified hearing loss, unspecified ear: Secondary | ICD-10-CM | POA: Diagnosis not present

## 2020-01-09 DIAGNOSIS — Z86010 Personal history of colonic polyps: Secondary | ICD-10-CM | POA: Insufficient documentation

## 2020-01-09 DIAGNOSIS — D124 Benign neoplasm of descending colon: Secondary | ICD-10-CM | POA: Diagnosis not present

## 2020-01-09 DIAGNOSIS — K635 Polyp of colon: Secondary | ICD-10-CM | POA: Diagnosis not present

## 2020-01-09 HISTORY — PX: POLYPECTOMY: SHX5525

## 2020-01-09 HISTORY — PX: COLONOSCOPY WITH PROPOFOL: SHX5780

## 2020-01-09 HISTORY — DX: Dependence on supplemental oxygen: Z99.81

## 2020-01-09 HISTORY — PX: ESOPHAGOGASTRODUODENOSCOPY (EGD) WITH PROPOFOL: SHX5813

## 2020-01-09 HISTORY — PX: HEMOSTASIS CLIP PLACEMENT: SHX6857

## 2020-01-09 LAB — GLUCOSE, CAPILLARY: Glucose-Capillary: 139 mg/dL — ABNORMAL HIGH (ref 70–99)

## 2020-01-09 SURGERY — COLONOSCOPY WITH PROPOFOL
Anesthesia: Monitor Anesthesia Care

## 2020-01-09 MED ORDER — SODIUM CHLORIDE 0.9 % IV SOLN: INTRAVENOUS | Status: DC

## 2020-01-09 MED ORDER — PROPOFOL 10 MG/ML IV BOLUS
INTRAVENOUS | Status: DC | PRN
  Administered 2020-01-09: 20 mg via INTRAVENOUS

## 2020-01-09 MED ORDER — PROPOFOL 500 MG/50ML IV EMUL
INTRAVENOUS | Status: DC | PRN
  Administered 2020-01-09: 100 ug/kg/min via INTRAVENOUS

## 2020-01-09 MED ORDER — PROPOFOL 1000 MG/100ML IV EMUL
INTRAVENOUS | Status: AC
  Filled 2020-01-09: qty 100

## 2020-01-09 MED ORDER — LACTATED RINGERS IV SOLN
INTRAVENOUS | Status: DC
  Administered 2020-01-09: 1000 mL via INTRAVENOUS

## 2020-01-09 SURGICAL SUPPLY — 24 items

## 2020-01-09 NOTE — Op Note (Signed)
[facility] Patient Name: Miranda Wilkinson Procedure Date: 01/09/2020 MRN: [id number] Attending MD: Estill Cotta. Loletha Carrow , MD Date of Birth: 1936-02-03 CSN: [id number] Age: 84 Admit Type: Outpatient Procedure:                Colonoscopy Indications:              Unexplained iron deficiency anemia (see EGD report                            and 01/04/20 clinic note for clinical details) -                            Hemoglobin improved to 11.3 when last checked                            12/13/19 Providers:                Mallie Mussel L. Loletha Carrow, MD, Benay Pillow, RN, Laverda Sorenson, Technician, Courtney Heys [place], CRNA Referring MD:             Dr. Gar Ponto Medicines:                Monitored Anesthesia Care Complications:            No immediate complications. Estimated Blood Loss:     Estimated blood loss was minimal. Procedure:                Pre-Anesthesia Assessment:                           - Prior to the procedure, a History and Physical                            was performed, and patient medications and                            allergies were reviewed. The patient's tolerance of                            previous anesthesia was also reviewed. The risks                            and benefits of the procedure and the sedation                            options and risks were discussed with the patient.                            All questions were answered, and informed consent                            was obtained. Prior Anticoagulants: The patient has  taken Eliquis (apixaban), last dose was 2 days                            prior to procedure. ASA Grade Assessment: III - A                            patient with severe systemic disease. After                            reviewing the risks and benefits, the patient was                            deemed in satisfactory condition to undergo the                             procedure.                           - Prior to the procedure, a History and Physical                            was performed, and patient medications and                            allergies were reviewed. The patient's tolerance of                            previous anesthesia was also reviewed. The risks                            and benefits of the procedure and the sedation                            options and risks were discussed with the patient.                            All questions were answered, and informed consent                            was obtained. Prior Anticoagulants: The patient has                            taken Eliquis (apixaban), last dose was 2 days                            prior to procedure. ASA Grade Assessment: III - A                            patient with severe systemic disease. After                            reviewing the risks and benefits, the patient was  deemed in satisfactory condition to undergo the                            procedure.                           After obtaining informed consent, the colonoscope                            was passed under direct vision. Throughout the                            procedure, the patient's blood pressure, pulse, and                            oxygen saturations were monitored continuously. The                            CF-HQ190L ([id number]) Olympus colonoscope was                            introduced through the anus and advanced to the the                            terminal ileum, with identification of the                            appendiceal orifice and IC valve. The colonoscopy                            was somewhat difficult due to a redundant colon.                            Successful completion of the procedure was aided by                            using manual pressure. The patient tolerated the                            procedure well. The quality  of the bowel                            preparation was good. The terminal ileum, ileocecal                            valve, appendiceal orifice, and rectum were                            photographed. Scope In: 9:52:15 AM Scope Out: 10:10:04 AM Scope Withdrawal Time: 0 hours 13 minutes 4 seconds  Total Procedure Duration: 0 hours 17 minutes 49 seconds  Findings:      The digital rectal exam findings include decreased sphincter tone.      The terminal ileum appeared normal.      Multiple diverticula were found in  the left colon and right colon.      An 8-10 mm polyp was found in the proximal descending colon. The polyp       was semi-sessile. The polyp was removed with a cold snare. Resection and       retrieval were complete. To prevent bleeding post-intervention, one       hemostatic clip was successfully placed (MR conditional). There was no       bleeding at the end of the procedure.      The exam was otherwise without abnormality on direct and retroflexion       views. Impression:               - Decreased sphincter tone found on digital rectal                            exam.                           - The examined portion of the ileum was normal.                           - Diverticulosis in the left colon and in the right                            colon.                           - One 8-10 mm polyp in the proximal descending                            colon, removed with a cold snare. Resected and                            retrieved. Clip (MR conditional) was placed.                           - The examination was otherwise normal on direct                            and retroflexion views. Moderate Sedation:      MAC sedation used Recommendation:           - Patient has a contact number available for                            emergencies. The signs and symptoms of potential                            delayed complications were discussed with the                             patient. Return to normal activities tomorrow.                            Written discharge instructions were provided to the  patient.                           - Resume previous diet.                           - Resume Eliquis (apixaban) at prior dose tomorrow.                           - Await pathology results.                           - No repeat colonoscopy due to age.                           - To visualize the small bowel, perform video                            capsule endoscopy at appointment to be scheduled.                            (no source of IDA seen on either EGD or colonoscopy)                           - Return to primary care physician for                            post-hospital follow up and CBC (if not done in                            last 4 weeks) Procedure Code(s):        --- Professional ---                           [phone], Colonoscopy, flexible; with removal of                            tumor(s), polyp(s), or other lesion(s) by snare                            technique Diagnosis Code(s):        --- Professional ---                           K62.89, Other specified diseases of anus and rectum                           K63.5, Polyp of colon                           D50.9, Iron deficiency anemia, unspecified                           K57.30, Diverticulosis of large intestine without  perforation or abscess without bleeding CPT copyright 2019 American Medical Association. All rights reserved. The codes documented in this report are preliminary and upon coder review may  be revised to meet current compliance requirements. Khalie Wince L. Loletha Carrow, MD 01/09/2020 10:22:19 AM This report has been signed electronically. Number of Addenda: 0

## 2020-01-09 NOTE — H&P (Signed)
History:  This patient presents for endoscopic testing for Iron deficiency anemia (see 01/04/20 office note for details).  [place] [name] Referring physician: Caryl Bis, MD  Past Medical History: Past Medical History:  Diagnosis Date  . Allergy    seasonal allergies  . Anemia    IDA- on iron supplement  . Anxiety   . Atrial fibrillation ([place])   . Blood transfusion without reported diagnosis    2021- due to IDA/ low hemo (7.3)  . Cancer ([place]) 1976   bilaterl thyroid ca  . Diabetes mellitus without complication ([place])    on meds  . GERD (gastroesophageal reflux disease)    on meds---OTC  . Headache(784.0)    hx migraines  . Hyperlipidemia    on meds  . Hypertension    on meds  . Hypothyroidism    thyroidectomy in 1976- on meds at  this time  . On supplemental oxygen therapy    2 L at night  . Shortness of breath   . Sinus congestion   . Sleep apnea    study done in [place], [place], use CPAP----with home O2 concentrater at night with CPAP     Past Surgical History: Past Surgical History:  Procedure Laterality Date  . APPENDECTOMY    . CARDIOVERSION N/A 10/27/2019   Procedure: CARDIOVERSION;  Surgeon: [name], MD;  Location: [facility] ENDOSCOPY;  Service: Cardiovascular;  Laterality: N/A;  . CHOLECYSTECTOMY    . OPEN REDUCTION INTERNAL FIXATION (ORIF) TIBIA/FIBULA FRACTURE Right 11/03/2013   Procedure: OPEN REDUCTION INTERNAL FIXATION (ORIF) RIGHT TIBIA/FIBULA PILON FRACTURE;  Surgeon: Wylene Simmer, MD;  Location: [facility];  Service: Orthopedics;  Laterality: Right;  . OVARIAN CYST SURGERY Left    age 78  . THYROIDECTOMY Bilateral 1976  . TUBAL LIGATION      Allergies: No Known Allergies  Outpatient Meds: Current Facility-Administered Medications  Medication Dose Route Frequency Provider Last Rate Last Admin  . 0.9 %  sodium chloride infusion   Intravenous Continuous Danis, Estill Cotta III, MD      . lactated ringers infusion   Intravenous Continuous [name], MD 10 mL/hr at 01/09/20 0929 Continued from Pre-op at 01/09/20 0929      ___________________________________________________________________ Objective   Exam:  BP (!) 168/76   Pulse 69   Resp (!) 24   Ht 5\' 6"  (1.676 m)   Wt 98.4 kg   SpO2 97%   BMI 35.01 kg/m    CV: RRR without murmur, S1/S2, no JVD, no peripheral edema  Resp: clear to auscultation bilaterally, normal RR and effort noted  GI: soft,obese, no tenderness, with active bowel sounds.  Neuro: awake, alert and oriented x 3. Normal gross motor function and fluent speech Hard of hearing - using headphone amplifier assist device  Assessment:  IDA  Plan:  EGD and colonoscopy   [name]

## 2020-01-09 NOTE — Interval H&P Note (Signed)
History and Physical Interval Note:  01/09/2020 9:34 AM  Miranda Wilkinson  has presented today for surgery, with the diagnosis of Iron deficiency anemia, history of colon polyps.  The various methods of treatment have been discussed with the patient and family. After consideration of risks, benefits and other options for treatment, the patient has consented to  Procedure(s): COLONOSCOPY WITH PROPOFOL (N/A) ESOPHAGOGASTRODUODENOSCOPY (EGD) WITH PROPOFOL (N/A) as a surgical intervention.  The patient's history has been reviewed, patient examined, no change in status, stable for surgery.  I have reviewed the patient's chart and labs.  Questions were answered to the patient's satisfaction.     [name]

## 2020-01-09 NOTE — Discharge Instructions (Signed)
YOU HAD AN ENDOSCOPIC PROCEDURE TODAY: Refer to the procedure report and other information in the discharge instructions given to you for any specific questions about what was found during the examination. If this information does not answer your questions, please call [place] office at [phone] to clarify.   YOU SHOULD EXPECT: Some feelings of bloating in the abdomen. Passage of more gas than usual. Walking can help get rid of the air that was put into your GI tract during the procedure and reduce the bloating. If you had a lower endoscopy (such as a colonoscopy or flexible sigmoidoscopy) you may notice spotting of blood in your stool or on the toilet paper. Some abdominal soreness may be present for a day or two, also.  DIET: Your first meal following the procedure should be a light meal and then it is ok to progress to your normal diet. A half-sandwich or bowl of soup is an example of a good first meal. Heavy or fried foods are harder to digest and may make you feel nauseous or bloated. Drink plenty of fluids but you should avoid alcoholic beverages for 24 hours. If you had a esophageal dilation, please see attached instructions for diet.    ___________________________________________   Resume your Eliquis tomorrow morning at your usual dose  ACTIVITY: Your care partner should take you home directly after the procedure. You should plan to take it easy, moving slowly for the rest of the day. You can resume normal activity the day after the procedure however YOU SHOULD NOT DRIVE, use power tools, machinery or perform tasks that involve climbing or major physical exertion for 24 hours (because of the sedation medicines used during the test).   SYMPTOMS TO REPORT IMMEDIATELY: A gastroenterologist can be reached at any hour. Please call [phone]  for any of the following symptoms:  . Following lower endoscopy (colonoscopy, flexible sigmoidoscopy) Excessive amounts of blood in the stool    Significant tenderness, worsening of abdominal pains  Swelling of the abdomen that is new, acute  Fever of 100 or higher  . Following upper endoscopy (EGD, EUS, ERCP, esophageal dilation) Vomiting of blood or coffee ground material  New, significant abdominal pain  New, significant chest pain or pain under the shoulder blades  Painful or persistently difficult swallowing  New shortness of breath  Black, tarry-looking or red, bloody stools  FOLLOW UP:  If any biopsies were taken you will be contacted by phone or by letter within the next 1-3 weeks. Call [phone]  if you have not heard about the biopsies in 3 weeks.  Please also call with any specific questions about appointments or follow up tests.

## 2020-01-09 NOTE — Op Note (Signed)
[facility] Patient Name: Miranda Wilkinson Procedure Date: 01/09/2020 MRN: [id number] Attending MD: Estill Cotta. Danis , MD Date of Birth: 08-12-1935 CSN: [id number] Age: 83 Admit Type: Outpatient Procedure:                Upper GI endoscopy Indications:              Unexplained iron deficiency anemia (patient also                            had a 1-2 day period of black stool > 2 months                            ago). Nadir Hgb 6.5, FOBT negative at that time. Providers:                Estill Cotta. Loletha Carrow, MD, Benay Pillow, RN, Laverda Sorenson, Technician, Courtney Heys Armistead, CRNA Referring MD:              Medicines:                Monitored Anesthesia Care Complications:            No immediate complications. Estimated Blood Loss:     Estimated blood loss: none. Procedure:                Pre-Anesthesia Assessment:                           - Prior to the procedure, a History and Physical                            was performed, and patient medications and                            allergies were reviewed. The patient's tolerance of                            previous anesthesia was also reviewed. The risks                            and benefits of the procedure and the sedation                            options and risks were discussed with the patient.                            All questions were answered, and informed consent                            was obtained. Prior Anticoagulants: The patient has                            taken Eliquis (apixaban), last dose was 2 days  prior to procedure. ASA Grade Assessment: III - A                            patient with severe systemic disease. After                            reviewing the risks and benefits, the patient was                            deemed in satisfactory condition to undergo the                            procedure.                           After  obtaining informed consent, the endoscope was                            passed under direct vision. Throughout the                            procedure, the patient's blood pressure, pulse, and                            oxygen saturations were monitored continuously. The                            GIF-H190 ([id number]) Olympus gastroscope was                            introduced through the mouth, and advanced to the                            third part of duodenum. The upper GI endoscopy was                            accomplished without difficulty. The patient                            tolerated the procedure well. Scope In: Scope Out: Findings:      The esophagus was normal.      The stomach was normal.      The cardia and gastric fundus were normal on retroflexion.      The examined duodenum was normal. Impression:               - Normal esophagus.                           - Normal stomach.                           - Normal examined duodenum.                           - No specimens collected. Moderate Sedation:  MAC sedation used Recommendation:           - Patient has a contact number available for                            emergencies. The signs and symptoms of potential                            delayed complications were discussed with the                            patient. Return to normal activities tomorrow.                            Written discharge instructions were provided to the                            patient.                           - Resume previous diet.                           - Resume Eliquis (apixaban) at prior dose tomorrow.                           - See the other procedure note for documentation of                            additional recommendations. Procedure Code(s):        --- Professional ---                           [phone], Esophagogastroduodenoscopy, flexible,                            transoral; diagnostic, including  collection of                            specimen(s) by brushing or washing, when performed                            (separate procedure) Diagnosis Code(s):        --- Professional ---                           D50.9, Iron deficiency anemia, unspecified CPT copyright 2019 American Medical Association. All rights reserved. The codes documented in this report are preliminary and upon coder review may  be revised to meet current compliance requirements. Ilena Dieckman L. Loletha Carrow, MD 01/09/2020 10:15:33 AM This report has been signed electronically. Number of Addenda: 0

## 2020-01-09 NOTE — Telephone Encounter (Signed)
Spoke with patient's daughter, [name], to schedule patient's capsule endoscopy. Patient has been scheduled for 01/25/20 at 8:30 AM, daughter is aware that pt will need to return the same day at 4 PM. Instructions have been sent via My Chart. Renee verbalized understanding and had no concerns at the end of the call.

## 2020-01-09 NOTE — Anesthesia Preprocedure Evaluation (Signed)
Anesthesia Evaluation  Patient identified by MRN, date of birth, ID band Patient awake    Reviewed: Allergy & Precautions, NPO status , Patient's Chart, lab work & pertinent test results  Airway Mallampati: IV  TM Distance: >3 FB Neck ROM: Full    Dental no notable dental hx.    Pulmonary shortness of breath, sleep apnea and Continuous Positive Airway Pressure Ventilation , COPD,  oxygen dependent, former smoker,    Pulmonary exam normal breath sounds clear to auscultation       Cardiovascular hypertension, Pt. on home beta blockers Normal cardiovascular exam+ dysrhythmias Atrial Fibrillation  Rhythm:Regular Rate:Normal  ECG: SB, rate 57  ECHO: 1. Left ventricular ejection fraction, by estimation, is 60 to 65%. The left ventricle has normal function. The left ventricle has no regional wall motion abnormalities. There is mild left ventricular hypertrophy. Left ventricular diastolic function could not be evaluated. 2. Right ventricular systolic function is normal. The right ventricular size is normal. There is normal pulmonary artery systolic pressure. 3. The mitral valve is normal in structure. Trivial mitral valve regurgitation. No evidence of mitral stenosis. 4. The aortic valve is tricuspid. Aortic valve regurgitation is not visualized. Mild aortic valve sclerosis is present, with no evidence of aortic valve stenosis. 5. The inferior vena cava is normal in size with greater than 50% respiratory variability, suggesting right atrial pressure of 3 mmHg.   Neuro/Psych  Headaches, PSYCHIATRIC DISORDERS Anxiety Depression    GI/Hepatic Neg liver ROS, Bowel prep,GERD  Medicated and Controlled,  Endo/Other  diabetes, Oral Hypoglycemic AgentsHypothyroidism   Renal/GU negative Renal ROS     Musculoskeletal negative musculoskeletal ROS (+)   Abdominal (+) + obese,   Peds  Hematology HLD   Anesthesia Other Findings Iron  deficiency anemia history of colon polyps  Reproductive/Obstetrics                             Anesthesia Physical Anesthesia Plan  ASA: IV  Anesthesia Plan: MAC   Post-op Pain Management:    Induction: Intravenous  PONV Risk Score and Plan: 2 and Propofol infusion and Treatment may vary due to age or medical condition  Airway Management Planned: Mask and Nasal Cannula  Additional Equipment:   Intra-op Plan:   Post-operative Plan:   Informed Consent: I have reviewed the patients History and Physical, chart, labs and discussed the procedure including the risks, benefits and alternatives for the proposed anesthesia with the patient or authorized representative who has indicated his/her understanding and acceptance.     Dental advisory given  Plan Discussed with: CRNA and Surgeon  Anesthesia Plan Comments:         Anesthesia Quick Evaluation

## 2020-01-09 NOTE — Transfer of Care (Signed)
Immediate Anesthesia Transfer of Care Note  Patient: Miranda Wilkinson  Procedure(s) Performed: COLONOSCOPY WITH PROPOFOL (N/A ) ESOPHAGOGASTRODUODENOSCOPY (EGD) WITH PROPOFOL (N/A ) BIOPSY HEMOSTASIS CLIP PLACEMENT  Patient Location: PACU and Endoscopy Unit  Anesthesia Type:MAC  Level of Consciousness: awake, alert , oriented and patient cooperative  Airway & Oxygen Therapy: Patient Spontanous Breathing and Patient connected to face mask oxygen  Post-op Assessment: Report given to RN, Post -op Vital signs reviewed and stable and Patient moving all extremities  Post vital signs: Reviewed and stable  Last Vitals:  Vitals Value Taken Time  BP    Temp    Pulse 62 01/09/20 1017  Resp 22 01/09/20 1017  SpO2 100 % 01/09/20 1017  Vitals shown include unvalidated device data.  Last Pain:  Vitals:   01/09/20 0918  PainSc: 0-No pain         Complications: No complications documented.

## 2020-01-09 NOTE — Anesthesia Procedure Notes (Signed)
Procedure Name: MAC Date/Time: 01/09/2020 9:40 AM Performed by: Victoriano Lain, CRNA Pre-anesthesia Checklist: Patient identified, Emergency Drugs available, Patient being monitored, Timeout performed and Suction available Patient Re-evaluated:Patient Re-evaluated prior to induction Oxygen Delivery Method: Simple face mask Dental Injury: Teeth and Oropharynx as per pre-operative assessment

## 2020-01-09 NOTE — Telephone Encounter (Signed)
-----   Message from Doran Stabler, MD sent at 01/09/2020 11:23 AM EDT ----- This patient has an EGD /colonoscopy today at [facility].  She needs to be scheduled for a capsule study at the office.  She is quite hard of hearing, so you will need to speak with her daughter, who is expecting your call.  - HD

## 2020-01-09 NOTE — Anesthesia Postprocedure Evaluation (Signed)
Anesthesia Post Note  Patient: CAILEIGH CANCHE  Procedure(s) Performed: COLONOSCOPY WITH PROPOFOL (N/A ) ESOPHAGOGASTRODUODENOSCOPY (EGD) WITH PROPOFOL (N/A ) HEMOSTASIS CLIP PLACEMENT POLYPECTOMY     Patient location during evaluation: Endoscopy Anesthesia Type: MAC Level of consciousness: awake and alert Pain management: pain level controlled Vital Signs Assessment: post-procedure vital signs reviewed and stable Respiratory status: spontaneous breathing, nonlabored ventilation, respiratory function stable and patient connected to nasal cannula oxygen Cardiovascular status: stable and blood pressure returned to baseline Postop Assessment: no apparent nausea or vomiting Anesthetic complications: no   No complications documented.  Last Vitals:  Vitals:   01/09/20 1020 01/09/20 1030  BP: (!) 138/56 (!) 155/74  Pulse: 67 67  Resp: (!) 23 (!) 23  Temp:    SpO2: 100% 94%    Last Pain:  Vitals:   01/09/20 1020  TempSrc:   PainSc: 0-No pain                 Wrenn Willcox P Naftali Carchi

## 2020-01-10 ENCOUNTER — Other Ambulatory Visit: Payer: Self-pay

## 2020-01-10 DIAGNOSIS — D508 Other iron deficiency anemias: Secondary | ICD-10-CM

## 2020-01-10 DIAGNOSIS — D500 Iron deficiency anemia secondary to blood loss (chronic): Secondary | ICD-10-CM

## 2020-01-10 LAB — SURGICAL PATHOLOGY

## 2020-01-11 ENCOUNTER — Encounter: Payer: Self-pay | Admitting: Gastroenterology

## 2020-01-11 ENCOUNTER — Encounter (HOSPITAL_COMMUNITY): Payer: Self-pay | Admitting: Gastroenterology

## 2020-01-11 DIAGNOSIS — N183 Chronic kidney disease, stage 3 unspecified: Secondary | ICD-10-CM | POA: Diagnosis not present

## 2020-01-11 DIAGNOSIS — I129 Hypertensive chronic kidney disease with stage 1 through stage 4 chronic kidney disease, or unspecified chronic kidney disease: Secondary | ICD-10-CM | POA: Diagnosis not present

## 2020-01-11 DIAGNOSIS — K219 Gastro-esophageal reflux disease without esophagitis: Secondary | ICD-10-CM | POA: Diagnosis not present

## 2020-01-11 DIAGNOSIS — I4819 Other persistent atrial fibrillation: Secondary | ICD-10-CM | POA: Diagnosis not present

## 2020-01-11 DIAGNOSIS — D509 Iron deficiency anemia, unspecified: Secondary | ICD-10-CM | POA: Diagnosis not present

## 2020-01-11 DIAGNOSIS — E1122 Type 2 diabetes mellitus with diabetic chronic kidney disease: Secondary | ICD-10-CM | POA: Diagnosis not present

## 2020-01-12 DIAGNOSIS — Z23 Encounter for immunization: Secondary | ICD-10-CM | POA: Diagnosis not present

## 2020-01-12 DIAGNOSIS — A419 Sepsis, unspecified organism: Secondary | ICD-10-CM | POA: Diagnosis not present

## 2020-01-15 DIAGNOSIS — A419 Sepsis, unspecified organism: Secondary | ICD-10-CM | POA: Diagnosis not present

## 2020-01-15 DIAGNOSIS — R32 Unspecified urinary incontinence: Secondary | ICD-10-CM | POA: Diagnosis not present

## 2020-01-15 DIAGNOSIS — E785 Hyperlipidemia, unspecified: Secondary | ICD-10-CM | POA: Diagnosis not present

## 2020-01-15 DIAGNOSIS — G4733 Obstructive sleep apnea (adult) (pediatric): Secondary | ICD-10-CM | POA: Diagnosis not present

## 2020-01-15 DIAGNOSIS — I129 Hypertensive chronic kidney disease with stage 1 through stage 4 chronic kidney disease, or unspecified chronic kidney disease: Secondary | ICD-10-CM | POA: Diagnosis not present

## 2020-01-15 DIAGNOSIS — F419 Anxiety disorder, unspecified: Secondary | ICD-10-CM | POA: Diagnosis not present

## 2020-01-15 DIAGNOSIS — Z9181 History of falling: Secondary | ICD-10-CM | POA: Diagnosis not present

## 2020-01-15 DIAGNOSIS — D509 Iron deficiency anemia, unspecified: Secondary | ICD-10-CM | POA: Diagnosis not present

## 2020-01-15 DIAGNOSIS — K219 Gastro-esophageal reflux disease without esophagitis: Secondary | ICD-10-CM | POA: Diagnosis not present

## 2020-01-15 DIAGNOSIS — G43909 Migraine, unspecified, not intractable, without status migrainosus: Secondary | ICD-10-CM | POA: Diagnosis not present

## 2020-01-15 DIAGNOSIS — Z7901 Long term (current) use of anticoagulants: Secondary | ICD-10-CM | POA: Diagnosis not present

## 2020-01-15 DIAGNOSIS — E1122 Type 2 diabetes mellitus with diabetic chronic kidney disease: Secondary | ICD-10-CM | POA: Diagnosis not present

## 2020-01-15 DIAGNOSIS — E039 Hypothyroidism, unspecified: Secondary | ICD-10-CM | POA: Diagnosis not present

## 2020-01-15 DIAGNOSIS — Z85850 Personal history of malignant neoplasm of thyroid: Secondary | ICD-10-CM | POA: Diagnosis not present

## 2020-01-15 DIAGNOSIS — M479 Spondylosis, unspecified: Secondary | ICD-10-CM | POA: Diagnosis not present

## 2020-01-15 DIAGNOSIS — N183 Chronic kidney disease, stage 3 unspecified: Secondary | ICD-10-CM | POA: Diagnosis not present

## 2020-01-15 DIAGNOSIS — I4819 Other persistent atrial fibrillation: Secondary | ICD-10-CM | POA: Diagnosis not present

## 2020-01-15 DIAGNOSIS — Z87440 Personal history of urinary (tract) infections: Secondary | ICD-10-CM | POA: Diagnosis not present

## 2020-01-20 DIAGNOSIS — K219 Gastro-esophageal reflux disease without esophagitis: Secondary | ICD-10-CM | POA: Diagnosis not present

## 2020-01-20 DIAGNOSIS — E1122 Type 2 diabetes mellitus with diabetic chronic kidney disease: Secondary | ICD-10-CM | POA: Diagnosis not present

## 2020-01-20 DIAGNOSIS — N183 Chronic kidney disease, stage 3 unspecified: Secondary | ICD-10-CM | POA: Diagnosis not present

## 2020-01-20 DIAGNOSIS — I129 Hypertensive chronic kidney disease with stage 1 through stage 4 chronic kidney disease, or unspecified chronic kidney disease: Secondary | ICD-10-CM | POA: Diagnosis not present

## 2020-01-20 DIAGNOSIS — I4819 Other persistent atrial fibrillation: Secondary | ICD-10-CM | POA: Diagnosis not present

## 2020-01-20 DIAGNOSIS — D509 Iron deficiency anemia, unspecified: Secondary | ICD-10-CM | POA: Diagnosis not present

## 2020-01-23 ENCOUNTER — Other Ambulatory Visit: Payer: Self-pay | Admitting: *Deleted

## 2020-01-23 MED ORDER — DILTIAZEM HCL ER COATED BEADS 240 MG PO CP24: 240.0000 mg | ORAL_CAPSULE | Freq: Every day | ORAL | 3 refills | Status: DC

## 2020-01-23 MED ORDER — DILTIAZEM HCL ER COATED BEADS 240 MG PO CP24
240.0000 mg | ORAL_CAPSULE | Freq: Every day | ORAL | 3 refills | Status: DC
Start: 2020-01-23 — End: 2020-01-23

## 2020-01-25 ENCOUNTER — Other Ambulatory Visit: Payer: Self-pay | Admitting: *Deleted

## 2020-01-25 ENCOUNTER — Ambulatory Visit (INDEPENDENT_AMBULATORY_CARE_PROVIDER_SITE_OTHER): Payer: Medicare Other | Admitting: Gastroenterology

## 2020-01-25 DIAGNOSIS — D500 Iron deficiency anemia secondary to blood loss (chronic): Secondary | ICD-10-CM

## 2020-01-25 MED ORDER — DILTIAZEM HCL ER COATED BEADS 240 MG PO CP24: 240.0000 mg | ORAL_CAPSULE | Freq: Every day | ORAL | 3 refills | Status: DC

## 2020-01-25 NOTE — Progress Notes (Signed)
SB3 Exp: 2021-02-06 LOT: [id number]  Patient arrived for VCE. Reported the prep went well. This nurse explained capsule  process and dietary restrictions for the next few hours. Patient verbalized understanding. Opened capsule, ensured capsule was flashing prior to the patient swallowing the capsule. Patient swallowed capsule without difficulty. Patient given written instructions for her diet today. Patient told to call the office with any questions and if no capsule was visualized after 72 hours. No further questions by the conclusion of the visit.

## 2020-01-25 NOTE — Patient Instructions (Addendum)
Do not eat or drink for 4 hours after ingesting the capsule  You may have a clear liquid diet at 10:30 am  You may have a light lunch beginning at 12:30 ( ex 1/2 sandwich and a bowl of soup)  You may resume your normal diet at 5:00 pm  If you experience any abdominal pain, nausea ,or vomiting after ingesting the capsule please call [phone] to notify the nurse  You may not have an MRI until you have confirmation that you have passed the capsule.

## 2020-01-30 DIAGNOSIS — D529 Folate deficiency anemia, unspecified: Secondary | ICD-10-CM | POA: Diagnosis not present

## 2020-01-30 DIAGNOSIS — D500 Iron deficiency anemia secondary to blood loss (chronic): Secondary | ICD-10-CM | POA: Diagnosis not present

## 2020-01-30 DIAGNOSIS — D519 Vitamin B12 deficiency anemia, unspecified: Secondary | ICD-10-CM | POA: Diagnosis not present

## 2020-02-01 ENCOUNTER — Telehealth: Payer: Self-pay | Admitting: Gastroenterology

## 2020-02-01 DIAGNOSIS — E1122 Type 2 diabetes mellitus with diabetic chronic kidney disease: Secondary | ICD-10-CM | POA: Diagnosis not present

## 2020-02-01 DIAGNOSIS — T184XXA Foreign body in colon, initial encounter: Secondary | ICD-10-CM

## 2020-02-01 DIAGNOSIS — N183 Chronic kidney disease, stage 3 unspecified: Secondary | ICD-10-CM | POA: Diagnosis not present

## 2020-02-01 DIAGNOSIS — I129 Hypertensive chronic kidney disease with stage 1 through stage 4 chronic kidney disease, or unspecified chronic kidney disease: Secondary | ICD-10-CM | POA: Diagnosis not present

## 2020-02-01 DIAGNOSIS — I4819 Other persistent atrial fibrillation: Secondary | ICD-10-CM | POA: Diagnosis not present

## 2020-02-01 DIAGNOSIS — K219 Gastro-esophageal reflux disease without esophagitis: Secondary | ICD-10-CM | POA: Diagnosis not present

## 2020-02-01 DIAGNOSIS — D509 Iron deficiency anemia, unspecified: Secondary | ICD-10-CM | POA: Diagnosis not present

## 2020-02-01 NOTE — Telephone Encounter (Signed)
(  This patient is quite hard of hearing and her daughter helps manage her care, so it would be best to speak with her daughter.)  This patient underwent video capsule study at the office on 01/25/2023 because of iron deficiency anemia, no source found on recent EGD and colonoscopy.  Unfortunately, the video capsule was retained in the stomach for the entire 8 hours of battery life, thus the small bowel could not be evaluated.  The capsule will eventually pass.  Please find out if the patient observed capsule passage with any bowel movement since then.   If not, then a KUB is needed early next week.  Then we will have 2 choices, depending upon how they would like to proceed.  1 -once we have ensured the capsule passed through the digestive tract, plan to repeat the capsule study with endoscopic placement of the capsule into the small bowel using a special delivery device.  This would require a repeat upper endoscopy in the [facility] end[facility] lab, of course with sedation and the need to be off anticoagulation before procedure again.  2 -follow hemoglobin closely with primary care and only repeat the study if patient's anemia does not respond as expected to iron replacement. I favor this option since it is the safest thing for Izora Gala.  - HD

## 2020-02-01 NOTE — Telephone Encounter (Signed)
Spoke with patient and her daughter, [name], she states that the first couple of days the patient did not look for the capsule, they are unsure if it has been passed. Advised that she will need to come by early next week for an x-ray so that we can located the capsule if it is still there. Patient stated that she would not like to proceed with procedure and would like to continue to monitor lab work with PCP, she states that she recently had labs this week and was told that they were normal, PCP refilled iron prescription. Advised that no appt is necessary is needed for x-ray and that they could stop by at their convenience early next week and we will call with the imaging results. Daughter verbalized understanding of all information.   X-ray order in epic.

## 2020-02-27 DIAGNOSIS — R300 Dysuria: Secondary | ICD-10-CM | POA: Diagnosis not present

## 2020-02-27 DIAGNOSIS — Z6837 Body mass index (BMI) 37.0-37.9, adult: Secondary | ICD-10-CM | POA: Diagnosis not present

## 2020-02-28 ENCOUNTER — Encounter: Payer: Self-pay | Admitting: Cardiology

## 2020-02-28 ENCOUNTER — Other Ambulatory Visit: Payer: Self-pay

## 2020-02-28 ENCOUNTER — Ambulatory Visit (INDEPENDENT_AMBULATORY_CARE_PROVIDER_SITE_OTHER): Payer: Medicare Other | Admitting: Cardiology

## 2020-02-28 VITALS — BP 148/68 | HR 73 | Ht 66.0 in | Wt 220.0 lb

## 2020-02-28 DIAGNOSIS — D6869 Other thrombophilia: Secondary | ICD-10-CM | POA: Diagnosis not present

## 2020-02-28 DIAGNOSIS — I4819 Other persistent atrial fibrillation: Secondary | ICD-10-CM

## 2020-02-28 DIAGNOSIS — R0602 Shortness of breath: Secondary | ICD-10-CM | POA: Diagnosis not present

## 2020-02-28 MED ORDER — AMIODARONE HCL 200 MG PO TABS: ORAL_TABLET | ORAL | 3 refills | Status: DC

## 2020-02-28 NOTE — Patient Instructions (Addendum)
Medication Instructions:  Your physician has recommended you make the following change in your medication:   1.   STOP diltiazem  2.  START taking amiodarone 200 mg--  A.  Take 2 tablets (400 mg total) by mouth twice a day for 5 days  B.  THEN reduce to 1 tablet (200 mg) by mouth once a day  Labwork: None ordered.  Testing/Procedures: None ordered.  Follow-Up: Your physician wants you to follow-up in: 6-8 weeks with Dr. Quentin Ore.     April 17, 2020 at 1:45 PM  Any Other Special Instructions Will Be Listed Below (If Applicable).  If you need a refill on your cardiac medications before your next appointment, please call your pharmacy.   Amiodarone tablets What is this medicine? AMIODARONE (a MEE oh da rone) is an antiarrhythmic drug. It helps make your heart beat regularly. Because of the side effects caused by this medicine, it is only used when other medicines have not worked. It is usually used for heartbeat problems that may be life threatening. This medicine may be used for other purposes; ask your health care provider or pharmacist if you have questions. COMMON BRAND NAME(S): Cordarone, Pacerone What should I tell my health care provider before I take this medicine? They need to know if you have any of these conditions:  liver disease  lung disease  other heart problems  thyroid disease  an unusual or allergic reaction to amiodarone, iodine, other medicines, foods, dyes, or preservatives  pregnant or trying to get pregnant  breast-feeding How should I use this medicine? Take this medicine by mouth with a glass of water. Follow the directions on the prescription label. You can take this medicine with or without food. However, you should always take it the same way each time. Take your doses at regular intervals. Do not take your medicine more often than directed. Do not stop taking except on the advice of your doctor or health care professional. A special MedGuide  will be given to you by the pharmacist with each prescription and refill. Be sure to read this information carefully each time. Talk to your pediatrician regarding the use of this medicine in children. Special care may be needed. Overdosage: If you think you have taken too much of this medicine contact a poison control center or emergency room at once. NOTE: This medicine is only for you. Do not share this medicine with others. What if I miss a dose? If you miss a dose, take it as soon as you can. If it is almost time for your next dose, take only that dose. Do not take double or extra doses. What may interact with this medicine? Do not take this medicine with any of the following medications:  abarelix  apomorphine  arsenic trioxide  certain antibiotics like erythromycin, gemifloxacin, levofloxacin, pentamidine  certain medicines for depression like amoxapine, tricyclic antidepressants  certain medicines for fungal infections like fluconazole, itraconazole, ketoconazole, posaconazole, voriconazole  certain medicines for irregular heart beat like disopyramide, dronedarone, ibutilide, propafenone, sotalol  certain medicines for malaria like chloroquine, halofantrine  cisapride  droperidol  haloperidol  hawthorn  maprotiline  methadone  phenothiazines like chlorpromazine, mesoridazine, thioridazine  pimozide  ranolazine  red yeast rice  vardenafil This medicine may also interact with the following medications:  antiviral medicines for HIV or AIDS  certain medicines for blood pressure, heart disease, irregular heart beat  certain medicines for cholesterol like atorvastatin, cerivastatin, lovastatin, simvastatin  certain medicines for hepatitis C like  sofosbuvir and ledipasvir; sofosbuvir  certain medicines for seizures like phenytoin  certain medicines for thyroid problems  certain medicines that treat or prevent blood clots like  warfarin  cholestyramine  cimetidine  clopidogrel  cyclosporine  dextromethorphan  diuretics  dofetilide  fentanyl  general anesthetics  grapefruit juice  lidocaine  loratadine  methotrexate  other medicines that prolong the QT interval (cause an abnormal heart rhythm)  procainamide  quinidine  rifabutin, rifampin, or rifapentine  St. John's Wort  trazodone  ziprasidone This list may not describe all possible interactions. Give your health care provider a list of all the medicines, herbs, non-prescription drugs, or dietary supplements you use. Also tell them if you smoke, drink alcohol, or use illegal drugs. Some items may interact with your medicine. What should I watch for while using this medicine? Your condition will be monitored closely when you first begin therapy. Often, this drug is first started in a hospital or other monitored health care setting. Once you are on maintenance therapy, visit your doctor or health care professional for regular checks on your progress. Because your condition and use of this medicine carry some risk, it is a good idea to carry an identification card, necklace or bracelet with details of your condition, medications, and doctor or health care professional. Dennis Bast may get drowsy or dizzy. Do not drive, use machinery, or do anything that needs mental alertness until you know how this medicine affects you. Do not stand or sit up quickly, especially if you are an older patient. This reduces the risk of dizzy or fainting spells. This medicine can make you more sensitive to the sun. Keep out of the sun. If you cannot avoid being in the sun, wear protective clothing and use sunscreen. Do not use sun lamps or tanning beds/booths. You should have regular eye exams before and during treatment. Call your doctor if you have blurred vision, see halos, or your eyes become sensitive to light. Your eyes may get dry. It may be helpful to use a  lubricating eye solution or artificial tears solution. If you are going to have surgery or a procedure that requires contrast dyes, tell your doctor or health care professional that you are taking this medicine. What side effects may I notice from receiving this medicine? Side effects that you should report to your doctor or health care professional as soon as possible:  allergic reactions like skin rash, itching or hives, swelling of the face, lips, or tongue  blue-gray coloring of the skin  blurred vision, seeing blue green halos, increased sensitivity of the eyes to light  breathing problems  chest pain  dark urine  fast, irregular heartbeat  feeling faint or light-headed  intolerance to heat or cold  nausea or vomiting  pain and swelling of the scrotum  pain, tingling, numbness in feet, hands  redness, blistering, peeling or loosening of the skin, including inside the mouth  spitting up blood  stomach pain  sweating  unusual or uncontrolled movements of body  unusually weak or tired  weight gain or loss  yellowing of the eyes or skin Side effects that usually do not require medical attention (report to your doctor or health care professional if they continue or are bothersome):  change in sex drive or performance  constipation  dizziness  headache  loss of appetite  trouble sleeping This list may not describe all possible side effects. Call your doctor for medical advice about side effects. You may report side effects  to FDA at 1-800-FDA-1088. Where should I keep my medicine? Keep out of the reach of children. Store at room temperature between 20 and 25 degrees C (68 and 77 degrees F). Protect from light. Keep container tightly closed. Throw away any unused medicine after the expiration date. NOTE: This sheet is a summary. It may not cover all possible information. If you have questions about this medicine, talk to your doctor, pharmacist, or health  care provider.  2020 Elsevier/Gold Standard (2018-01-27 13:44:04)

## 2020-02-28 NOTE — Progress Notes (Signed)
Electrophysiology Office Follow up Visit Note:    Date:  02/28/2020   ID:  GAVIN FAIVRE, DOB 09/13/1935, MRN [id number]  PCP:  Caryl Bis, MD  [place] Cardiologist:  Carlyle Dolly, MD  [facility] Electrophysiologist:  Vickie Epley, MD    Interval History:    Miranda Wilkinson is a 84 y.o. female who presents for a follow up visit. They were last seen in clinic December 05, 2019 for persistent atrial fibrillation. Plan at that appointment was to refill her dofetilide and see back in 3 months. Since that appointment she was admitted with COPD and urinary tract infection in October 2021. During that hospitalization her dofetilide was stopped due to labile renal function. She is also struggled with anemia requiring transfusion since I last saw her.     Past Medical History:  Diagnosis Date  . Allergy    seasonal allergies  . Anemia    IDA- on iron supplement  . Anxiety   . Atrial fibrillation ([place])   . Blood transfusion without reported diagnosis    2021- due to IDA/ low hemo (7.3)  . Cancer ([place]) 1976   bilaterl thyroid ca  . Diabetes mellitus without complication ([place])    on meds  . GERD (gastroesophageal reflux disease)    on meds---OTC  . Headache(784.0)    hx migraines  . Hyperlipidemia    on meds  . Hypertension    on meds  . Hypothyroidism    thyroidectomy in 1976- on meds at  this time  . On supplemental oxygen therapy    2 L at night  . Shortness of breath   . Sinus congestion   . Sleep apnea    study done in [place], [place], use CPAP----with home O2 concentrater at night with CPAP    Past Surgical History:  Procedure Laterality Date  . APPENDECTOMY    . CARDIOVERSION N/A 10/27/2019   Procedure: CARDIOVERSION;  Surgeon: [name], MD;  Location: [facility] ENDOSCOPY;  Service: Cardiovascular;  Laterality: N/A;  . CHOLECYSTECTOMY    . COLONOSCOPY WITH PROPOFOL N/A 01/09/2020   Procedure: COLONOSCOPY WITH PROPOFOL;  Surgeon: Doran Stabler, MD;  Location: [facility] ENDOSCOPY;  Service: Gastroenterology;  Laterality: N/A;  . ESOPHAGOGASTRODUODENOSCOPY (EGD) WITH PROPOFOL N/A 01/09/2020   Procedure: ESOPHAGOGASTRODUODENOSCOPY (EGD) WITH PROPOFOL;  Surgeon: Doran Stabler, MD;  Location: [facility] ENDOSCOPY;  Service: Gastroenterology;  Laterality: N/A;  . HEMOSTASIS CLIP PLACEMENT  01/09/2020   Procedure: HEMOSTASIS CLIP PLACEMENT;  Surgeon: Doran Stabler, MD;  Location: [facility] ENDOSCOPY;  Service: Gastroenterology;;  . OPEN REDUCTION INTERNAL FIXATION (ORIF) TIBIA/FIBULA FRACTURE Right 11/03/2013   Procedure: OPEN REDUCTION INTERNAL FIXATION (ORIF) RIGHT TIBIA/FIBULA PILON FRACTURE;  Surgeon: Wylene Simmer, MD;  Location: [facility];  Service: Orthopedics;  Laterality: Right;  . OVARIAN CYST SURGERY Left    age 51  . POLYPECTOMY  01/09/2020   Procedure: POLYPECTOMY;  Surgeon: Doran Stabler, MD;  Location: [facility] END[facility];  Service: Gastroenterology;;  . THYROIDECTOMY Bilateral 1976  . TUBAL LIGATION      Current Medications: Current Meds  Medication Sig  . acetaminophen (TYLENOL) 500 MG tablet Take 500-1,000 mg by mouth every 6 (six) hours as needed (for pain.).  Marland Kitchen bisoprolol (ZEBETA) 10 MG tablet Take 1 tablet (10 mg total) by mouth daily.  . cetirizine (ZYRTEC) 10 MG tablet Take 10 mg by mouth daily as needed for allergies.   . Cholecalciferol (VITAMIN D3) 50 MCG (2000  UT) capsule Take 2,000 Units by mouth daily.  Marland Kitchen ELIQUIS 5 MG TABS tablet TAKE 1 TABLET TWICE A DAY (Patient taking differently: Take 5 mg by mouth 2 (two) times daily. (0800 & 2000))  . glipiZIDE (GLUCOTROL) 10 MG tablet Take 10 mg by mouth daily.   . GUAIFENESIN 1200 PO Take 1,200 mg by mouth 2 (two) times daily as needed (sinus congestion.).  Marland Kitchen iron polysaccharides (NU-IRON) 150 MG capsule Take 1 capsule (150 mg total) by mouth daily. (Patient taking differently: Take 150 mg by mouth every evening.)  . levothyroxine (SYNTHROID) 112 MCG tablet Take 112 mcg by mouth  daily before breakfast.   . melatonin 5 MG TABS Take 2.5-5 mg by mouth at bedtime as needed (sleep).   . metFORMIN (GLUCOPHAGE-XR) 500 MG 24 hr tablet Take 1,000 mg by mouth daily with supper.  . Multiple Vitamins-Minerals (MULTIVITAMIN WITH MINERALS) tablet Take 1 tablet by mouth daily.  Marland Kitchen omeprazole (PRILOSEC) 20 MG capsule Take 20 mg by mouth daily as needed (acid reflux/indigestion.).  Marland Kitchen PEG-KCl-NaCl-NaSulf-Na Asc-C (PLENVU) 140 g SOLR Take 1 kit by mouth as directed.  . simvastatin (ZOCOR) 40 MG tablet Take 20 mg by mouth daily with supper.   . triamcinolone cream (KENALOG) 0.1 % Apply 1 application topically 2 (two) times daily as needed (skin irritation.).   . [DISCONTINUED] diltiazem (CARDIZEM CD) 240 MG 24 hr capsule Take 1 capsule (240 mg total) by mouth daily.     Allergies:   Patient has no known allergies.   Social History   Socioeconomic History  . Marital status: Widowed    Spouse name: Not on file  . Number of children: 2  . Years of education: Not on file  . Highest education level: Not on file  Occupational History  . Occupation: retired  Tobacco Use  . Smoking status: Former Smoker    Packs/day: 1.50    Years: 60.00    Pack years: 90.00    Types: Cigarettes    Quit date: 11/03/2011    Years since quitting: 8.3  . Smokeless tobacco: Never Used  Vaping Use  . Vaping Use: Never used  Substance and Sexual Activity  . Alcohol use: No  . Drug use: No  . Sexual activity: Not Currently  Other Topics Concern  . Not on file  Social History Narrative  . Not on file   Social Determinants of Health   Financial Resource Strain: Not on file  Food Insecurity: Not on file  Transportation Needs: Not on file  Physical Activity: Not on file  Stress: Not on file  Social Connections: Not on file     Family History: The patient's family history includes Arrhythmia in her mother and sister; Cancer in her sister; Diabetes in her mother; Esophageal cancer (age of onset:  52) in her brother; Heart attack in her father; Heart disease in her mother; Hypertension in her brother, brother, and sister; Stomach cancer (age of onset: 28) in her brother; Stroke in her brother, father, mother, and sister; Thyroid disease in her mother. There is no history of Colon polyps, Colon cancer, or Rectal cancer.  ROS:   Please see the history of present illness.    All other systems reviewed and are negative.  EKGs/Labs/Other Studies Reviewed:    The following studies were reviewed today: Prior notes, hospital records outside lab work  Outside lab work from January 30, 2020 shows a hemoglobin of 14.8  EKG:  The ekg ordered today demonstrates atrial fibrillation  with a ventricular rate of 73 bpm  December 09, 2019 echo personally reviewed Left ventricular function normal, 60% Right ventricular function normal No significant valvular abnormalities   Recent Labs: 11/05/2019: Magnesium 2.2 12/07/2019: ALT 20 12/08/2019: TSH 3.408 12/09/2019: B Natriuretic Peptide 714.3 12/13/2019: BUN 22; Creatinine, Ser 1.23; Hemoglobin 11.3; Platelets 391; Potassium 3.6; Sodium 137  Recent Lipid Panel No results found for: CHOL, TRIG, HDL, CHOLHDL, VLDL, LDLCALC, LDLDIRECT  Physical Exam:    VS:  BP (!) 148/68   Pulse 73   Ht '5\' 6"'  (1.676 m)   Wt 220 lb (99.8 kg)   SpO2 94%   BMI 35.51 kg/m     Wt Readings from Last 3 Encounters:  02/28/20 220 lb (99.8 kg)  01/09/20 216 lb 14.9 oz (98.4 kg)  01/04/20 221 lb (100.2 kg)     GEN:  Well nourished, well developed in no acute distress HEENT: Normal NECK: No JVD; No carotid bruits LYMPHATICS: No lymphadenopathy CARDIAC: RRR, no murmurs, rubs, gallops RESPIRATORY:  Clear to auscultation without rales, wheezing or rhonchi  ABDOMEN: Soft, non-tender, non-distended MUSCULOSKELETAL:  No edema; No deformity  SKIN: Warm and dry NEUROLOGIC:  Alert and oriented x 3 PSYCHIATRIC:  Normal affect   ASSESSMENT:    1. Persistent atrial  fibrillation ([place])   2. SOB (shortness of breath)   3. Secondary hypercoagulable state ([place])    PLAN:    In order of problems listed above:  1. Persistent atrial fibrillation Patient with mildly symptomatic persistent atrial fibrillation.  Her A. fib is well rate controlled with a ventricular rate of 73 bpm during today's visit.  Previously was taking dofetilide with good rhythm control but this had to be stopped in the setting of a UTI with labile renal function.  We discussed management options for her atrial fibrillation during today's visit including continued rate control, rhythm control using antiarrhythmics.  She is not a good ablation candidate.  After our discussion, we elected to pursue a rhythm control strategy using amiodarone.  Amiodarone was discussed in detail with the patient and her daughter who is with her today.  These discussions including the risk and the need for ongoing monitoring while taking amiodarone.  We will plan to start amiodarone 400 mg twice daily for 5 days followed by 200 mg once a day.  We will plan to see her back in approximately 6 to 8 weeks with a complete metabolic panel and thyroid function studies at that visit.  If she remains in atrial fibrillation at that appointment after completing the load of amiodarone, will plan for cardioversion.  In the meantime, she will continue Eliquis 5 mg twice a day for stroke prophylaxis.   Medication Adjustments/Labs and Tests Ordered: Current medicines are reviewed at length with the patient today.  Concerns regarding medicines are outlined above.  Orders Placed This Encounter  Procedures  . EKG 12-Lead   Meds ordered this encounter  Medications  . amiodarone (PACERONE) 200 MG tablet    Sig: Take 2 tablets (400 mg total) by mouth 2 (two) times daily for 5 days, THEN 1 tablet (200 mg total) daily.    Dispense:  90 tablet    Refill:  3     Signed, Lars Mage, MD, [facility]  02/28/2020 9:40 PM     Electrophysiology [facility]

## 2020-03-14 ENCOUNTER — Ambulatory Visit (INDEPENDENT_AMBULATORY_CARE_PROVIDER_SITE_OTHER): Payer: Medicare Other | Admitting: Cardiology

## 2020-03-14 ENCOUNTER — Encounter: Payer: Self-pay | Admitting: Cardiology

## 2020-03-14 VITALS — BP 150/80 | HR 79 | Resp 16 | Ht 65.0 in | Wt 220.4 lb

## 2020-03-14 DIAGNOSIS — I10 Essential (primary) hypertension: Secondary | ICD-10-CM | POA: Diagnosis not present

## 2020-03-14 DIAGNOSIS — I4819 Other persistent atrial fibrillation: Secondary | ICD-10-CM

## 2020-03-14 NOTE — Patient Instructions (Signed)
Medication Instructions:  Your physician recommends that you continue on your current medications as directed. Please refer to the Current Medication list given to you today.  *If you need a refill on your cardiac medications before your next appointment, please call your pharmacy*   Lab Work: None today If you have labs (blood work) drawn today and your tests are completely normal, you will receive your results only by: Marland Kitchen MyChart Message (if you have MyChart) OR . A paper copy in the mail If you have any lab test that is abnormal or we need to change your treatment, we will call you to review the results.   Testing/Procedures: None today   Follow-Up: At [facility], you and your health needs are our priority.  As part of our continuing mission to provide you with exceptional heart care, we have created designated Provider Care Teams.  These Care Teams include your primary Cardiologist (physician) and Advanced Practice Providers (APPs -  Physician Assistants and Nurse Practitioners) who all work together to provide you with the care you need, when you need it.  We recommend signing up for the patient portal called "MyChart".  Sign up information is provided on this After Visit Summary.  MyChart is used to connect with patients for Virtual Visits (Telemedicine).  Patients are able to view lab/test results, encounter notes, upcoming appointments, etc.  Non-urgent messages can be sent to your provider as well.   To learn more about what you can do with MyChart, go to [email or web address].    Your next appointment:   6 month(s)  The format for your next appointment:   In Person  Provider:   Dina Rich, MD   Other Instructions Call Friday with home BP readings

## 2020-03-14 NOTE — Progress Notes (Signed)
Clinical Summary [name] is a 85 y.o.female seen today for follow up of the following medical problems.  1. Persistent afib - afib noted by 01/26/2019 preop ekg - no recent palpitations. Has intermittent SOB/DOE at times, some days worst then other. - recent monitor showed rate controlled afib/aflutter  - issues with afib with RVR during 10/2019 clinic visit, referred to [place] clinic - admitted 10/24/19 for tikosyn initiation. Loaded with attempted cardioversion 10/27/19 but failed - at EP f/u 8/26 plans were for repeat cardioversion, if fails would consider amiodarone (minimal COPD according to pulm note). Appears admitted with anemia prior to cardioversion, EKG during that admission 11/04/19 showed NSR - dilt stopped 10/27/19 after tikosyn loading, remained on bisoprolol   - admission 12/2019 with COPD exacerbation and UTI, issues with afib with RVR.  - tikosyn was stopped due to labile renal function, Cr up to 1.9 on admission   - seen by EP. Poor ablation candidate. Started on amio that visit, plans for f/u 6-8 weeks and possibly consider DCCV.  - no recent palpitations.   2. Anemia - admitted 10/2019 with symptomatic anemia. Hgb down to 6.5, given 1 unit prbcs - has not been a recurrent issue   3. Hard of hearing   Has has moderna covid vaccine x 3  Past Medical History:  Diagnosis Date  . Allergy    seasonal allergies  . Anemia    IDA- on iron supplement  . Anxiety   . Atrial fibrillation ([place])   . Blood transfusion without reported diagnosis    2021- due to IDA/ low hemo (7.3)  . Cancer ([place]) 1976   bilaterl thyroid ca  . Diabetes mellitus without complication ([place])    on meds  . GERD (gastroesophageal reflux disease)    on meds---OTC  . Headache(784.0)    hx migraines  . Hyperlipidemia    on meds  . Hypertension    on meds  . Hypothyroidism    thyroidectomy in 1976- on meds at  this time  . On supplemental oxygen therapy    2 L at night  .  Shortness of breath   . Sinus congestion   . Sleep apnea    study done in [place], [place], use CPAP----with home O2 concentrater at night with CPAP     No Known Allergies   Current Outpatient Medications  Medication Sig Dispense Refill  . acetaminophen (TYLENOL) 500 MG tablet Take 500-1,000 mg by mouth every 6 (six) hours as needed (for pain.).    Marland Kitchen amiodarone (PACERONE) 200 MG tablet Take 2 tablets (400 mg total) by mouth 2 (two) times daily for 5 days, THEN 1 tablet (200 mg total) daily. 90 tablet 3  . bisoprolol (ZEBETA) 10 MG tablet Take 1 tablet (10 mg total) by mouth daily. 30 tablet 6  . cetirizine (ZYRTEC) 10 MG tablet Take 10 mg by mouth daily as needed for allergies.     . Cholecalciferol (VITAMIN D3) 50 MCG (2000 UT) capsule Take 2,000 Units by mouth daily.    Marland Kitchen ELIQUIS 5 MG TABS tablet TAKE 1 TABLET TWICE A DAY (Patient taking differently: Take 5 mg by mouth 2 (two) times daily. (0800 & 2000)) 180 tablet 3  . glipiZIDE (GLUCOTROL) 10 MG tablet Take 10 mg by mouth daily.     . GUAIFENESIN 1200 PO Take 1,200 mg by mouth 2 (two) times daily as needed (sinus congestion.).    Marland Kitchen iron polysaccharides (NU-IRON) 150 MG capsule Take 1  capsule (150 mg total) by mouth daily. (Patient taking differently: Take 150 mg by mouth every evening.) 30 capsule 0  . levothyroxine (SYNTHROID) 112 MCG tablet Take 112 mcg by mouth daily before breakfast.     . melatonin 5 MG TABS Take 2.5-5 mg by mouth at bedtime as needed (sleep).     . metFORMIN (GLUCOPHAGE-XR) 500 MG 24 hr tablet Take 1,000 mg by mouth daily with supper.    . Multiple Vitamins-Minerals (MULTIVITAMIN WITH MINERALS) tablet Take 1 tablet by mouth daily.    Marland Kitchen omeprazole (PRILOSEC) 20 MG capsule Take 20 mg by mouth daily as needed (acid reflux/indigestion.).    Marland Kitchen PEG-KCl-NaCl-NaSulf-Na Asc-C (PLENVU) 140 g SOLR Take 1 kit by mouth as directed. 1 each 0  . simvastatin (ZOCOR) 40 MG tablet Take 20 mg by mouth daily with supper.     .  triamcinolone cream (KENALOG) 0.1 % Apply 1 application topically 2 (two) times daily as needed (skin irritation.).      No current facility-administered medications for this visit.     Past Surgical History:  Procedure Laterality Date  . APPENDECTOMY    . CARDIOVERSION N/A 10/27/2019   Procedure: CARDIOVERSION;  Surgeon: [name], MD;  Location: [facility] ENDOSCOPY;  Service: Cardiovascular;  Laterality: N/A;  . CHOLECYSTECTOMY    . COLONOSCOPY WITH PROPOFOL N/A 01/09/2020   Procedure: COLONOSCOPY WITH PROPOFOL;  Surgeon: Doran Stabler, MD;  Location: [facility] ENDOSCOPY;  Service: Gastroenterology;  Laterality: N/A;  . ESOPHAGOGASTRODUODENOSCOPY (EGD) WITH PROPOFOL N/A 01/09/2020   Procedure: ESOPHAGOGASTRODUODENOSCOPY (EGD) WITH PROPOFOL;  Surgeon: Doran Stabler, MD;  [facility] ENDOSCOPY;  Service: Gastroenterology;  Laterality: N/A;  . HEMOSTASIS CLIP PLACEMENT  01/09/2020   Procedure: HEMOSTASIS CLIP PLACEMENT;  Surgeon: Doran Stabler, MD;  Location: [facility] ENDOSCOPY;  Service: Gastroenterology;;  . OPEN REDUCTION INTERNAL FIXATION (ORIF) TIBIA/FIBULA FRACTURE Right 11/03/2013   Procedure: OPEN REDUCTION INTERNAL FIXATION (ORIF) RIGHT TIBIA/FIBULA PILON FRACTURE;  Surgeon: Wylene Simmer, MD;  Location: [facility];  Service: Orthopedics;  Laterality: Right;  . OVARIAN CYST SURGERY Left    age 47  . POLYPECTOMY  01/09/2020   Procedure: POLYPECTOMY;  Surgeon: Doran Stabler, MD;  Location: [facility] END[facility];  Service: Gastroenterology;;  . THYROIDECTOMY Bilateral 1976  . TUBAL LIGATION       No Known Allergies    Family History  Problem Relation Age of Onset  . Stroke Mother        brain hemmorhage  . Diabetes Mother   . Heart disease Mother   . Thyroid disease Mother   . Arrhythmia Mother   . Stroke Father   . Heart attack Father        blood clot in brain  . Stroke Sister   . Cancer Sister   . Stomach cancer Brother 72  . Hypertension Brother   . Esophageal cancer  Brother 49  . Stroke Brother   . Hypertension Brother   . Arrhythmia Sister   . Hypertension Sister   . Colon polyps Neg Hx   . Colon cancer Neg Hx   . Rectal cancer Neg Hx      Social History [name] reports that she quit smoking about 8 years ago. Her smoking use included cigarettes. She has a 90.00 pack-year smoking history. She has never used smokeless tobacco. [name] reports no history of alcohol use.   Review of Systems CONSTITUTIONAL: No weight loss, fever, chills, weakness or fatigue.  HEENT: Eyes:  No visual loss, blurred vision, double vision or yellow sclerae.No hearing loss, sneezing, congestion, runny nose or sore throat.  SKIN: No rash or itching.  CARDIOVASCULAR: per hpi RESPIRATORY: No shortness of breath, cough or sputum.  GASTROINTESTINAL: No anorexia, nausea, vomiting or diarrhea. No abdominal pain or blood.  GENITOURINARY: No burning on urination, no polyuria NEUROLOGICAL: No headache, dizziness, syncope, paralysis, ataxia, numbness or tingling in the extremities. No change in bowel or bladder control.  MUSCULOSKELETAL: No muscle, back pain, joint pain or stiffness.  LYMPHATICS: No enlarged nodes. No history of splenectomy.  PSYCHIATRIC: No history of depression or anxiety.  ENDOCRINOLOGIC: No reports of sweating, cold or heat intolerance. No polyuria or polydipsia.  Marland Kitchen   Physical Examination Today's Vitals   03/14/20 1145  BP: (!) 150/80  Pulse: 79  Resp: 16  SpO2: 98%  Weight: 220 lb 6.4 oz (100 kg)  Height: '5\' 5"'  (1.651 m)   Body mass index is 36.68 kg/m.  Gen: resting comfortably, no acute distress HEENT: no scleral icterus, pupils equal round and reactive, no palptable cervical adenopathy,  CV: irreg, no m/r/g, no jvd Resp: Clear to auscultation bilaterally GI: abdomen is soft, non-tender, non-distended, normal bowel sounds, no hepatosplenomegaly MSK: extremities are warm, no edema.  Skin: warm, no rash Neuro:  no focal  deficits Psych: appropriate affect   Diagnostic Studies 02/2019 echo IMPRESSIONS    1. Left ventricular ejection fraction, by visual estimation, is 70 to  75%. The left ventricle has hyperdynamic function. There is no left  ventricular hypertrophy.  2. Left ventricular diastolic parameters are indeterminate.  3. The left ventricle has no regional wall motion abnormalities.  4. Global right ventricle has normal systolic function.The right  ventricular size is normal. No increase in right ventricular wall  thickness.  5. Left atrial size was normal.  6. Right atrial size was normal.  7. The mitral valve is normal in structure. Trivial mitral valve  regurgitation. No evidence of mitral stenosis.  8. The tricuspid valve is normal in structure. Tricuspid valve  regurgitation is not demonstrated.  9. The aortic valve has an indeterminant number of cusps. Aortic valve  regurgitation is not visualized. Mild aortic valve sclerosis without  stenosis.  10. The pulmonic valve was not well visualized. Pulmonic valve  regurgitation is not visualized.  11. Normal pulmonary artery systolic pressure.     Assessment and Plan   1. Persistent afib - off tikosyn after recent admission with AKI - recently started on amio by EP, still in process of oral load. EKG today shows rate controlled afib - continue current meds, she is on eliquis for stroke prevention  2. HTN - above goal, will call end of week with home bp's. Would consider norvasc if additional agent needed.     Arnoldo Lenis, M.D.,

## 2020-03-16 ENCOUNTER — Telehealth: Payer: Self-pay | Admitting: Cardiology

## 2020-03-16 MED ORDER — AMLODIPINE BESYLATE 5 MG PO TABS: 5.0000 mg | ORAL_TABLET | Freq: Every day | ORAL | 3 refills | Status: DC

## 2020-03-16 NOTE — Telephone Encounter (Signed)
Please start norvasc 5mg  daily.  Zandra Abts MD

## 2020-03-16 NOTE — Telephone Encounter (Signed)
Forward to Dr Branch 

## 2020-03-16 NOTE — Telephone Encounter (Signed)
Received call from daughter Santiago Glad in regards to patient's BP readings. States that Dr. Harl Bowie ask to call in readings.  03/15/2020  146/67 03/15/2020   145/74 (taken last night) 03/16/2020   161/86  If there is a medication change please send to the [facility]. [place], [place]  Please call [phone]

## 2020-03-16 NOTE — Telephone Encounter (Signed)
I spoke with daughter, Santiago Glad. E-scribed Amlodipine 5 mg daily to [facility].

## 2020-03-26 ENCOUNTER — Other Ambulatory Visit: Payer: Self-pay | Admitting: *Deleted

## 2020-03-26 MED ORDER — BISOPROLOL FUMARATE 10 MG PO TABS
10.0000 mg | ORAL_TABLET | Freq: Every day | ORAL | 3 refills | Status: DC
Start: 2020-03-26 — End: 2021-02-13

## 2020-03-28 DIAGNOSIS — E7849 Other hyperlipidemia: Secondary | ICD-10-CM | POA: Diagnosis not present

## 2020-03-28 DIAGNOSIS — N183 Chronic kidney disease, stage 3 unspecified: Secondary | ICD-10-CM | POA: Diagnosis not present

## 2020-03-28 DIAGNOSIS — D529 Folate deficiency anemia, unspecified: Secondary | ICD-10-CM | POA: Diagnosis not present

## 2020-03-28 DIAGNOSIS — E782 Mixed hyperlipidemia: Secondary | ICD-10-CM | POA: Diagnosis not present

## 2020-03-28 DIAGNOSIS — D500 Iron deficiency anemia secondary to blood loss (chronic): Secondary | ICD-10-CM | POA: Diagnosis not present

## 2020-03-28 DIAGNOSIS — E039 Hypothyroidism, unspecified: Secondary | ICD-10-CM | POA: Diagnosis not present

## 2020-03-28 DIAGNOSIS — D519 Vitamin B12 deficiency anemia, unspecified: Secondary | ICD-10-CM | POA: Diagnosis not present

## 2020-03-28 DIAGNOSIS — K7581 Nonalcoholic steatohepatitis (NASH): Secondary | ICD-10-CM | POA: Diagnosis not present

## 2020-03-28 DIAGNOSIS — E1122 Type 2 diabetes mellitus with diabetic chronic kidney disease: Secondary | ICD-10-CM | POA: Diagnosis not present

## 2020-04-03 DIAGNOSIS — J449 Chronic obstructive pulmonary disease, unspecified: Secondary | ICD-10-CM | POA: Diagnosis not present

## 2020-04-03 DIAGNOSIS — E7849 Other hyperlipidemia: Secondary | ICD-10-CM | POA: Diagnosis not present

## 2020-04-03 DIAGNOSIS — J9611 Chronic respiratory failure with hypoxia: Secondary | ICD-10-CM | POA: Diagnosis not present

## 2020-04-03 DIAGNOSIS — K7581 Nonalcoholic steatohepatitis (NASH): Secondary | ICD-10-CM | POA: Diagnosis not present

## 2020-04-03 DIAGNOSIS — I10 Essential (primary) hypertension: Secondary | ICD-10-CM | POA: Diagnosis not present

## 2020-04-03 DIAGNOSIS — E1122 Type 2 diabetes mellitus with diabetic chronic kidney disease: Secondary | ICD-10-CM | POA: Diagnosis not present

## 2020-04-03 DIAGNOSIS — I4891 Unspecified atrial fibrillation: Secondary | ICD-10-CM | POA: Diagnosis not present

## 2020-04-03 DIAGNOSIS — Z23 Encounter for immunization: Secondary | ICD-10-CM | POA: Diagnosis not present

## 2020-04-14 DIAGNOSIS — N390 Urinary tract infection, site not specified: Secondary | ICD-10-CM | POA: Diagnosis not present

## 2020-04-14 DIAGNOSIS — H1033 Unspecified acute conjunctivitis, bilateral: Secondary | ICD-10-CM | POA: Diagnosis not present

## 2020-04-17 ENCOUNTER — Other Ambulatory Visit: Payer: Self-pay

## 2020-04-17 ENCOUNTER — Encounter: Payer: Self-pay | Admitting: Cardiology

## 2020-04-17 ENCOUNTER — Ambulatory Visit (INDEPENDENT_AMBULATORY_CARE_PROVIDER_SITE_OTHER): Payer: Medicare Other | Admitting: Cardiology

## 2020-04-17 VITALS — BP 146/82 | HR 89 | Ht 65.0 in | Wt 215.2 lb

## 2020-04-17 DIAGNOSIS — I4819 Other persistent atrial fibrillation: Secondary | ICD-10-CM | POA: Diagnosis not present

## 2020-04-17 DIAGNOSIS — I484 Atypical atrial flutter: Secondary | ICD-10-CM | POA: Diagnosis not present

## 2020-04-17 DIAGNOSIS — Z01812 Encounter for preprocedural laboratory examination: Secondary | ICD-10-CM

## 2020-04-17 DIAGNOSIS — Z79899 Other long term (current) drug therapy: Secondary | ICD-10-CM | POA: Diagnosis not present

## 2020-04-17 MED ORDER — AMLODIPINE BESYLATE 5 MG PO TABS: 10.0000 mg | ORAL_TABLET | Freq: Every day | ORAL | 3 refills | Status: DC

## 2020-04-17 MED ORDER — AMLODIPINE BESYLATE 10 MG PO TABS: 10.0000 mg | ORAL_TABLET | Freq: Every day | ORAL | 3 refills | Status: DC

## 2020-04-17 NOTE — Addendum Note (Signed)
Addended by: Thora Lance on: 04/17/2020 02:59 PM   Modules accepted: Orders

## 2020-04-17 NOTE — H&P (View-Only) (Signed)
Electrophysiology Office Follow up Visit Note:    Date:  04/17/2020   ID:  Miranda Wilkinson, DOB 12-May-1935, MRN [id number]  PCP:  Caryl Bis, MD  [place] Cardiologist:  Carlyle Dolly, MD  [facility] Electrophysiologist:  Vickie Epley, MD    Interval History:    Miranda Wilkinson is a 85 y.o. female who presents for a follow up visit. They were last seen in clinic February 28, 2020.  At that appointment we started amiodarone and she is tolerated this medication well.  She is not missed any doses of her Eliquis.  Unfortunately she does have conjunctivitis and is taking eyedrops for this.   Past Medical History:  Diagnosis Date  . Allergy    seasonal allergies  . Anemia    IDA- on iron supplement  . Anxiety   . Atrial fibrillation ([place])   . Blood transfusion without reported diagnosis    2021- due to IDA/ low hemo (7.3)  . Cancer ([place]) 1976   bilaterl thyroid ca  . Diabetes mellitus without complication ([place])    on meds  . GERD (gastroesophageal reflux disease)    on meds---OTC  . Headache(784.0)    hx migraines  . Hyperlipidemia    on meds  . Hypertension    on meds  . Hypothyroidism    thyroidectomy in 1976- on meds at  this time  . On supplemental oxygen therapy    2 L at night  . Shortness of breath   . Sinus congestion   . Sleep apnea    study done in [place], [place], use CPAP----with home O2 concentrater at night with CPAP    Past Surgical History:  Procedure Laterality Date  . APPENDECTOMY    . CARDIOVERSION N/A 10/27/2019   Procedure: CARDIOVERSION;  Surgeon: [name], MD;  Location: [facility] ENDOSCOPY;  Service: Cardiovascular;  Laterality: N/A;  . CHOLECYSTECTOMY    . COLONOSCOPY WITH PROPOFOL N/A 01/09/2020   Procedure: COLONOSCOPY WITH PROPOFOL;  Surgeon: Doran Stabler, MD;  Location: [facility] ENDOSCOPY;  Service: Gastroenterology;  Laterality: N/A;  . ESOPHAGOGASTRODUODENOSCOPY (EGD) WITH PROPOFOL N/A 01/09/2020   Procedure:  ESOPHAGOGASTRODUODENOSCOPY (EGD) WITH PROPOFOL;  Surgeon: Doran Stabler, MD;  Location: [facility] ENDOSCOPY;  Service: Gastroenterology;  Laterality: N/A;  . HEMOSTASIS CLIP PLACEMENT  01/09/2020   Procedure: HEMOSTASIS CLIP PLACEMENT;  Surgeon: Doran Stabler, MD;  Location: [facility] ENDOSCOPY;  Service: Gastroenterology;;  . OPEN REDUCTION INTERNAL FIXATION (ORIF) TIBIA/FIBULA FRACTURE Right 11/03/2013   Procedure: OPEN REDUCTION INTERNAL FIXATION (ORIF) RIGHT TIBIA/FIBULA PILON FRACTURE;  Surgeon: Wylene Simmer, MD;  Location: [place];  Service: Orthopedics;  Laterality: Right;  . OVARIAN CYST SURGERY Left    age 35  . POLYPECTOMY  01/09/2020   Procedure: POLYPECTOMY;  Surgeon: Doran Stabler, MD;  Location: [facility] ENDOS[facility];  Service: Gastroenterology;;  . THYROIDECTOMY Bilateral 1976  . TUBAL LIGATION      Current Medications: Current Meds  Medication Sig  . acetaminophen (TYLENOL) 500 MG tablet Take 500-1,000 mg by mouth every 6 (six) hours as needed (for pain.).  Marland Kitchen amiodarone (PACERONE) 200 MG tablet Take 2 tablets (400 mg total) by mouth 2 (two) times daily for 5 days, THEN 1 tablet (200 mg total) daily.  Marland Kitchen amLODipine (NORVASC) 5 MG tablet Take 1 tablet (5 mg total) by mouth daily.  . bisoprolol (ZEBETA) 10 MG tablet Take 1 tablet (10 mg total) by mouth daily.  . cetirizine (ZYRTEC) 10 MG  tablet Take 10 mg by mouth daily as needed for allergies.   . Cholecalciferol (VITAMIN D3) 50 MCG (2000 UT) capsule Take 2,000 Units by mouth daily.  Marland Kitchen ELIQUIS 5 MG TABS tablet TAKE 1 TABLET TWICE A DAY  . glipiZIDE (GLUCOTROL) 10 MG tablet Take 10 mg by mouth daily.   . GUAIFENESIN 1200 PO Take 1,200 mg by mouth 2 (two) times daily as needed (sinus congestion.).  Marland Kitchen iron polysaccharides (NU-IRON) 150 MG capsule Take 1 capsule (150 mg total) by mouth daily.  Marland Kitchen levothyroxine (SYNTHROID) 112 MCG tablet Take 112 mcg by mouth daily before breakfast.   . melatonin 5 MG TABS Take 2.5-5 mg by mouth at bedtime as  needed (sleep).   . metFORMIN (GLUCOPHAGE-XR) 500 MG 24 hr tablet Take 1,000 mg by mouth daily with supper.  . Multiple Vitamins-Minerals (MULTIVITAMIN WITH MINERALS) tablet Take 1 tablet by mouth daily.  Marland Kitchen omeprazole (PRILOSEC) 20 MG capsule Take 20 mg by mouth daily as needed (acid reflux/indigestion.).  Marland Kitchen simvastatin (ZOCOR) 40 MG tablet Take 20 mg by mouth daily with supper.   . triamcinolone cream (KENALOG) 0.1 % Apply 1 application topically 2 (two) times daily as needed (skin irritation.).      Allergies:   Patient has no known allergies.   Social History   Socioeconomic History  . Marital status: Widowed    Spouse name: Not on file  . Number of children: 2  . Years of education: Not on file  . Highest education level: Not on file  Occupational History  . Occupation: retired  Tobacco Use  . Smoking status: Former Smoker    Packs/day: 1.50    Years: 60.00    Pack years: 90.00    Types: Cigarettes    Quit date: 11/03/2011    Years since quitting: 8.4  . Smokeless tobacco: Never Used  Vaping Use  . Vaping Use: Never used  Substance and Sexual Activity  . Alcohol use: No  . Drug use: No  . Sexual activity: Not Currently  Other Topics Concern  . Not on file  Social History Narrative  . Not on file   Social Determinants of Health   Financial Resource Strain: Not on file  Food Insecurity: Not on file  Transportation Needs: Not on file  Physical Activity: Not on file  Stress: Not on file  Social Connections: Not on file     Family History: The patient's family history includes Arrhythmia in her mother and sister; Cancer in her sister; Diabetes in her mother; Esophageal cancer (age of onset: 48) in her brother; Heart attack in her father; Heart disease in her mother; Hypertension in her brother, brother, and sister; Stomach cancer (age of onset: 82) in her brother; Stroke in her brother, father, mother, and sister; Thyroid disease in her mother. There is no history of  Colon polyps, Colon cancer, or Rectal cancer.  ROS:   Please see the history of present illness.    All other systems reviewed and are negative.  EKGs/Labs/Other Studies Reviewed:    The following studies were reviewed today:   EKG:  The ekg ordered today demonstrates atrial flutter with a controlled ventricular rate  Recent Labs: 11/05/2019: Magnesium 2.2 12/07/2019: ALT 20 12/08/2019: TSH 3.408 12/09/2019: B Natriuretic Peptide 714.3 12/13/2019: BUN 22; Creatinine, Ser 1.23; Hemoglobin 11.3; Platelets 391; Potassium 3.6; Sodium 137  Recent Lipid Panel No results found for: CHOL, TRIG, HDL, CHOLHDL, VLDL, LDLCALC, LDLDIRECT  Physical Exam:    VS:  BP (!) 146/82   Pulse 89   Ht 5\' 5"  (1.651 m)   Wt 215 lb 3.2 oz (97.6 kg)   SpO2 96%   BMI 35.81 kg/m     Wt Readings from Last 3 Encounters:  04/17/20 215 lb 3.2 oz (97.6 kg)  03/14/20 220 lb 6.4 oz (100 kg)  02/28/20 220 lb (99.8 kg)     GEN: Well nourished, well developed in no acute distress HEENT: Bilateral injected sclera consistent with conjunctivitis NECK: No JVD; No carotid bruits LYMPHATICS: No lymphadenopathy CARDIAC: RRR, no murmurs, rubs, gallops RESPIRATORY:  Clear to auscultation without rales, wheezing or rhonchi  ABDOMEN: Soft, non-tender, non-distended MUSCULOSKELETAL:  No edema; No deformity  SKIN: Warm and dry NEUROLOGIC:  Alert and oriented x 3 PSYCHIATRIC:  Normal affect   ASSESSMENT:    1. Persistent atrial fibrillation ([place])   2. Atypical atrial flutter (HCC)   3. Encounter for long-term (current) use of high-risk medication    PLAN:    In order of problems listed above:  1. Patient has a history of persistent atrial fibrillation but has organized into an atrial flutter now that she is on amiodarone.  She has not missed any doses of her Eliquis.  We will plan for a cardioversion.  I will plan to see her back in 4 weeks in follow-up.  We will also check a complete metabolic panel, TSH and free  T4 today given her ongoing use of amiodarone.  Follow-up 4 weeks  Medication Adjustments/Labs and Tests Ordered: Current medicines are reviewed at length with the patient today.  Concerns regarding medicines are outlined above.  Orders Placed This Encounter  Procedures  . EKG 12-Lead   No orders of the defined types were placed in this encounter.    Signed, Lars Mage, MD, [facility]  04/17/2020 2:04 PM    Electrophysiology [place]

## 2020-04-17 NOTE — Patient Instructions (Addendum)
Medication Instructions:  Your physician has recommended you make the following change in your medication:   **  Increase your Amlodipine to 10mg  - You may take 2 of your 5mg  tablets until you run out.  *If you need a refill on your cardiac medications before your next appointment, please call your pharmacy*   Lab Work: CBC, CMET, TSH and Free T4  If you have labs (blood work) drawn today and your tests are completely normal, you will receive your results only by: Marland Kitchen MyChart Message (if you have MyChart) OR . A paper copy in the mail If you have any lab test that is abnormal or we need to change your treatment, we will call you to review the results.   Testing/Procedures: Your physician has recommended that you have a Cardioversion (DCCV). Electrical Cardioversion uses a jolt of electricity to your heart either through paddles or wired patches attached to your chest. This is a controlled, usually prescheduled, procedure. Defibrillation is done under light anesthesia in the hospital, and you usually go home the day of the procedure. This is done to get your heart back into a normal rhythm. You are not awake for the procedure. Please see the instruction sheet given to you today.     Follow-Up: At [facility], you and your health needs are our priority.  As part of our continuing mission to provide you with exceptional heart care, we have created designated Provider Care Teams.  These Care Teams include your primary Cardiologist (physician) and Advanced Practice Providers (APPs -  Physician Assistants and Nurse Practitioners) who all work together to provide you with the care you need, when you need it.  We recommend signing up for the patient portal called "MyChart".  Sign up information is provided on this After Visit Summary.  MyChart is used to connect with patients for Virtual Visits (Telemedicine).  Patients are able to view lab/test results, encounter notes, upcoming appointments,  etc.  Non-urgent messages can be sent to your provider as well.   To learn more about what you can do with MyChart, go to [email or web address].    Your next appointment:   4 weeks with Dr Quentin Ore - Dr Claudie Revering scheduler will call you to schedule this appointment   Other Instructions Dear Ms Sheela Stack are scheduled for a Cardioversion  Wednesday 04/25/2020 at 1030am with Dr. Johney Frame.  Please arrive at the [facility] (Main Entrance A) at [facility]: [address] [place], [place] [address] at 930am  DIET: Nothing to eat or drink after midnight except a sip of water with medications (see medication instructions below)  Medication Instructions: Please do not take your metformin the morning of your medications. You may take your other morning medications with a sip of water.  Continue your anticoagulant: Eliquis.  Do not miss any doses of your Eliquis.  You will need to continue your anticoagulant after your procedure until you  are told by your Provider that it is safe to stop   Labs: Labs today  Your Covid screening has been scheduled for 04/23/2020 at 135pm at [place]. [place], [place]  This is a drive thru testing site.  Please stay in your car and you will be directed tho the appropriate testing line.    You must have a responsible person to drive you home and stay in the waiting area during your procedure. Failure to do so could result in cancellation.  Bring your insurance cards.  *Special Note:  Every effort is made to have your procedure done on time. Occasionally there are emergencies that occur at the hospital that may cause delays. Please be patient if a delay does occur.

## 2020-04-17 NOTE — Progress Notes (Signed)
Electrophysiology Office Follow up Visit Note:    Date:  04/17/2020   ID:  Miranda Wilkinson, DOB Mar 08, 1936, MRN [id number]  PCP:  Caryl Bis, MD  [place] Cardiologist:  Carlyle Dolly, MD  [facility] Electrophysiologist:  Vickie Epley, MD    Interval History:    Miranda Wilkinson is a 85 y.o. female who presents for a follow up visit. They were last seen in clinic February 28, 2020.  At that appointment we started amiodarone and she is tolerated this medication well.  She is not missed any doses of her Eliquis.  Unfortunately she does have conjunctivitis and is taking eyedrops for this.   Past Medical History:  Diagnosis Date  . Allergy    seasonal allergies  . Anemia    IDA- on iron supplement  . Anxiety   . Atrial fibrillation ([place])   . Blood transfusion without reported diagnosis    2021- due to IDA/ low hemo (7.3)  . Cancer ([place]) 1976   bilaterl thyroid ca  . Diabetes mellitus without complication ([place])    on meds  . GERD (gastroesophageal reflux disease)    on meds---OTC  . Headache(784.0)    hx migraines  . Hyperlipidemia    on meds  . Hypertension    on meds  . Hypothyroidism    thyroidectomy in 1976- on meds at  this time  . On supplemental oxygen therapy    2 L at night  . Shortness of breath   . Sinus congestion   . Sleep apnea    study done in [place], [place], use CPAP----with home O2 concentrater at night with CPAP    Past Surgical History:  Procedure Laterality Date  . APPENDECTOMY    . CARDIOVERSION N/A 10/27/2019   Procedure: CARDIOVERSION;  Surgeon: [name], MD;  Location: [facility] ENDOSCOPY;  Service: Cardiovascular;  Laterality: N/A;  . CHOLECYSTECTOMY    . COLONOSCOPY WITH PROPOFOL N/A 01/09/2020   Procedure: COLONOSCOPY WITH PROPOFOL;  Surgeon: Doran Stabler, MD;  [facility]: [facility] ENDOSCOPY;  Service: Gastroenterology;  Laterality: N/A;  . ESOPHAGOGASTRODUODENOSCOPY (EGD) WITH PROPOFOL N/A 01/09/2020   Procedure:  ESOPHAGOGASTRODUODENOSCOPY (EGD) WITH PROPOFOL;  Surgeon: Doran Stabler, MD;  Location: [facility] ENDOSCOPY;  Service: Gastroenterology;  Laterality: N/A;  . HEMOSTASIS CLIP PLACEMENT  01/09/2020   Procedure: HEMOSTASIS CLIP PLACEMENT;  Surgeon: Doran Stabler, MD;  Location: [facility] ENDOSCOPY;  Service: Gastroenterology;;  . OPEN REDUCTION INTERNAL FIXATION (ORIF) TIBIA/FIBULA FRACTURE Right 11/03/2013   Procedure: OPEN REDUCTION INTERNAL FIXATION (ORIF) RIGHT TIBIA/FIBULA PILON FRACTURE;  Surgeon: Wylene Simmer, MD;  Location: [place];  Service: Orthopedics;  Laterality: Right;  . OVARIAN CYST SURGERY Left    age 38  . POLYPECTOMY  01/09/2020   Procedure: POLYPECTOMY;  Surgeon: Doran Stabler, MD;  Location: [facility] ENDOS[facility];  Service: Gastroenterology;;  . THYROIDECTOMY Bilateral 1976  . TUBAL LIGATION      Current Medications: Current Meds  Medication Sig  . acetaminophen (TYLENOL) 500 MG tablet Take 500-1,000 mg by mouth every 6 (six) hours as needed (for pain.).  Marland Kitchen amiodarone (PACERONE) 200 MG tablet Take 2 tablets (400 mg total) by mouth 2 (two) times daily for 5 days, THEN 1 tablet (200 mg total) daily.  Marland Kitchen amLODipine (NORVASC) 5 MG tablet Take 1 tablet (5 mg total) by mouth daily.  . bisoprolol (ZEBETA) 10 MG tablet Take 1 tablet (10 mg total) by mouth daily.  . cetirizine (ZYRTEC) 10 MG  tablet Take 10 mg by mouth daily as needed for allergies.   . Cholecalciferol (VITAMIN D3) 50 MCG (2000 UT) capsule Take 2,000 Units by mouth daily.  Marland Kitchen ELIQUIS 5 MG TABS tablet TAKE 1 TABLET TWICE A DAY  . glipiZIDE (GLUCOTROL) 10 MG tablet Take 10 mg by mouth daily.   . GUAIFENESIN 1200 PO Take 1,200 mg by mouth 2 (two) times daily as needed (sinus congestion.).  Marland Kitchen iron polysaccharides (NU-IRON) 150 MG capsule Take 1 capsule (150 mg total) by mouth daily.  Marland Kitchen levothyroxine (SYNTHROID) 112 MCG tablet Take 112 mcg by mouth daily before breakfast.   . melatonin 5 MG TABS Take 2.5-5 mg by mouth at bedtime as  needed (sleep).   . metFORMIN (GLUCOPHAGE-XR) 500 MG 24 hr tablet Take 1,000 mg by mouth daily with supper.  . Multiple Vitamins-Minerals (MULTIVITAMIN WITH MINERALS) tablet Take 1 tablet by mouth daily.  Marland Kitchen omeprazole (PRILOSEC) 20 MG capsule Take 20 mg by mouth daily as needed (acid reflux/indigestion.).  Marland Kitchen simvastatin (ZOCOR) 40 MG tablet Take 20 mg by mouth daily with supper.   . triamcinolone cream (KENALOG) 0.1 % Apply 1 application topically 2 (two) times daily as needed (skin irritation.).      Allergies:   Patient has no known allergies.   Social History   Socioeconomic History  . Marital status: Widowed    Spouse name: Not on file  . Number of children: 2  . Years of education: Not on file  . Highest education level: Not on file  Occupational History  . Occupation: retired  Tobacco Use  . Smoking status: Former Smoker    Packs/day: 1.50    Years: 60.00    Pack years: 90.00    Types: Cigarettes    Quit date: 11/03/2011    Years since quitting: 8.4  . Smokeless tobacco: Never Used  Vaping Use  . Vaping Use: Never used  Substance and Sexual Activity  . Alcohol use: No  . Drug use: No  . Sexual activity: Not Currently  Other Topics Concern  . Not on file  Social History Narrative  . Not on file   Social Determinants of Health   Financial Resource Strain: Not on file  Food Insecurity: Not on file  Transportation Needs: Not on file  Physical Activity: Not on file  Stress: Not on file  Social Connections: Not on file     Family History: The patient's family history includes Arrhythmia in her mother and sister; Cancer in her sister; Diabetes in her mother; Esophageal cancer (age of onset: 53) in her brother; Heart attack in her father; Heart disease in her mother; Hypertension in her brother, brother, and sister; Stomach cancer (age of onset: 39) in her brother; Stroke in her brother, father, mother, and sister; Thyroid disease in her mother. There is no history of  Colon polyps, Colon cancer, or Rectal cancer.  ROS:   Please see the history of present illness.    All other systems reviewed and are negative.  EKGs/Labs/Other Studies Reviewed:    The following studies were reviewed today:   EKG:  The ekg ordered today demonstrates atrial flutter with a controlled ventricular rate  Recent Labs: 11/05/2019: Magnesium 2.2 12/07/2019: ALT 20 12/08/2019: TSH 3.408 12/09/2019: B Natriuretic Peptide 714.3 12/13/2019: BUN 22; Creatinine, Ser 1.23; Hemoglobin 11.3; Platelets 391; Potassium 3.6; Sodium 137  Recent Lipid Panel No results found for: CHOL, TRIG, HDL, CHOLHDL, VLDL, LDLCALC, LDLDIRECT  Physical Exam:    VS:  BP (!) 146/82   Pulse 89   Ht 5\' 5"  (1.651 m)   Wt 215 lb 3.2 oz (97.6 kg)   SpO2 96%   BMI 35.81 kg/m     Wt Readings from Last 3 Encounters:  04/17/20 215 lb 3.2 oz (97.6 kg)  03/14/20 220 lb 6.4 oz (100 kg)  02/28/20 220 lb (99.8 kg)     GEN: Well nourished, well developed in no acute distress HEENT: Bilateral injected sclera consistent with conjunctivitis NECK: No JVD; No carotid bruits LYMPHATICS: No lymphadenopathy CARDIAC: RRR, no murmurs, rubs, gallops RESPIRATORY:  Clear to auscultation without rales, wheezing or rhonchi  ABDOMEN: Soft, non-tender, non-distended MUSCULOSKELETAL:  No edema; No deformity  SKIN: Warm and dry NEUROLOGIC:  Alert and oriented x 3 PSYCHIATRIC:  Normal affect   ASSESSMENT:    1. Persistent atrial fibrillation ([place])   2. Atypical atrial flutter (HCC)   3. Encounter for long-term (current) use of high-risk medication    PLAN:    In order of problems listed above:  1. Patient has a history of persistent atrial fibrillation but has organized into an atrial flutter now that she is on amiodarone.  She has not missed any doses of her Eliquis.  We will plan for a cardioversion.  I will plan to see her back in 4 weeks in follow-up.  We will also check a complete metabolic panel, TSH and free  T4 today given her ongoing use of amiodarone.  Follow-up 4 weeks  Medication Adjustments/Labs and Tests Ordered: Current medicines are reviewed at length with the patient today.  Concerns regarding medicines are outlined above.  Orders Placed This Encounter  Procedures  . EKG 12-Lead   No orders of the defined types were placed in this encounter.    Signed, Lars Mage, MD, [facility]  04/17/2020 2:04 PM    Electrophysiology [place]

## 2020-04-18 LAB — COMPREHENSIVE METABOLIC PANEL
ALT: 18 IU/L (ref 0–32)
AST: 19 IU/L (ref 0–40)
Albumin/Globulin Ratio: 1.4 (ref 1.2–2.2)
Albumin: 4.3 g/dL (ref 3.6–4.6)
Alkaline Phosphatase: 64 IU/L (ref 44–121)
BUN/Creatinine Ratio: 12 (ref 12–28)
BUN: 15 mg/dL (ref 8–27)
Bilirubin Total: 0.4 mg/dL (ref 0.0–1.2)
CO2: 23 mmol/L (ref 20–29)
Calcium: 10.4 mg/dL — ABNORMAL HIGH (ref 8.7–10.3)
Chloride: 100 mmol/L (ref 96–106)
Creatinine, Ser: 1.23 mg/dL — ABNORMAL HIGH (ref 0.57–1.00)
GFR calc Af Amer: 47 mL/min/{1.73_m2} — ABNORMAL LOW (ref >59)
GFR calc non Af Amer: 41 mL/min/{1.73_m2} — ABNORMAL LOW (ref >59)
Globulin, Total: 3 g/dL (ref 1.5–4.5)
Glucose: 123 mg/dL — ABNORMAL HIGH (ref 65–99)
Potassium: 4.3 mmol/L (ref 3.5–5.2)
Sodium: 139 mmol/L (ref 134–144)
Total Protein: 7.3 g/dL (ref 6.0–8.5)

## 2020-04-18 LAB — CBC
Hematocrit: 48.5 % — ABNORMAL HIGH (ref 34.0–46.6)
Hemoglobin: 16.2 g/dL — ABNORMAL HIGH (ref 11.1–15.9)
MCH: 30.6 pg (ref 26.6–33.0)
MCHC: 33.4 g/dL (ref 31.5–35.7)
MCV: 92 fL (ref 79–97)
Platelets: 267 10*3/uL (ref 150–450)
RBC: 5.29 x10E6/uL — ABNORMAL HIGH (ref 3.77–5.28)
RDW: 14 % (ref 11.7–15.4)
WBC: 8.2 10*3/uL (ref 3.4–10.8)

## 2020-04-18 LAB — TSH: TSH: 2.53 u[IU]/mL (ref 0.450–4.500)

## 2020-04-18 LAB — T4, FREE: Free T4: 1.87 ng/dL — ABNORMAL HIGH (ref 0.82–1.77)

## 2020-04-23 ENCOUNTER — Other Ambulatory Visit (HOSPITAL_COMMUNITY)
Admission: RE | Admit: 2020-04-23 | Discharge: 2020-04-23 | Disposition: A | Discharge status: Home / Self Care | Payer: Medicare Other | Source: Ambulatory Visit | Attending: Cardiology | Admitting: Cardiology

## 2020-04-23 DIAGNOSIS — Z01812 Encounter for preprocedural laboratory examination: Secondary | ICD-10-CM | POA: Diagnosis not present

## 2020-04-23 DIAGNOSIS — Z20822 Contact with and (suspected) exposure to covid-19: Secondary | ICD-10-CM | POA: Diagnosis not present

## 2020-04-24 LAB — SARS CORONAVIRUS 2 (TAT 6-24 HRS): SARS Coronavirus 2: NEGATIVE

## 2020-04-25 ENCOUNTER — Ambulatory Visit (HOSPITAL_COMMUNITY): Payer: Medicare Other | Admitting: Registered Nurse

## 2020-04-25 ENCOUNTER — Other Ambulatory Visit: Payer: Self-pay

## 2020-04-25 ENCOUNTER — Ambulatory Visit (HOSPITAL_COMMUNITY)
Admission: RE | Admit: 2020-04-25 | Discharge: 2020-04-25 | Disposition: A | Discharge status: Home / Self Care | Payer: Medicare Other | Attending: Cardiology | Admitting: Cardiology

## 2020-04-25 ENCOUNTER — Encounter (HOSPITAL_COMMUNITY): Payer: Self-pay | Admitting: Cardiology

## 2020-04-25 ENCOUNTER — Encounter (HOSPITAL_COMMUNITY)
Admission: RE | Disposition: A | Discharge status: Home / Self Care | Payer: Self-pay | Source: Home / Self Care | Attending: Cardiology

## 2020-04-25 DIAGNOSIS — Z79890 Hormone replacement therapy (postmenopausal): Secondary | ICD-10-CM | POA: Diagnosis not present

## 2020-04-25 DIAGNOSIS — Z87891 Personal history of nicotine dependence: Secondary | ICD-10-CM | POA: Insufficient documentation

## 2020-04-25 DIAGNOSIS — I484 Atypical atrial flutter: Secondary | ICD-10-CM | POA: Diagnosis not present

## 2020-04-25 DIAGNOSIS — I4819 Other persistent atrial fibrillation: Secondary | ICD-10-CM | POA: Diagnosis not present

## 2020-04-25 DIAGNOSIS — Z7984 Long term (current) use of oral hypoglycemic drugs: Secondary | ICD-10-CM | POA: Insufficient documentation

## 2020-04-25 DIAGNOSIS — I4892 Unspecified atrial flutter: Secondary | ICD-10-CM | POA: Diagnosis not present

## 2020-04-25 DIAGNOSIS — R6520 Severe sepsis without septic shock: Secondary | ICD-10-CM | POA: Diagnosis not present

## 2020-04-25 DIAGNOSIS — Z7901 Long term (current) use of anticoagulants: Secondary | ICD-10-CM | POA: Insufficient documentation

## 2020-04-25 DIAGNOSIS — Z79899 Other long term (current) drug therapy: Secondary | ICD-10-CM | POA: Diagnosis not present

## 2020-04-25 DIAGNOSIS — A419 Sepsis, unspecified organism: Secondary | ICD-10-CM | POA: Diagnosis not present

## 2020-04-25 HISTORY — PX: CARDIOVERSION: SHX1299

## 2020-04-25 SURGERY — CARDIOVERSION
Anesthesia: General

## 2020-04-25 MED ORDER — PROPOFOL 10 MG/ML IV BOLUS
INTRAVENOUS | Status: DC | PRN
  Administered 2020-04-25: 30 mg via INTRAVENOUS
  Administered 2020-04-25: 50 mg via INTRAVENOUS

## 2020-04-25 MED ORDER — SODIUM CHLORIDE 0.9 % IV SOLN: INTRAVENOUS | Status: DC | PRN

## 2020-04-25 MED ORDER — LIDOCAINE 2% (20 MG/ML) 5 ML SYRINGE
INTRAMUSCULAR | Status: DC | PRN
  Administered 2020-04-25: 60 mg via INTRAVENOUS

## 2020-04-25 NOTE — Discharge Instructions (Signed)
Electrical Cardioversion Electrical cardioversion is the delivery of a jolt of electricity to restore a normal rhythm to the heart. A rhythm that is too fast or is not regular keeps the heart from pumping well. In this procedure, sticky patches or metal paddles are placed on the chest to deliver electricity to the heart from a device. This procedure may be done in an emergency if:  There is low or no blood pressure as a result of the heart rhythm.  Normal rhythm must be restored as fast as possible to protect the brain and heart from further damage.  It may save a life. This may also be a scheduled procedure for irregular or fast heart rhythms that are not immediately life-threatening. Tell a health care provider about:  Any allergies you have.  All medicines you are taking, including vitamins, herbs, eye drops, creams, and over-the-counter medicines.  Any problems you or family members have had with anesthetic medicines.  Any blood disorders you have.  Any surgeries you have had.  Any medical conditions you have.  Whether you are pregnant or may be pregnant. What are the risks? Generally, this is a safe procedure. However, problems may occur, including:  Allergic reactions to medicines.  A blood clot that breaks free and travels to other parts of your body.  The possible return of an abnormal heart rhythm within hours or days after the procedure.  Your heart stopping (cardiac arrest). This is rare. What happens before the procedure? Medicines  Your health care provider may have you start taking: ? Blood-thinning medicines (anticoagulants) so your blood does not clot as easily. ? Medicines to help stabilize your heart rate and rhythm.  Ask your health care provider about: ? Changing or stopping your regular medicines. This is especially important if you are taking diabetes medicines or blood thinners. ? Taking medicines such as aspirin and ibuprofen. These medicines can  thin your blood. Do not take these medicines unless your health care provider tells you to take them. ? Taking over-the-counter medicines, vitamins, herbs, and supplements. General instructions  Follow instructions from your health care provider about eating or drinking restrictions.  Plan to have someone take you home from the hospital or clinic.  If you will be going home right after the procedure, plan to have someone with you for 24 hours.  Ask your health care provider what steps will be taken to help prevent infection. These may include washing your skin with a germ-killing soap. What happens during the procedure?  An IV will be inserted into one of your veins.  Sticky patches (electrodes) or metal paddles may be placed on your chest.  You will be given a medicine to help you relax (sedative).  An electrical shock will be delivered. The procedure may vary among health care providers and hospitals.   What can I expect after the procedure?  Your blood pressure, heart rate, breathing rate, and blood oxygen level will be monitored until you leave the hospital or clinic.  Your heart rhythm will be watched to make sure it does not change.  You may have some redness on the skin where the shocks were given. Follow these instructions at home:  Do not drive for 24 hours if you were given a sedative during your procedure.  Take over-the-counter and prescription medicines only as told by your health care provider.  Ask your health care provider how to check your pulse. Check it often.  Rest for 48 hours after the procedure   or as told by your health care provider.  Avoid or limit your caffeine use as told by your health care provider.  Keep all follow-up visits as told by your health care provider. This is important. Contact a health care provider if:  You feel like your heart is beating too quickly or your pulse is not regular.  You have a serious muscle cramp that does not go  away. Get help right away if:  You have discomfort in your chest.  You are dizzy or you feel faint.  You have trouble breathing or you are short of breath.  Your speech is slurred.  You have trouble moving an arm or leg on one side of your body.  Your fingers or toes turn cold or blue. Summary  Electrical cardioversion is the delivery of a jolt of electricity to restore a normal rhythm to the heart.  This procedure may be done right away in an emergency or may be a scheduled procedure if the condition is not an emergency.  Generally, this is a safe procedure.  After the procedure, check your pulse often as told by your health care provider. This information is not intended to replace advice given to you by your health care provider. Make sure you discuss any questions you have with your health care provider. Document Revised: 09/27/2018 Document Reviewed: 09/27/2018 Elsevier Patient Education  2021 Elsevier Inc.  

## 2020-04-25 NOTE — Transfer of Care (Signed)
Immediate Anesthesia Transfer of Care Note  Patient: Miranda Wilkinson  Procedure(s) Performed: CARDIOVERSION (N/A )  Patient Location: PACU and Endoscopy Unit  Anesthesia Type:General  Level of Consciousness: drowsy  Airway & Oxygen Therapy: Patient Spontanous Breathing  Post-op Assessment: Report given to RN and Post -op Vital signs reviewed and stable  Post vital signs: Reviewed and stable  Last Vitals:  Vitals Value Taken Time  BP    Temp    Pulse    Resp    SpO2      Last Pain:  Vitals:   04/25/20 0943  TempSrc: Oral  PainSc: 0-No pain         Complications: No complications documented.

## 2020-04-25 NOTE — Anesthesia Procedure Notes (Signed)
Date/Time: 04/25/2020 9:52 AM Performed by: Trinna Post., CRNA Pre-anesthesia Checklist: Patient identified, Emergency Drugs available, Suction available, Patient being monitored and Timeout performed Patient Re-evaluated:Patient Re-evaluated prior to induction Oxygen Delivery Method: Ambu bag Preoxygenation: Pre-oxygenation with 100% oxygen Induction Type: IV induction Placement Confirmation: positive ETCO2

## 2020-04-25 NOTE — Anesthesia Postprocedure Evaluation (Signed)
Anesthesia Post Note  Patient: Miranda Wilkinson  Procedure(s) Performed: CARDIOVERSION (N/A )     Patient location during evaluation: PACU Anesthesia Type: General Level of consciousness: awake and alert Pain management: pain level controlled Vital Signs Assessment: post-procedure vital signs reviewed and stable Respiratory status: spontaneous breathing, nonlabored ventilation, respiratory function stable and patient connected to nasal cannula oxygen Cardiovascular status: blood pressure returned to baseline and stable Postop Assessment: no apparent nausea or vomiting Anesthetic complications: no   No complications documented.  Last Vitals:  Vitals:   04/25/20 1012 04/25/20 1022  BP: (!) 116/40 (!) 116/47  Pulse: (!) 59   Resp: (!) 22   Temp:    SpO2: 94%     Last Pain:  Vitals:   04/25/20 1022  TempSrc:   PainSc: 0-No pain                 Kortny Lirette,W. EDMOND

## 2020-04-25 NOTE — Anesthesia Preprocedure Evaluation (Signed)
Anesthesia Evaluation  Patient identified by MRN, date of birth, ID band Patient awake    Reviewed: Allergy & Precautions, H&P , NPO status , Patient's Chart, lab work & pertinent test results  Airway Mallampati: III  TM Distance: >3 FB Neck ROM: Full    Dental no notable dental hx. (+) Teeth Intact, Dental Advisory Given   Pulmonary shortness of breath, sleep apnea , COPD,  oxygen dependent, former smoker,    Pulmonary exam normal breath sounds clear to auscultation       Cardiovascular hypertension, Pt. on medications and Pt. on home beta blockers + dysrhythmias Atrial Fibrillation  Rhythm:Irregular Rate:Normal     Neuro/Psych  Headaches, Anxiety Depression    GI/Hepatic Neg liver ROS, GERD  ,  Endo/Other  diabetes, Type 2, Oral Hypoglycemic AgentsHypothyroidism Morbid obesity  Renal/GU negative Renal ROS  negative genitourinary   Musculoskeletal   Abdominal   Peds  Hematology  (+) Blood dyscrasia, anemia ,   Anesthesia Other Findings   Reproductive/Obstetrics negative OB ROS                             Anesthesia Physical Anesthesia Plan  ASA: III  Anesthesia Plan: General   Post-op Pain Management:    Induction: Intravenous  PONV Risk Score and Plan: 3 and Propofol infusion and Treatment may vary due to age or medical condition  Airway Management Planned: Mask  Additional Equipment:   Intra-op Plan:   Post-operative Plan:   Informed Consent: I have reviewed the patients History and Physical, chart, labs and discussed the procedure including the risks, benefits and alternatives for the proposed anesthesia with the patient or authorized representative who has indicated his/her understanding and acceptance.     Dental advisory given  Plan Discussed with: CRNA  Anesthesia Plan Comments:         Anesthesia Quick Evaluation

## 2020-04-25 NOTE — Procedures (Signed)
Procedure: Electrical Cardioversion Indications:  Atrial Flutter  Procedure Details:  Consent: Risks of procedure as well as the alternatives and risks of each were explained to the (patient/caregiver).  Consent for procedure obtained.  Time Out: Verified patient identification, verified procedure, site/side was marked, verified correct patient position, special equipment/implants available, medications/allergies/relevent history reviewed, required imaging and test results available. PERFORMED.  Patient placed on cardiac monitor, pulse oximetry, supplemental oxygen as necessary.  Sedation given: Propofol 80mg ; lidocaine 60mg  Pacer pads placed anterior and posterior chest.  Cardioverted 2 time(s).  Cardioversion with synchronized biphasic 200J shock.  Evaluation: Findings: Post procedure EKG shows: NSR with frequent PACs Complications: None Patient did tolerate procedure well.  Time Spent Directly with the Patient:  32minutes   Freada Bergeron 04/25/2020, 9:58 AM

## 2020-04-25 NOTE — Interval H&P Note (Signed)
History and Physical Interval Note:  04/25/2020 9:36 AM  Miranda Wilkinson  has presented today for surgery, with the diagnosis of A-FLUTTER.  The various methods of treatment have been discussed with the patient and family. After consideration of risks, benefits and other options for treatment, the patient has consented to  Procedure(s): CARDIOVERSION (N/A) as a surgical intervention.  The patient's history has been reviewed, patient examined, no change in status, stable for surgery.  I have reviewed the patient's chart and labs.  Questions were answered to the patient's satisfaction.     Freada Bergeron

## 2020-04-26 ENCOUNTER — Encounter (HOSPITAL_COMMUNITY): Payer: Self-pay | Admitting: Cardiology

## 2020-05-29 ENCOUNTER — Ambulatory Visit (INDEPENDENT_AMBULATORY_CARE_PROVIDER_SITE_OTHER): Payer: Medicare Other | Admitting: Cardiology

## 2020-05-29 ENCOUNTER — Encounter: Payer: Self-pay | Admitting: Cardiology

## 2020-05-29 ENCOUNTER — Other Ambulatory Visit: Payer: Self-pay

## 2020-05-29 VITALS — BP 158/70 | HR 75 | Ht 64.0 in | Wt 217.0 lb

## 2020-05-29 DIAGNOSIS — I484 Atypical atrial flutter: Secondary | ICD-10-CM

## 2020-05-29 DIAGNOSIS — I10 Essential (primary) hypertension: Secondary | ICD-10-CM | POA: Diagnosis not present

## 2020-05-29 DIAGNOSIS — I5032 Chronic diastolic (congestive) heart failure: Secondary | ICD-10-CM

## 2020-05-29 DIAGNOSIS — I4821 Permanent atrial fibrillation: Secondary | ICD-10-CM

## 2020-05-29 DIAGNOSIS — I4819 Other persistent atrial fibrillation: Secondary | ICD-10-CM

## 2020-05-29 MED ORDER — HYDROCHLOROTHIAZIDE 12.5 MG PO CAPS: 12.5000 mg | ORAL_CAPSULE | Freq: Every day | ORAL | 3 refills | Status: DC

## 2020-05-29 NOTE — Patient Instructions (Addendum)
Medication Instructions:  Stop Amiodarone Start Hydrochlorothiazide 12.5 mg daily Your physician recommends that you continue on your current medications as directed. Please refer to the Current Medication list given to you today.  Labwork: None ordered.  Testing/Procedures: None ordered.  Follow-Up: Your physician wants you to follow-up in: 1-2 week follow up with Dr. Harl Bowie or APP in [place] for labs and titrate HCTZ. 1 yr follow up with Dr. Quentin Ore.    Any Other Special Instructions Will Be Listed Below (If Applicable).  If you need a refill on your cardiac medications before your next appointment, please call your pharmacy.

## 2020-05-29 NOTE — Progress Notes (Signed)
Electrophysiology Office Follow up Visit Note:    Date:  05/29/2020   ID:  Miranda Wilkinson, DOB 1935-04-18, MRN [id number]  PCP:  Caryl Bis, MD  [place] Cardiologist:  Carlyle Dolly, MD  [facility] Electrophysiologist:  Vickie Epley, MD    Interval History:    Miranda Wilkinson is a 85 y.o. female who presents for a follow up visit. They were last seen in clinic February 28, 2020 for persistent atrial fibrillation.   She had previously been on dofetilide but this has been discontinued.  At our last appointment, amiodarone was started and a cardioversion was arranged.  She was successfully cardioverted on April 25, 2020 but unfortunately she has returned back to atrial fibrillation with controlled ventricular rates of 75 bpm.    Past Medical History:  Diagnosis Date  . Allergy    seasonal allergies  . Anemia    IDA- on iron supplement  . Anxiety   . Atrial fibrillation ([place])   . Blood transfusion without reported diagnosis    2021- due to IDA/ low hemo (7.3)  . Cancer ([place]) 1976   bilaterl thyroid ca  . Diabetes mellitus without complication ([place])    on meds  . GERD (gastroesophageal reflux disease)    on meds---OTC  . Headache(784.0)    hx migraines  . Hyperlipidemia    on meds  . Hypertension    on meds  . Hypothyroidism    thyroidectomy in 1976- on meds at  this time  . On supplemental oxygen therapy    2 L at night  . Shortness of breath   . Sinus congestion   . Sleep apnea    study done in [place], [place], use CPAP----with home O2 concentrater at night with CPAP    Past Surgical History:  Procedure Laterality Date  . APPENDECTOMY    . CARDIOVERSION N/A 10/27/2019   Procedure: CARDIOVERSION;  Surgeon: [name], MD;  [facility] ENDOSCOPY;  Service: Cardiovascular;  Laterality: N/A;  . CARDIOVERSION N/A 04/25/2020   Procedure: CARDIOVERSION;  Surgeon: Freada Bergeron, MD;  Location: [facility] ENDOSCOPY;  Service:  Cardiovascular;  Laterality: N/A;  . CHOLECYSTECTOMY    . COLONOSCOPY WITH PROPOFOL N/A 01/09/2020   Procedure: COLONOSCOPY WITH PROPOFOL;  Surgeon: Doran Stabler, MD;  Location: [facility] ENDOSCOPY;  Service: Gastroenterology;  Laterality: N/A;  . ESOPHAGOGASTRODUODENOSCOPY (EGD) WITH PROPOFOL N/A 01/09/2020   Procedure: ESOPHAGOGASTRODUODENOSCOPY (EGD) WITH PROPOFOL;  Surgeon: Doran Stabler, MD;  Location: [facility] ENDOSCOPY;  Service: Gastroenterology;  Laterality: N/A;  . HEMOSTASIS CLIP PLACEMENT  01/09/2020   Procedure: HEMOSTASIS CLIP PLACEMENT;  Surgeon: Doran Stabler, MD;  Location: [facility] ENDOSCOPY;  Service: Gastroenterology;;  . OPEN REDUCTION INTERNAL FIXATION (ORIF) TIBIA/FIBULA FRACTURE Right 11/03/2013   Procedure: OPEN REDUCTION INTERNAL FIXATION (ORIF) RIGHT TIBIA/FIBULA PILON FRACTURE;  Surgeon: Wylene Simmer, MD;  Location: [place];  Service: Orthopedics;  Laterality: Right;  . OVARIAN CYST SURGERY Left    age 66  . POLYPECTOMY  01/09/2020   Procedure: POLYPECTOMY;  Surgeon: Doran Stabler, MD;  Location: [facility] ENDOSCOPY;  Service: Gastroenterology;;  . THYROIDECTOMY Bilateral 1976  . TUBAL LIGATION      Current Medications: Current Meds  Medication Sig  . acetaminophen (TYLENOL) 500 MG tablet Take 500 mg by mouth every 6 (six) hours as needed (for pain.).  Marland Kitchen amiodarone (PACERONE) 200 MG tablet Take 2 tablets (400 mg total) by mouth 2 (two) times daily for 5 days,  THEN 1 tablet (200 mg total) daily.  Marland Kitchen amLODipine (NORVASC) 10 MG tablet Take 1 tablet (10 mg total) by mouth daily.  . bisoprolol (ZEBETA) 10 MG tablet Take 1 tablet (10 mg total) by mouth daily.  . cetirizine (ZYRTEC) 10 MG tablet Take 10 mg by mouth daily as needed for allergies.   . Cholecalciferol (VITAMIN D3) 50 MCG (2000 UT) capsule Take 2,000 Units by mouth daily.  Marland Kitchen ELIQUIS 5 MG TABS tablet TAKE 1 TABLET TWICE A DAY  . glipiZIDE (GLUCOTROL XL) 10 MG 24 hr tablet Take 10 mg by mouth daily.  . GUAIFENESIN 1200  PO Take 1,200 mg by mouth 2 (two) times daily as needed (sinus congestion.).  Marland Kitchen iron polysaccharides (NU-IRON) 150 MG capsule Take 1 capsule (150 mg total) by mouth daily.  Marland Kitchen levothyroxine (SYNTHROID) 112 MCG tablet Take 112 mcg by mouth daily before breakfast.   . melatonin 5 MG TABS Take 2.5-5 mg by mouth at bedtime as needed (sleep).   . metFORMIN (GLUCOPHAGE-XR) 500 MG 24 hr tablet Take 1,000 mg by mouth daily with supper.  . Multiple Vitamins-Minerals (MULTIVITAMIN WITH MINERALS) tablet Take 1 tablet by mouth daily.  Marland Kitchen omeprazole (PRILOSEC) 20 MG capsule Take 20 mg by mouth daily as needed (acid reflux/indigestion.).  Marland Kitchen simvastatin (ZOCOR) 40 MG tablet Take 20 mg by mouth daily with supper.   . triamcinolone cream (KENALOG) 0.1 % Apply 1 application topically 2 (two) times daily as needed (psoriasis).     Allergies:   Patient has no known allergies.   Social History   Socioeconomic History  . Marital status: Widowed    Spouse name: Not on file  . Number of children: 2  . Years of education: Not on file  . Highest education level: Not on file  Occupational History  . Occupation: retired  Tobacco Use  . Smoking status: Former Smoker    Packs/day: 1.50    Years: 60.00    Pack years: 90.00    Types: Cigarettes    Quit date: 11/03/2011    Years since quitting: 8.5  . Smokeless tobacco: Never Used  Vaping Use  . Vaping Use: Never used  Substance and Sexual Activity  . Alcohol use: No  . Drug use: No  . Sexual activity: Not Currently  Other Topics Concern  . Not on file  Social History Narrative  . Not on file   Social Determinants of Health   Financial Resource Strain: Not on file  Food Insecurity: Not on file  Transportation Needs: Not on file  Physical Activity: Not on file  Stress: Not on file  Social Connections: Not on file     Family History: The patient's family history includes Arrhythmia in her mother and sister; Cancer in her sister; Diabetes in her  mother; Esophageal cancer (age of onset: 7) in her brother; Heart attack in her father; Heart disease in her mother; Hypertension in her brother, brother, and sister; Stomach cancer (age of onset: 97) in her brother; Stroke in her brother, father, mother, and sister; Thyroid disease in her mother. There is no history of Colon polyps, Colon cancer, or Rectal cancer.  ROS:   Please see the history of present illness.    All other systems reviewed and are negative.  EKGs/Labs/Other Studies Reviewed:    The following studies were reviewed today: Prior notes, hospital records outside lab work  Outside lab work from January 30, 2020 shows a hemoglobin of 14.8  EKG:  The ekg  ordered today demonstrates atrial fibrillation with a ventricular rate of 73 bpm  December 09, 2019 echo personally reviewed Left ventricular function normal, 60% Right ventricular function normal No significant valvular abnormalities   Recent Labs: 11/05/2019: Magnesium 2.2 12/09/2019: B Natriuretic Peptide 714.3 04/17/2020: ALT 18; BUN 15; Creatinine, Ser 1.23; Hemoglobin 16.2; Platelets 267; Potassium 4.3; Sodium 139; TSH 2.530  Recent Lipid Panel No results found for: CHOL, TRIG, HDL, CHOLHDL, VLDL, LDLCALC, LDLDIRECT  Physical Exam:    VS:  There were no vitals taken for this visit.    Wt Readings from Last 3 Encounters:  04/25/20 217 lb (98.4 kg)  04/17/20 215 lb 3.2 oz (97.6 kg)  03/14/20 220 lb 6.4 oz (100 kg)     GEN:  Well nourished, well developed in no acute distress HEENT: Normal.  Very hard of hearing. NECK: No JVD; No carotid bruits LYMPHATICS: No lymphadenopathy CARDIAC: Irregularly irregular, no murmurs, rubs, gallops RESPIRATORY:  Clear to auscultation without rales, wheezing or rhonchi  ABDOMEN: Soft, non-tender, non-distended MUSCULOSKELETAL: 1+ pitting bilateral lower extremity edema; No deformity  SKIN: Warm and dry NEUROLOGIC:  Alert and oriented x 3 PSYCHIATRIC:  Normal affect    ASSESSMENT:    1. Permanent atrial fibrillation ([place])   2. Atypical atrial flutter ([facility])    PLAN:    In order of problems listed above:  1.  Permanent atrial fibrillation Patient continues to take Eliquis for stroke prophylaxis.  At this point she has failed cardioversion after loading with amiodarone.  After reviewing her records I would deem her atrial fibrillation to be permanent at this point.  I would recommend continued rate control.  In an effort to avoid off target effects from amiodarone, I would recommend stopping this medication today.  We can use beta-blockers to help control her rates.  If, in the future, rate control becomes difficult, can consider restarting amiodarone.  2.  Chronic diastolic heart failure Volume overloaded on exam today.  Has previously taken hydrochlorothiazide but this had to be stopped when we loaded with Tikosyn.  I will plan to restart 12.5 mg daily of hydrochlorothiazide with follow-up in 1 to 2 weeks.  At that follow-up appointment, blood work should be drawn to assess her electrolytes.  3.  Hypertension Above goal.  Initiating hydrochlorothiazide as above.  We will try to arrange follow-up with her primary cardiologist/PA/NP in 1 to 2 weeks to reassess her blood pressure, lab work and to see if any further titration in her antihypertensive regimen is warranted.  Follow-up with me in 1 year or sooner as needed.   Total time of encounter: 38 minutes total time of encounter. This time includes coordination of care and counseling regarding high complexity medical decision making re: atrial fibrillation. Remainder of non-face-to-face time involved reviewing chart documents/testing relevant to the patient encounter and documentation in the medical record.   Medication Adjustments/Labs and Tests Ordered: Current medicines are reviewed at length with the patient today.  Concerns regarding medicines are outlined above.  Orders Placed This Encounter   Procedures  . EKG 12-Lead   No orders of the defined types were placed in this encounter.    Signed, Lars Mage, MD, [facility]  05/29/2020 8:56 AM    Electrophysiology [place]

## 2020-06-11 NOTE — Progress Notes (Signed)
Cardiology Office Note  Date: 06/12/2020   ID: Miranda Wilkinson, DOB [birth year], MRN [id number]  PCP:  Caryl Bis, MD  Cardiologist:  Carlyle Dolly, MD Electrophysiologist:  Vickie Epley, MD   Chief Complaint: Cardiac follow-up.   History of Present Illness: Miranda Wilkinson is a 85 y.o. female with a history of atrial fibrillation, HTN, HLD, hypothyroidism, sleep apnea, and 2, anemia.  COPD, morbid obesity, chronic diastolic heart failure . Last seen by Dr. Quentin Ore on 05/29/2020 for permanent atrial fibrillation/atypical atrial flutter, HTN, chronic diastolic heart failure.  She had previously been on dofetilide but this had been discontinued.  Prior visit amiodarone was started and cardioversion was arranged.  She was successfully cardioverted on April 25, 2020 but unfortunately returned back to atrial fibrillation with controlled ventricular rates in the 75 bpm range.  Plans were to continue rate control.  He recommended stopping amiodarone due to side effects.  Plan was to use beta-blockers to help control rates.  If in future rate control became difficult could reconsider restarting amiodarone.  Continuing Eliquis for stroke prophylaxis.  She was volume overloaded on exam.  She had previously been on HCTZ but had to be stopped when she was loaded with Tikosyn.  Plan was to restart HCTZ 12.5 mg and follow-up in 1 to 2 weeks.  Blood work to be drawn to reassess her electrolytes.  Hypertension was above goal.  HCTZ started at 12.5 mg.  She is here for follow-up after seeing Dr. Quentin Ore on 05/29/2020 for her permanent atrial fibrillation.  He restarted her HCTZ.  Her amlodipine now is at at 10 mg daily.  Blood pressure is improved since increasing the dose.  Today's blood pressure 134/82.  She admits to some lower extremity edema.  I mentioned this more than likely is a side effect of amlodipine which causes some ankle edema.  Weight is stable at 217.  Denies any S OB or DOE at the  moment.  Denies any sensation of palpitations or arrhythmias, orthostatic symptoms, CVA or TIA-like symptoms, bleeding issues.  Denies any PND, orthopnea.  Denies any claudication-like symptoms, or DVT/PE-like symptoms.   Past Medical History:  Diagnosis Date  . Allergy    seasonal allergies  . Anemia    IDA- on iron supplement  . Anxiety   . Atrial fibrillation ([place])   . Blood transfusion without reported diagnosis    2021- due to IDA/ low hemo (7.3)  . Cancer ([place]) 1976   bilaterl thyroid ca  . Diabetes mellitus without complication ([place])    on meds  . GERD (gastroesophageal reflux disease)    on meds---OTC  . Headache(784.0)    hx migraines  . Hyperlipidemia    on meds  . Hypertension    on meds  . Hypothyroidism    thyroidectomy in 1976- on meds at  this time  . On supplemental oxygen therapy    2 L at night  . Shortness of breath   . Sinus congestion   . Sleep apnea    study done in [place], [place], use CPAP----with home O2 concentrater at night with CPAP    Past Surgical History:  Procedure Laterality Date  . APPENDECTOMY    . CARDIOVERSION N/A 10/27/2019   Procedure: CARDIOVERSION;  Surgeon: [name], MD;  Location: [facility] ENDOSCOPY;  Service: Cardiovascular;  Laterality: N/A;  . CARDIOVERSION N/A 04/25/2020   Procedure: CARDIOVERSION;  Surgeon: Freada Bergeron, MD;  Location: [place];  Service: Cardiovascular;  Laterality: N/A;  . CHOLECYSTECTOMY    . COLONOSCOPY WITH PROPOFOL N/A 01/09/2020   Procedure: COLONOSCOPY WITH PROPOFOL;  Surgeon: Doran Stabler, MD;  Location: [facility] ENDOSCOPY;  Service: Gastroenterology;  Laterality: N/A;  . ESOPHAGOGASTRODUODENOSCOPY (EGD) WITH PROPOFOL N/A 01/09/2020   Procedure: ESOPHAGOGASTRODUODENOSCOPY (EGD) WITH PROPOFOL;  Surgeon: Doran Stabler, MD;  Location: [facility] ENDOSCOPY;  Service: Gastroenterology;  Laterality: N/A;  . HEMOSTASIS CLIP PLACEMENT  01/09/2020   Procedure: HEMOSTASIS CLIP PLACEMENT;  Surgeon:  Doran Stabler, MD;  Location: [facility] ENDOSCOPY;  Service: Gastroenterology;;  . OPEN REDUCTION INTERNAL FIXATION (ORIF) TIBIA/FIBULA FRACTURE Right 11/03/2013   Procedure: OPEN REDUCTION INTERNAL FIXATION (ORIF) RIGHT TIBIA/FIBULA PILON FRACTURE;  Surgeon: Wylene Simmer, MD;  Location: [place];  Service: Orthopedics;  Laterality: Right;  . OVARIAN CYST SURGERY Left    age 24  . POLYPECTOMY  01/09/2020   Procedure: POLYPECTOMY;  Surgeon: Doran Stabler, MD;  Location: [facility] ENDOSCOPY;  Service: Gastroenterology;;  . THYROIDECTOMY Bilateral 1976  . TUBAL LIGATION      Current Outpatient Medications  Medication Sig Dispense Refill  . acetaminophen (TYLENOL) 500 MG tablet Take 500 mg by mouth every 6 (six) hours as needed (for pain.).    Marland Kitchen amLODipine (NORVASC) 10 MG tablet Take 1 tablet (10 mg total) by mouth daily. 90 tablet 3  . bisoprolol (ZEBETA) 10 MG tablet Take 1 tablet (10 mg total) by mouth daily. 90 tablet 3  . cetirizine (ZYRTEC) 10 MG tablet Take 10 mg by mouth daily as needed for allergies.     . Cholecalciferol (VITAMIN D3) 50 MCG (2000 UT) capsule Take 2,000 Units by mouth daily.    Marland Kitchen ELIQUIS 5 MG TABS tablet TAKE 1 TABLET TWICE A DAY 180 tablet 3  . glipiZIDE (GLUCOTROL XL) 10 MG 24 hr tablet Take 10 mg by mouth daily.    . GUAIFENESIN 1200 PO Take 1,200 mg by mouth 2 (two) times daily as needed (sinus congestion.).    Marland Kitchen hydrochlorothiazide (MICROZIDE) 12.5 MG capsule Take 1 capsule (12.5 mg total) by mouth daily. 90 capsule 3  . iron polysaccharides (NU-IRON) 150 MG capsule Take 1 capsule (150 mg total) by mouth daily. 30 capsule 0  . levothyroxine (SYNTHROID) 112 MCG tablet Take 112 mcg by mouth daily before breakfast.     . melatonin 5 MG TABS Take 2.5-5 mg by mouth at bedtime as needed (sleep).     . metFORMIN (GLUCOPHAGE-XR) 500 MG 24 hr tablet Take 1,000 mg by mouth daily with supper.    . Multiple Vitamins-Minerals (MULTIVITAMIN WITH MINERALS) tablet Take 1 tablet by mouth  daily.    Marland Kitchen omeprazole (PRILOSEC) 20 MG capsule Take 20 mg by mouth daily as needed (acid reflux/indigestion.).    Marland Kitchen simvastatin (ZOCOR) 40 MG tablet Take 20 mg by mouth daily with supper.     . triamcinolone cream (KENALOG) 0.1 % Apply 1 application topically 2 (two) times daily as needed (psoriasis).     No current facility-administered medications for this visit.   Allergies:  Patient has no known allergies.   Social History: The patient  reports that she quit smoking about 8 years ago. Her smoking use included cigarettes. She has a 90.00 pack-year smoking history. She has never used smokeless tobacco. She reports that she does not drink alcohol and does not use drugs.   Family History: The patient's family history includes Arrhythmia in her mother and sister; Cancer in her sister; Diabetes in her mother;  Esophageal cancer (age of onset: 56) in her brother; Heart attack in her father; Heart disease in her mother; Hypertension in her brother, brother, and sister; Stomach cancer (age of onset: 42) in her brother; Stroke in her brother, father, mother, and sister; Thyroid disease in her mother.   ROS:  Please see the history of present illness. Otherwise, complete review of systems is positive for none.  All other systems are reviewed and negative.   Physical Exam: VS:  BP 134/82   Pulse 86   Ht 5\' 4"  (1.626 m)   Wt 217 lb (98.4 kg)   SpO2 92%   BMI 37.25 kg/m , BMI Body mass index is 37.25 kg/m.  Wt Readings from Last 3 Encounters:  06/12/20 217 lb (98.4 kg)  05/29/20 217 lb (98.4 kg)  04/25/20 217 lb (98.4 kg)    General: Obese patient appears comfortable at rest. Neck: Supple, no elevated JVP or carotid bruits, no thyromegaly. Lungs: Clear to auscultation, nonlabored breathing at rest. Cardiac: Irregularly irregular rate and rhythm, no S3 or significant systolic murmur, no pericardial rub. Abdomen: Soft, nontender, no hepatomegaly, bowel sounds present, no guarding or  rebound. Extremities: Mild non-pitting edema, distal pulses 2+. Skin: Warm and dry. Musculoskeletal: No kyphosis. Neuropsychiatric: Alert and oriented x3, affect grossly appropriate.  ECG:  EKG May 29, 2020 atrial fibrillation with a rate of 72.  Recent Labwork: 11/05/2019: Magnesium 2.2 12/09/2019: B Natriuretic Peptide 714.3 04/17/2020: ALT 18; AST 19; BUN 15; Creatinine, Ser 1.23; Hemoglobin 16.2; Platelets 267; Potassium 4.3; Sodium 139; TSH 2.530  No results found for: CHOL, TRIG, HDL, CHOLHDL, VLDL, LDLCALC, LDLDIRECT  Other Studies Reviewed Today:  Echocardiogram 12/09/2019  1. Left ventricular ejection fraction, by estimation, is 60 to 65%. The left ventricle has normal function. The left ventricle has no regional wall motion abnormalities. There is mild left ventricular hypertrophy. Left ventricular diastolic function could not be evaluated. 2. Right ventricular systolic function is normal. The right ventricular size is normal. There is normal pulmonary artery systolic pressure. 3. The mitral valve is normal in structure. Trivial mitral valve regurgitation. No evidence of mitral stenosis. 4. The aortic valve is tricuspid. Aortic valve regurgitation is not visualized. Mild aortic valve sclerosis is present, with no evidence of aortic valve stenosis. 5. The inferior vena cava is normal in size with greater than 50% respiratory variability, suggesting right atrial pressure of 3 mmHg.  Cardiac monitor 07/05/2019 Study Highlights   7 day event monitor. Data available from 74% of planned time  Min HR 66, Max HR 132, Avg HR 84  No symptoms reported  She is in atrial flutter throughout the study that is overall rate controlled. Isolated episode of aflutter with aberrancy.        Assessment and Plan:  1. Permanent atrial fibrillation ([place])   2. Essential hypertension, benign   3. Chronic diastolic heart failure ([place])    1. Permanent atrial fibrillation  ([place]) Continues in atrial fibrillation.  Recently saw Dr. Quentin Ore.  She is currently on beta-blocker therapy with bisoprolol 10 mg daily and continuing Eliquis 5 mg p.o. twice daily.  Patient states Dr. Quentin Ore told her no other therapies including cardioversion, Tikosyn have been able to convert her back to her normal sinus rhythm.  He had stopped amiodarone due to side effects potential.  Continue bisoprolol 10 mg daily, Eliquis 5 mg p.o. twice daily.  2. Essential hypertension, benign Blood pressure much better controlled since Dr. Quentin Ore restarted HCTZ at 12.5 mg daily.  Continue amlodipine 10 mg daily.  Continue HCTZ 12.5 mg daily.  Get a follow-up basic metabolic panel and magnesium.  3. Chronic diastolic heart failure (HCC) No evidence of fluid overload on exam today.  Current weight is 217.  She has no DOE or shortness of breath.  She has some mild lower extremity edema which is more likely attributable to increased dose of amlodipine.  Continue bisoprolol 10 mg daily.  Continue HCTZ 12.5 mg daily.  Medication Adjustments/Labs and Tests Ordered: Current medicines are reviewed at length with the patient today.  Concerns regarding medicines are outlined above.   Disposition: Follow-up with Dr. Harl Bowie or APP 6 months  Signed, Levell July, NP 06/12/2020 10:23 AM    [facility] [place], [place], [place] [address] Phone: [phone]; Fax: [phone]

## 2020-06-12 ENCOUNTER — Encounter: Payer: Self-pay | Admitting: Family Medicine

## 2020-06-12 ENCOUNTER — Other Ambulatory Visit (HOSPITAL_COMMUNITY)
Admission: RE | Admit: 2020-06-12 | Discharge: 2020-06-12 | Disposition: A | Discharge status: Home / Self Care | Payer: Medicare Other | Source: Ambulatory Visit | Attending: Family Medicine | Admitting: Family Medicine

## 2020-06-12 ENCOUNTER — Telehealth: Payer: Self-pay | Admitting: *Deleted

## 2020-06-12 ENCOUNTER — Ambulatory Visit (INDEPENDENT_AMBULATORY_CARE_PROVIDER_SITE_OTHER): Payer: Medicare Other | Admitting: Family Medicine

## 2020-06-12 ENCOUNTER — Other Ambulatory Visit: Payer: Self-pay

## 2020-06-12 VITALS — BP 134/82 | HR 86 | Ht 64.0 in | Wt 217.0 lb

## 2020-06-12 DIAGNOSIS — Z23 Encounter for immunization: Secondary | ICD-10-CM | POA: Diagnosis not present

## 2020-06-12 DIAGNOSIS — I5032 Chronic diastolic (congestive) heart failure: Secondary | ICD-10-CM | POA: Diagnosis not present

## 2020-06-12 DIAGNOSIS — I10 Essential (primary) hypertension: Secondary | ICD-10-CM

## 2020-06-12 DIAGNOSIS — I4821 Permanent atrial fibrillation: Secondary | ICD-10-CM

## 2020-06-12 LAB — BASIC METABOLIC PANEL
Anion gap: 11 (ref 5–15)
BUN: 16 mg/dL (ref 8–23)
CO2: 27 mmol/L (ref 22–32)
Calcium: 10.5 mg/dL — ABNORMAL HIGH (ref 8.9–10.3)
Chloride: 102 mmol/L (ref 98–111)
Creatinine, Ser: 1.21 mg/dL — ABNORMAL HIGH (ref 0.44–1.00)
GFR, Estimated: 44 mL/min — ABNORMAL LOW (ref >60)
Glucose, Bld: 109 mg/dL — ABNORMAL HIGH (ref 70–99)
Potassium: 4.1 mmol/L (ref 3.5–5.1)
Sodium: 140 mmol/L (ref 135–145)

## 2020-06-12 LAB — MAGNESIUM: Magnesium: 1.8 mg/dL (ref 1.7–2.4)

## 2020-06-12 NOTE — Telephone Encounter (Signed)
Laurine Blazer, LPN  05/10/9922 2:68 PM EDT Back to Top     Patient notified via detailed voice message, copy to pcp.    Verta Ellen., NP  06/12/2020 1:27 PM EDT      Labs look stable. Kidney function appears to be improved from 6 months ago. Thanks

## 2020-06-12 NOTE — Patient Instructions (Addendum)
Medication Instructions:  Continue all current medications.  Labwork:  BMET, Mg - orders given today.   Office will contact with results via phone or letter.    Testing/Procedures: none  Follow-Up: 6 months   Any Other Special Instructions Will Be Listed Below (If Applicable).  If you need a refill on your cardiac medications before your next appointment, please call your pharmacy.

## 2020-06-18 ENCOUNTER — Telehealth: Payer: Self-pay | Admitting: Family Medicine

## 2020-06-18 NOTE — Telephone Encounter (Signed)
Pt c/o swelling in feet since mid March and now feet and hands are swelling since increase of amlodipine 10 mg daily - denies SOB weight gain - wanting to change or decrease amlodipine - says home BP are WNL and LOV BP was 134/82

## 2020-06-18 NOTE — Telephone Encounter (Signed)
Pt is having more swelling in her hands and feet from the amlodipine and she's wanting to be on a different medication  Please call [phone]- pt's daughter Kandis Cocking   Would like to know if they need to schedule another apt as well

## 2020-06-18 NOTE — Telephone Encounter (Signed)
Can stop norvasc, increase HCTZ to 25mg  daily and update [place] in 1 week on bp and swelling  Zandra Abts MD

## 2020-06-19 NOTE — Telephone Encounter (Signed)
Patient daughter is returning call to [facility]

## 2020-06-19 NOTE — Telephone Encounter (Signed)
Pt daughter voiced understanding - updated medication list and daughter will monitor BP/swelling and call us next week with an update

## 2020-06-29 ENCOUNTER — Telehealth: Payer: Self-pay | Admitting: Family Medicine

## 2020-06-29 MED ORDER — HYDROCHLOROTHIAZIDE 25 MG PO TABS: 25.0000 mg | ORAL_TABLET | Freq: Every day | ORAL | 1 refills | Status: DC

## 2020-06-29 NOTE — Telephone Encounter (Signed)
Medication sent to pharmacy  

## 2020-06-29 NOTE — Telephone Encounter (Signed)
*  STAT* If patient is at the pharmacy, call can be transferred to refill team.   1. Which medications need to be refilled? (please list name of each medication and dose if known) hydrochlorothiazide (HYDRODIURIL) 25 MG tablet  2. Which pharmacy/location (including street and city if local pharmacy) is medication to be sent to?   [facility] on [address] in [place] ph#[phone]  3. Do they need a 30 day or 90 day supply? [place]

## 2020-07-27 DIAGNOSIS — N1831 Chronic kidney disease, stage 3a: Secondary | ICD-10-CM | POA: Diagnosis not present

## 2020-07-27 DIAGNOSIS — E039 Hypothyroidism, unspecified: Secondary | ICD-10-CM | POA: Diagnosis not present

## 2020-07-27 DIAGNOSIS — D529 Folate deficiency anemia, unspecified: Secondary | ICD-10-CM | POA: Diagnosis not present

## 2020-07-27 DIAGNOSIS — D519 Vitamin B12 deficiency anemia, unspecified: Secondary | ICD-10-CM | POA: Diagnosis not present

## 2020-07-27 DIAGNOSIS — E782 Mixed hyperlipidemia: Secondary | ICD-10-CM | POA: Diagnosis not present

## 2020-07-27 DIAGNOSIS — I482 Chronic atrial fibrillation, unspecified: Secondary | ICD-10-CM | POA: Diagnosis not present

## 2020-07-27 DIAGNOSIS — D649 Anemia, unspecified: Secondary | ICD-10-CM | POA: Diagnosis not present

## 2020-07-27 DIAGNOSIS — E7849 Other hyperlipidemia: Secondary | ICD-10-CM | POA: Diagnosis not present

## 2020-07-27 DIAGNOSIS — E1122 Type 2 diabetes mellitus with diabetic chronic kidney disease: Secondary | ICD-10-CM | POA: Diagnosis not present

## 2020-07-31 ENCOUNTER — Other Ambulatory Visit: Payer: Self-pay | Admitting: *Deleted

## 2020-07-31 MED ORDER — HYDROCHLOROTHIAZIDE 25 MG PO TABS: 25.0000 mg | ORAL_TABLET | Freq: Every day | ORAL | 1 refills | Status: DC

## 2020-08-02 DIAGNOSIS — E7849 Other hyperlipidemia: Secondary | ICD-10-CM | POA: Diagnosis not present

## 2020-08-02 DIAGNOSIS — I10 Essential (primary) hypertension: Secondary | ICD-10-CM | POA: Diagnosis not present

## 2020-08-02 DIAGNOSIS — E1122 Type 2 diabetes mellitus with diabetic chronic kidney disease: Secondary | ICD-10-CM | POA: Diagnosis not present

## 2020-08-02 DIAGNOSIS — Z1389 Encounter for screening for other disorder: Secondary | ICD-10-CM | POA: Diagnosis not present

## 2020-08-02 DIAGNOSIS — J449 Chronic obstructive pulmonary disease, unspecified: Secondary | ICD-10-CM | POA: Diagnosis not present

## 2020-08-02 DIAGNOSIS — Z1331 Encounter for screening for depression: Secondary | ICD-10-CM | POA: Diagnosis not present

## 2020-08-02 DIAGNOSIS — Z23 Encounter for immunization: Secondary | ICD-10-CM | POA: Diagnosis not present

## 2020-08-02 DIAGNOSIS — I5032 Chronic diastolic (congestive) heart failure: Secondary | ICD-10-CM | POA: Diagnosis not present

## 2020-08-09 DIAGNOSIS — Z461 Encounter for fitting and adjustment of hearing aid: Secondary | ICD-10-CM | POA: Diagnosis not present

## 2020-08-09 DIAGNOSIS — H903 Sensorineural hearing loss, bilateral: Secondary | ICD-10-CM | POA: Diagnosis not present

## 2020-09-01 ENCOUNTER — Other Ambulatory Visit: Payer: Self-pay | Admitting: Cardiology

## 2020-09-03 NOTE — Telephone Encounter (Signed)
Prescription refill request for Eliquis received. Indication: Atrial fib Last office visit: 06/12/20  A. Leonides Sake NP Scr: 1.21 on 06/12/20 Age:  85 Weight: 98.4kg  Based on above findings Eliquis 5mg  twice daily is the appropriate dose.  Refill approved.

## 2020-11-14 DIAGNOSIS — Z23 Encounter for immunization: Secondary | ICD-10-CM | POA: Diagnosis not present

## 2020-11-30 DIAGNOSIS — E119 Type 2 diabetes mellitus without complications: Secondary | ICD-10-CM | POA: Diagnosis not present

## 2020-11-30 DIAGNOSIS — E7849 Other hyperlipidemia: Secondary | ICD-10-CM | POA: Diagnosis not present

## 2020-11-30 DIAGNOSIS — D529 Folate deficiency anemia, unspecified: Secondary | ICD-10-CM | POA: Diagnosis not present

## 2020-11-30 DIAGNOSIS — I10 Essential (primary) hypertension: Secondary | ICD-10-CM | POA: Diagnosis not present

## 2020-11-30 DIAGNOSIS — D519 Vitamin B12 deficiency anemia, unspecified: Secondary | ICD-10-CM | POA: Diagnosis not present

## 2020-11-30 DIAGNOSIS — E782 Mixed hyperlipidemia: Secondary | ICD-10-CM | POA: Diagnosis not present

## 2020-11-30 DIAGNOSIS — N1831 Chronic kidney disease, stage 3a: Secondary | ICD-10-CM | POA: Diagnosis not present

## 2020-11-30 DIAGNOSIS — D649 Anemia, unspecified: Secondary | ICD-10-CM | POA: Diagnosis not present

## 2020-12-03 DIAGNOSIS — J449 Chronic obstructive pulmonary disease, unspecified: Secondary | ICD-10-CM | POA: Diagnosis not present

## 2020-12-03 DIAGNOSIS — K7581 Nonalcoholic steatohepatitis (NASH): Secondary | ICD-10-CM | POA: Diagnosis not present

## 2020-12-03 DIAGNOSIS — R809 Proteinuria, unspecified: Secondary | ICD-10-CM | POA: Diagnosis not present

## 2020-12-03 DIAGNOSIS — I4891 Unspecified atrial fibrillation: Secondary | ICD-10-CM | POA: Diagnosis not present

## 2020-12-03 DIAGNOSIS — F331 Major depressive disorder, recurrent, moderate: Secondary | ICD-10-CM | POA: Diagnosis not present

## 2020-12-03 DIAGNOSIS — G4733 Obstructive sleep apnea (adult) (pediatric): Secondary | ICD-10-CM | POA: Diagnosis not present

## 2020-12-03 DIAGNOSIS — E7849 Other hyperlipidemia: Secondary | ICD-10-CM | POA: Diagnosis not present

## 2020-12-03 DIAGNOSIS — Z1212 Encounter for screening for malignant neoplasm of rectum: Secondary | ICD-10-CM | POA: Diagnosis not present

## 2020-12-03 DIAGNOSIS — E782 Mixed hyperlipidemia: Secondary | ICD-10-CM | POA: Diagnosis not present

## 2020-12-03 DIAGNOSIS — L400 Psoriasis vulgaris: Secondary | ICD-10-CM | POA: Diagnosis not present

## 2020-12-03 DIAGNOSIS — I5032 Chronic diastolic (congestive) heart failure: Secondary | ICD-10-CM | POA: Diagnosis not present

## 2020-12-03 DIAGNOSIS — E039 Hypothyroidism, unspecified: Secondary | ICD-10-CM | POA: Diagnosis not present

## 2020-12-03 DIAGNOSIS — E1122 Type 2 diabetes mellitus with diabetic chronic kidney disease: Secondary | ICD-10-CM | POA: Diagnosis not present

## 2020-12-03 DIAGNOSIS — I10 Essential (primary) hypertension: Secondary | ICD-10-CM | POA: Diagnosis not present

## 2020-12-03 DIAGNOSIS — J301 Allergic rhinitis due to pollen: Secondary | ICD-10-CM | POA: Diagnosis not present

## 2020-12-03 DIAGNOSIS — J9611 Chronic respiratory failure with hypoxia: Secondary | ICD-10-CM | POA: Diagnosis not present

## 2020-12-05 DIAGNOSIS — Z20822 Contact with and (suspected) exposure to covid-19: Secondary | ICD-10-CM | POA: Diagnosis not present

## 2020-12-10 ENCOUNTER — Ambulatory Visit (INDEPENDENT_AMBULATORY_CARE_PROVIDER_SITE_OTHER): Payer: Medicare Other | Admitting: Cardiology

## 2020-12-10 ENCOUNTER — Encounter: Payer: Self-pay | Admitting: Cardiology

## 2020-12-10 ENCOUNTER — Other Ambulatory Visit: Payer: Self-pay

## 2020-12-10 VITALS — BP 190/98 | HR 102 | Ht 64.0 in | Wt 217.0 lb

## 2020-12-10 DIAGNOSIS — I4821 Permanent atrial fibrillation: Secondary | ICD-10-CM | POA: Diagnosis not present

## 2020-12-10 DIAGNOSIS — I10 Essential (primary) hypertension: Secondary | ICD-10-CM
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
## 2020-12-10 NOTE — Patient Instructions (Addendum)
Medication Instructions:  Continue all current medications.   Labwork: none  Testing/Procedures: none  Follow-Up: 6 months   Any Other Special Instructions Will Be Listed Below (If Applicable).   If you need a refill on your cardiac medications before your next appointment, please call your pharmacy.  

## 2020-12-13 ENCOUNTER — Encounter: Payer: Self-pay | Admitting: *Deleted

## 2020-12-14 DIAGNOSIS — Z20822 Contact with and (suspected) exposure to covid-19: Secondary | ICD-10-CM | POA: Diagnosis not present

## 2021-01-08 DIAGNOSIS — Z20822 Contact with and (suspected) exposure to covid-19: Secondary | ICD-10-CM | POA: Diagnosis not present

## 2021-01-30 ENCOUNTER — Other Ambulatory Visit: Payer: Self-pay | Admitting: Cardiology

## 2021-01-30 ENCOUNTER — Other Ambulatory Visit: Payer: Self-pay | Admitting: Family Medicine

## 2021-01-30 NOTE — Telephone Encounter (Signed)
Prescription refill request for Eliquis received. Indication: afib  Last office visit: [place], 12/10/2020 Scr: 1.14, 12/13/2020 Age: 85 yo  Weight: 98.4 kg   Refill sent.

## 2021-02-07 DIAGNOSIS — Z20822 Contact with and (suspected) exposure to covid-19: Secondary | ICD-10-CM | POA: Diagnosis not present

## 2021-02-13 ENCOUNTER — Telehealth: Payer: Self-pay | Admitting: Cardiology

## 2021-02-13 MED ORDER — BISOPROLOL FUMARATE 10 MG PO TABS: 10.0000 mg | ORAL_TABLET | Freq: Every day | ORAL | 3 refills | Status: DC

## 2021-02-13 NOTE — Telephone Encounter (Signed)
*  STAT* If patient is at the pharmacy, call can be transferred to refill team.   1. Which medications need to be refilled? (please list name of each medication and dose if known)  bisoprolol (ZEBETA) 10 MG tablet  2. Which pharmacy/location (including street and city if local pharmacy) is medication to be sent to? [facility], [place]  3. Do they need a 30 day or 90 day supply? 60 with refills  Daughter said that [facility] was not able to automatically refill this rx. She was not sure what Dr. Harl Bowie would need to do to fix that .

## 2021-02-13 NOTE — Telephone Encounter (Signed)
Completed.

## 2021-03-11 DIAGNOSIS — Z20822 Contact with and (suspected) exposure to covid-19: Secondary | ICD-10-CM | POA: Diagnosis not present

## 2021-03-27 DIAGNOSIS — E1122 Type 2 diabetes mellitus with diabetic chronic kidney disease: Secondary | ICD-10-CM | POA: Diagnosis not present

## 2021-03-27 DIAGNOSIS — D519 Vitamin B12 deficiency anemia, unspecified: Secondary | ICD-10-CM | POA: Diagnosis not present

## 2021-03-27 DIAGNOSIS — N183 Chronic kidney disease, stage 3 unspecified: Secondary | ICD-10-CM | POA: Diagnosis not present

## 2021-03-27 DIAGNOSIS — E039 Hypothyroidism, unspecified: Secondary | ICD-10-CM | POA: Diagnosis not present

## 2021-03-27 DIAGNOSIS — E782 Mixed hyperlipidemia: Secondary | ICD-10-CM | POA: Diagnosis not present

## 2021-03-27 DIAGNOSIS — E7849 Other hyperlipidemia: Secondary | ICD-10-CM | POA: Diagnosis not present

## 2021-04-01 DIAGNOSIS — E1122 Type 2 diabetes mellitus with diabetic chronic kidney disease: Secondary | ICD-10-CM | POA: Diagnosis not present

## 2021-04-01 DIAGNOSIS — I10 Essential (primary) hypertension: Secondary | ICD-10-CM | POA: Diagnosis not present

## 2021-04-01 DIAGNOSIS — J449 Chronic obstructive pulmonary disease, unspecified: Secondary | ICD-10-CM | POA: Diagnosis not present

## 2021-04-01 DIAGNOSIS — E039 Hypothyroidism, unspecified: Secondary | ICD-10-CM | POA: Diagnosis not present

## 2021-04-01 DIAGNOSIS — I5032 Chronic diastolic (congestive) heart failure: Secondary | ICD-10-CM | POA: Diagnosis not present

## 2021-04-01 DIAGNOSIS — J9611 Chronic respiratory failure with hypoxia: Secondary | ICD-10-CM | POA: Diagnosis not present

## 2021-04-01 DIAGNOSIS — I4891 Unspecified atrial fibrillation: Secondary | ICD-10-CM | POA: Diagnosis not present

## 2021-04-01 DIAGNOSIS — R809 Proteinuria, unspecified: Secondary | ICD-10-CM | POA: Diagnosis not present

## 2021-04-12 DIAGNOSIS — Z20822 Contact with and (suspected) exposure to covid-19: Secondary | ICD-10-CM | POA: Diagnosis not present

## 2021-05-15 DIAGNOSIS — H353133 Nonexudative age-related macular degeneration, bilateral, advanced atrophic without subfoveal involvement: Secondary | ICD-10-CM | POA: Diagnosis not present

## 2021-05-15 DIAGNOSIS — H25813 Combined forms of age-related cataract, bilateral: Secondary | ICD-10-CM | POA: Diagnosis not present

## 2021-06-17 IMAGING — DX DG CHEST 1V PORT
1 series · 1 of 1 positions shown · non-contrast
Comparison: Radiograph 09/26/2019

CLINICAL DATA: Shortness of breath.

EXAM:
PORTABLE CHEST 1 VIEW

[chest]
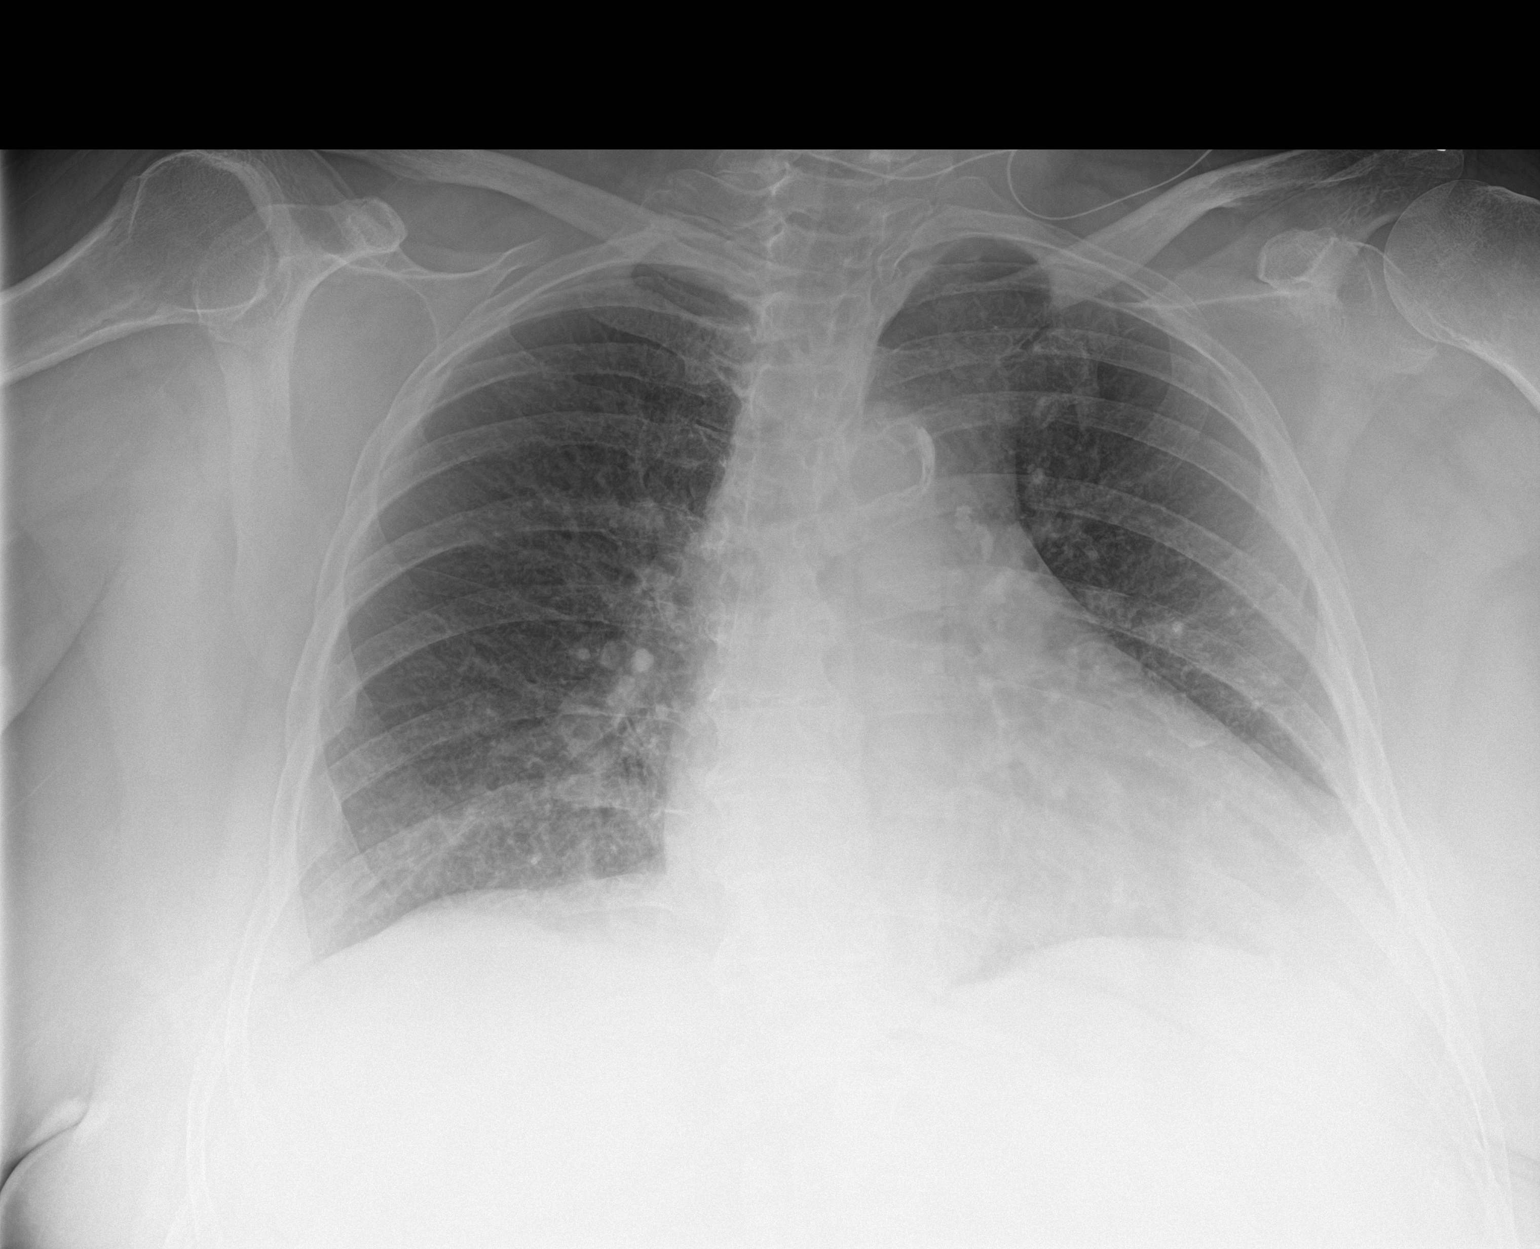

[1 of 1 positions shown; findings below may reference images not displayed]

FINDINGS: Mild cardiomegaly likely accentuated by portable AP technique.
Aortic atherosclerosis. Stable chronic peribronchial thickening and
interstitial prominence. No confluent airspace disease, pleural
effusion, or pneumothorax. Remote right rib fractures again seen. No
acute osseous abnormalities.
IMPRESSION: Chronic bronchitic change and interstitial prominence. No
superimposed acute abnormality.

## 2021-06-20 ENCOUNTER — Ambulatory Visit (INDEPENDENT_AMBULATORY_CARE_PROVIDER_SITE_OTHER): Payer: Medicare Other | Admitting: Cardiology

## 2021-06-20 ENCOUNTER — Encounter: Payer: Self-pay | Admitting: Cardiology

## 2021-06-20 VITALS — BP 140/80 | HR 91 | Ht 65.0 in | Wt 223.0 lb

## 2021-06-20 DIAGNOSIS — E782 Mixed hyperlipidemia: Secondary | ICD-10-CM | POA: Diagnosis not present

## 2021-06-20 DIAGNOSIS — I4821 Permanent atrial fibrillation: Secondary | ICD-10-CM

## 2021-06-20 DIAGNOSIS — I10 Essential (primary) hypertension: Secondary | ICD-10-CM

## 2021-06-20 MED ORDER — BISOPROLOL FUMARATE 10 MG PO TABS: 15.0000 mg | ORAL_TABLET | Freq: Every day | ORAL | 3 refills | Status: DC

## 2021-06-20 NOTE — Patient Instructions (Signed)
Medication Instructions:  ?Increase Bisoprolol to '15mg'$  daily ?Continue all other medications.    ? ?Labwork: ?none ? ?Testing/Procedures: ?none ? ?Follow-Up: ?6 months  ? ?Any Other Special Instructions Will Be Listed Below (If Applicable). ? ? ?If you need a refill on your cardiac medications before your next appointment, please call your pharmacy. ? ?

## 2021-06-20 NOTE — Addendum Note (Signed)
Addended by: Laurine Blazer on: 06/20/2021 02:48 PM ? ? Modules accepted: Orders ? ?

## 2021-06-20 NOTE — Progress Notes (Signed)
? ? ? ?Clinical Summary ?[name] is a 86 y.o.female seen today for follow up of the following medical problems.  ?  ?1. Permanent afib ?- afib noted by 01/26/2019 preop ekg ?- no recent palpitations. Has intermittent SOB/DOE at times, some days worst then other.  ?- recent monitor showed rate controlled afib/aflutter ?  ?- issues with afib with RVR during 10/2019 clinic visit, referred to [facility] ?- admitted 10/24/19 for tikosyn initiation. Loaded with attempted cardioversion 10/27/19 but failed ?- at EP f/u 8/26 plans were for repeat cardioversion, if fails would consider amiodarone (minimal COPD according to pulm note). Appears admitted with anemia prior to cardioversion, EKG during that admission 11/04/19 showed NSR ?- dilt stopped 10/27/19 after tikosyn loading, remained on bisoprolol ?  ?  ? - admission 12/2019 with COPD exacerbation and UTI, issues with afib with RVR.  ?- tikosyn was stopped due to labile renal function, Cr up to 1.9 on admission ?  ? - seen by EP. Poor ablation candidate. Started on amio that visit ?  ?- from last EP note failed amio, deemed as having permanent afib with plans for just rate control. ?- no recent palpitations. HRs at home 50-60s up to 100.  ?  ?  ?- mild palpitations ?- no bleeding on eliquis ?-no recent issues.  ? ?2. Anemia ?- admitted 10/2019 with symptomatic anemia. ?Hgb down to 6.5, given 1 unit prbcs ?- has not been a recurrent issue ?  ?  ?3. Hard of hearing ?  ?4. Chronic diastolic HF ?- she is on HCTZ ?- no recent edema ?  ?5. HTN ?- home bp 140s/80s ? ?6. Hyperlipidemia ?11/2020 TC 137 TG 250 HDL 54 LDL 44 ? ?Past Medical History:  ?Diagnosis Date  ? Allergy   ? seasonal allergies  ? Anemia   ? IDA- on iron supplement  ? Anxiety   ? Atrial fibrillation ([place])   ? Blood transfusion without reported diagnosis   ? 2021- due to IDA/ low hemo (7.3)  ? Cancer [facility]) 1976  ? bilaterl thyroid ca  ? Diabetes mellitus without complication ([place])   ? on meds  ? GERD  (gastroesophageal reflux disease)   ? on meds---OTC  ? Headache(784.0)   ? hx migraines  ? Hyperlipidemia   ? on meds  ? Hypertension   ? on meds  ? Hypothyroidism   ? thyroidectomy in 1976- on meds at  this time  ? On supplemental oxygen therapy   ? 2 L at night  ? Shortness of breath   ? Sinus congestion   ? Sleep apnea   ? study done in [place], [place], use CPAP----with home O2 concentrater at night with CPAP  ? ? ? ?No Known Allergies ? ? ?Current Outpatient Medications  ?Medication Sig Dispense Refill  ? acetaminophen (TYLENOL) 500 MG tablet Take 500 mg by mouth every 6 (six) hours as needed (for pain.).    ? bisoprolol (ZEBETA) 10 MG tablet Take 1 tablet (10 mg total) by mouth daily. 90 tablet 3  ? cetirizine (ZYRTEC) 10 MG tablet Take 10 mg by mouth daily as needed for allergies.     ? Cholecalciferol (VITAMIN D3) 50 MCG (2000 UT) capsule Take 2,000 Units by mouth daily.    ? ELIQUIS 5 MG TABS tablet TAKE 1 TABLET TWICE A DAY 180 tablet 1  ? glipiZIDE (GLUCOTROL XL) 10 MG 24 hr tablet Take 10 mg by mouth daily. (Patient not taking: Reported on 12/10/2020)    ?  GUAIFENESIN 1200 PO Take 1,200 mg by mouth 2 (two) times daily as needed (sinus congestion.).    ? hydrochlorothiazide (HYDRODIURIL) 25 MG tablet TAKE 1 TABLET DAILY 90 tablet 3  ? iron polysaccharides (NU-IRON) 150 MG capsule Take 1 capsule (150 mg total) by mouth daily. (Patient not taking: Reported on 12/10/2020) 30 capsule 0  ? levothyroxine (SYNTHROID) 112 MCG tablet Take 112 mcg by mouth daily before breakfast.     ? lisinopril (ZESTRIL) 10 MG tablet Take 10 mg by mouth daily.    ? melatonin 5 MG TABS Take 2.5-5 mg by mouth at bedtime as needed (sleep).     ? metFORMIN (GLUCOPHAGE-XR) 500 MG 24 hr tablet Take 1,000 mg by mouth daily with supper.    ? Multiple Vitamins-Minerals (MULTIVITAMIN WITH MINERALS) tablet Take 1 tablet by mouth daily.    ? omeprazole (PRILOSEC) 20 MG capsule Take 20 mg by mouth daily as needed (acid reflux/indigestion.).    ?  simvastatin (ZOCOR) 40 MG tablet Take 20 mg by mouth daily with supper.     ? triamcinolone cream (KENALOG) 0.1 % Apply 1 application topically 2 (two) times daily as needed (psoriasis).    ? ?No current facility-administered medications for this visit.  ? ? ? ?Past Surgical History:  ?Procedure Laterality Date  ? APPENDECTOMY    ? CARDIOVERSION N/A 10/27/2019  ? Procedure: CARDIOVERSION;  Surgeon: [name], MD;  Location: [facility] ENDOSCOPY;  Service: Cardiovascular;  Laterality: N/A;  ? CARDIOVERSION N/A 04/25/2020  ? Procedure: CARDIOVERSION;  Surgeon: Freada Bergeron, MD;  Location: [facility] ENDOSCOPY;  Service: Cardiovascular;  Laterality: N/A;  ? CHOLECYSTECTOMY    ? COLONOSCOPY WITH PROPOFOL N/A 01/09/2020  ? Procedure: COLONOSCOPY WITH PROPOFOL;  Surgeon: Doran Stabler, MD;  Location: [facility] ENDOSCOPY;  Service: Gastroenterology;  Laterality: N/A;  ? ESOPHAGOGASTRODUODENOSCOPY (EGD) WITH PROPOFOL N/A 01/09/2020  ? Procedure: ESOPHAGOGASTRODUODENOSCOPY (EGD) WITH PROPOFOL;  Surgeon: Doran Stabler, MD;  Location: [facility] ENDOSCOPY;  Service: Gastroenterology;  Laterality: N/A;  ? HEMOSTASIS CLIP PLACEMENT  01/09/2020  ? Procedure: HEMOSTASIS CLIP PLACEMENT;  Surgeon: Doran Stabler, MD;  Location: [facility] ENDOSCOPY;  Service: Gastroenterology;;  ? OPEN REDUCTION INTERNAL FIXATION (ORIF) TIBIA/FIBULA FRACTURE Right 11/03/2013  ? Procedure: OPEN REDUCTION INTERNAL FIXATION (ORIF) RIGHT TIBIA/FIBULA PILON FRACTURE;  Surgeon: Wylene Simmer, MD;  Location: [facility];  Service: Orthopedics;  Laterality: Right;  ? OVARIAN CYST SURGERY Left   ? age 77  ? POLYPECTOMY  01/09/2020  ? Procedure: POLYPECTOMY;  Surgeon: Doran Stabler, MD;  Location: [facility] ENDOSCOPY;  Service: Gastroenterology;;  ? THYROIDECTOMY Bilateral 1976  ? TUBAL LIGATION    ? ? ? ?No Known Allergies ? ? ? ?Family History  ?Problem Relation Age of Onset  ? Stroke Mother   ?     brain hemmorhage  ? Diabetes Mother   ? Heart disease Mother   ? Thyroid  disease Mother   ? Arrhythmia Mother   ? Stroke Father   ? Heart attack Father   ?     blood clot in brain  ? Stroke Sister   ? Cancer Sister   ? Stomach cancer Brother 38  ? Hypertension Brother   ? Esophageal cancer Brother 21  ? Stroke Brother   ? Hypertension Brother   ? Arrhythmia Sister   ? Hypertension Sister   ? Colon polyps Neg Hx   ? Colon cancer Neg Hx   ? Rectal cancer Neg Hx   ? ? ? ?  Social History ?[name] reports that she quit smoking about 9 years ago. Her smoking use included cigarettes. She has a 90.00 pack-year smoking history. She has never used smokeless tobacco. ?[name] reports no history of alcohol use. ? ? ?Review of Systems ?CONSTITUTIONAL: No weight loss, fever, chills, weakness or fatigue.  ?HEENT: Eyes: No visual loss, blurred vision, double vision or yellow sclerae.No hearing loss, sneezing, congestion, runny nose or sore throat.  ?SKIN: No rash or itching.  ?CARDIOVASCULAR: per hpi ?RESPIRATORY: No shortness of breath, cough or sputum.  ?GASTROINTESTINAL: No anorexia, nausea, vomiting or diarrhea. No abdominal pain or blood.  ?GENITOURINARY: No burning on urination, no polyuria ?NEUROLOGICAL: No headache, dizziness, syncope, paralysis, ataxia, numbness or tingling in the extremities. No change in bowel or bladder control.  ?MUSCULOSKELETAL: No muscle, back pain, joint pain or stiffness.  ?LYMPHATICS: No enlarged nodes. No history of splenectomy.  ?PSYCHIATRIC: No history of depression or anxiety.  ?ENDOCRINOLOGIC: No reports of sweating, cold or heat intolerance. No polyuria or polydipsia.  ?. ? ? ?Physical Examination ?Today's Vitals  ? 06/20/21 1411  ?BP: 140/88  ?Pulse: 91  ?SpO2: 95%  ?Weight: 223 lb (101.2 kg)  ?Height: '5\' 5"'$  (1.651 m)  ? ?Body mass index is 37.11 kg/m?. ? ?Gen: resting comfortably, no acute distress ?HEENT: no scleral icterus, pupils equal round and reactive, no palptable cervical adenopathy,  ?CV: RRR, no m/r/g no jvd ?Resp: Clear to auscultation  bilaterally ?GI: abdomen is soft, non-tender, non-distended, normal bowel sounds, no hepatosplenomegaly ?MSK: extremities are warm, no edema.  ?Skin: warm, no rash ?Neuro:  no focal deficits ?Psych: appropriate affect ? ? ?Diagnostic

## 2021-06-21 DIAGNOSIS — H353124 Nonexudative age-related macular degeneration, left eye, advanced atrophic with subfoveal involvement: Secondary | ICD-10-CM | POA: Diagnosis not present

## 2021-06-21 DIAGNOSIS — H2513 Age-related nuclear cataract, bilateral: Secondary | ICD-10-CM | POA: Diagnosis not present

## 2021-06-21 DIAGNOSIS — H353113 Nonexudative age-related macular degeneration, right eye, advanced atrophic without subfoveal involvement: Secondary | ICD-10-CM | POA: Diagnosis not present

## 2021-06-21 DIAGNOSIS — H43811 Vitreous degeneration, right eye: Secondary | ICD-10-CM | POA: Diagnosis not present

## 2021-07-02 DIAGNOSIS — H353113 Nonexudative age-related macular degeneration, right eye, advanced atrophic without subfoveal involvement: Secondary | ICD-10-CM | POA: Diagnosis not present

## 2021-07-09 ENCOUNTER — Other Ambulatory Visit: Payer: Self-pay | Admitting: Cardiology

## 2021-07-10 NOTE — Telephone Encounter (Signed)
Prescription refill request for Eliquis received. ?Indication: afib  ?Last office visit: [place], 06/20/2021 ?Scr: 1.14, 11/30/2020 ?Age: 86 yo  ?Weight: 101.2 kg  ? ?Refill sent.  ?

## 2021-07-19 DIAGNOSIS — I10 Essential (primary) hypertension: Secondary | ICD-10-CM | POA: Diagnosis not present

## 2021-07-19 DIAGNOSIS — N1831 Chronic kidney disease, stage 3a: Secondary | ICD-10-CM | POA: Diagnosis not present

## 2021-07-19 DIAGNOSIS — E039 Hypothyroidism, unspecified: Secondary | ICD-10-CM | POA: Diagnosis not present

## 2021-07-19 DIAGNOSIS — E1122 Type 2 diabetes mellitus with diabetic chronic kidney disease: Secondary | ICD-10-CM | POA: Diagnosis not present

## 2021-07-19 DIAGNOSIS — K7581 Nonalcoholic steatohepatitis (NASH): Secondary | ICD-10-CM | POA: Diagnosis not present

## 2021-07-19 DIAGNOSIS — J449 Chronic obstructive pulmonary disease, unspecified: Secondary | ICD-10-CM | POA: Diagnosis not present

## 2021-07-21 IMAGING — US US RENAL
1 series · 14 of 25 positions shown · non-contrast
Comparison: None.

CLINICAL DATA: UTI.

EXAM:
RENAL / URINARY TRACT ULTRASOUND COMPLETE

[Series 1: us renal · 14 of 28 slices shown]
[im 1/28]
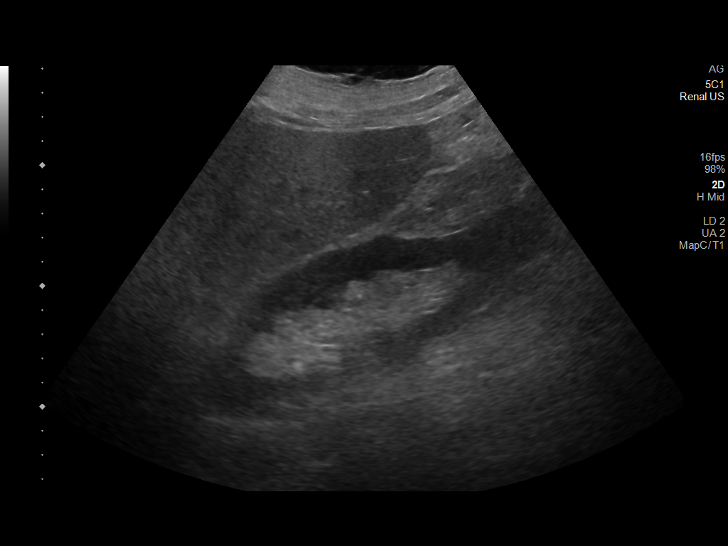
[im 3/28]
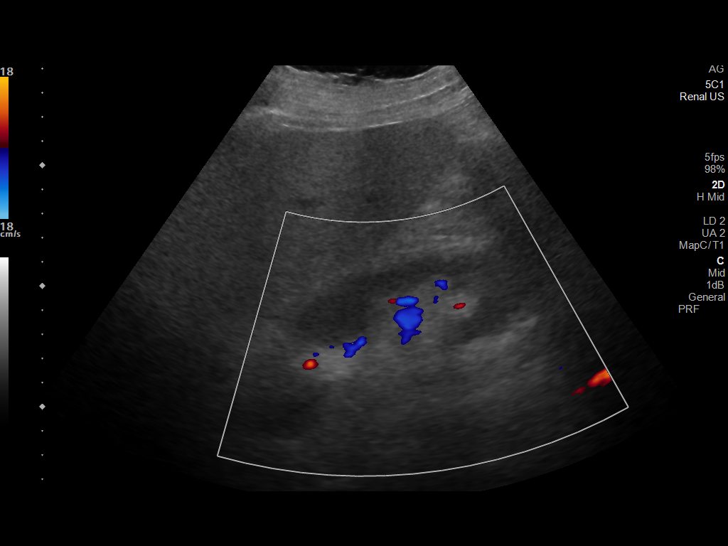
[im 5/28]
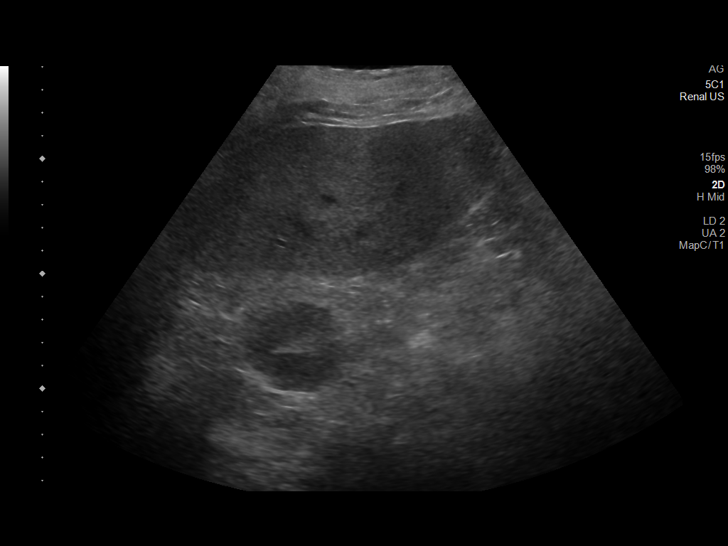
[im 7/28]
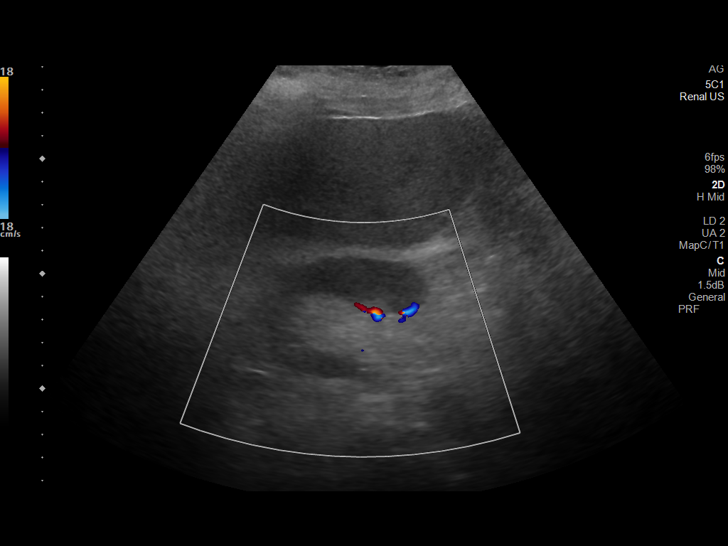
[im 10/28]
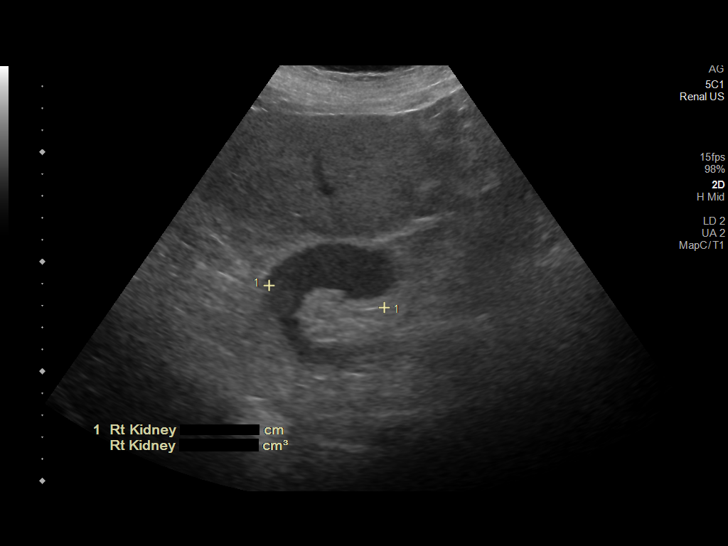
[im 11/28]
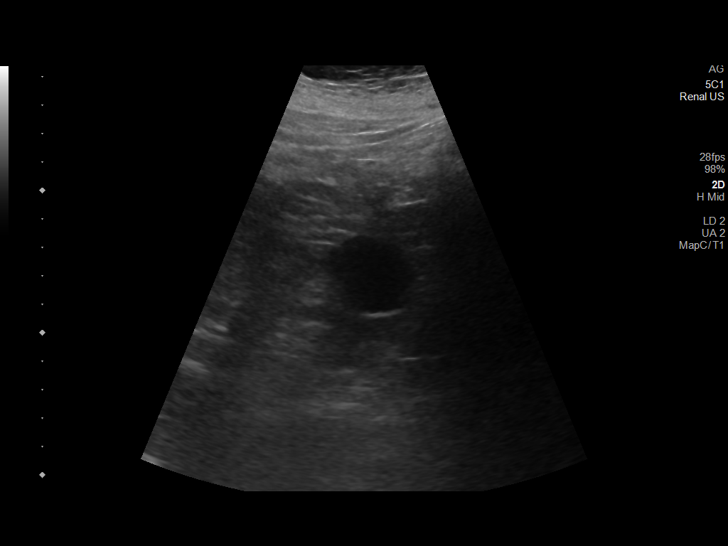
[im 13/28]
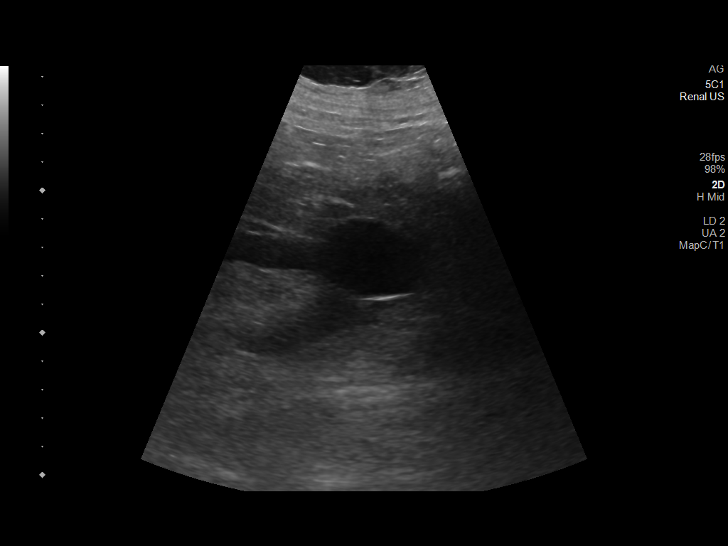
[im 15/28]
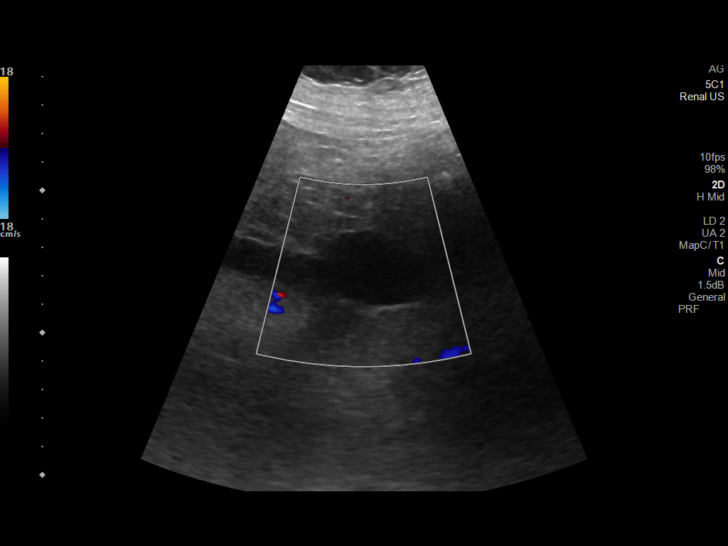
[im 17/28]
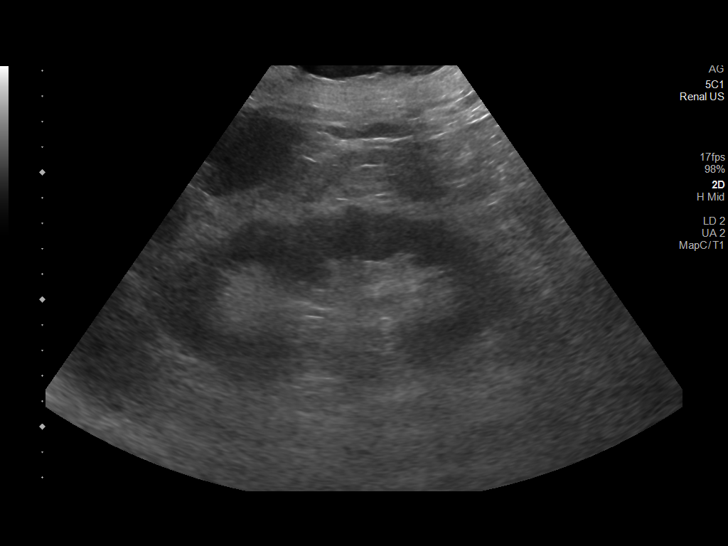
[im 19/28]
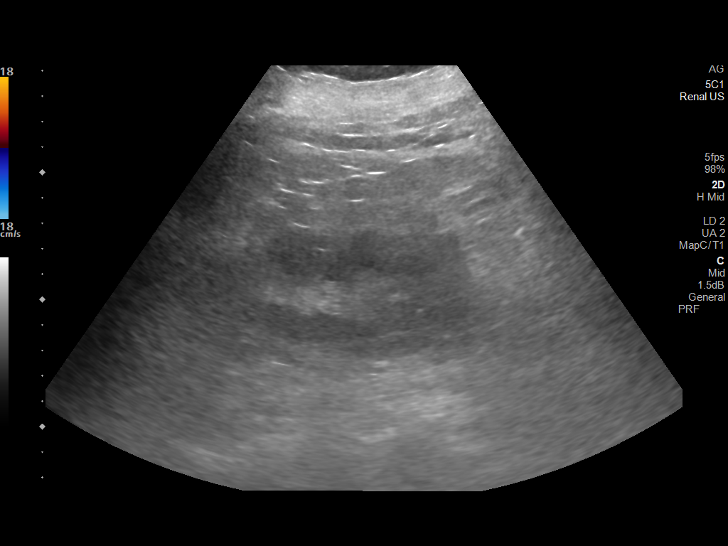
[im 21/28]
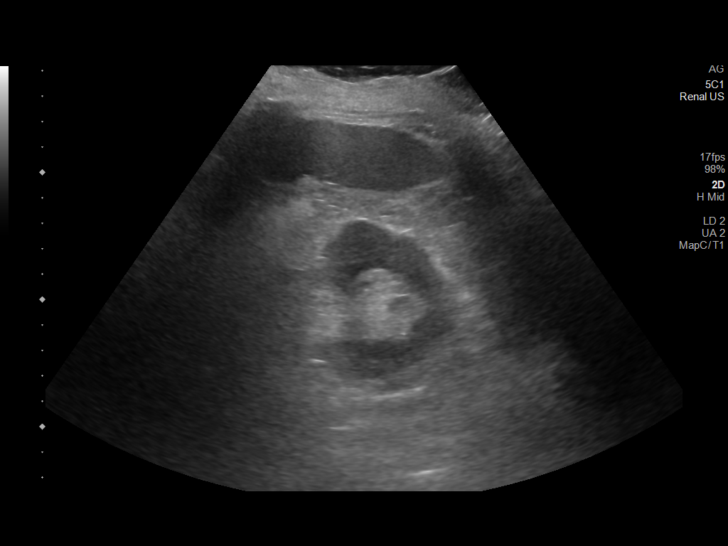
[im 23/28]
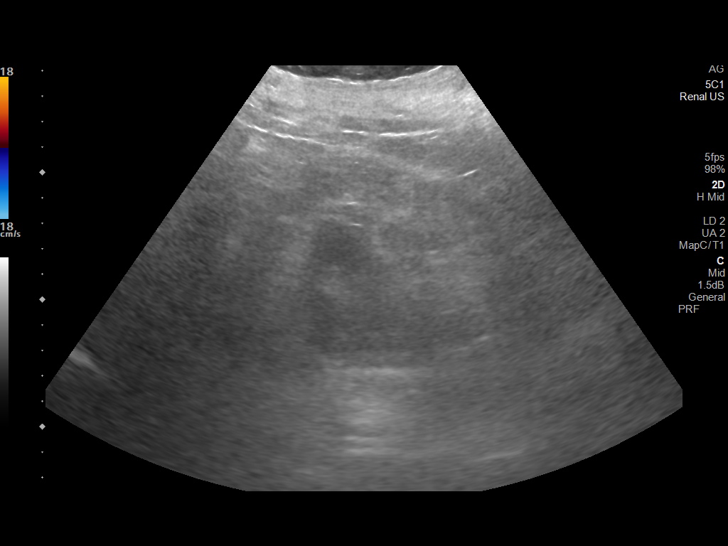
[im 25/28]
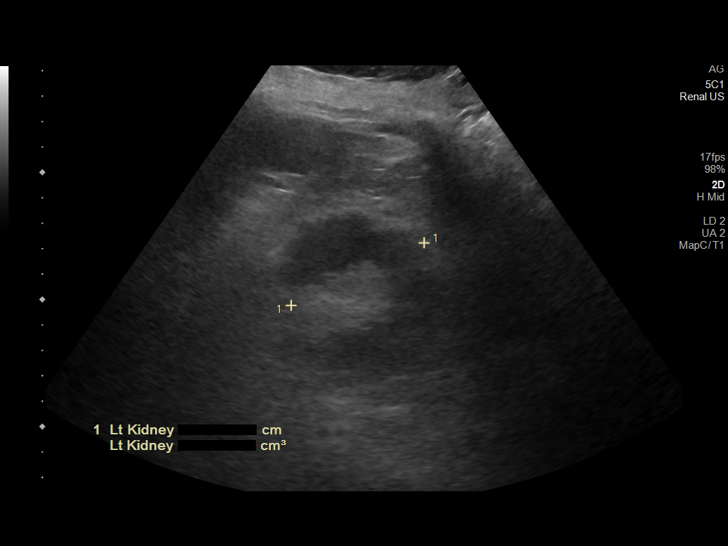
[im 28/28]
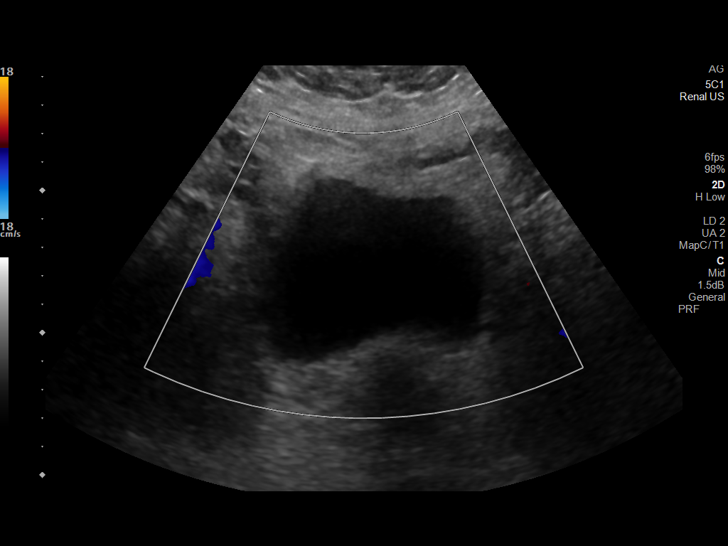

[14 of 25 positions shown; findings below may reference images not displayed]

FINDINGS: Right Kidney:

Renal measurements: 12.3 x 5.0 x 5.4 cm = volume: 174 mL.
Echogenicity within normal limits. There is a 3.3 cm simple cyst
from the inferior kidney. No solid mass or hydronephrosis
visualized. No perirenal fluid collection.

Left Kidney:

Renal measurements: 13.8 x 6.3 x 5.8 cm = volume: 261 mL.
Echogenicity within normal limits. No mass or hydronephrosis
visualized. No perirenal fluid collection.

Bladder:

Appears normal for degree of bladder distention.

Other:

None.
IMPRESSION: Simple cyst in the right kidney. Otherwise unremarkable renal
ultrasound.

## 2021-07-21 IMAGING — DX DG CHEST 1V PORT
1 series · 1 of 1 positions shown · non-contrast
Comparison: 12/07/2019

CLINICAL DATA: Shortness of breath and fever.

EXAM:
PORTABLE CHEST 1 VIEW

[chest ap]
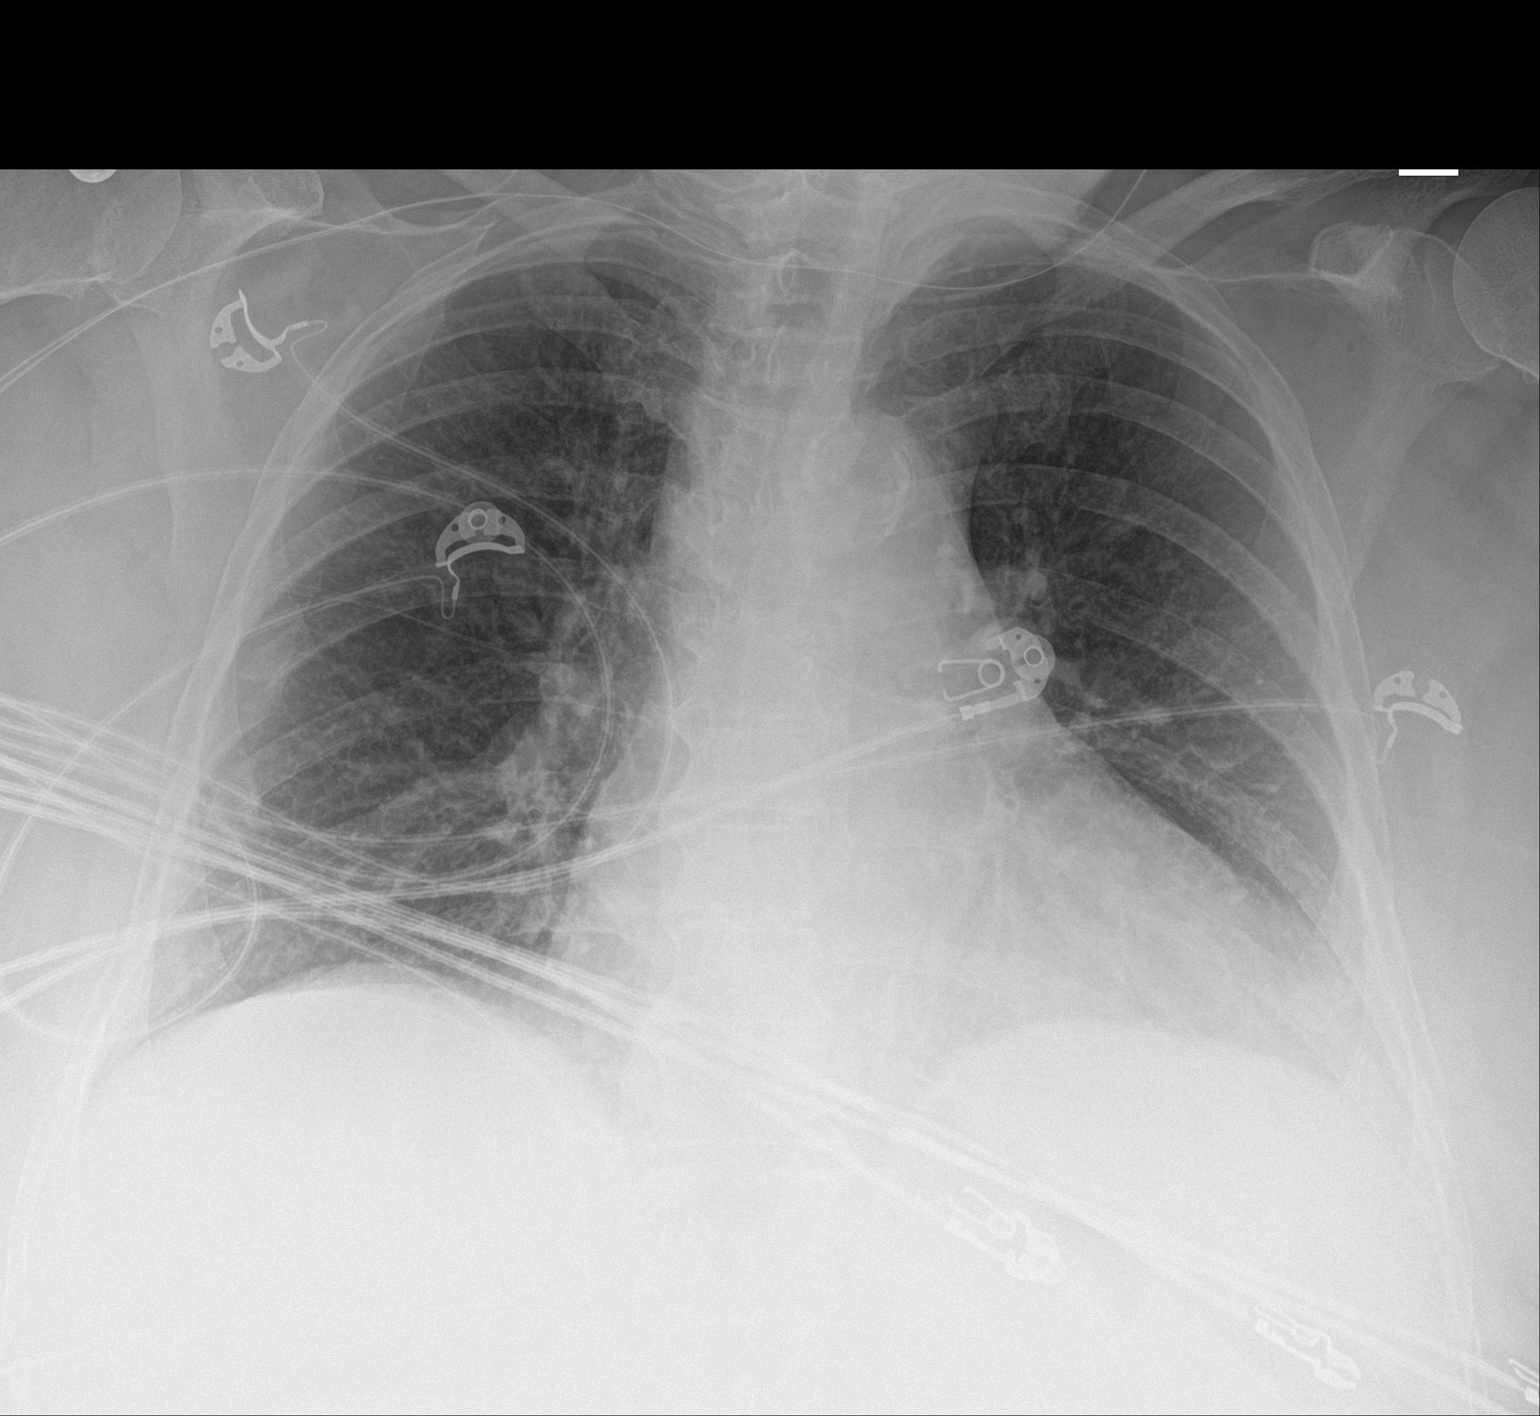

[1 of 1 positions shown; findings below may reference images not displayed]

FINDINGS: No focal consolidation. Similar mild diffuse bronchitic and
interstitial prominence. Low lung volumes. No visible pleural
effusion or pneumothorax. Mild enlargement of the cardiac
silhouette, similar to prior. Calcific atherosclerosis of the aorta
IMPRESSION: 1. No evidence of acute cardiopulmonary abnormality.
2. Similar mild cardiac enlargement and chronic bronchitic and
interstitial prominence.

## 2021-07-22 DIAGNOSIS — H903 Sensorineural hearing loss, bilateral: Secondary | ICD-10-CM | POA: Diagnosis not present

## 2021-07-22 DIAGNOSIS — H6123 Impacted cerumen, bilateral: Secondary | ICD-10-CM | POA: Diagnosis not present

## 2021-07-24 DIAGNOSIS — E1122 Type 2 diabetes mellitus with diabetic chronic kidney disease: Secondary | ICD-10-CM | POA: Diagnosis not present

## 2021-07-24 DIAGNOSIS — J9611 Chronic respiratory failure with hypoxia: Secondary | ICD-10-CM | POA: Diagnosis not present

## 2021-07-24 DIAGNOSIS — I4891 Unspecified atrial fibrillation: Secondary | ICD-10-CM | POA: Diagnosis not present

## 2021-07-24 DIAGNOSIS — R809 Proteinuria, unspecified: Secondary | ICD-10-CM | POA: Diagnosis not present

## 2021-07-24 DIAGNOSIS — H353290 Exudative age-related macular degeneration, unspecified eye, stage unspecified: Secondary | ICD-10-CM | POA: Diagnosis not present

## 2021-07-24 DIAGNOSIS — Z0001 Encounter for general adult medical examination with abnormal findings: Secondary | ICD-10-CM | POA: Diagnosis not present

## 2021-07-24 DIAGNOSIS — I10 Essential (primary) hypertension: Secondary | ICD-10-CM | POA: Diagnosis not present

## 2021-07-24 DIAGNOSIS — I5032 Chronic diastolic (congestive) heart failure: Secondary | ICD-10-CM | POA: Diagnosis not present

## 2021-09-03 DIAGNOSIS — H43811 Vitreous degeneration, right eye: Secondary | ICD-10-CM | POA: Diagnosis not present

## 2021-09-03 DIAGNOSIS — H2513 Age-related nuclear cataract, bilateral: Secondary | ICD-10-CM | POA: Diagnosis not present

## 2021-09-03 DIAGNOSIS — H353124 Nonexudative age-related macular degeneration, left eye, advanced atrophic with subfoveal involvement: Secondary | ICD-10-CM | POA: Diagnosis not present

## 2021-09-03 DIAGNOSIS — H353113 Nonexudative age-related macular degeneration, right eye, advanced atrophic without subfoveal involvement: Secondary | ICD-10-CM | POA: Diagnosis not present

## 2021-09-23 ENCOUNTER — Encounter: Payer: Self-pay | Admitting: Cardiology

## 2021-09-24 ENCOUNTER — Other Ambulatory Visit: Payer: Self-pay | Admitting: *Deleted

## 2021-09-24 MED ORDER — ELIQUIS 5 MG PO TABS: 5.0000 mg | ORAL_TABLET | Freq: Two times a day (BID) | ORAL | 1 refills | Status: DC

## 2021-09-24 NOTE — Telephone Encounter (Signed)
Prescription refill request for Eliquis received. Indication: Atrial Fib Last office visit: 06/20/21  Zandra Abts MD Scr: 1.14 on 11/30/20 Age: 86 Weight:101.2kg  Based on above findings Eliquis '5mg'$  twice daily is the appropriate dose.  Refill approved.

## 2021-10-29 DIAGNOSIS — H353113 Nonexudative age-related macular degeneration, right eye, advanced atrophic without subfoveal involvement: Secondary | ICD-10-CM | POA: Diagnosis not present

## 2021-10-29 DIAGNOSIS — H353124 Nonexudative age-related macular degeneration, left eye, advanced atrophic with subfoveal involvement: Secondary | ICD-10-CM | POA: Diagnosis not present

## 2021-10-29 DIAGNOSIS — H18513 Endothelial corneal dystrophy, bilateral: Secondary | ICD-10-CM | POA: Diagnosis not present

## 2021-10-29 DIAGNOSIS — H43811 Vitreous degeneration, right eye: Secondary | ICD-10-CM | POA: Diagnosis not present

## 2021-11-20 DIAGNOSIS — E1122 Type 2 diabetes mellitus with diabetic chronic kidney disease: Secondary | ICD-10-CM | POA: Diagnosis not present

## 2021-11-20 DIAGNOSIS — E782 Mixed hyperlipidemia: Secondary | ICD-10-CM | POA: Diagnosis not present

## 2021-11-20 DIAGNOSIS — E039 Hypothyroidism, unspecified: Secondary | ICD-10-CM | POA: Diagnosis not present

## 2021-11-20 DIAGNOSIS — R300 Dysuria: Secondary | ICD-10-CM | POA: Diagnosis not present

## 2021-11-20 DIAGNOSIS — E7849 Other hyperlipidemia: Secondary | ICD-10-CM | POA: Diagnosis not present

## 2021-11-20 DIAGNOSIS — D529 Folate deficiency anemia, unspecified: Secondary | ICD-10-CM | POA: Diagnosis not present

## 2021-11-20 DIAGNOSIS — E875 Hyperkalemia: Secondary | ICD-10-CM | POA: Diagnosis not present

## 2021-11-26 DIAGNOSIS — Z1212 Encounter for screening for malignant neoplasm of rectum: Secondary | ICD-10-CM | POA: Diagnosis not present

## 2021-11-26 DIAGNOSIS — E782 Mixed hyperlipidemia: Secondary | ICD-10-CM | POA: Diagnosis not present

## 2021-11-26 DIAGNOSIS — J9611 Chronic respiratory failure with hypoxia: Secondary | ICD-10-CM | POA: Diagnosis not present

## 2021-11-26 DIAGNOSIS — Z23 Encounter for immunization: Secondary | ICD-10-CM | POA: Diagnosis not present

## 2021-11-26 DIAGNOSIS — I10 Essential (primary) hypertension: Secondary | ICD-10-CM | POA: Diagnosis not present

## 2021-11-26 DIAGNOSIS — E039 Hypothyroidism, unspecified: Secondary | ICD-10-CM | POA: Diagnosis not present

## 2021-11-26 DIAGNOSIS — H353290 Exudative age-related macular degeneration, unspecified eye, stage unspecified: Secondary | ICD-10-CM | POA: Diagnosis not present

## 2021-11-26 DIAGNOSIS — K7581 Nonalcoholic steatohepatitis (NASH): Secondary | ICD-10-CM | POA: Diagnosis not present

## 2021-11-26 DIAGNOSIS — L400 Psoriasis vulgaris: Secondary | ICD-10-CM | POA: Diagnosis not present

## 2021-11-26 DIAGNOSIS — R809 Proteinuria, unspecified: Secondary | ICD-10-CM | POA: Diagnosis not present

## 2021-11-26 DIAGNOSIS — F331 Major depressive disorder, recurrent, moderate: Secondary | ICD-10-CM | POA: Diagnosis not present

## 2021-11-26 DIAGNOSIS — I4891 Unspecified atrial fibrillation: Secondary | ICD-10-CM | POA: Diagnosis not present

## 2021-11-26 DIAGNOSIS — I5032 Chronic diastolic (congestive) heart failure: Secondary | ICD-10-CM | POA: Diagnosis not present

## 2021-11-26 DIAGNOSIS — E7849 Other hyperlipidemia: Secondary | ICD-10-CM | POA: Diagnosis not present

## 2021-11-26 DIAGNOSIS — E1122 Type 2 diabetes mellitus with diabetic chronic kidney disease: Secondary | ICD-10-CM | POA: Diagnosis not present

## 2021-11-26 DIAGNOSIS — J301 Allergic rhinitis due to pollen: Secondary | ICD-10-CM | POA: Diagnosis not present

## 2021-11-26 DIAGNOSIS — J449 Chronic obstructive pulmonary disease, unspecified: Secondary | ICD-10-CM | POA: Diagnosis not present

## 2021-11-26 DIAGNOSIS — G4733 Obstructive sleep apnea (adult) (pediatric): Secondary | ICD-10-CM | POA: Diagnosis not present

## 2021-12-24 ENCOUNTER — Encounter: Payer: Self-pay | Admitting: Cardiology

## 2021-12-24 ENCOUNTER — Encounter: Payer: Self-pay | Admitting: *Deleted

## 2021-12-24 ENCOUNTER — Ambulatory Visit: Payer: Medicare Other | Attending: Cardiology | Admitting: Cardiology

## 2021-12-24 VITALS — BP 130/80 | HR 86 | Ht 65.0 in | Wt 227.0 lb

## 2021-12-24 DIAGNOSIS — E782 Mixed hyperlipidemia: Secondary | ICD-10-CM

## 2021-12-24 DIAGNOSIS — I4821 Permanent atrial fibrillation: Secondary | ICD-10-CM | POA: Diagnosis not present

## 2021-12-24 DIAGNOSIS — I10 Essential (primary) hypertension: Secondary | ICD-10-CM

## 2021-12-24 MED ORDER — BISOPROLOL FUMARATE 10 MG PO TABS: 20.0000 mg | ORAL_TABLET | Freq: Every day | ORAL | 3 refills | Status: DC

## 2021-12-24 NOTE — Progress Notes (Signed)
Clinical Summary Miranda Wilkinson is a 86 y.o.female seen today for follow up of the following medical problems.    1. Permanent afib - afib noted by 01/26/2019 preop ekg - issues with afib with RVR during 10/2019 clinic visit, referred to [facility] - admitted 10/24/19 for tikosyn initiation. Loaded with attempted cardioversion 10/27/19 but failed - at EP f/u 8/26 plans were for repeat cardioversion, if fails would consider amiodarone (minimal COPD according to pulm note). Appears admitted with anemia prior to cardioversion, EKG during that admission 11/04/19 showed NSR - dilt stopped 10/27/19 after tikosyn loading, remained on bisoprolol      - admission 12/2019 with COPD exacerbation and UTI, issues with afib with RVR.  - tikosyn was stopped due to labile renal function, Cr up to 1.9 on admission  - seen by EP. Poor ablation candidate. Started on amio that visit   - from last EP note failed amio, deemed as having permanent afib with plans for just rate control.    - occasional palpitaitons.About 2 times a week, lasts 5-10 minutes.  -home HRs 60s, at times 110s.  - no bleeding on eliquis       2. Anemia - admitted 10/2019 with symptomatic anemia. Hgb down to 6.5, given 1 unit prbcs - has not been a recurrent issue     3. Hard of hearing   4. Chronic diastolic HF - she is on HCTZ - denies any recent edema   5. HTN - home bp 120s-130s/70-80s  6. Hyperlipidemia 11/2020 TC 137 TG 250 HDL 54 LDL 44 - recent labs with pcp   Past Medical History:  Diagnosis Date   Allergy    seasonal allergies   Anemia    IDA- on iron supplement   Anxiety    Atrial fibrillation ([facility])    Blood transfusion without reported diagnosis    2021- due to IDA/ low hemo (7.3)   Cancer ([place]) 1976   bilaterl thyroid ca   Diabetes mellitus without complication ([place])    on meds   GERD (gastroesophageal reflux disease)    on meds---OTC   Headache(784.0)    hx migraines   Hyperlipidemia    on  meds   Hypertension    on meds   Hypothyroidism    thyroidectomy in 1976- on meds at  this time   On supplemental oxygen therapy    2 L at night   Shortness of breath    Sinus congestion    Sleep apnea    study done in [place], [place], use CPAP----with home O2 concentrater at night with CPAP     No Known Allergies   Current Outpatient Medications  Medication Sig Dispense Refill   acetaminophen (TYLENOL) 500 MG tablet Take 500 mg by mouth every 6 (six) hours as needed (for pain.).     bisoprolol (ZEBETA) 10 MG tablet Take 1.5 tablets (15 mg total) by mouth daily. 135 tablet 3   cetirizine (ZYRTEC) 10 MG tablet Take 10 mg by mouth daily as needed for allergies.      Cholecalciferol (VITAMIN D3) 50 MCG (2000 UT) capsule Take 2,000 Units by mouth daily.     ELIQUIS 5 MG TABS tablet Take 1 tablet (5 mg total) by mouth 2 (two) times daily. 180 tablet 1   GUAIFENESIN 1200 PO Take 1,200 mg by mouth 2 (two) times daily as needed (sinus congestion.).     hydrochlorothiazide (HYDRODIURIL) 25 MG tablet TAKE 1 TABLET DAILY 90 tablet  3   levothyroxine (SYNTHROID) 112 MCG tablet Take 112 mcg by mouth daily before breakfast.      lisinopril (ZESTRIL) 10 MG tablet Take 10 mg by mouth daily.     melatonin 5 MG TABS Take 2.5-5 mg by mouth at bedtime as needed (sleep).      metFORMIN (GLUCOPHAGE-XR) 500 MG 24 hr tablet Take 1,000 mg by mouth daily with supper.     Multiple Vitamins-Minerals (MULTIVITAMIN WITH MINERALS) tablet Take 1 tablet by mouth daily.     omeprazole (PRILOSEC) 20 MG capsule Take 20 mg by mouth daily as needed (acid reflux/indigestion.).     simvastatin (ZOCOR) 40 MG tablet Take 20 mg by mouth daily with supper.      triamcinolone cream (KENALOG) 0.1 % Apply 1 application topically 2 (two) times daily as needed (psoriasis).     No current facility-administered medications for this visit.     Past Surgical History:  Procedure Laterality Date   APPENDECTOMY     CARDIOVERSION N/A  10/27/2019   Procedure: CARDIOVERSION;  Surgeon: [name], MD;  Location: [facility];  Service: Cardiovascular;  Laterality: N/A;   CARDIOVERSION N/A 04/25/2020   Procedure: CARDIOVERSION;  Surgeon: Freada Bergeron, MD;  Location: [facility] ENDOSCOPY;  Service: Cardiovascular;  Laterality: N/A;   CHOLECYSTECTOMY     COLONOSCOPY WITH PROPOFOL N/A 01/09/2020   Procedure: COLONOSCOPY WITH PROPOFOL;  Surgeon: Doran Stabler, MD;  Location: [facility] ENDOSCOPY;  Service: Gastroenterology;  Laterality: N/A;   ESOPHAGOGASTRODUODENOSCOPY (EGD) WITH PROPOFOL N/A 01/09/2020   Procedure: ESOPHAGOGASTRODUODENOSCOPY (EGD) WITH PROPOFOL;  Surgeon: Doran Stabler, MD;  Location: [facility] ENDOSCOPY;  Service: Gastroenterology;  Laterality: N/A;   HEMOSTASIS CLIP PLACEMENT  01/09/2020   Procedure: HEMOSTASIS CLIP PLACEMENT;  Surgeon: Doran Stabler, MD;  Location: [facility] ENDOSCOPY;  Service: Gastroenterology;;   OPEN REDUCTION INTERNAL FIXATION (ORIF) TIBIA/FIBULA FRACTURE Right 11/03/2013   Procedure: OPEN REDUCTION INTERNAL FIXATION (ORIF) RIGHT TIBIA/FIBULA PILON FRACTURE;  Surgeon: Wylene Simmer, MD;  Location: [facility];  Service: Orthopedics;  Laterality: Right;   OVARIAN CYST SURGERY Left    age 23   POLYPECTOMY  01/09/2020   Procedure: POLYPECTOMY;  Surgeon: Doran Stabler, MD;  Location: [facility] ENDOSCOPY;  Service: Gastroenterology;;   THYROIDECTOMY Bilateral 1976   TUBAL LIGATION       No Known Allergies    Family History  Problem Relation Age of Onset   Stroke Mother        brain hemmorhage   Diabetes Mother    Heart disease Mother    Thyroid disease Mother    Arrhythmia Mother    Stroke Father    Heart attack Father        blood clot in brain   Stroke Sister    Cancer Sister    Stomach cancer Brother 62   Hypertension Brother    Esophageal cancer Brother 45   Stroke Brother    Hypertension Brother    Arrhythmia Sister    Hypertension Sister    Colon polyps Neg Hx    Colon  cancer Neg Hx    Rectal cancer Neg Hx      Social History [name] reports that she quit smoking about 10 years ago. Her smoking use included cigarettes. She has a 90.00 pack-year smoking history. She has never used smokeless tobacco. Miranda Wilkinson reports no history of alcohol use.   Review of Systems CONSTITUTIONAL: No weight loss, fever, chills, weakness or fatigue.  HEENT:  Eyes: No visual loss, blurred vision, double vision or yellow sclerae.No hearing loss, sneezing, congestion, runny nose or sore throat.  SKIN: No rash or itching.  CARDIOVASCULAR: per hpi RESPIRATORY: No shortness of breath, cough or sputum.  GASTROINTESTINAL: No anorexia, nausea, vomiting or diarrhea. No abdominal pain or blood.  GENITOURINARY: No burning on urination, no polyuria NEUROLOGICAL: No headache, dizziness, syncope, paralysis, ataxia, numbness or tingling in the extremities. No change in bowel or bladder control.  MUSCULOSKELETAL: No muscle, back pain, joint pain or stiffness.  LYMPHATICS: No enlarged nodes. No history of splenectomy.  PSYCHIATRIC: No history of depression or anxiety.  ENDOCRINOLOGIC: No reports of sweating, cold or heat intolerance. No polyuria or polydipsia.  Marland Kitchen   Physical Examination Today's Vitals   12/24/21 1348  BP: 130/80  Pulse: 86  SpO2: 96%  Weight: 227 lb (103 kg)  Height: '5\' 5"'$  (1.651 m)   Body mass index is 37.77 kg/m.  Gen: resting comfortably, no acute distress HEENT: no scleral icterus, pupils equal round and reactive, no palptable cervical adenopathy,  CV: irreg, no mrg, no jvd Resp: Clear to auscultation bilaterally GI: abdomen is soft, non-tender, non-distended, normal bowel sounds, no hepatosplenomegaly MSK: extremities are warm, no edema.  Skin: warm, no rash Neuro:  no focal deficits Psych: appropriate affect   Diagnostic Studies 02/2019 echo IMPRESSIONS     1. Left ventricular ejection fraction, by visual estimation, is 70 to  75%. The left  ventricle has hyperdynamic function. There is no left  ventricular hypertrophy.   2. Left ventricular diastolic parameters are indeterminate.   3. The left ventricle has no regional wall motion abnormalities.   4. Global right ventricle has normal systolic function.The right  ventricular size is normal. No increase in right ventricular wall  thickness.   5. Left atrial size was normal.   6. Right atrial size was normal.   7. The mitral valve is normal in structure. Trivial mitral valve  regurgitation. No evidence of mitral stenosis.   8. The tricuspid valve is normal in structure. Tricuspid valve  regurgitation is not demonstrated.   9. The aortic valve has an indeterminant number of cusps. Aortic valve  regurgitation is not visualized. Mild aortic valve sclerosis without  stenosis.  10. The pulmonic valve was not well visualized. Pulmonic valve  regurgitation is not visualized.  11. Normal pulmonary artery systolic pressure.     Assessment and Plan  1. Permanent afib - off tikosyn after recent admission with AKI. Failed amio. Deemed poor ablation candidate -occasional palpitations, increase bisoprolol to '20mg'$  daily. Could add dilt if needed    2. HTN - at goal, continue current meds     3. Hyperlipidemia --request labs from pcp, continue current meds   F/u 6 months        Arnoldo Lenis, M.D.

## 2021-12-24 NOTE — Patient Instructions (Signed)
Medication Instructions:  Increase Bisoprolol to '20mg'$  daily Continue all other medications.     Labwork: none  Testing/Procedures: none  Follow-Up: 6 months   Any Other Special Instructions Will Be Listed Below (If Applicable).   If you need a refill on your cardiac medications before your next appointment, please call your pharmacy.

## 2021-12-25 DIAGNOSIS — H353124 Nonexudative age-related macular degeneration, left eye, advanced atrophic with subfoveal involvement: Secondary | ICD-10-CM | POA: Diagnosis not present

## 2021-12-25 DIAGNOSIS — H353113 Nonexudative age-related macular degeneration, right eye, advanced atrophic without subfoveal involvement: Secondary | ICD-10-CM | POA: Diagnosis not present

## 2021-12-25 DIAGNOSIS — H2513 Age-related nuclear cataract, bilateral: Secondary | ICD-10-CM | POA: Diagnosis not present

## 2021-12-25 DIAGNOSIS — H43811 Vitreous degeneration, right eye: Secondary | ICD-10-CM | POA: Diagnosis not present

## 2021-12-30 DIAGNOSIS — Z23 Encounter for immunization: Secondary | ICD-10-CM | POA: Diagnosis not present

## 2022-01-18 ENCOUNTER — Encounter: Payer: Self-pay | Admitting: Cardiology

## 2022-01-20 ENCOUNTER — Other Ambulatory Visit: Payer: Self-pay | Admitting: *Deleted

## 2022-01-20 MED ORDER — HYDROCHLOROTHIAZIDE 25 MG PO TABS: 25.0000 mg | ORAL_TABLET | Freq: Every day | ORAL | 3 refills | Status: DC

## 2022-01-26 ENCOUNTER — Inpatient Hospital Stay (HOSPITAL_COMMUNITY)
Admission: EM | Admit: 2022-01-26 | Discharge: 2022-02-05 | DRG: 522 | Disposition: A | Discharge status: Skilled Nursing Facility | Payer: Medicare Other | Attending: Internal Medicine | Admitting: Internal Medicine

## 2022-01-26 ENCOUNTER — Emergency Department (HOSPITAL_COMMUNITY): Payer: Medicare Other

## 2022-01-26 ENCOUNTER — Encounter (HOSPITAL_COMMUNITY): Payer: Self-pay

## 2022-01-26 ENCOUNTER — Other Ambulatory Visit: Payer: Self-pay

## 2022-01-26 DIAGNOSIS — I482 Chronic atrial fibrillation, unspecified: Secondary | ICD-10-CM | POA: Diagnosis present

## 2022-01-26 DIAGNOSIS — F419 Anxiety disorder, unspecified: Secondary | ICD-10-CM | POA: Diagnosis not present

## 2022-01-26 DIAGNOSIS — E039 Hypothyroidism, unspecified: Secondary | ICD-10-CM | POA: Diagnosis not present

## 2022-01-26 DIAGNOSIS — F32A Depression, unspecified: Secondary | ICD-10-CM | POA: Diagnosis present

## 2022-01-26 DIAGNOSIS — S72042A Displaced fracture of base of neck of left femur, initial encounter for closed fracture: Secondary | ICD-10-CM | POA: Diagnosis not present

## 2022-01-26 DIAGNOSIS — Z713 Dietary counseling and surveillance: Secondary | ICD-10-CM

## 2022-01-26 DIAGNOSIS — E782 Mixed hyperlipidemia: Secondary | ICD-10-CM | POA: Diagnosis present

## 2022-01-26 DIAGNOSIS — G4733 Obstructive sleep apnea (adult) (pediatric): Secondary | ICD-10-CM | POA: Diagnosis present

## 2022-01-26 DIAGNOSIS — Z79890 Hormone replacement therapy (postmenopausal): Secondary | ICD-10-CM

## 2022-01-26 DIAGNOSIS — N1831 Chronic kidney disease, stage 3a: Secondary | ICD-10-CM | POA: Diagnosis present

## 2022-01-26 DIAGNOSIS — E890 Postprocedural hypothyroidism: Secondary | ICD-10-CM | POA: Diagnosis present

## 2022-01-26 DIAGNOSIS — S72001A Fracture of unspecified part of neck of right femur, initial encounter for closed fracture: Secondary | ICD-10-CM | POA: Diagnosis not present

## 2022-01-26 DIAGNOSIS — H9190 Unspecified hearing loss, unspecified ear: Secondary | ICD-10-CM | POA: Diagnosis present

## 2022-01-26 DIAGNOSIS — J449 Chronic obstructive pulmonary disease, unspecified: Secondary | ICD-10-CM | POA: Diagnosis not present

## 2022-01-26 DIAGNOSIS — E1165 Type 2 diabetes mellitus with hyperglycemia: Secondary | ICD-10-CM | POA: Diagnosis present

## 2022-01-26 DIAGNOSIS — Z823 Family history of stroke: Secondary | ICD-10-CM

## 2022-01-26 DIAGNOSIS — R52 Pain, unspecified: Secondary | ICD-10-CM | POA: Diagnosis not present

## 2022-01-26 DIAGNOSIS — T68XXXA Hypothermia, initial encounter: Secondary | ICD-10-CM | POA: Diagnosis not present

## 2022-01-26 DIAGNOSIS — I4891 Unspecified atrial fibrillation: Secondary | ICD-10-CM | POA: Diagnosis not present

## 2022-01-26 DIAGNOSIS — S0990XA Unspecified injury of head, initial encounter: Secondary | ICD-10-CM | POA: Diagnosis not present

## 2022-01-26 DIAGNOSIS — W1830XA Fall on same level, unspecified, initial encounter: Secondary | ICD-10-CM | POA: Diagnosis present

## 2022-01-26 DIAGNOSIS — Z8249 Family history of ischemic heart disease and other diseases of the circulatory system: Secondary | ICD-10-CM

## 2022-01-26 DIAGNOSIS — Z7984 Long term (current) use of oral hypoglycemic drugs: Secondary | ICD-10-CM

## 2022-01-26 DIAGNOSIS — Z96641 Presence of right artificial hip joint: Secondary | ICD-10-CM | POA: Diagnosis not present

## 2022-01-26 DIAGNOSIS — I10 Essential (primary) hypertension: Secondary | ICD-10-CM | POA: Diagnosis present

## 2022-01-26 DIAGNOSIS — N390 Urinary tract infection, site not specified: Secondary | ICD-10-CM | POA: Diagnosis not present

## 2022-01-26 DIAGNOSIS — K219 Gastro-esophageal reflux disease without esophagitis: Secondary | ICD-10-CM | POA: Diagnosis not present

## 2022-01-26 DIAGNOSIS — E1122 Type 2 diabetes mellitus with diabetic chronic kidney disease: Secondary | ICD-10-CM | POA: Diagnosis not present

## 2022-01-26 DIAGNOSIS — Z800 Family history of malignant neoplasm of digestive organs: Secondary | ICD-10-CM

## 2022-01-26 DIAGNOSIS — Z7901 Long term (current) use of anticoagulants: Secondary | ICD-10-CM | POA: Diagnosis not present

## 2022-01-26 DIAGNOSIS — W19XXXD Unspecified fall, subsequent encounter: Secondary | ICD-10-CM | POA: Diagnosis not present

## 2022-01-26 DIAGNOSIS — Z9989 Dependence on other enabling machines and devices: Secondary | ICD-10-CM | POA: Diagnosis not present

## 2022-01-26 DIAGNOSIS — E8809 Other disorders of plasma-protein metabolism, not elsewhere classified: Secondary | ICD-10-CM | POA: Diagnosis present

## 2022-01-26 DIAGNOSIS — Z7401 Bed confinement status: Secondary | ICD-10-CM | POA: Diagnosis not present

## 2022-01-26 DIAGNOSIS — Z9049 Acquired absence of other specified parts of digestive tract: Secondary | ICD-10-CM

## 2022-01-26 DIAGNOSIS — Z8349 Family history of other endocrine, nutritional and metabolic diseases: Secondary | ICD-10-CM

## 2022-01-26 DIAGNOSIS — Z9981 Dependence on supplemental oxygen: Secondary | ICD-10-CM

## 2022-01-26 DIAGNOSIS — Z79899 Other long term (current) drug therapy: Secondary | ICD-10-CM

## 2022-01-26 DIAGNOSIS — Z6838 Body mass index (BMI) 38.0-38.9, adult: Secondary | ICD-10-CM | POA: Diagnosis not present

## 2022-01-26 DIAGNOSIS — I129 Hypertensive chronic kidney disease with stage 1 through stage 4 chronic kidney disease, or unspecified chronic kidney disease: Secondary | ICD-10-CM | POA: Diagnosis present

## 2022-01-26 DIAGNOSIS — E871 Hypo-osmolality and hyponatremia: Secondary | ICD-10-CM | POA: Diagnosis not present

## 2022-01-26 DIAGNOSIS — D509 Iron deficiency anemia, unspecified: Secondary | ICD-10-CM | POA: Diagnosis not present

## 2022-01-26 DIAGNOSIS — I6782 Cerebral ischemia: Secondary | ICD-10-CM | POA: Diagnosis not present

## 2022-01-26 DIAGNOSIS — Z471 Aftercare following joint replacement surgery: Secondary | ICD-10-CM | POA: Diagnosis not present

## 2022-01-26 DIAGNOSIS — R531 Weakness: Secondary | ICD-10-CM | POA: Diagnosis not present

## 2022-01-26 DIAGNOSIS — E669 Obesity, unspecified: Secondary | ICD-10-CM | POA: Diagnosis present

## 2022-01-26 DIAGNOSIS — Z87891 Personal history of nicotine dependence: Secondary | ICD-10-CM | POA: Diagnosis not present

## 2022-01-26 DIAGNOSIS — Y92009 Unspecified place in unspecified non-institutional (private) residence as the place of occurrence of the external cause: Secondary | ICD-10-CM | POA: Diagnosis not present

## 2022-01-26 DIAGNOSIS — N3000 Acute cystitis without hematuria: Secondary | ICD-10-CM | POA: Diagnosis not present

## 2022-01-26 DIAGNOSIS — L89152 Pressure ulcer of sacral region, stage 2: Secondary | ICD-10-CM | POA: Diagnosis not present

## 2022-01-26 DIAGNOSIS — S72001D Fracture of unspecified part of neck of right femur, subsequent encounter for closed fracture with routine healing: Secondary | ICD-10-CM | POA: Diagnosis not present

## 2022-01-26 DIAGNOSIS — D72829 Elevated white blood cell count, unspecified: Secondary | ICD-10-CM | POA: Diagnosis not present

## 2022-01-26 DIAGNOSIS — Z833 Family history of diabetes mellitus: Secondary | ICD-10-CM

## 2022-01-26 DIAGNOSIS — R509 Fever, unspecified: Secondary | ICD-10-CM | POA: Diagnosis not present

## 2022-01-26 DIAGNOSIS — R0689 Other abnormalities of breathing: Secondary | ICD-10-CM | POA: Diagnosis not present

## 2022-01-26 DIAGNOSIS — N179 Acute kidney failure, unspecified: Secondary | ICD-10-CM | POA: Diagnosis not present

## 2022-01-26 DIAGNOSIS — Z793 Long term (current) use of hormonal contraceptives: Secondary | ICD-10-CM

## 2022-01-26 LAB — BASIC METABOLIC PANEL
Anion gap: 12 (ref 5–15)
BUN: 15 mg/dL (ref 8–23)
CO2: 25 mmol/L (ref 22–32)
Calcium: 9.8 mg/dL (ref 8.9–10.3)
Chloride: 95 mmol/L — ABNORMAL LOW (ref 98–111)
Creatinine, Ser: 1.18 mg/dL — ABNORMAL HIGH (ref 0.44–1.00)
GFR, Estimated: 46 mL/min — ABNORMAL LOW (ref >60)
Glucose, Bld: 209 mg/dL — ABNORMAL HIGH (ref 70–99)
Potassium: 3.7 mmol/L (ref 3.5–5.1)
Sodium: 132 mmol/L — ABNORMAL LOW (ref 135–145)

## 2022-01-26 LAB — CBC
HCT: 45.3 % (ref 36.0–46.0)
Hemoglobin: 15.4 g/dL — ABNORMAL HIGH (ref 12.0–15.0)
MCH: 32.2 pg (ref 26.0–34.0)
MCHC: 34 g/dL (ref 30.0–36.0)
MCV: 94.8 fL (ref 80.0–100.0)
Platelets: 245 10*3/uL (ref 150–400)
RBC: 4.78 MIL/uL (ref 3.87–5.11)
RDW: 13.3 % (ref 11.5–15.5)
WBC: 13.8 10*3/uL — ABNORMAL HIGH (ref 4.0–10.5)
nRBC: 0 % (ref 0.0–0.2)

## 2022-01-26 MED ORDER — MELATONIN 3 MG PO TABS
3.0000 mg | ORAL_TABLET | Freq: Every evening | ORAL | Status: DC | PRN
  Administered 2022-01-27: 6 mg via ORAL
  Filled 2022-01-26: qty 2

## 2022-01-26 MED ORDER — ONDANSETRON HCL 4 MG/2ML IJ SOLN
4.0000 mg | Freq: Once | INTRAMUSCULAR | Status: AC
  Administered 2022-01-26: 4 mg via INTRAVENOUS
  Filled 2022-01-26: qty 2

## 2022-01-26 MED ORDER — MORPHINE SULFATE (PF) 4 MG/ML IV SOLN
4.0000 mg | Freq: Once | INTRAVENOUS | Status: AC
  Administered 2022-01-26: 4 mg via INTRAVENOUS
  Filled 2022-01-26: qty 1

## 2022-01-26 MED ORDER — BISOPROLOL FUMARATE 5 MG PO TABS
20.0000 mg | ORAL_TABLET | Freq: Every day | ORAL | Status: DC
  Administered 2022-01-27 – 2022-02-05 (×10): 20 mg via ORAL
  Filled 2022-01-26 (×10): qty 4

## 2022-01-26 MED ORDER — INSULIN ASPART 100 UNIT/ML IJ SOLN
0.0000 [IU] | Freq: Three times a day (TID) | INTRAMUSCULAR | Status: DC
  Administered 2022-01-27: 2 [IU] via SUBCUTANEOUS
  Administered 2022-01-27 (×2): 3 [IU] via SUBCUTANEOUS
  Administered 2022-01-28 (×3): 2 [IU] via SUBCUTANEOUS
  Administered 2022-01-29: 3 [IU] via SUBCUTANEOUS
  Administered 2022-01-29: 2 [IU] via SUBCUTANEOUS
  Administered 2022-01-29: 3 [IU] via SUBCUTANEOUS
  Administered 2022-01-30: 9 [IU] via SUBCUTANEOUS
  Administered 2022-01-30 – 2022-01-31 (×2): 3 [IU] via SUBCUTANEOUS
  Administered 2022-01-31: 5 [IU] via SUBCUTANEOUS
  Administered 2022-02-01: 2 [IU] via SUBCUTANEOUS
  Administered 2022-02-01: 5 [IU] via SUBCUTANEOUS
  Administered 2022-02-01: 3 [IU] via SUBCUTANEOUS
  Administered 2022-02-02: 1 [IU] via SUBCUTANEOUS
  Administered 2022-02-02: 2 [IU] via SUBCUTANEOUS
  Administered 2022-02-02: 1 [IU] via SUBCUTANEOUS
  Administered 2022-02-03: 5 [IU] via SUBCUTANEOUS
  Administered 2022-02-03: 2 [IU] via SUBCUTANEOUS
  Administered 2022-02-04 (×2): 1 [IU] via SUBCUTANEOUS
  Administered 2022-02-04 – 2022-02-05 (×2): 2 [IU] via SUBCUTANEOUS
  Administered 2022-02-05: 5 [IU] via SUBCUTANEOUS

## 2022-01-26 MED ORDER — SODIUM CHLORIDE 0.9 % IV SOLN: INTRAVENOUS | Status: AC

## 2022-01-26 MED ORDER — LEVOTHYROXINE SODIUM 112 MCG PO TABS
112.0000 ug | ORAL_TABLET | Freq: Every day | ORAL | Status: DC
  Administered 2022-01-28 – 2022-02-05 (×9): 112 ug via ORAL
  Filled 2022-01-26 (×9): qty 1

## 2022-01-26 MED ORDER — MORPHINE SULFATE (PF) 2 MG/ML IV SOLN
2.0000 mg | INTRAVENOUS | Status: DC | PRN
  Administered 2022-01-27 (×2): 2 mg via INTRAVENOUS
  Filled 2022-01-26 (×2): qty 1

## 2022-01-26 MED ORDER — LISINOPRIL 10 MG PO TABS
10.0000 mg | ORAL_TABLET | Freq: Every day | ORAL | Status: DC
  Administered 2022-01-27 – 2022-01-29 (×3): 10 mg via ORAL
  Filled 2022-01-26 (×3): qty 1

## 2022-01-26 MED ORDER — PANTOPRAZOLE SODIUM 40 MG PO TBEC
40.0000 mg | DELAYED_RELEASE_TABLET | Freq: Every day | ORAL | Status: DC
  Administered 2022-01-27 – 2022-02-05 (×10): 40 mg via ORAL
  Filled 2022-01-26 (×10): qty 1

## 2022-01-26 MED ORDER — SIMVASTATIN 20 MG PO TABS
20.0000 mg | ORAL_TABLET | Freq: Every day | ORAL | Status: DC
  Administered 2022-01-27 – 2022-02-04 (×9): 20 mg via ORAL
  Filled 2022-01-26 (×9): qty 1

## 2022-01-26 NOTE — ED Notes (Signed)
Patient transported to X-ray 

## 2022-01-26 NOTE — H&P (Incomplete)
History and Physical    Patient: Miranda Wilkinson [id number]:[id number] DOB: September 20, 1935 DOA: 01/26/2022 DOS: the patient was seen and examined on 01/26/2022 PCP: Caryl Bis, MD  Patient coming from: Home  Chief Complaint:  Chief Complaint  Patient presents with  . Fall   HPI: Miranda Wilkinson is a 86 y.o. female with medical history significant of hypertension, hyperlipidemia, T2DM, atrial fibrillation on Eliquis, hypothyroidism, GERD, CKD 3A who presents to the emergency department due to fall at home.  Patient states that she slipped on a rug and fell landing on her right side and hitting her head but denies LOC, she started to complain of right hip pain after the fall and had difficulty in being able to bear weight on the right lower extremity.  EMS was activated and patient was sent to the ED for further evaluation and management.  ED Course:  In the emergency department, she was hemodynamically stable, BP was 140/87 and other vital signs are within normal range.  Work-up in the ED showed WBC 13.8, hemoglobin 15.4, hematocrit 44.3, MCV 94.8, platelets 245.  BMP showed sodium 132, potassium 37, chloride 95, bicarb 25, glucose 209, BUN 15, creatinine 1.18 (creatinine is within baseline range). CT without contrast showed no evidence of acute intracranial abnormality Right hip x-ray showed acute  transverse mid cervical to basicervical right femoral neck fracture with impaction and mild cephalad displacement and rotation of the distal fragment. Patient was treated with IV morphine and Zofran.  Orthopedic surgeon on-call (Dr. Alvan Dame) was consulted and recommended that patient can be admitted here at [place] and to consult with local orthopedic surgeon here in the morning.  Hospitalist was asked to admit for further evaluation and management.  Review of Systems: Review of systems as noted in the HPI. All other systems reviewed and are negative.   Past Medical History:  Diagnosis Date  . Allergy     seasonal allergies  . Anemia    IDA- on iron supplement  . Anxiety   . Atrial fibrillation ([place])   . Blood transfusion without reported diagnosis    2021- due to IDA/ low hemo (7.3)  . Cancer ([place]) 1976   bilaterl thyroid ca  . Diabetes mellitus without complication ([place])    on meds  . GERD (gastroesophageal reflux disease)    on meds---OTC  . Headache(784.0)    hx migraines  . Hyperlipidemia    on meds  . Hypertension    on meds  . Hypothyroidism    thyroidectomy in 1976- on meds at  this time  . On supplemental oxygen therapy    2 L at night  . Shortness of breath   . Sinus congestion   . Sleep apnea    study done in [place], [place], use CPAP----with home O2 concentrater at night with CPAP   Past Surgical History:  Procedure Laterality Date  . APPENDECTOMY    . CARDIOVERSION N/A 10/27/2019   Procedure: CARDIOVERSION;  Surgeon: [name], MD;  Location: [facility] ENDOSCOPY;  Service: Cardiovascular;  Laterality: N/A;  . CARDIOVERSION N/A 04/25/2020   Procedure: CARDIOVERSION;  Surgeon: Freada Bergeron, MD;  Location: [facility] ENDOSCOPY;  Service: Cardiovascular;  Laterality: N/A;  . CHOLECYSTECTOMY    . COLONOSCOPY WITH PROPOFOL N/A 01/09/2020   Procedure: COLONOSCOPY WITH PROPOFOL;  Surgeon: Doran Stabler, MD;  Location: [facility] ENDOSCOPY;  Service: Gastroenterology;  Laterality: N/A;  . ESOPHAGOGASTRODUODENOSCOPY (EGD) WITH PROPOFOL N/A 01/09/2020   Procedure: ESOPHAGOGASTRODUODENOSCOPY (EGD) WITH PROPOFOL;  Surgeon: Doran Stabler, MD;  Location: [facility] ENDOSCOPY;  Service: Gastroenterology;  Laterality: N/A;  . HEMOSTASIS CLIP PLACEMENT  01/09/2020   Procedure: HEMOSTASIS CLIP PLACEMENT;  Surgeon: Doran Stabler, MD;  Location: [facility] ENDOSCOPY;  Service: Gastroenterology;;  . OPEN REDUCTION INTERNAL FIXATION (ORIF) TIBIA/FIBULA FRACTURE Right 11/03/2013   Procedure: OPEN REDUCTION INTERNAL FIXATION (ORIF) RIGHT TIBIA/FIBULA PILON FRACTURE;  Surgeon: Wylene Simmer, MD;   Location: [place];  Service: Orthopedics;  Laterality: Right;  . OVARIAN CYST SURGERY Left    age 67  . POLYPECTOMY  01/09/2020   Procedure: POLYPECTOMY;  Surgeon: Doran Stabler, MD;  Location: [facility] ENDOSCOPY;  Service: Gastroenterology;;  . THYROIDECTOMY Bilateral 1976  . TUBAL LIGATION      Social History:  reports that she quit smoking about 10 years ago. Her smoking use included cigarettes. She has a 90.00 pack-year smoking history. She has never been exposed to tobacco smoke. She has never used smokeless tobacco. She reports that she does not drink alcohol and does not use drugs.   No Known Allergies  Family History  Problem Relation Age of Onset  . Stroke Mother        brain hemmorhage  . Diabetes Mother   . Heart disease Mother   . Thyroid disease Mother   . Arrhythmia Mother   . Stroke Father   . Heart attack Father        blood clot in brain  . Stroke Sister   . Cancer Sister   . Stomach cancer Brother 93  . Hypertension Brother   . Esophageal cancer Brother 89  . Stroke Brother   . Hypertension Brother   . Arrhythmia Sister   . Hypertension Sister   . Colon polyps Neg Hx   . Colon cancer Neg Hx   . Rectal cancer Neg Hx     ***  Prior to Admission medications   Medication Sig Start Date End Date Taking? Authorizing Provider  acetaminophen (TYLENOL) 500 MG tablet Take 500 mg by mouth every 6 (six) hours as needed (for pain.).    [provider]  bisoprolol (ZEBETA) 10 MG tablet Take 2 tablets (20 mg total) by mouth daily. 12/24/21   Arnoldo Lenis, MD  Cholecalciferol (VITAMIN D3) 50 MCG (2000 UT) capsule Take 2,000 Units by mouth daily.    [provider]  ELIQUIS 5 MG TABS tablet Take 1 tablet (5 mg total) by mouth 2 (two) times daily. 09/24/21   Arnoldo Lenis, MD  GUAIFENESIN 1200 PO Take 1,200 mg by mouth 2 (two) times daily as needed (sinus congestion.).    [provider]  hydrochlorothiazide (HYDRODIURIL) 25 MG tablet  Take 1 tablet (25 mg total) by mouth daily. 01/20/22   Arnoldo Lenis, MD  levothyroxine (SYNTHROID) 112 MCG tablet Take 112 mcg by mouth daily before breakfast.     [provider]  lisinopril (ZESTRIL) 10 MG tablet Take 10 mg by mouth daily. 08/02/20   [provider]  loratadine (CLARITIN) 10 MG tablet Take 1 tablet by mouth daily.    [provider]  melatonin 5 MG TABS Take 2.5-5 mg by mouth at bedtime as needed (sleep).     [provider]  metFORMIN (GLUCOPHAGE-XR) 500 MG 24 hr tablet Take 1,000 mg by mouth daily with supper.    [provider]  Multiple Vitamins-Minerals (MULTIVITAMIN WITH MINERALS) tablet Take 1 tablet by mouth daily.    [provider]  omeprazole (  PRILOSEC) 20 MG capsule Take 20 mg by mouth daily as needed (acid reflux/indigestion.).    [provider]  Pegcetacoplan, Ophthalmic, 15 MG/0.1ML SOLN Injection in right eye every two months    [provider]  simvastatin (ZOCOR) 40 MG tablet Take 20 mg by mouth daily with supper.     [provider]  triamcinolone cream (KENALOG) 0.1 % Apply 1 application topically 2 (two) times daily as needed (psoriasis). 12/19/19   [provider]    Physical Exam: BP (!) 146/87   Pulse 98   Temp 97.6 F (36.4 C)   Resp 16   Ht '5\' 5"'$  (1.651 m)   Wt 106 kg   SpO2 97%   BMI 38.89 kg/m   General: 86 y.o. year-old female well developed well nourished in no acute distress.  Alert and oriented x3. HEENT: NCAT, EOMI Neck: Supple, trachea medial Cardiovascular: Regular rate and rhythm with no rubs or gallops.  No thyromegaly or JVD noted.  No lower extremity edema. 2/4 pulses in all 4 extremities. Respiratory: Clear to auscultation with no wheezes or rales. Good inspiratory effort. Abdomen: Soft, nontender nondistended with normal bowel sounds x4 quadrants. Muskuloskeletal: Tenderness to palpation of right hip, ROM of RLE due to right hip  pain.  Right elbow hematoma without any restrictive ROM.   Neuro: CN II-XII intact,  sensation, reflexes intact Skin: No ulcerative lesions noted or rashes Psychiatry: Mood is appropriate for condition and setting          Labs on Admission:  Basic Metabolic Panel: Recent Labs  Lab 01/26/22 2135  NA 132*  K 3.7  CL 95*  CO2 25  GLUCOSE 209*  BUN 15  CREATININE 1.18*  CALCIUM 9.8   Liver Function Tests: No results for input(s): "AST", "ALT", "ALKPHOS", "BILITOT", "PROT", "ALBUMIN" in the last 168 hours. No results for input(s): "LIPASE", "AMYLASE" in the last 168 hours. No results for input(s): "AMMONIA" in the last 168 hours. CBC: Recent Labs  Lab 01/26/22 2135  WBC 13.8*  HGB 15.4*  HCT 45.3  MCV 94.8  PLT 245   Cardiac Enzymes: No results for input(s): "CKTOTAL", "CKMB", "CKMBINDEX", "TROPONINI" in the last 168 hours.  BNP (last 3 results) No results for input(s): "BNP" in the last 8760 hours.  ProBNP (last 3 results) No results for input(s): "PROBNP" in the last 8760 hours.  CBG: No results for input(s): "GLUCAP" in the last 168 hours.  Radiological Exams on Admission: CT Head Wo Contrast  Result Date: 01/26/2022 CLINICAL DATA:  Fall, on Eliquis EXAM: CT HEAD WITHOUT CONTRAST TECHNIQUE: Contiguous axial images were obtained from the base of the skull through the vertex without intravenous contrast. RADIATION DOSE REDUCTION: This exam was performed according to the departmental dose-optimization program which includes automated exposure control, adjustment of the mA and/or kV according to patient size and/or use of iterative reconstruction technique. COMPARISON:  None Available. FINDINGS: Brain: No evidence of acute infarction, hemorrhage, hydrocephalus, extra-axial collection or mass lesion/mass effect. Global cortical atrophy. Subcortical white matter and periventricular small vessel ischemic changes. Vascular: No hyperdense vessel or unexpected calcification.  Skull: Normal. Negative for fracture or focal lesion. Sinuses/Orbits: The visualized paranasal sinuses are essentially clear. The mastoid air cells are unopacified. Other: None. IMPRESSION: No evidence of acute intracranial abnormality. Atrophy with small vessel ischemic changes. Electronically Signed   By: Julian Hy M.D.   On: 01/26/2022 22:23   DG Hip Unilat W or Wo Pelvis 2-3 Views Right  Result  Date: 01/26/2022 CLINICAL DATA:  Right hip injury.  Fracture suspected. EXAM: DG HIP (WITH OR WITHOUT PELVIS) 2-3V RIGHT COMPARISON:  AP pelvis 11/06/2013 FINDINGS: The bone mineralization is osteopenic. There is an acute transverse mid cervical to basicervical right femoral neck fracture. The distal fragment demonstrates impaction and mild cephalad displacement with mild rotation. No pelvic fracture or diastasis is seen. The proximal left femur is unremarkable. There is mild nonerosive degenerative arthrosis of the SI joints, pubic symphysis and hips, facet hypertrophy at the lowest 2 lumbar levels. There are additional findings of enthesopathy of the lateral pelvic wings and greater trochanters. No obvious soft tissue abnormality. IMPRESSION: 1. Acute transverse mid cervical to basicervical right femoral neck fracture with impaction and mild cephalad displacement and rotation of the distal fragment. 2. Osteopenia and degenerative changes. Electronically Signed   By: Telford Nab M.D.   On: 01/26/2022 22:04    EKG: I independently viewed the EKG done and my findings are as followed: EKG was not done in the ED  Assessment/Plan Present on Admission: . Closed displaced fracture of right femoral neck ([place])  Principal Problem:   Closed displaced fracture of right femoral neck (HCC)  Closed displaced fracture of right femoral neck Right hip x-ray suggestive of right femoral neck fracture Continue Tylenol as needed for mild pain Continue IV morphine 2 mg every 4 hours for moderate/severe pain as  needed Continue fall precaution Patient is on Eliquis and last dose was  **** Orthopedic surgeon consulted recommended admitting patient here at [place] and to put in an official consult for local orthopedic surgeon in the morning. Consider PT/OT eval and treat status post surgical repair  Leukocytosis possibly reactive WBC 13.8, continue to monitor WBC with morning labs  Type 2 diabetes mellitus with uncontrolled hyperglycemia Continue ISS and hypoglycemia protocol  Hyponatremia Na 132, possibly secondary to diuretic effect Continue IV hydration  Chronic atrial fibrillation Continue bisoprolol Eliquis will be temporarily held at this time due to anticipated surgical repair of right femoral neck fracture  Essential hypertension Continue bisoprolol and lisinopril  Mixed hyperlipidemia Continue Zocor  Acquired hypothyroidism Continue Synthroid  GERD Continue Protonix  CKD 3A Creatinine 1.18 (creatinine is within baseline range) Renally adjust medications, avoid nephrotoxic agents/dehydration/hypotension  Obesity (BMI 30. 89) Diet and lifestyle modification  OSA on CPAP Continue CPAP   DVT prophylaxis: SCDs  Code Status: Full code  Consults: Orthopedic surgery  Family Communication: ***  Severity of Illness: The appropriate patient status for this patient is INPATIENT. Inpatient status is judged to be reasonable and necessary in order to provide the required intensity of service to ensure the patient's safety. The patient's presenting symptoms, physical exam findings, and initial radiographic and laboratory data in the context of their chronic comorbidities is felt to place them at high risk for further clinical deterioration. Furthermore, it is not anticipated that the patient will be medically stable for discharge from the hospital within 2 midnights of admission.   * I certify that at the point of admission it is my clinical judgment that the patient will require  inpatient hospital care spanning beyond 2 midnights from the point of admission due to high intensity of service, high risk for further deterioration and high frequency of surveillance required.*  Author: Bernadette Hoit, DO 01/26/2022 10:46 PM  For on call review [email or web address].

## 2022-01-26 NOTE — ED Triage Notes (Signed)
Pt presents with fall from home. Pt fell and now is complaining of right hip pain after slipping on rug. Pt hit head but denies LOC. Pt is on blood thinners.

## 2022-01-26 NOTE — H&P (Signed)
History and Physical    Patient: Miranda Wilkinson:[id number] DOB: January 04, 1936 DOA: 01/26/2022 DOS: the patient was seen and examined on 01/27/2022 PCP: Caryl Bis, MD  Patient coming from: Home  Chief Complaint:  Chief Complaint  Patient presents with   Fall   HPI: Miranda Wilkinson is a 86 y.o. female with medical history significant of hypertension, hyperlipidemia, T2DM, atrial fibrillation on Eliquis, hypothyroidism, GERD, CKD 3A, HOH who presents to the emergency department via EMS due to a fall sustained at home.  Patient states that she slipped on a rug and fell landing on her right side and hitting her head but denies LOC, she started to complain of right hip pain after the fall and had difficulty in being able to bear weight on the right lower extremity.  EMS was activated and patient was taken to the ED for further evaluation and management.  Patient ambulates mostly with a walker at baseline, though she occasionally uses a cane as well.  ED Course:  In the emergency department, she was hemodynamically stable, BP was 140/87 and other vital signs are within normal range.  Work-up in the ED showed WBC 13.8, hemoglobin 15.4, hematocrit 44.3, MCV 94.8, platelets 245.  BMP showed sodium 132, potassium 37, chloride 95, bicarb 25, glucose 209, BUN 15, creatinine 1.18 (creatinine is within baseline range). CT without contrast showed no evidence of acute intracranial abnormality Right hip x-ray showed acute  transverse mid cervical to basicervical right femoral neck fracture with impaction and mild cephalad displacement and rotation of the distal fragment. Patient was treated with IV morphine and Zofran.  Orthopedic surgeon on-call (Dr. Alvan Dame) was consulted and recommended that patient can be admitted here at [facility] and to consult with local orthopedic surgeon here in the morning.  Hospitalist was asked to admit for further evaluation and management.  Review of Systems: Review of systems as  noted in the HPI. All other systems reviewed and are negative.   Past Medical History:  Diagnosis Date   Allergy    seasonal allergies   Anemia    IDA- on iron supplement   Anxiety    Atrial fibrillation (HCC)    Blood transfusion without reported diagnosis    2021- due to IDA/ low hemo (7.3)   Cancer ([place]) 1976   bilaterl thyroid ca   Diabetes mellitus without complication ([place])    on meds   GERD (gastroesophageal reflux disease)    on meds---OTC   Headache(784.0)    hx migraines   Hyperlipidemia    on meds   Hypertension    on meds   Hypothyroidism    thyroidectomy in 1976- on meds at  this time   On supplemental oxygen therapy    2 L at night   Shortness of breath    Sinus congestion    Sleep apnea    study done in [place], [place], use CPAP----with home O2 concentrater at night with CPAP   Past Surgical History:  Procedure Laterality Date   APPENDECTOMY     CARDIOVERSION N/A 10/27/2019   Procedure: CARDIOVERSION;  Surgeon: [name], MD;  Location: [facility];  Service: Cardiovascular;  Laterality: N/A;   CARDIOVERSION N/A 04/25/2020   Procedure: CARDIOVERSION;  Surgeon: Freada Bergeron, MD;  Location: [facility];  Service: Cardiovascular;  Laterality: N/A;   CHOLECYSTECTOMY     COLONOSCOPY WITH PROPOFOL N/A 01/09/2020   Procedure: COLONOSCOPY WITH PROPOFOL;  Surgeon: Doran Stabler, MD;  Location: [facility];  Service: Gastroenterology;  Laterality: N/A;   ESOPHAGOGASTRODUODENOSCOPY (EGD) WITH PROPOFOL N/A 01/09/2020   Procedure: ESOPHAGOGASTRODUODENOSCOPY (EGD) WITH PROPOFOL;  Surgeon: Doran Stabler, MD;  Location: [facility];  Service: Gastroenterology;  Laterality: N/A;   HEMOSTASIS CLIP PLACEMENT  01/09/2020   Procedure: HEMOSTASIS CLIP PLACEMENT;  Surgeon: Doran Stabler, MD;  [facility]: [facility];  Service: Gastroenterology;;   OPEN REDUCTION INTERNAL FIXATION (ORIF) TIBIA/FIBULA FRACTURE Right 11/03/2013   Procedure: OPEN  REDUCTION INTERNAL FIXATION (ORIF) RIGHT TIBIA/FIBULA PILON FRACTURE;  Surgeon: Wylene Simmer, MD;  Location: [facility];  Service: Orthopedics;  Laterality: Right;   OVARIAN CYST SURGERY Left    age 27   POLYPECTOMY  01/09/2020   Procedure: POLYPECTOMY;  Surgeon: Doran Stabler, MD;  Location: [facility];  Service: Gastroenterology;;   THYROIDECTOMY Bilateral 1976   TUBAL LIGATION      Social History:  reports that she quit smoking about 10 years ago. Her smoking use included cigarettes. She has a 90.00 pack-year smoking history. She has never been exposed to tobacco smoke. She has never used smokeless tobacco. She reports that she does not drink alcohol and does not use drugs.   No Known Allergies  Family History  Problem Relation Age of Onset   Stroke Mother        brain hemmorhage   Diabetes Mother    Heart disease Mother    Thyroid disease Mother    Arrhythmia Mother    Stroke Father    Heart attack Father        blood clot in brain   Stroke Sister    Cancer Sister    Stomach cancer Brother 45   Hypertension Brother    Esophageal cancer Brother 74   Stroke Brother    Hypertension Brother    Arrhythmia Sister    Hypertension Sister    Colon polyps Neg Hx    Colon cancer Neg Hx    Rectal cancer Neg Hx      Prior to Admission medications   Medication Sig Start Date End Date Taking? Authorizing Provider  acetaminophen (TYLENOL) 500 MG tablet Take 500 mg by mouth every 6 (six) hours as needed (for pain.).    [provider]  bisoprolol (ZEBETA) 10 MG tablet Take 2 tablets (20 mg total) by mouth daily. 12/24/21   Arnoldo Lenis, MD  Cholecalciferol (VITAMIN D3) 50 MCG (2000 UT) capsule Take 2,000 Units by mouth daily.    [provider]  ELIQUIS 5 MG TABS tablet Take 1 tablet (5 mg total) by mouth 2 (two) times daily. 09/24/21   Arnoldo Lenis, MD  GUAIFENESIN 1200 PO Take 1,200 mg by mouth 2 (two) times daily as needed (sinus congestion.).     [provider]  hydrochlorothiazide (HYDRODIURIL) 25 MG tablet Take 1 tablet (25 mg total) by mouth daily. 01/20/22   Arnoldo Lenis, MD  levothyroxine (SYNTHROID) 112 MCG tablet Take 112 mcg by mouth daily before breakfast.     [provider]  lisinopril (ZESTRIL) 10 MG tablet Take 10 mg by mouth daily. 08/02/20   [provider]  loratadine (CLARITIN) 10 MG tablet Take 1 tablet by mouth daily.    [provider]  melatonin 5 MG TABS Take 2.5-5 mg by mouth at bedtime as needed (sleep).     [provider]  metFORMIN (GLUCOPHAGE-XR) 500 MG 24 hr tablet Take 1,000 mg by mouth daily with supper.    [provider]  Multiple Vitamins-Minerals (MULTIVITAMIN WITH MINERALS) tablet Take 1 tablet by mouth daily.    [provider]  omeprazole (PRILOSEC) 20 MG capsule Take 20 mg by mouth daily as needed (acid reflux/indigestion.).    [provider]  Pegcetacoplan, Ophthalmic, 15 MG/0.1ML SOLN Injection in right eye every two months    [provider]  simvastatin (ZOCOR) 40 MG tablet Take 20 mg by mouth daily with supper.     [provider]  triamcinolone cream (KENALOG) 0.1 % Apply 1 application topically 2 (two) times daily as needed (psoriasis). 12/19/19   [provider]    Physical Exam: BP (!) 157/102 (BP Location: Right Arm)   Pulse 87   Temp 98 F (36.7 C) (Oral)   Resp 18   Ht '5\' 5"'$  (1.651 m)   Wt 106 kg   SpO2 98%   BMI 38.89 kg/m   General: 86 y.o. year-old female well developed well nourished in no acute distress.  Alert and oriented x3. HEENT: NCAT, EOMI Neck: Supple, trachea medial Cardiovascular: Regular rate and rhythm with no rubs or gallops.  No thyromegaly or JVD noted.  No lower extremity edema. 2/4 pulses in all 4 extremities. Respiratory: Clear to auscultation with no wheezes or rales. Good inspiratory effort. Abdomen: Soft, nontender nondistended with normal bowel  sounds x4 quadrants. Muskuloskeletal: Tenderness to palpation of right hip, ROM of RLE due to right hip pain.  Right elbow hematoma without any restrictive ROM.   Neuro: CN II-XII intact,  sensation, reflexes intact Skin: No ulcerative lesions noted or rashes Psychiatry: Mood is appropriate for condition and setting          Labs on Admission:  Basic Metabolic Panel: Recent Labs  Lab 01/26/22 2135  NA 132*  K 3.7  CL 95*  CO2 25  GLUCOSE 209*  BUN 15  CREATININE 1.18*  CALCIUM 9.8   Liver Function Tests: No results for input(s): "AST", "ALT", "ALKPHOS", "BILITOT", "PROT", "ALBUMIN" in the last 168 hours. No results for input(s): "LIPASE", "AMYLASE" in the last 168 hours. No results for input(s): "AMMONIA" in the last 168 hours. CBC: Recent Labs  Lab 01/26/22 2135  WBC 13.8*  HGB 15.4*  HCT 45.3  MCV 94.8  PLT 245   Cardiac Enzymes: No results for input(s): "CKTOTAL", "CKMB", "CKMBINDEX", "TROPONINI" in the last 168 hours.  BNP (last 3 results) No results for input(s): "BNP" in the last 8760 hours.  ProBNP (last 3 results) No results for input(s): "PROBNP" in the last 8760 hours.  CBG: No results for input(s): "GLUCAP" in the last 168 hours.  Radiological Exams on Admission: CT Head Wo Contrast  Result Date: 01/26/2022 CLINICAL DATA:  Fall, on Eliquis EXAM: CT HEAD WITHOUT CONTRAST TECHNIQUE: Contiguous axial images were obtained from the base of the skull through the vertex without intravenous contrast. RADIATION DOSE REDUCTION: This exam was performed according to the departmental dose-optimization program which includes automated exposure control, adjustment of the mA and/or kV according to patient size and/or use of iterative reconstruction technique. COMPARISON:  None Available. FINDINGS: Brain: No evidence of acute infarction, hemorrhage, hydrocephalus, extra-axial collection or mass lesion/mass effect. Global cortical atrophy. Subcortical white matter and  periventricular small vessel ischemic changes. Vascular: No hyperdense vessel or unexpected calcification. Skull: Normal. Negative for fracture or focal lesion. Sinuses/Orbits: The visualized paranasal sinuses are essentially clear. The mastoid air cells are unopacified. Other: None. IMPRESSION: No evidence of acute intracranial abnormality. Atrophy with small vessel ischemic changes. Electronically Signed  By: Julian Hy M.D.   On: 01/26/2022 22:23   DG Hip Unilat W or Wo Pelvis 2-3 Views Right  Result Date: 01/26/2022 CLINICAL DATA:  Right hip injury.  Fracture suspected. EXAM: DG HIP (WITH OR WITHOUT PELVIS) 2-3V RIGHT COMPARISON:  AP pelvis 11/06/2013 FINDINGS: The bone mineralization is osteopenic. There is an acute transverse mid cervical to basicervical right femoral neck fracture. The distal fragment demonstrates impaction and mild cephalad displacement with mild rotation. No pelvic fracture or diastasis is seen. The proximal left femur is unremarkable. There is mild nonerosive degenerative arthrosis of the SI joints, pubic symphysis and hips, facet hypertrophy at the lowest 2 lumbar levels. There are additional findings of enthesopathy of the lateral pelvic wings and greater trochanters. No obvious soft tissue abnormality. IMPRESSION: 1. Acute transverse mid cervical to basicervical right femoral neck fracture with impaction and mild cephalad displacement and rotation of the distal fragment. 2. Osteopenia and degenerative changes. Electronically Signed   By: Telford Nab M.D.   On: 01/26/2022 22:04    EKG: I independently viewed the EKG done and my findings are as followed: EKG was not done in the ED  Assessment/Plan Present on Admission:  Closed displaced fracture of right femoral neck ([place])  Essential hypertension, benign  Mixed hyperlipidemia  Acquired hypothyroidism  Principal Problem:   Closed displaced fracture of right femoral neck (HCC) Active Problems:   Acquired  hypothyroidism   Essential hypertension, benign   Mixed hyperlipidemia   Type 2 diabetes mellitus with hyperglycemia (HCC)   Hyponatremia   Leukocytosis   Atrial fibrillation, chronic (HCC)   GERD (gastroesophageal reflux disease)   OSA on CPAP   Obesity (BMI 30-39.9)   Chronic kidney disease, stage 3a ([place])  Closed displaced fracture of right femoral neck Right hip x-ray suggestive of right femoral neck fracture Continue Tylenol as needed for mild pain Continue IV morphine 2 mg every 4 hours for moderate/severe pain as needed Continue fall precaution Patient is on Eliquis, last dose was this evening  Orthopedic surgeon consulted recommended admitting patient here at [facility] and to put in an official consult for local orthopedic surgeon in the morning. Consider PT/OT eval and treat status post surgical repair  Leukocytosis possibly reactive WBC 13.8, continue to monitor WBC with morning labs  Type 2 diabetes mellitus with uncontrolled hyperglycemia Continue ISS and hypoglycemia protocol  Hyponatremia Na 132, possibly secondary to diuretic effect Continue IV hydration  Chronic atrial fibrillation Continue bisoprolol Eliquis will be temporarily held at this time due to anticipated surgical repair of right femoral neck fracture  Essential hypertension Continue bisoprolol and lisinopril  Mixed hyperlipidemia Continue Zocor  Acquired hypothyroidism Continue Synthroid  GERD Continue Protonix  CKD 3A Creatinine 1.18 (creatinine is within baseline range) Renally adjust medications, avoid nephrotoxic agents/dehydration/hypotension  Obesity (BMI 30. 89) Diet and lifestyle modification  OSA on CPAP Continue CPAP   DVT prophylaxis: SCDs  Code Status: Full code  Consults: Orthopedic surgery  Family Communication: None at bedside  Severity of Illness: The appropriate patient status for this patient is INPATIENT. Inpatient status is judged to be reasonable and necessary  in order to provide the required intensity of service to ensure the patient's safety. The patient's presenting symptoms, physical exam findings, and initial radiographic and laboratory data in the context of their chronic comorbidities is felt to place them at high risk for further clinical deterioration. Furthermore, it is not anticipated that the patient will be medically stable for discharge from the hospital  within 2 midnights of admission.   * I certify that at the point of admission it is my clinical judgment that the patient will require inpatient hospital care spanning beyond 2 midnights from the point of admission due to high intensity of service, high risk for further deterioration and high frequency of surveillance required.*  Author: Bernadette Hoit, DO 01/27/2022 1:31 AM  For on call review [email or web address].

## 2022-01-26 NOTE — ED Notes (Addendum)
Pts hearing aid charger placed in bag with pt label. Pts family asks for her hearing aids to be charged tonight while pt is sleeping, as she cannot hear without them.

## 2022-01-26 NOTE — ED Provider Notes (Signed)
[facility] EMERGENCY DEPARTMENT Provider Note   CSN: [id number] Arrival date & time: 01/26/22  2057     History  Chief Complaint  Patient presents with   Rollene Rotunda is a 86 y.o. female present emerged department complaint of right hip pain after fall at home.  She says she slipped on a rug.  Landed on her right hip and also struck her head.  She is on Eliquis.  She has had significant pain in right hip and could not walk afterwards.  Denies history of hip surgeries in the past.  Reports she has not seen an orthopedic doctor nearly 50 years.  Does not follow with any group  HPI     Home Medications Prior to Admission medications   Medication Sig Start Date End Date Taking? Authorizing Provider  acetaminophen (TYLENOL) 500 MG tablet Take 500 mg by mouth every 6 (six) hours as needed (for pain.).    [provider]  bisoprolol (ZEBETA) 10 MG tablet Take 2 tablets (20 mg total) by mouth daily. 12/24/21   Arnoldo Lenis, MD  Cholecalciferol (VITAMIN D3) 50 MCG (2000 UT) capsule Take 2,000 Units by mouth daily.    [provider]  ELIQUIS 5 MG TABS tablet Take 1 tablet (5 mg total) by mouth 2 (two) times daily. 09/24/21   Arnoldo Lenis, MD  GUAIFENESIN 1200 PO Take 1,200 mg by mouth 2 (two) times daily as needed (sinus congestion.).    [provider]  hydrochlorothiazide (HYDRODIURIL) 25 MG tablet Take 1 tablet (25 mg total) by mouth daily. 01/20/22   Arnoldo Lenis, MD  levothyroxine (SYNTHROID) 112 MCG tablet Take 112 mcg by mouth daily before breakfast.     [provider]  lisinopril (ZESTRIL) 10 MG tablet Take 10 mg by mouth daily. 08/02/20   [provider]  loratadine (CLARITIN) 10 MG tablet Take 1 tablet by mouth daily.    [provider]  melatonin 5 MG TABS Take 2.5-5 mg by mouth at bedtime as needed (sleep).     [provider]  metFORMIN (GLUCOPHAGE-XR) 500 MG 24 hr tablet Take 1,000 mg by  mouth daily with supper.    [provider]  Multiple Vitamins-Minerals (MULTIVITAMIN WITH MINERALS) tablet Take 1 tablet by mouth daily.    [provider]  omeprazole (PRILOSEC) 20 MG capsule Take 20 mg by mouth daily as needed (acid reflux/indigestion.).    [provider]  Pegcetacoplan, Ophthalmic, 15 MG/0.1ML SOLN Injection in right eye every two months    [provider]  simvastatin (ZOCOR) 40 MG tablet Take 20 mg by mouth daily with supper.     [provider]  triamcinolone cream (KENALOG) 0.1 % Apply 1 application topically 2 (two) times daily as needed (psoriasis). 12/19/19   [provider]      Allergies    Patient has no known allergies.    Review of Systems   Review of Systems  Physical Exam Updated Vital Signs BP (!) 146/87   Pulse 98   Temp 97.6 F (36.4 C)   Resp 16   Ht '5\' 5"'$  (1.651 m)   Wt 106 kg   SpO2 97%   BMI 38.89 kg/m  Physical Exam Constitutional:      General: She is not in acute distress.    Appearance: She is obese.  HENT:     Head: Normocephalic and atraumatic.  Eyes:     Conjunctiva/sclera: Conjunctivae normal.  Pupils: Pupils are equal, round, and reactive to light.  Cardiovascular:     Rate and Rhythm: Normal rate and regular rhythm.  Pulmonary:     Effort: Pulmonary effort is normal. No respiratory distress.  Abdominal:     General: There is no distension.     Tenderness: There is no abdominal tenderness.  Musculoskeletal:     Comments: Tenderness and pain with any movement of the right hip.  No visible deformity of the extremities. Hematoma to right elbow, without focal bony tenderness, full range of motion of the elbow and upper extremities  Skin:    General: Skin is warm and dry.  Neurological:     General: No focal deficit present.     Mental Status: She is alert. Mental status is at baseline.  Psychiatric:        Mood and Affect: Mood normal.        Behavior: Behavior  normal.     ED Results / Procedures / Treatments   Labs (all labs ordered are listed, but only abnormal results are displayed) Labs Reviewed  BASIC METABOLIC PANEL - Abnormal; Notable for the following components:      Result Value   Sodium 132 (*)    Chloride 95 (*)    Glucose, Bld 209 (*)    Creatinine, Ser 1.18 (*)    GFR, Estimated 46 (*)    All other components within normal limits  CBC - Abnormal; Notable for the following components:   WBC 13.8 (*)    Hemoglobin 15.4 (*)    All other components within normal limits    EKG None  Radiology CT Head Wo Contrast  Result Date: 01/26/2022 CLINICAL DATA:  Fall, on Eliquis EXAM: CT HEAD WITHOUT CONTRAST TECHNIQUE: Contiguous axial images were obtained from the base of the skull through the vertex without intravenous contrast. RADIATION DOSE REDUCTION: This exam was performed according to the departmental dose-optimization program which includes automated exposure control, adjustment of the mA and/or kV according to patient size and/or use of iterative reconstruction technique. COMPARISON:  None Available. FINDINGS: Brain: No evidence of acute infarction, hemorrhage, hydrocephalus, extra-axial collection or mass lesion/mass effect. Global cortical atrophy. Subcortical white matter and periventricular small vessel ischemic changes. Vascular: No hyperdense vessel or unexpected calcification. Skull: Normal. Negative for fracture or focal lesion. Sinuses/Orbits: The visualized paranasal sinuses are essentially clear. The mastoid air cells are unopacified. Other: None. IMPRESSION: No evidence of acute intracranial abnormality. Atrophy with small vessel ischemic changes. Electronically Signed   By: Julian Hy M.D.   On: 01/26/2022 22:23   DG Hip Unilat W or Wo Pelvis 2-3 Views Right  Result Date: 01/26/2022 CLINICAL DATA:  Right hip injury.  Fracture suspected. EXAM: DG HIP (WITH OR WITHOUT PELVIS) 2-3V RIGHT COMPARISON:  AP pelvis  11/06/2013 FINDINGS: The bone mineralization is osteopenic. There is an acute transverse mid cervical to basicervical right femoral neck fracture. The distal fragment demonstrates impaction and mild cephalad displacement with mild rotation. No pelvic fracture or diastasis is seen. The proximal left femur is unremarkable. There is mild nonerosive degenerative arthrosis of the SI joints, pubic symphysis and hips, facet hypertrophy at the lowest 2 lumbar levels. There are additional findings of enthesopathy of the lateral pelvic wings and greater trochanters. No obvious soft tissue abnormality. IMPRESSION: 1. Acute transverse mid cervical to basicervical right femoral neck fracture with impaction and mild cephalad displacement and rotation of the distal fragment. 2. Osteopenia and degenerative changes. Electronically Signed  By: Telford Nab M.D.   On: 01/26/2022 22:04    Procedures Procedures    Medications Ordered in ED Medications  ondansetron [facility]) injection 4 mg (has no administration in time range)  morphine (PF) 4 MG/ML injection 4 mg (4 mg Intravenous Given 01/26/22 2129)    ED Course/ Medical Decision Making/ A&P Clinical Course as of 01/26/22 2229  Sun Jan 26, 2022  2209 Femoral neck fracture noted on hip.  Will discuss with orthopedics, plan for medical admission [MT]  2227 Spoke to Dr Alvan Dame from orthopedics who advised medical admission, consultation of local orthopedic surgeon in the morning.  No indication for emergency surgery.  Patient's family members, 2 daughters, now present at the bedside are updated in agreement the plan for admission.  The patient does have multiple medical comorbidities likely require preoperative clearance if there is consideration of an operation. [MT]    Clinical Course User Index [MT] Andromeda Poppen, Carola Rhine, MD                           Medical Decision Making Amount and/or Complexity of Data Reviewed Labs: ordered. Radiology:  ordered.  Risk Prescription drug management. Decision regarding hospitalization.   Patient is here with isolated injury of the right hip and also minor head injury.  She is on Eliquis.  CT scan of the head and x-ray of the hip is been ordered.  IV morphine ordered for pain.   I personally reviewed & interpreted the patient's imaging, notable for comminuted closed right femoral neck fracture.  CT head shows no acute intracranial bleed or injury.  Patient has a hematoma of the right elbow without underlying bony tenderness or any limited range of motion to suggest a fracture.  Supplemental history is provided by EMS as well as the patient's daughters were present at the bedside.  I personally reviewed the patient's labs.  No emergent findings at this time.  Very mild hyponatremia.  White blood cell count 13.8 which may be reactive from her fall and injury.  Morphine and Zofran ordered for pain and nausea.  Orthopedics consulted as noted in ED course.  Plan for medical admission.        Final Clinical Impression(s) / ED Diagnoses Final diagnoses:  Closed fracture of neck of right femur, initial encounter [facility])    Rx / DC Orders ED Discharge Orders     None         Wyvonnia Dusky, MD 01/26/22 2229

## 2022-01-27 DIAGNOSIS — D72829 Elevated white blood cell count, unspecified: Secondary | ICD-10-CM | POA: Insufficient documentation

## 2022-01-27 DIAGNOSIS — N1831 Chronic kidney disease, stage 3a: Secondary | ICD-10-CM | POA: Insufficient documentation

## 2022-01-27 DIAGNOSIS — E1165 Type 2 diabetes mellitus with hyperglycemia: Secondary | ICD-10-CM | POA: Insufficient documentation

## 2022-01-27 DIAGNOSIS — E871 Hypo-osmolality and hyponatremia: Secondary | ICD-10-CM | POA: Insufficient documentation

## 2022-01-27 DIAGNOSIS — I482 Chronic atrial fibrillation, unspecified: Secondary | ICD-10-CM | POA: Insufficient documentation

## 2022-01-27 DIAGNOSIS — S72001A Fracture of unspecified part of neck of right femur, initial encounter for closed fracture: Secondary | ICD-10-CM | POA: Diagnosis not present

## 2022-01-27 DIAGNOSIS — K219 Gastro-esophageal reflux disease without esophagitis: Secondary | ICD-10-CM | POA: Insufficient documentation

## 2022-01-27 DIAGNOSIS — E669 Obesity, unspecified: Secondary | ICD-10-CM | POA: Insufficient documentation

## 2022-01-27 DIAGNOSIS — G4733 Obstructive sleep apnea (adult) (pediatric): Secondary | ICD-10-CM

## 2022-01-27 LAB — CBC
HCT: 42.4 % (ref 36.0–46.0)
Hemoglobin: 14.5 g/dL (ref 12.0–15.0)
MCH: 32.4 pg (ref 26.0–34.0)
MCHC: 34.2 g/dL (ref 30.0–36.0)
MCV: 94.9 fL (ref 80.0–100.0)
Platelets: 218 10*3/uL (ref 150–400)
RBC: 4.47 MIL/uL (ref 3.87–5.11)
RDW: 13.4 % (ref 11.5–15.5)
WBC: 11 10*3/uL — ABNORMAL HIGH (ref 4.0–10.5)
nRBC: 0 % (ref 0.0–0.2)

## 2022-01-27 LAB — COMPREHENSIVE METABOLIC PANEL
ALT: 28 U/L (ref 0–44)
AST: 30 U/L (ref 15–41)
Albumin: 3.5 g/dL (ref 3.5–5.0)
Alkaline Phosphatase: 53 U/L (ref 38–126)
Anion gap: 9 (ref 5–15)
BUN: 14 mg/dL (ref 8–23)
CO2: 28 mmol/L (ref 22–32)
Calcium: 9.7 mg/dL (ref 8.9–10.3)
Chloride: 98 mmol/L (ref 98–111)
Creatinine, Ser: 1.01 mg/dL — ABNORMAL HIGH (ref 0.44–1.00)
GFR, Estimated: 55 mL/min — ABNORMAL LOW (ref >60)
Glucose, Bld: 182 mg/dL — ABNORMAL HIGH (ref 70–99)
Potassium: 3.9 mmol/L (ref 3.5–5.1)
Sodium: 135 mmol/L (ref 135–145)
Total Bilirubin: 0.8 mg/dL (ref 0.3–1.2)
Total Protein: 7 g/dL (ref 6.5–8.1)

## 2022-01-27 LAB — MAGNESIUM: Magnesium: 1.7 mg/dL (ref 1.7–2.4)

## 2022-01-27 LAB — GLUCOSE, CAPILLARY
Glucose-Capillary: 173 mg/dL — ABNORMAL HIGH (ref 70–99)
Glucose-Capillary: 215 mg/dL — ABNORMAL HIGH (ref 70–99)
Glucose-Capillary: 217 mg/dL — ABNORMAL HIGH (ref 70–99)
Glucose-Capillary: 221 mg/dL — ABNORMAL HIGH (ref 70–99)

## 2022-01-27 LAB — HEMOGLOBIN A1C
Hgb A1c MFr Bld: 7.6 % — ABNORMAL HIGH (ref 4.8–5.6)
Mean Plasma Glucose: 171.42 mg/dL

## 2022-01-27 LAB — PHOSPHORUS: Phosphorus: 3 mg/dL (ref 2.5–4.6)

## 2022-01-27 MED ORDER — POLYETHYLENE GLYCOL 3350 17 G PO PACK
17.0000 g | PACK | Freq: Every day | ORAL | Status: DC
  Administered 2022-01-27 – 2022-01-28 (×2): 17 g via ORAL
  Filled 2022-01-27 (×2): qty 1

## 2022-01-27 MED ORDER — METHOCARBAMOL 500 MG PO TABS
500.0000 mg | ORAL_TABLET | Freq: Three times a day (TID) | ORAL | Status: DC
  Administered 2022-01-27 – 2022-01-29 (×7): 500 mg via ORAL
  Filled 2022-01-27 (×7): qty 1

## 2022-01-27 MED ORDER — MORPHINE SULFATE (PF) 2 MG/ML IV SOLN
2.0000 mg | INTRAVENOUS | Status: DC | PRN
  Administered 2022-01-27 – 2022-01-29 (×7): 2 mg via INTRAVENOUS
  Filled 2022-01-27 (×7): qty 1

## 2022-01-27 MED ORDER — OXYCODONE HCL 5 MG PO TABS
5.0000 mg | ORAL_TABLET | ORAL | Status: DC | PRN
  Administered 2022-01-27 – 2022-01-28 (×2): 5 mg via ORAL
  Filled 2022-01-27 (×2): qty 1

## 2022-01-27 MED ORDER — SENNOSIDES-DOCUSATE SODIUM 8.6-50 MG PO TABS
2.0000 | ORAL_TABLET | Freq: Every day | ORAL | Status: DC
  Administered 2022-01-27 – 2022-01-28 (×2): 2 via ORAL
  Filled 2022-01-27 (×2): qty 2

## 2022-01-27 NOTE — Progress Notes (Signed)
PROGRESS NOTE     Miranda Wilkinson, is a 86 y.o. female, DOB - 18-Dec-1935, KWI:[id number]  Admit date - 01/26/2022   Admitting Physician Bernadette Hoit, DO  Outpatient Primary MD for the patient is Caryl Bis, MD  LOS - 1  Chief Complaint  Patient presents with   Fall        Brief Narrative:   86 y.o. female with medical history significant of hypertension, hyperlipidemia, T2DM, atrial fibrillation on Eliquis, hypothyroidism, GERD, CKD 3A, HOH 10 on 01/26/2022 with right hip fracture after mechanical fall    -Assessment and Plan:  1)Closed displaced fracture of right femoral neck -Last dose of Eliquis was PM dose of 01/26/2022 -Orthopedic surgeon/unable to do surgical correction until 01/29/2022 to allow for Eliquis washout -Pain control   2)Leukocytosis possibly reactive WBC 13.8 >> 11.0  3)DM2--  Patient 1.6 reflecting uncontrolled diabetes with hyperglycemia Use Novolog/Humalog Sliding scale insulin with Accu-Cheks/Fingersticks as ordered    4)Mild Hyponatremia -Resolved -Continue to hold diuretics   5)Chronic atrial fibrillation Continue bisoprolol Eliquis will be temporarily held at this time due to anticipated surgical repair of right femoral neck fracture   6)Essential hypertension Continue bisoprolol and lisinopril   7)Mixed hyperlipidemia Continue Zocor   8)Acquired hypothyroidism Continue Synthroid   9)GERD Continue Protonix   10)CKD 3A -Renal function appears to be stable Renally adjust medications, avoid nephrotoxic agents/dehydration/hypotension   11) class II obesity-/OSA -Low calorie diet, portion control and increase physical activity discussed with patient -Body mass index is 38.89 kg/m. -CPAP nightly   Disposition/Need for in-Hospital Stay- patient unable to be discharged at this time due to -right hip fracture awaiting surgical correction after allowing for Eliquis washout*  Status is: Inpatient   Disposition: The patient is  from: Home              Anticipated d/c is to: SNF              Anticipated d/c date is: > 3 days              Patient currently is not medically stable to d/c. Barriers: Not Clinically Stable-   Code Status :  -  Code Status: Full Code   Family Communication:    Family at bedside DVT Prophylaxis  :   - SCDs   SCDs Start: 01/27/22 0723   Lab Results  Component Value Date   PLT 218 01/27/2022    Inpatient Medications  Scheduled Meds:  bisoprolol  20 mg Oral Daily   insulin aspart  0-9 Units Subcutaneous TID WC   levothyroxine  112 mcg Oral Q0600   lisinopril  10 mg Oral Daily   pantoprazole  40 mg Oral Daily   simvastatin  20 mg Oral Q supper   Continuous Infusions: PRN Meds:.melatonin, morphine injection   Anti-infectives (From admission, onward)    None         Subjective: Autumn Messing today has no fevers, no emesis,  No chest pain,   -c/o Right lower extremity pain Daughter at bedside Just had a BM  Objective: Vitals:   01/26/22 2155 01/26/22 2343 01/27/22 0356 01/27/22 1440  BP: (!) 146/87 (!) 157/102 135/75 (!) 151/81  Pulse: 98 87 89 79  Resp: '16 18  17  '$ Temp:  98 F (36.7 C) 98.1 F (36.7 C) 98.2 F (36.8 C)  TempSrc:  Oral Oral Oral  SpO2: 97% 98% 97% 96%  Weight:      Height:  No intake or output data in the 24 hours ending 01/27/22 1716 Filed Weights   01/26/22 2102  Weight: 106 kg   Physical Exam  Gen:- Awake Alert,  in no apparent distress HEENT:- [place].AT, No sclera icterus Ears--very HOH Neck-Supple Neck,No JVD,.  Lungs-  CTAB , fair symmetrical air movement CV- S1, S2 normal, regular  Abd-  +ve B.Sounds, Abd Soft, No tenderness,    Extremity/Skin:- No  edema, pedal pulses present  Psych-affect is appropriate, oriented x3 Neuro-no new focal deficits, no tremors MSK-right lower extremity shortened and rotated and painful with palpation and range of motion -Right elbow hematoma without any restrictive ROM.   Data Reviewed: I  have personally reviewed following labs and imaging studies  CBC: Recent Labs  Lab 01/26/22 2135 01/27/22 0849  WBC 13.8* 11.0*  HGB 15.4* 14.5  HCT 45.3 42.4  MCV 94.8 94.9  PLT 245 939   Basic Metabolic Panel: Recent Labs  Lab 01/26/22 2135 01/27/22 0849  NA 132* 135  K 3.7 3.9  CL 95* 98  CO2 25 28  GLUCOSE 209* 182*  BUN 15 14  CREATININE 1.18* 1.01*  CALCIUM 9.8 9.7  MG  --  1.7  PHOS  --  3.0   GFR: Estimated Creatinine Clearance: 50.1 mL/min (A) (by C-G formula based on SCr of 1.01 mg/dL (H)). Liver Function Tests: Recent Labs  Lab 01/27/22 0849  AST 30  ALT 28  ALKPHOS 53  BILITOT 0.8  PROT 7.0  ALBUMIN 3.5   HbA1C: Recent Labs    01/26/22 2341  HGBA1C 7.6*   Radiology Studies: CT Head Wo Contrast  Result Date: 01/26/2022 CLINICAL DATA:  Fall, on Eliquis EXAM: CT HEAD WITHOUT CONTRAST TECHNIQUE: Contiguous axial images were obtained from the base of the skull through the vertex without intravenous contrast. RADIATION DOSE REDUCTION: This exam was performed according to the departmental dose-optimization program which includes automated exposure control, adjustment of the mA and/or kV according to patient size and/or use of iterative reconstruction technique. COMPARISON:  None Available. FINDINGS: Brain: No evidence of acute infarction, hemorrhage, hydrocephalus, extra-axial collection or mass lesion/mass effect. Global cortical atrophy. Subcortical white matter and periventricular small vessel ischemic changes. Vascular: No hyperdense vessel or unexpected calcification. Skull: Normal. Negative for fracture or focal lesion. Sinuses/Orbits: The visualized paranasal sinuses are essentially clear. The mastoid air cells are unopacified. Other: None. IMPRESSION: No evidence of acute intracranial abnormality. Atrophy with small vessel ischemic changes. Electronically Signed   By: Julian Hy M.D.   On: 01/26/2022 22:23   DG Hip Unilat W or Wo Pelvis 2-3  Views Right  Result Date: 01/26/2022 CLINICAL DATA:  Right hip injury.  Fracture suspected. EXAM: DG HIP (WITH OR WITHOUT PELVIS) 2-3V RIGHT COMPARISON:  AP pelvis 11/06/2013 FINDINGS: The bone mineralization is osteopenic. There is an acute transverse mid cervical to basicervical right femoral neck fracture. The distal fragment demonstrates impaction and mild cephalad displacement with mild rotation. No pelvic fracture or diastasis is seen. The proximal left femur is unremarkable. There is mild nonerosive degenerative arthrosis of the SI joints, pubic symphysis and hips, facet hypertrophy at the lowest 2 lumbar levels. There are additional findings of enthesopathy of the lateral pelvic wings and greater trochanters. No obvious soft tissue abnormality. IMPRESSION: 1. Acute transverse mid cervical to basicervical right femoral neck fracture with impaction and mild cephalad displacement and rotation of the distal fragment. 2. Osteopenia and degenerative changes. Electronically Signed   By: Ninfa Linden.D.  On: 01/26/2022 22:04    Scheduled Meds:  bisoprolol  20 mg Oral Daily   insulin aspart  0-9 Units Subcutaneous TID WC   levothyroxine  112 mcg Oral Q0600   lisinopril  10 mg Oral Daily   pantoprazole  40 mg Oral Daily   simvastatin  20 mg Oral Q supper   Continuous Infusions:   LOS: 1 day    [name] M.D on 01/27/2022 at 5:16 PM  Go to [email or web address] - for contact info  [facility] - Office  [phone]  If 7PM-7AM, please contact night-coverage [email or web address] 01/27/2022, 5:16 PM

## 2022-01-27 NOTE — TOC Initial Note (Signed)
Transition of Care [facility]) - Initial/Assessment Note    Patient Details  Name: Miranda Wilkinson MRN: [id number] Date of Birth: 1935/11/01  [facility]) CM/SW Contact:    Salome Arnt, [place] Phone Number: 01/27/2022, 2:57 PM  Clinical Narrative: Pt admitted due to hip fracture. Assessment completed with pt's daughter, [name] at pt's request. [name] reports pt has been staying with her in [place] recently. She typically ambulates with a walker and is fairly independent with ADLs. Pt requires 2L home O2. LCSW discussed that after surgery, PT will evaluate and make recommendations, but pt will likely need SNF. Pt's daughter reports understanding. Per MD, surgery will be Wednesday at earliest. [facility] will continue to follow.                     Barriers to Discharge: Continued Medical Work up   Patient Goals and CMS Choice Patient states their goals for this hospitalization and ongoing recovery are:: to be determine   Choice offered to / list presented to : Adult Children  Expected Discharge Plan and Services   In-house Referral: Clinical Social Work     Living arrangements for the past 2 months: Single Family Home                                      Prior Living Arrangements/Services Living arrangements for the past 2 months: Single Family Home Lives with:: Adult Children Patient language and need for interpreter reviewed:: Yes Do you feel safe going back to the place where you live?: Yes      Need for Family Participation in Patient Care: Yes (Comment)   Current home services: DME (cane, walker, wheelchair, cpap, O2) Criminal Activity/Legal Involvement Pertinent to Current Situation/Hospitalization: No - Comment as needed  Activities of Daily Living Home Assistive Devices/Equipment: Walker (specify type), Cane (specify quad or straight), Oxygen, CPAP ADL Screening (condition at time of admission) Patient's cognitive ability adequate to safely  complete daily activities?: Yes Is the patient deaf or have difficulty hearing?: Yes Does the patient have difficulty seeing, even when wearing glasses/contacts?: Yes Does the patient have difficulty concentrating, remembering, or making decisions?: No Patient able to express need for assistance with ADLs?: Yes Does the patient have difficulty dressing or bathing?: No Independently performs ADLs?: Yes (appropriate for developmental age) Does the patient have difficulty walking or climbing stairs?: Yes Weakness of Legs: Both Weakness of Arms/Hands: None  Permission Sought/Granted                  Emotional Assessment     Affect (typically observed): Appropriate Orientation: : Oriented to Self, Oriented to Place, Oriented to  Time, Oriented to Situation Alcohol / Substance Use: Not Applicable Psych Involvement: No (comment)  Admission diagnosis:  Closed fracture of neck of right femur, initial encounter ([place]) [S72.001A] Closed displaced fracture of right femoral neck ([place]) [S72.001A] Patient Active Problem List   Diagnosis Date Noted   Type 2 diabetes mellitus with hyperglycemia ([place]) 01/27/2022   Hyponatremia 01/27/2022   Leukocytosis 01/27/2022   Atrial fibrillation, chronic ([place]) 01/27/2022   GERD (gastroesophageal reflux disease) 01/27/2022   OSA on CPAP 01/27/2022   Obesity (BMI 30-39.9) 01/27/2022   Chronic kidney disease, stage 3a ([place]) 01/27/2022   Closed displaced fracture of right femoral neck ([place]) 01/26/2022   Severe sepsis ([place]) 12/08/2019   Diarrhea 12/08/2019   AKI (acute kidney  injury) ([place]) 12/08/2019   Atrial fibrillation with rapid ventricular response ([place]) 12/08/2019   Sepsis ([place]) 12/08/2019   Iron deficiency anemia 11/05/2019   Symptomatic anemia 11/05/2019   Atypical atrial flutter ([place]) 11/03/2019   Persistent atrial fibrillation ([place]) 10/14/2019   Secondary hypercoagulable state ([place]) 10/14/2019   COPD GOLD 0/ group B 09/26/2019   Morbid  obesity due to excess calories ([place]) comp by hyperlipidemia/ dm/ hbp 09/26/2019   Depression 11/22/2013   Mixed hyperlipidemia 11/22/2013   Allergic rhinitis 11/22/2013   Acquired hypothyroidism 11/08/2013   Diabetes ([place]) 11/08/2013   Essential hypertension, benign 11/08/2013   Closed right ankle fracture 11/03/2013   PCP:  Caryl Bis, MD Pharmacy:   [facility] [place], [place] [address] Phone: [phone] Fax: [phone]  [facility] [place], [place] [address] [place] [address] Phone: [phone] Fax: [phone]     Social Determinants of Health (SDOH) Interventions    Readmission Risk Interventions     No data to display

## 2022-01-28 ENCOUNTER — Telehealth: Payer: Self-pay

## 2022-01-28 ENCOUNTER — Encounter (HOSPITAL_COMMUNITY): Payer: Self-pay | Admitting: Certified Registered Nurse Anesthetist

## 2022-01-28 DIAGNOSIS — S72001A Fracture of unspecified part of neck of right femur, initial encounter for closed fracture: Secondary | ICD-10-CM | POA: Diagnosis not present

## 2022-01-28 LAB — GLUCOSE, CAPILLARY
Glucose-Capillary: 198 mg/dL — ABNORMAL HIGH (ref 70–99)
Glucose-Capillary: 190 mg/dL — ABNORMAL HIGH (ref 70–99)
Glucose-Capillary: 167 mg/dL — ABNORMAL HIGH (ref 70–99)
Glucose-Capillary: 208 mg/dL — ABNORMAL HIGH (ref 70–99)

## 2022-01-28 MED ORDER — ENOXAPARIN SODIUM 40 MG/0.4ML IJ SOSY
40.0000 mg | PREFILLED_SYRINGE | INTRAMUSCULAR | Status: DC
  Administered 2022-01-28: 40 mg via SUBCUTANEOUS
  Filled 2022-01-28: qty 0.4

## 2022-01-28 MED ORDER — OXYCODONE HCL 5 MG PO TABS
10.0000 mg | ORAL_TABLET | ORAL | Status: DC | PRN
  Administered 2022-01-28 – 2022-01-30 (×6): 10 mg via ORAL
  Filled 2022-01-28 (×7): qty 2

## 2022-01-28 MED ORDER — POLYETHYLENE GLYCOL 3350 17 G PO PACK
17.0000 g | PACK | Freq: Two times a day (BID) | ORAL | Status: DC
  Administered 2022-01-28 – 2022-01-29 (×4): 17 g via ORAL
  Filled 2022-01-28 (×4): qty 1

## 2022-01-28 MED ORDER — TRAZODONE HCL 100 MG PO TABS
100.0000 mg | ORAL_TABLET | Freq: Every day | ORAL | Status: DC
  Administered 2022-01-28 – 2022-02-04 (×8): 100 mg via ORAL
  Filled 2022-01-28 (×11): qty 1

## 2022-01-28 MED ORDER — ENOXAPARIN SODIUM 40 MG/0.4ML IJ SOSY: 40.0000 mg | PREFILLED_SYRINGE | Freq: Once | INTRAMUSCULAR | Status: DC

## 2022-01-28 NOTE — Progress Notes (Addendum)
Patient has been hollering "help me the whole night when she is awake. Pain medicine given as needed, patient is only alert to self this morning unlike last night. Patient is looking for a certain "Renee", re-oriented patient but continues to scream help me again within few seconds of re-orientation.   At 0542 informed Dr. Josephine Cables of changes.

## 2022-01-28 NOTE — Progress Notes (Signed)
[facility] called for transport to [place] 

## 2022-01-28 NOTE — Progress Notes (Signed)
Attempted to call report to [facility] long x1

## 2022-01-28 NOTE — Plan of Care (Signed)
  Problem: Coping: Goal: Level of anxiety will decrease Outcome: Not Progressing   Problem: Elimination: Goal: Will not experience complications related to bowel motility Outcome: Not Progressing   Problem: Safety: Goal: Ability to remain free from injury will improve Outcome: Not Progressing   Problem: Pain Managment: Goal: General experience of comfort will improve Outcome: Not Progressing   Problem: Skin Integrity: Goal: Risk for impaired skin integrity will decrease Outcome: Not Progressing

## 2022-01-28 NOTE — Telephone Encounter (Signed)
Situation has already been addressed by Dr. Amedeo Kinsman.

## 2022-01-28 NOTE — Progress Notes (Signed)
Please see prior full progress note dated 01/28/2022--  Addendum-  -Spoke with patient, patient's daughter Miranda Wilkinson at bedside and patient's daughter Miranda Wilkinson who was on speaker phone ----family requested transfer to [facility] for orthopedic fixation of left hip fracture - Case discussed with Dr. Milagros Reap from [facility] and Dr. Amedeo Kinsman (orthopedic surgeon at [facility]) - Transfer to [facility] as requested by family and by Dr. Milagros Reap initiated -Eliquis remains on hold last dose was p.m. dose of 01/26/2022 - As Per Dr Orest Dikes will be 01/31/2022... As per Dr Milagros Reap okay to give prophylactic Lovenox through 01/30/2022  Please see prior full progress note dated 01/28/2022-- - [name], MD

## 2022-01-28 NOTE — Telephone Encounter (Signed)
Patient's daughter left message needing to ask Dr. Amedeo Kinsman if he would please give the heart doctor for her mom a call.  His name is Carlyle Dolly, phone [phone]. This is in regards to her clearance for hip surgery tomorrow. Patient has permanent AFIB and Dr. Harl Bowie would like to speak with Dr. Amedeo Kinsman. Daughter would like a return call at [phone]

## 2022-01-28 NOTE — Consult Note (Signed)
ORTHOPAEDIC CONSULTATION  REQUESTING PHYSICIAN: [name], MD  ASSESSMENT AND PLAN: 86 y.o. female with the following: Right Hip Basicervical femoral neck fracture; possibly a displaced femoral neck fracture   This patient requires inpatient admission to the hospitalist, to include preoperative clearance and perioperative medical management  Patient takes Eliquis, last dose was the day of admission, around 7:00 pm.  We will have to wait a least 48 hours following the last dose before proceeding with surgery.  Based on available imaging at this time, fracture could either be a basicervical femoral neck fracture versus in the mid cervical femoral neck fracture.  Patient was posted for a intramedullary nail, with the possibility of adjusting to a right hip hemiarthroplasty depending on the intraoperative imaging.  This will be determined prior to starting surgery.  This was discussed with the patient and her daughters.  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 Alvan Dame.  Coincidentally, Dr. Alvan Dame was on-call on Sunday evening, and is already familiar with her case.  At this time, the family is requesting transfer to [facility] to proceed with a total hip arthroplasty.  I have reached out to Dr. Alvan Dame through his office.  Did not discuss treatment with him directly, but he has been in contact with the hospitalist.  Dr. Alvan Dame will take care of the patient and is accepting the transfer.   - Weight Bearing Status/Activity: NWB Right lower extremity  - Additional recommended labs/tests: Preop Labs: CBC, BMP, PT/INR, Chest XR, and EKG  -VTE Prophylaxis: Please hold prior to OR; to resume POD#1 at the discretion of the primary team  - Pain control: Recommend PO pain medications PRN; judicious use of narcotics  - Follow-up plan: F/u 10-14 days postop  -Procedures: Plan  for OR once patient has been medically optimized  Plan for Right Hip Cephalomedullary nail vs hip hemiarthroplasty vs THA     Chief Complaint: Right hip pain  HPI: Miranda Wilkinson is a 86 y.o. female who presented to the ED for evaluation after sustaining a mechanical fall.  She was at her home, when she tripped on a rug.  She landed on her right side.  She hit her head.  CT scan of the head was negative.  She does take Eliquis for chronic A-fib.  She has pain in her right hip, presented to the emergency department for evaluation.  Dr. Alvan Dame evaluated the imaging, and recommended admission to [facility] with later fixation.  She continues to have pain in the right hip.  Pain gets worse with movement.  Pain medications have been helpful.  No numbness or tingling.  Past Medical History:  Diagnosis Date   Allergy    seasonal allergies   Anemia    IDA- on iron supplement   Anxiety    Atrial fibrillation (HCC)    Blood transfusion without reported diagnosis    2021- due to IDA/ low hemo (7.3)   Cancer ([place]) 1976   bilaterl thyroid ca   Diabetes mellitus without complication ([place])    on meds   GERD (gastroesophageal reflux disease)    on meds---OTC   Headache(784.0)    hx migraines   Hyperlipidemia    on meds   Hypertension    on meds   Hypothyroidism    thyroidectomy in 1976- on meds at  this time   On supplemental oxygen therapy  2 L at night   Shortness of breath    Sinus congestion    Sleep apnea    study done in [place], [place], use CPAP----with home O2 concentrater at night with CPAP   Past Surgical History:  Procedure Laterality Date   APPENDECTOMY     CARDIOVERSION N/A 10/27/2019   Procedure: CARDIOVERSION;  Surgeon: [name], MD;  Location: [facility] ENDOSCOPY;  Service: Cardiovascular;  Laterality: N/A;   CARDIOVERSION N/A 04/25/2020   Procedure: CARDIOVERSION;  Surgeon: Freada Bergeron, MD;  Location: [facility] ENDOSCOPY;  Service: Cardiovascular;  Laterality: N/A;    CHOLECYSTECTOMY     COLONOSCOPY WITH PROPOFOL N/A 01/09/2020   Procedure: COLONOSCOPY WITH PROPOFOL;  Surgeon: Doran Stabler, MD;  Location: [facility] ENDOSCOPY;  Service: Gastroenterology;  Laterality: N/A;   ESOPHAGOGASTRODUODENOSCOPY (EGD) WITH PROPOFOL N/A 01/09/2020   Procedure: ESOPHAGOGASTRODUODENOSCOPY (EGD) WITH PROPOFOL;  Surgeon: Doran Stabler, MD;  Location: [facility] ENDOSCOPY;  Service: Gastroenterology;  Laterality: N/A;   HEMOSTASIS CLIP PLACEMENT  01/09/2020   Procedure: HEMOSTASIS CLIP PLACEMENT;  Surgeon: Doran Stabler, MD;  Location: [facility] ENDOSCOPY;  Service: Gastroenterology;;   OPEN REDUCTION INTERNAL FIXATION (ORIF) TIBIA/FIBULA FRACTURE Right 11/03/2013   Procedure: OPEN REDUCTION INTERNAL FIXATION (ORIF) RIGHT TIBIA/FIBULA PILON FRACTURE;  Surgeon: Wylene Simmer, MD;  Location: [facility];  Service: Orthopedics;  Laterality: Right;   OVARIAN CYST SURGERY Left    age 52   POLYPECTOMY  01/09/2020   Procedure: POLYPECTOMY;  Surgeon: Doran Stabler, MD;  Location: [facility] ENDOSCOPY;  Service: Gastroenterology;;   THYROIDECTOMY Bilateral 1976   TUBAL LIGATION     Social History   Socioeconomic History   Marital status: Widowed    Spouse name: Not on file   Number of children: 2   Years of education: Not on file   Highest education level: Not on file  Occupational History   Occupation: retired  Tobacco Use   Smoking status: Former    Packs/day: 1.50    Years: 60.00    Total pack years: 90.00    Types: Cigarettes    Quit date: 11/03/2011    Years since quitting: 10.2    Passive exposure: Never   Smokeless tobacco: Never  Vaping Use   Vaping Use: Never used  Substance and Sexual Activity   Alcohol use: No   Drug use: No   Sexual activity: Not Currently  Other Topics Concern   Not on file  Social History Narrative   Not on file   Social Determinants of Health   Financial Resource Strain: Not on file  Food Insecurity: No Food Insecurity (01/26/2022)   Hunger  Vital Sign    Worried About Running Out of Food in the Last Year: Never true    [place] in the Last Year: Never true  Transportation Needs: No Transportation Needs (01/26/2022)   PRAPARE - Hydrologist (Medical): No    Lack of Transportation (Non-Medical): No  Physical Activity: Not on file  Stress: Not on file  Social Connections: Not on file   Family History  Problem Relation Age of Onset   Stroke Mother        brain hemmorhage   Diabetes Mother    Heart disease Mother    Thyroid disease Mother    Arrhythmia Mother    Stroke Father    Heart attack Father        blood clot in brain   Stroke Sister  Cancer Sister    Stomach cancer Brother 56   Hypertension Brother    Esophageal cancer Brother 56   Stroke Brother    Hypertension Brother    Arrhythmia Sister    Hypertension Sister    Colon polyps Neg Hx    Colon cancer Neg Hx    Rectal cancer Neg Hx    No Known Allergies Prior to Admission medications   Medication Sig Start Date End Date Taking? Authorizing Provider  acetaminophen (TYLENOL) 500 MG tablet Take 500 mg by mouth every 6 (six) hours as needed (for pain.).   Yes [provider]  bisoprolol (ZEBETA) 10 MG tablet Take 2 tablets (20 mg total) by mouth daily. 12/24/21  Yes BranchAlphonse Guild, MD  Cholecalciferol (VITAMIN D3) 50 MCG (2000 UT) capsule Take 2,000 Units by mouth daily.   Yes [provider]  ELIQUIS 5 MG TABS tablet Take 1 tablet (5 mg total) by mouth 2 (two) times daily. 09/24/21  Yes BranchAlphonse Guild, MD  GUAIFENESIN 1200 PO Take 1,200 mg by mouth 2 (two) times daily as needed (sinus congestion.).   Yes [provider]  hydrochlorothiazide (HYDRODIURIL) 25 MG tablet Take 1 tablet (25 mg total) by mouth daily. 01/20/22  Yes BranchAlphonse Guild, MD  levothyroxine (SYNTHROID) 112 MCG tablet Take 112 mcg by mouth daily before breakfast.    Yes [provider]  lisinopril (ZESTRIL) 10  MG tablet Take 10 mg by mouth daily. 08/02/20  Yes [provider]  loratadine (CLARITIN) 10 MG tablet Take 1 tablet by mouth daily as needed for allergies.   Yes [provider]  melatonin 5 MG TABS Take 2.5-5 mg by mouth at bedtime as needed (sleep).    Yes [provider]  metFORMIN (GLUCOPHAGE-XR) 500 MG 24 hr tablet Take 1,000 mg by mouth daily with supper.   Yes [provider]  Multiple Vitamins-Minerals (MULTIVITAMIN WITH MINERALS) tablet Take 1 tablet by mouth daily.   Yes [provider]  omeprazole (PRILOSEC) 20 MG capsule Take 20 mg by mouth daily as needed (acid reflux/indigestion.).   Yes [provider]  Pegcetacoplan, Ophthalmic, 15 MG/0.1ML SOLN Injection in right eye every two months   Yes [provider]  simvastatin (ZOCOR) 40 MG tablet Take 20 mg by mouth daily with supper.    Yes [provider]  triamcinolone cream (KENALOG) 0.1 % Apply 1 application topically 2 (two) times daily as needed (psoriasis). 12/19/19  Yes [provider]   CT Head Wo Contrast  Result Date: 01/26/2022 CLINICAL DATA:  Fall, on Eliquis EXAM: CT HEAD WITHOUT CONTRAST TECHNIQUE: Contiguous axial images were obtained from the base of the skull through the vertex without intravenous contrast. RADIATION DOSE REDUCTION: This exam was performed according to the departmental dose-optimization program which includes automated exposure control, adjustment of the mA and/or kV according to patient size and/or use of iterative reconstruction technique. COMPARISON:  None Available. FINDINGS: Brain: No evidence of acute infarction, hemorrhage, hydrocephalus, extra-axial collection or mass lesion/mass effect. Global cortical atrophy. Subcortical white matter and periventricular small vessel ischemic changes. Vascular: No hyperdense vessel or unexpected calcification. Skull: Normal. Negative for fracture or focal lesion. Sinuses/Orbits: The  visualized paranasal sinuses are essentially clear. The mastoid air cells are unopacified. Other: None. IMPRESSION: No evidence of acute intracranial abnormality. Atrophy with small vessel ischemic changes. Electronically Signed   By: Julian Hy M.D.   On: 01/26/2022 22:23   DG Hip Unilat W or Wo  Pelvis 2-3 Views Right  Result Date: 01/26/2022 CLINICAL DATA:  Right hip injury.  Fracture suspected. EXAM: DG HIP (WITH OR WITHOUT PELVIS) 2-3V RIGHT COMPARISON:  AP pelvis 11/06/2013 FINDINGS: The bone mineralization is osteopenic. There is an acute transverse mid cervical to basicervical right femoral neck fracture. The distal fragment demonstrates impaction and mild cephalad displacement with mild rotation. No pelvic fracture or diastasis is seen. The proximal left femur is unremarkable. There is mild nonerosive degenerative arthrosis of the SI joints, pubic symphysis and hips, facet hypertrophy at the lowest 2 lumbar levels. There are additional findings of enthesopathy of the lateral pelvic wings and greater trochanters. No obvious soft tissue abnormality. IMPRESSION: 1. Acute transverse mid cervical to basicervical right femoral neck fracture with impaction and mild cephalad displacement and rotation of the distal fragment. 2. Osteopenia and degenerative changes. Electronically Signed   By: Telford Nab M.D.   On: 01/26/2022 22:04     Family History Reviewed and non-contributory, no pertinent history of problems with bleeding or anesthesia    Review of Systems No fevers or chills No numbness or tingling No chest pain No shortness of breath No bowel or bladder dysfunction No GI distress No headaches    OBJECTIVE  Vitals:Patient Vitals for the past 8 hrs:  BP Temp Temp src Pulse Resp SpO2  01/28/22 1327 (!) 103/57 98.4 F (36.9 C) Oral 90 -- 93 %  01/28/22 0631 -- 99.1 F (37.3 C) Oral -- -- --  01/28/22 0619 (!) 93/47 100.3 F (37.9 C) Axillary 94 16 90 %   General: Alert,  no acute distress Cardiovascular: Extremities are warm Respiratory: No cyanosis, no use of accessory musculature Skin: No lesions in the area of chief complaint  Neurologic: Sensation intact distally  Psychiatric: Patient is competent for consent with normal mood and affect Lymphatic: No swelling obvious and reported other than the area involved in the exam below Extremities  RLE: Extremity held in a fixed position.  ROM deferred due to known fracture.  Sensation is intact distally in the sural, saphenous, DP, SP, and plantar nerve distribution. 2+ DP pulse.  Toes are WWP.  Active motion intact in the TA/EHL/GS. LLE: Sensation is intact distally in the sural, saphenous, DP, SP, and plantar nerve distribution. 2+ DP pulse.  Toes are WWP.  Active motion intact in the TA/EHL/GS. Tolerates gentle ROM of the hip.  No pain with axial loading.     Test Results Imaging XR of the Right hip demonstrates a fracture of the right femoral neck, with varus alignment.  Exact orientation is difficult to determine based on the AP imaging of the proximal femur.  Fracture may be basicervical versus mid cervical based on available imaging.  Labs cbc Recent Labs    01/26/22 2135 01/27/22 0849  WBC 13.8* 11.0*  HGB 15.4* 14.5  HCT 45.3 42.4  PLT 245 218      Recent Labs    01/26/22 2135 01/27/22 0849  NA 132* 135  K 3.7 3.9  CL 95* 98  CO2 25 28  GLUCOSE 209* 182*  BUN 15 14  CREATININE 1.18* 1.01*  CALCIUM 9.8 9.7

## 2022-01-28 NOTE — Progress Notes (Signed)
PROGRESS NOTE     Miranda Wilkinson, is a 86 y.o. female, DOB - 01-11-1936, SWH:[id number]  Admit date - 01/26/2022   Admitting Physician Bernadette Hoit, DO  Outpatient Primary MD for the patient is Caryl Bis, MD  LOS - 2  Chief Complaint  Patient presents with   Fall        Brief Narrative:   86 y.o. female with medical history significant of hypertension, hyperlipidemia, T2DM, atrial fibrillation on Eliquis, hypothyroidism, GERD, CKD 3A, HOH 10 on 01/26/2022 with right hip fracture after mechanical fall -awaiting surgical/operative correction on 01/29/22 after allowing for Eliquis washout     -Assessment and Plan:  1)Closed displaced fracture of right femoral neck -Last dose of Eliquis was PM dose of 01/26/2022 -Orthopedic surgeon/unable to do surgical correction until 01/29/2022 to allow for Eliquis washout -Pain control   2)Leukocytosis possibly reactive WBC 13.8 >> 11.0  3)DM2--  Patient 1.6 reflecting uncontrolled diabetes with hyperglycemia Use Novolog/Humalog Sliding scale insulin with Accu-Cheks/Fingersticks as ordered    4)Mild Hyponatremia -Resolved -Continue to hold diuretics   5)Chronic atrial fibrillation Continue bisoprolol Eliquis will be temporarily held at this time due to anticipated surgical repair of right femoral neck fracture   6)Essential hypertension Continue bisoprolol and lisinopril   7)Mixed hyperlipidemia Continue Zocor   8)Acquired hypothyroidism Continue Synthroid   9)GERD Continue Protonix   10)CKD 3A -Renal function appears to be stable Renally adjust medications, avoid nephrotoxic agents/dehydration/hypotension   11) class II obesity-/OSA -Low calorie diet, portion control and increase physical activity discussed with patient -Body mass index is 38.89 kg/m. -CPAP nightly   Disposition/Need for in-Hospital Stay- patient unable to be discharged at this time due to --Rt Hip Fx awaiting surgical/operative correction  on 01/29/22 after allowing for Eliquis washout  Status is: Inpatient   Disposition: The patient is from: Home              Anticipated d/c is to: SNF              Anticipated d/c date is: > 3 days              Patient currently is not medically stable to d/c. Barriers: Not Clinically Stable-   Code Status :  -  Code Status: Full Code   Family Communication:    Family at bedside DVT Prophylaxis  :   - SCDs   SCDs Start: 01/27/22 0723   Lab Results  Component Value Date   PLT 218 01/27/2022   Inpatient Medications  Scheduled Meds:  bisoprolol  20 mg Oral Daily   insulin aspart  0-9 Units Subcutaneous TID WC   levothyroxine  112 mcg Oral Q0600   lisinopril  10 mg Oral Daily   methocarbamol  500 mg Oral TID   pantoprazole  40 mg Oral Daily   polyethylene glycol  17 g Oral Daily   senna-docusate  2 tablet Oral QHS   simvastatin  20 mg Oral Q supper   Continuous Infusions: PRN Meds:.melatonin, morphine injection, oxyCODONE   Anti-infectives (From admission, onward)    None       Subjective: Autumn Messing today has no fevers, no emesis,  No chest pain,   - Rt LE pain with ROM and positional change---  Objective: Vitals:   01/27/22 1440 01/27/22 2052 01/28/22 0619 01/28/22 0631  BP: (!) 151/81 (!) 144/73 (!) 93/47   Pulse: 79 (!) 108 94   Resp: '17 17 16   '$ Temp: 98.2 F (36.8  C) 100.2 F (37.9 C) 100.3 F (37.9 C) 99.1 F (37.3 C)  TempSrc: Oral Oral Axillary Oral  SpO2: 96% 90% 90%   Weight:      Height:        Intake/Output Summary (Last 24 hours) at 01/28/2022 1113 Last data filed at 01/28/2022 0900 Gross per 24 hour  Intake 800 ml  Output 400 ml  Net 400 ml   Filed Weights   01/26/22 2102  Weight: 106 kg   Physical Exam  Gen:- Awake Alert,  in no apparent distress HEENT:- [place].AT, No sclera icterus Ears--very HOH Neck-Supple Neck,No JVD,.  Lungs-  CTAB , fair symmetrical air movement CV- S1, S2 normal, regular  Abd-  +ve B.Sounds, Abd Soft,  No tenderness,    Extremity/Skin:--Pedal pulses present  Psych-affect is appropriate, oriented x3 Neuro-Generalized weakness, no new focal deficits, no tremors MSK-right lower extremity shortened and rotated and painful with palpation and range of motion -Right elbow hematoma without any restrictive ROM.   Data Reviewed: I have personally reviewed following labs and imaging studies  CBC: Recent Labs  Lab 01/26/22 2135 01/27/22 0849  WBC 13.8* 11.0*  HGB 15.4* 14.5  HCT 45.3 42.4  MCV 94.8 94.9  PLT 245 814   Basic Metabolic Panel: Recent Labs  Lab 01/26/22 2135 01/27/22 0849  NA 132* 135  K 3.7 3.9  CL 95* 98  CO2 25 28  GLUCOSE 209* 182*  BUN 15 14  CREATININE 1.18* 1.01*  CALCIUM 9.8 9.7  MG  --  1.7  PHOS  --  3.0   GFR: Estimated Creatinine Clearance: 50.1 mL/min (A) (by C-G formula based on SCr of 1.01 mg/dL (H)). Liver Function Tests: Recent Labs  Lab 01/27/22 0849  AST 30  ALT 28  ALKPHOS 53  BILITOT 0.8  PROT 7.0  ALBUMIN 3.5   HbA1C: Recent Labs    01/26/22 2341  HGBA1C 7.6*   Radiology Studies: CT Head Wo Contrast  Result Date: 01/26/2022 CLINICAL DATA:  Fall, on Eliquis EXAM: CT HEAD WITHOUT CONTRAST TECHNIQUE: Contiguous axial images were obtained from the base of the skull through the vertex without intravenous contrast. RADIATION DOSE REDUCTION: This exam was performed according to the departmental dose-optimization program which includes automated exposure control, adjustment of the mA and/or kV according to patient size and/or use of iterative reconstruction technique. COMPARISON:  None Available. FINDINGS: Brain: No evidence of acute infarction, hemorrhage, hydrocephalus, extra-axial collection or mass lesion/mass effect. Global cortical atrophy. Subcortical white matter and periventricular small vessel ischemic changes. Vascular: No hyperdense vessel or unexpected calcification. Skull: Normal. Negative for fracture or focal lesion.  Sinuses/Orbits: The visualized paranasal sinuses are essentially clear. The mastoid air cells are unopacified. Other: None. IMPRESSION: No evidence of acute intracranial abnormality. Atrophy with small vessel ischemic changes. Electronically Signed   By: Julian Hy M.D.   On: 01/26/2022 22:23   DG Hip Unilat W or Wo Pelvis 2-3 Views Right  Result Date: 01/26/2022 CLINICAL DATA:  Right hip injury.  Fracture suspected. EXAM: DG HIP (WITH OR WITHOUT PELVIS) 2-3V RIGHT COMPARISON:  AP pelvis 11/06/2013 FINDINGS: The bone mineralization is osteopenic. There is an acute transverse mid cervical to basicervical right femoral neck fracture. The distal fragment demonstrates impaction and mild cephalad displacement with mild rotation. No pelvic fracture or diastasis is seen. The proximal left femur is unremarkable. There is mild nonerosive degenerative arthrosis of the SI joints, pubic symphysis and hips, facet hypertrophy at the lowest 2 lumbar levels. There  are additional findings of enthesopathy of the lateral pelvic wings and greater trochanters. No obvious soft tissue abnormality. IMPRESSION: 1. Acute transverse mid cervical to basicervical right femoral neck fracture with impaction and mild cephalad displacement and rotation of the distal fragment. 2. Osteopenia and degenerative changes. Electronically Signed   By: Telford Nab M.D.   On: 01/26/2022 22:04    Scheduled Meds:  bisoprolol  20 mg Oral Daily   insulin aspart  0-9 Units Subcutaneous TID WC   levothyroxine  112 mcg Oral Q0600   lisinopril  10 mg Oral Daily   methocarbamol  500 mg Oral TID   pantoprazole  40 mg Oral Daily   polyethylene glycol  17 g Oral Daily   senna-docusate  2 tablet Oral QHS   simvastatin  20 mg Oral Q supper   Continuous Infusions:   LOS: 2 days   [name] M.D on 01/28/2022 at 11:13 AM  Go to [email or web address] - for contact info  [facility] - Office  [phone]  If 7PM-7AM, please contact  night-coverage [email or web address] 01/28/2022, 11:13 AM

## 2022-01-29 ENCOUNTER — Inpatient Hospital Stay (HOSPITAL_COMMUNITY): Payer: Medicare Other

## 2022-01-29 ENCOUNTER — Encounter (HOSPITAL_COMMUNITY)
Admission: EM | Disposition: A | Discharge status: Skilled Nursing Facility | Payer: Self-pay | Source: Home / Self Care | Attending: Internal Medicine

## 2022-01-29 DIAGNOSIS — N390 Urinary tract infection, site not specified: Secondary | ICD-10-CM | POA: Insufficient documentation

## 2022-01-29 DIAGNOSIS — K219 Gastro-esophageal reflux disease without esophagitis: Secondary | ICD-10-CM | POA: Diagnosis not present

## 2022-01-29 DIAGNOSIS — S72001A Fracture of unspecified part of neck of right femur, initial encounter for closed fracture: Secondary | ICD-10-CM | POA: Diagnosis not present

## 2022-01-29 DIAGNOSIS — N3000 Acute cystitis without hematuria: Secondary | ICD-10-CM

## 2022-01-29 DIAGNOSIS — N1831 Chronic kidney disease, stage 3a: Secondary | ICD-10-CM | POA: Diagnosis not present

## 2022-01-29 LAB — URINALYSIS, ROUTINE W REFLEX MICROSCOPIC
Bilirubin Urine: NEGATIVE
Glucose, UA: NEGATIVE mg/dL
Hgb urine dipstick: NEGATIVE
Ketones, ur: NEGATIVE mg/dL
Nitrite: NEGATIVE
Protein, ur: 100 mg/dL — AB
Specific Gravity, Urine: 1.019 (ref 1.005–1.030)
WBC, UA: >50 WBC/hpf — ABNORMAL HIGH (ref 0–5)
pH: 7 (ref 5.0–8.0)

## 2022-01-29 LAB — CBC
HCT: 40.6 % (ref 36.0–46.0)
Hemoglobin: 13.3 g/dL (ref 12.0–15.0)
MCH: 31.7 pg (ref 26.0–34.0)
MCHC: 32.8 g/dL (ref 30.0–36.0)
MCV: 96.9 fL (ref 80.0–100.0)
Platelets: 177 10*3/uL (ref 150–400)
RBC: 4.19 MIL/uL (ref 3.87–5.11)
RDW: 13.5 % (ref 11.5–15.5)
WBC: 10.5 10*3/uL (ref 4.0–10.5)
nRBC: 0 % (ref 0.0–0.2)

## 2022-01-29 LAB — BASIC METABOLIC PANEL
Anion gap: 8 (ref 5–15)
BUN: 25 mg/dL — ABNORMAL HIGH (ref 8–23)
CO2: 26 mmol/L (ref 22–32)
Calcium: 9.3 mg/dL (ref 8.9–10.3)
Chloride: 96 mmol/L — ABNORMAL LOW (ref 98–111)
Creatinine, Ser: 1.5 mg/dL — ABNORMAL HIGH (ref 0.44–1.00)
GFR, Estimated: 34 mL/min — ABNORMAL LOW (ref >60)
Glucose, Bld: 188 mg/dL — ABNORMAL HIGH (ref 70–99)
Potassium: 4.3 mmol/L (ref 3.5–5.1)
Sodium: 130 mmol/L — ABNORMAL LOW (ref 135–145)

## 2022-01-29 LAB — GLUCOSE, CAPILLARY
Glucose-Capillary: 205 mg/dL — ABNORMAL HIGH (ref 70–99)
Glucose-Capillary: 231 mg/dL — ABNORMAL HIGH (ref 70–99)
Glucose-Capillary: 182 mg/dL — ABNORMAL HIGH (ref 70–99)
Glucose-Capillary: 132 mg/dL — ABNORMAL HIGH (ref 70–99)

## 2022-01-29 LAB — MRSA NEXT GEN BY PCR, NASAL: MRSA by PCR Next Gen: NOT DETECTED

## 2022-01-29 SURGERY — FIXATION, FRACTURE, INTERTROCHANTERIC, WITH INTRAMEDULLARY ROD
Anesthesia: Choice | Laterality: Right

## 2022-01-29 MED ORDER — SENNOSIDES-DOCUSATE SODIUM 8.6-50 MG PO TABS
2.0000 | ORAL_TABLET | Freq: Two times a day (BID) | ORAL | Status: DC
  Administered 2022-01-29 – 2022-02-05 (×11): 2 via ORAL
  Filled 2022-01-29 (×12): qty 2

## 2022-01-29 MED ORDER — SODIUM CHLORIDE 0.9 % IV SOLN: INTRAVENOUS | Status: DC

## 2022-01-29 MED ORDER — ALBUMIN HUMAN 25 % IV SOLN
25.0000 g | Freq: Once | INTRAVENOUS | Status: AC
  Administered 2022-01-29: 25 g via INTRAVENOUS
  Filled 2022-01-29: qty 100

## 2022-01-29 MED ORDER — SODIUM CHLORIDE 0.9 % IV SOLN
2.0000 g | INTRAVENOUS | Status: DC
  Administered 2022-01-29 – 2022-01-30 (×2): 2 g via INTRAVENOUS
  Filled 2022-01-29 (×2): qty 20

## 2022-01-29 MED ORDER — POLYVINYL ALCOHOL 1.4 % OP SOLN
1.0000 [drp] | OPHTHALMIC | Status: DC | PRN
  Administered 2022-02-05: 1 [drp] via OPHTHALMIC
  Filled 2022-01-29: qty 15

## 2022-01-29 MED ORDER — MORPHINE SULFATE (PF) 2 MG/ML IV SOLN
1.0000 mg | INTRAVENOUS | Status: DC | PRN
  Administered 2022-01-30: 1 mg via INTRAVENOUS
  Filled 2022-01-29: qty 1

## 2022-01-29 NOTE — Progress Notes (Addendum)
PROGRESS NOTE    Miranda Wilkinson  HUD:[id number] DOB: 23-May-1935 DOA: 01/26/2022 PCP: Caryl Bis, MD    Chief Complaint  Patient presents with   Fall    Brief Narrative:  Patient is 86 year old female history of hypertension, hyperlipidemia, type 2 diabetes, A-fib on Eliquis, hypothyroidism, GERD, CKD stage IIIa, hard of hearing who presented after mechanical fall with right hip fracture.  Patient noted to have been on Eliquis prior to admission as such surgical/operative correction held until Eliquis washout done.  Patient subsequently transferred to [facility] for operative correction per Dr. Alvan Dame.   Assessment & Plan:   Principal Problem:   Closed displaced fracture of right femoral neck (HCC) Active Problems:   Acquired hypothyroidism   Essential hypertension, benign   Mixed hyperlipidemia   Type 2 diabetes mellitus with hyperglycemia (HCC)   Hyponatremia   Leukocytosis   Atrial fibrillation, chronic (HCC)   GERD (gastroesophageal reflux disease)   OSA on CPAP   Obesity (BMI 30-39.9)   Chronic kidney disease, stage 3a ([place])   UTI (urinary tract infection)  #1 closed displaced fracture right femoral neck -Secondary to mechanical fall. -Patient noted last dose of Eliquis was the p.m. dose of 01/26/2022. -Patient seen by orthopedics and unable to do surgical correction and to Eliquis washed out. -Patient transferred to [facility] for surgical correction per orthopedics, Dr. Alvan Dame which per RN will be performed tomorrow. -Decrease morphine to 1 mg every 4 hours as needed pain as patient noted to have soft blood pressure and increased O2 requirements after morphine dose given early on today. -Discontinue Lovenox preoperatively on postop DVT prophylaxis per orthopedics. -Pain management per orthopedics. -Per orthopedics.  2.  Leukocytosis -Patient noted to have a leukocytosis, low grade temp of 100.4. -Chest x-ray done with no acute infiltrate noted. -Urinalysis  concerning for UTI. -Check urine cultures. -IV Rocephin.  3.  Probable UTI -Check urine cultures. -IV Rocephin for.  4.  Diabetes mellitus type 2 -Hemoglobin A1c 7.6 (01/26/2022) -CBG 205 this morning. -Hold home regimen oral hypoglycemic agents. -SSI.  5.  Hypothyroidism -Continue home regimen Synthroid.  6.  Hypertension -BP noted to be soft this morning.  Concern morphine may be playing a role with decreased blood pressure. -Decrease morphine to 1 mg every 4 hours as needed. -Hold patient's lisinopril. -Continue Zebeta. -IV albumin x 1.  6.  GERD -PPI.  7.  Hyperlipidemia -Continue statin.  8.  CKD stage IIIa -Slight bump in creatinine. -Discontinue lisinopril. -Follow.  9.  Class II obesity/OSA -Labs to modification. -CPAP nightly -Lifestyle modification.    DVT prophylaxis: SCDs preop, postop per orthopedics. Code Status: Full Family Communication: Updated patient, daughter at bedside. Disposition: TBD  Status is: Inpatient Remains inpatient appropriate because: Severity of illness   Consultants:  Orthopedics: Dr.Cairns 01/28/2022  Procedures:  CT head 01/26/2022 Chest x-ray 01/29/2022   Antimicrobials:  IV Rocephin 01/29/2022>>>>   Subjective: Laying in bed.  Patient noted to have received some morphine and noted to have increased O2 requirements per RN.  Patient with complaints of right hip pain.  Daughter at bedside.  Objective: Vitals:   01/29/22 0000 01/29/22 0434 01/29/22 0746 01/29/22 1301  BP: 105/66 (!) 118/57 108/65 (!) 99/50  Pulse: 96 (!) 109 100 92  Resp: '18 18 20 18  '$ Temp: (!) 100.4 F (38 C) 99.4 F (37.4 C) 98.9 F (37.2 C) 98.1 F (36.7 C)  TempSrc: Oral Oral Oral Oral  SpO2: 92% 90% 92% 96%  Weight:  Height:        Intake/Output Summary (Last 24 hours) at 01/29/2022 1337 Last data filed at 01/29/2022 0430 Gross per 24 hour  Intake --  Output 0 ml  Net 0 ml   Filed Weights   01/26/22 2102  Weight: 106  kg    Examination:  General exam: Appears calm and comfortable. HOH Respiratory system: Crackles noted.  No wheezing.  Fair air movement.  On CPAP.   Cardiovascular system: S1 & S2 heard, RRR. No JVD, murmurs, rubs, gallops or clicks. No pedal edema. Gastrointestinal system: Abdomen is nondistended, soft and nontender. No organomegaly or masses felt. Normal bowel sounds heard. Central nervous system: Alert and oriented. No focal neurological deficits. Extremities: Right hip tender to palpation and externally rotated.   Skin: No rashes, lesions or ulcers Psychiatry: Judgement and insight appear normal. Mood & affect appropriate.     Data Reviewed: I have personally reviewed following labs and imaging studies  CBC: Recent Labs  Lab 01/26/22 2135 01/27/22 0849 01/29/22 0509  WBC 13.8* 11.0* 10.5  HGB 15.4* 14.5 13.3  HCT 45.3 42.4 40.6  MCV 94.8 94.9 96.9  PLT 245 218 734    Basic Metabolic Panel: Recent Labs  Lab 01/26/22 2135 01/27/22 0849 01/29/22 0509  NA 132* 135 130*  K 3.7 3.9 4.3  CL 95* 98 96*  CO2 '25 28 26  '$ GLUCOSE 209* 182* 188*  BUN 15 14 25*  CREATININE 1.18* 1.01* 1.50*  CALCIUM 9.8 9.7 9.3  MG  --  1.7  --   PHOS  --  3.0  --     GFR: Estimated Creatinine Clearance: 33.8 mL/min (A) (by C-G formula based on SCr of 1.5 mg/dL (H)).  Liver Function Tests: Recent Labs  Lab 01/27/22 0849  AST 30  ALT 28  ALKPHOS 53  BILITOT 0.8  PROT 7.0  ALBUMIN 3.5    CBG: Recent Labs  Lab 01/28/22 1207 01/28/22 1700 01/28/22 2106 01/29/22 0731 01/29/22 1128  GLUCAP 190* 167* 208* 205* 231*     Recent Results (from the past 240 hour(s))  MRSA Next Gen by PCR, Nasal     Status: None   Collection Time: 01/29/22  5:17 AM   Specimen: Nasal Mucosa; Nasal Swab  Result Value Ref Range Status   MRSA by PCR Next Gen NOT DETECTED NOT DETECTED Final    Comment: (NOTE) The GeneXpert MRSA Assay (FDA approved for NASAL specimens only), is one component of  a comprehensive MRSA colonization surveillance program. It is not intended to diagnose MRSA infection nor to guide or monitor treatment for MRSA infections. Test performance is not FDA approved in patients less than 80 years old. Performed at [facility], [place] [address], [place], [place] [address]          Radiology Studies: DG CHEST PORT 1 VIEW  Result Date: 01/29/2022 CLINICAL DATA:  Fever EXAM: PORTABLE CHEST 1 VIEW COMPARISON:  Radiograph 12/08/2019 FINDINGS: Unchanged large cardiac silhouette. There are mild interstitial opacities. No airspace consolidation. No pleural effusion or pneumothorax. No acute osseous abnormality. Thoracic spondylosis. IMPRESSION: Mild diffuse interstitial opacities, suggesting interstitial edema. Unchanged cardiomegaly. No focal airspace consolidation. Electronically Signed   By: Maurine Simmering M.D.   On: 01/29/2022 10:55        Scheduled Meds:  bisoprolol  20 mg Oral Daily   enoxaparin (LOVENOX) injection  40 mg Subcutaneous Q24H   insulin aspart  0-9 Units Subcutaneous TID WC   levothyroxine  112  mcg Oral Q0600   methocarbamol  500 mg Oral TID   pantoprazole  40 mg Oral Daily   polyethylene glycol  17 g Oral BID   senna-docusate  2 tablet Oral QHS   simvastatin  20 mg Oral Q supper   traZODone  100 mg Oral QHS   Continuous Infusions:  cefTRIAXone (ROCEPHIN)  IV 2 g (01/29/22 1054)     LOS: 3 days    Time spent: 40 minutes.    Irine Seal, MD [facility]   To contact the attending provider between 7A-7P or the covering provider during after hours 7P-7A, please log into the web site [email or web address] and access using universal [place] password for that web site. If you do not have the password, please call the hospital operator.  01/29/2022, 1:37 PM

## 2022-01-29 NOTE — Plan of Care (Signed)
  Problem: Elimination: Goal: Will not experience complications related to urinary retention Outcome: Progressing   Problem: Pain Managment: Goal: General experience of comfort will improve Outcome: Progressing   Problem: Safety: Goal: Ability to remain free from injury will improve Outcome: Progressing   

## 2022-01-29 NOTE — Care Management Important Message (Signed)
Important Message  Patient Details IM Letter given Name: Miranda Wilkinson MRN: [id number] Date of Birth: 16-Mar-1935   Medicare Important Message Given:  Yes     Kerin Salen 01/29/2022, 9:43 AM

## 2022-01-29 NOTE — Progress Notes (Signed)
PT has personal CPAP equip. RT used red outlet for CPAP. Provided water for humidification. Check the power cord for frays.

## 2022-01-30 ENCOUNTER — Inpatient Hospital Stay (HOSPITAL_COMMUNITY): Payer: Medicare Other | Admitting: Anesthesiology

## 2022-01-30 ENCOUNTER — Encounter (HOSPITAL_COMMUNITY)
Admission: EM | Disposition: A | Discharge status: Skilled Nursing Facility | Payer: Self-pay | Source: Home / Self Care | Attending: Internal Medicine

## 2022-01-30 ENCOUNTER — Inpatient Hospital Stay (HOSPITAL_COMMUNITY): Payer: Medicare Other

## 2022-01-30 DIAGNOSIS — N3000 Acute cystitis without hematuria: Secondary | ICD-10-CM | POA: Diagnosis not present

## 2022-01-30 DIAGNOSIS — I10 Essential (primary) hypertension: Secondary | ICD-10-CM | POA: Diagnosis not present

## 2022-01-30 DIAGNOSIS — S72001A Fracture of unspecified part of neck of right femur, initial encounter for closed fracture: Secondary | ICD-10-CM | POA: Diagnosis not present

## 2022-01-30 DIAGNOSIS — J449 Chronic obstructive pulmonary disease, unspecified: Secondary | ICD-10-CM | POA: Diagnosis not present

## 2022-01-30 DIAGNOSIS — Z87891 Personal history of nicotine dependence: Secondary | ICD-10-CM

## 2022-01-30 DIAGNOSIS — Z96641 Presence of right artificial hip joint: Secondary | ICD-10-CM

## 2022-01-30 DIAGNOSIS — K219 Gastro-esophageal reflux disease without esophagitis: Secondary | ICD-10-CM | POA: Diagnosis not present

## 2022-01-30 DIAGNOSIS — N1831 Chronic kidney disease, stage 3a: Secondary | ICD-10-CM | POA: Diagnosis not present

## 2022-01-30 HISTORY — PX: TOTAL HIP ARTHROPLASTY: SHX124

## 2022-01-30 LAB — RENAL FUNCTION PANEL
Albumin: 3.5 g/dL (ref 3.5–5.0)
Anion gap: 9 (ref 5–15)
BUN: 38 mg/dL — ABNORMAL HIGH (ref 8–23)
CO2: 25 mmol/L (ref 22–32)
Calcium: 9.1 mg/dL (ref 8.9–10.3)
Chloride: 95 mmol/L — ABNORMAL LOW (ref 98–111)
Creatinine, Ser: 1.76 mg/dL — ABNORMAL HIGH (ref 0.44–1.00)
GFR, Estimated: 28 mL/min — ABNORMAL LOW (ref >60)
Glucose, Bld: 164 mg/dL — ABNORMAL HIGH (ref 70–99)
Phosphorus: 3.2 mg/dL (ref 2.5–4.6)
Potassium: 4.2 mmol/L (ref 3.5–5.1)
Sodium: 129 mmol/L — ABNORMAL LOW (ref 135–145)

## 2022-01-30 LAB — CBC WITH DIFFERENTIAL/PLATELET
Abs Immature Granulocytes: 0.25 10*3/uL — ABNORMAL HIGH (ref 0.00–0.07)
Basophils Absolute: 0.1 10*3/uL (ref 0.0–0.1)
Basophils Relative: 1 %
Eosinophils Absolute: 0.5 10*3/uL (ref 0.0–0.5)
Eosinophils Relative: 4 %
HCT: 39.7 % (ref 36.0–46.0)
Hemoglobin: 12.8 g/dL (ref 12.0–15.0)
Immature Granulocytes: 2 %
Lymphocytes Relative: 8 %
Lymphs Abs: 0.9 10*3/uL (ref 0.7–4.0)
MCH: 31.4 pg (ref 26.0–34.0)
MCHC: 32.2 g/dL (ref 30.0–36.0)
MCV: 97.3 fL (ref 80.0–100.0)
Monocytes Absolute: 0.9 10*3/uL (ref 0.1–1.0)
Monocytes Relative: 7 %
Neutro Abs: 9.5 10*3/uL — ABNORMAL HIGH (ref 1.7–7.7)
Neutrophils Relative %: 78 %
Platelets: 174 10*3/uL (ref 150–400)
RBC: 4.08 MIL/uL (ref 3.87–5.11)
RDW: 13.6 % (ref 11.5–15.5)
WBC: 12.1 10*3/uL — ABNORMAL HIGH (ref 4.0–10.5)
nRBC: 0 % (ref 0.0–0.2)

## 2022-01-30 LAB — GLUCOSE, CAPILLARY
Glucose-Capillary: 173 mg/dL — ABNORMAL HIGH (ref 70–99)
Glucose-Capillary: 205 mg/dL — ABNORMAL HIGH (ref 70–99)
Glucose-Capillary: 380 mg/dL — ABNORMAL HIGH (ref 70–99)
Glucose-Capillary: 282 mg/dL — ABNORMAL HIGH (ref 70–99)

## 2022-01-30 LAB — CREATININE, URINE, RANDOM: Creatinine, Urine: 142 mg/dL

## 2022-01-30 LAB — SODIUM, URINE, RANDOM: Sodium, Ur: 16 mmol/L

## 2022-01-30 SURGERY — ARTHROPLASTY, HIP, TOTAL, ANTERIOR APPROACH
Anesthesia: Spinal | Site: Hip | Laterality: Right

## 2022-01-30 MED ORDER — PHENYLEPHRINE HCL-NACL 20-0.9 MG/250ML-% IV SOLN
INTRAVENOUS | Status: DC | PRN
  Administered 2022-01-30: 35 ug/min via INTRAVENOUS
  Administered 2022-01-30: 15 ug/min via INTRAVENOUS

## 2022-01-30 MED ORDER — DEXAMETHASONE SODIUM PHOSPHATE 10 MG/ML IJ SOLN
INTRAMUSCULAR | Status: DC | PRN
  Administered 2022-01-30: 8 mg via INTRAVENOUS

## 2022-01-30 MED ORDER — METHOCARBAMOL 500 MG IVPB - SIMPLE MED: 500.0000 mg | Freq: Four times a day (QID) | INTRAVENOUS | Status: DC | PRN

## 2022-01-30 MED ORDER — KETOROLAC TROMETHAMINE 30 MG/ML IJ SOLN
INTRAMUSCULAR | Status: DC | PRN
  Administered 2022-01-30: 30 mg via INTRAVENOUS

## 2022-01-30 MED ORDER — STERILE WATER FOR IRRIGATION IR SOLN
Status: DC | PRN
  Administered 2022-01-30: 1000 mL

## 2022-01-30 MED ORDER — MENTHOL 3 MG MT LOZG: 1.0000 | LOZENGE | OROMUCOSAL | Status: DC | PRN

## 2022-01-30 MED ORDER — FENTANYL CITRATE PF 50 MCG/ML IJ SOSY: 25.0000 ug | PREFILLED_SYRINGE | INTRAMUSCULAR | Status: DC | PRN

## 2022-01-30 MED ORDER — PROPOFOL 500 MG/50ML IV EMUL
INTRAVENOUS | Status: DC | PRN
  Administered 2022-01-30: 30 mg via INTRAVENOUS
  Administered 2022-01-30: 40 ug/kg/min via INTRAVENOUS

## 2022-01-30 MED ORDER — ACETAMINOPHEN 325 MG PO TABS
325.0000 mg | ORAL_TABLET | Freq: Four times a day (QID) | ORAL | Status: DC | PRN
  Administered 2022-02-03 – 2022-02-04 (×2): 650 mg via ORAL
  Filled 2022-01-30 (×2): qty 2

## 2022-01-30 MED ORDER — DIPHENHYDRAMINE HCL 12.5 MG/5ML PO ELIX: 12.5000 mg | ORAL_SOLUTION | ORAL | Status: DC | PRN

## 2022-01-30 MED ORDER — METOCLOPRAMIDE HCL 5 MG/ML IJ SOLN: 5.0000 mg | Freq: Three times a day (TID) | INTRAMUSCULAR | Status: DC | PRN

## 2022-01-30 MED ORDER — TRANEXAMIC ACID-NACL 1000-0.7 MG/100ML-% IV SOLN
1000.0000 mg | INTRAVENOUS | Status: AC
  Administered 2022-01-30: 1000 mg via INTRAVENOUS
  Filled 2022-01-30: qty 100

## 2022-01-30 MED ORDER — BUPIVACAINE HCL (PF) 0.25 % IJ SOLN
INTRAMUSCULAR | Status: AC
  Filled 2022-01-30: qty 30

## 2022-01-30 MED ORDER — 0.9 % SODIUM CHLORIDE (POUR BTL) OPTIME
TOPICAL | Status: DC | PRN
  Administered 2022-01-30: 1000 mL

## 2022-01-30 MED ORDER — FENTANYL CITRATE (PF) 100 MCG/2ML IJ SOLN
INTRAMUSCULAR | Status: AC
  Filled 2022-01-30: qty 2

## 2022-01-30 MED ORDER — METOCLOPRAMIDE HCL 5 MG PO TABS: 5.0000 mg | ORAL_TABLET | Freq: Three times a day (TID) | ORAL | Status: DC | PRN

## 2022-01-30 MED ORDER — BUPIVACAINE HCL (PF) 0.25 % IJ SOLN
INTRAMUSCULAR | Status: DC | PRN
  Administered 2022-01-30: 30 mL

## 2022-01-30 MED ORDER — CEFAZOLIN SODIUM-DEXTROSE 2-4 GM/100ML-% IV SOLN
2.0000 g | Freq: Four times a day (QID) | INTRAVENOUS | Status: AC
  Administered 2022-01-30 (×2): 2 g via INTRAVENOUS
  Filled 2022-01-30 (×3): qty 100

## 2022-01-30 MED ORDER — MORPHINE SULFATE (PF) 2 MG/ML IV SOLN: 0.5000 mg | INTRAVENOUS | Status: DC | PRN

## 2022-01-30 MED ORDER — HYDROCODONE-ACETAMINOPHEN 5-325 MG PO TABS
1.0000 | ORAL_TABLET | ORAL | Status: DC | PRN
  Administered 2022-01-31 – 2022-02-05 (×3): 1 via ORAL
  Filled 2022-01-30 (×4): qty 1

## 2022-01-30 MED ORDER — PROPOFOL 1000 MG/100ML IV EMUL
INTRAVENOUS | Status: AC
  Filled 2022-01-30: qty 100

## 2022-01-30 MED ORDER — KETOROLAC TROMETHAMINE 30 MG/ML IJ SOLN
INTRAMUSCULAR | Status: AC
  Filled 2022-01-30: qty 1

## 2022-01-30 MED ORDER — ONDANSETRON HCL 4 MG/2ML IJ SOLN: 4.0000 mg | Freq: Four times a day (QID) | INTRAMUSCULAR | Status: DC | PRN

## 2022-01-30 MED ORDER — ONDANSETRON HCL 4 MG PO TABS: 4.0000 mg | ORAL_TABLET | Freq: Four times a day (QID) | ORAL | Status: DC | PRN

## 2022-01-30 MED ORDER — ONDANSETRON HCL 4 MG/2ML IJ SOLN
INTRAMUSCULAR | Status: AC
  Filled 2022-01-30: qty 2

## 2022-01-30 MED ORDER — CEFAZOLIN SODIUM-DEXTROSE 2-4 GM/100ML-% IV SOLN
2.0000 g | INTRAVENOUS | Status: AC
  Administered 2022-01-30: 2 g via INTRAVENOUS
  Filled 2022-01-30: qty 100

## 2022-01-30 MED ORDER — POLYETHYLENE GLYCOL 3350 17 G PO PACK
17.0000 g | PACK | Freq: Two times a day (BID) | ORAL | Status: DC
  Administered 2022-01-30 – 2022-02-05 (×7): 17 g via ORAL
  Filled 2022-01-30 (×10): qty 1

## 2022-01-30 MED ORDER — METOPROLOL TARTRATE 5 MG/5ML IV SOLN
INTRAVENOUS | Status: AC
  Filled 2022-01-30: qty 5

## 2022-01-30 MED ORDER — HYDROCODONE-ACETAMINOPHEN 7.5-325 MG PO TABS
1.0000 | ORAL_TABLET | ORAL | Status: DC | PRN
  Administered 2022-02-01: 2 via ORAL
  Administered 2022-02-01: 1 via ORAL
  Administered 2022-02-02 – 2022-02-03 (×4): 2 via ORAL
  Administered 2022-02-04 – 2022-02-05 (×2): 1 via ORAL
  Filled 2022-01-30: qty 1
  Filled 2022-01-30 (×4): qty 2
  Filled 2022-01-30 (×2): qty 1
  Filled 2022-01-30: qty 2
  Filled 2022-01-30: qty 1
  Filled 2022-01-30 (×2): qty 2

## 2022-01-30 MED ORDER — TRANEXAMIC ACID-NACL 1000-0.7 MG/100ML-% IV SOLN
1000.0000 mg | Freq: Once | INTRAVENOUS | Status: AC
  Administered 2022-01-30: 1000 mg via INTRAVENOUS
  Filled 2022-01-30: qty 100

## 2022-01-30 MED ORDER — SODIUM CHLORIDE 0.9 % IV SOLN: INTRAVENOUS | Status: DC | PRN

## 2022-01-30 MED ORDER — FENTANYL CITRATE (PF) 100 MCG/2ML IJ SOLN
INTRAMUSCULAR | Status: DC | PRN
  Administered 2022-01-30: 25 ug via INTRAVENOUS

## 2022-01-30 MED ORDER — SODIUM CHLORIDE 0.9 % IV SOLN: INTRAVENOUS | Status: DC

## 2022-01-30 MED ORDER — LACTATED RINGERS IV SOLN: INTRAVENOUS | Status: DC | PRN

## 2022-01-30 MED ORDER — ONDANSETRON HCL 4 MG/2ML IJ SOLN
INTRAMUSCULAR | Status: DC | PRN
  Administered 2022-01-30: 4 mg via INTRAVENOUS

## 2022-01-30 MED ORDER — DEXAMETHASONE SODIUM PHOSPHATE 10 MG/ML IJ SOLN
10.0000 mg | Freq: Once | INTRAMUSCULAR | Status: AC
  Administered 2022-01-31: 10 mg via INTRAVENOUS
  Filled 2022-01-30 (×2): qty 1

## 2022-01-30 MED ORDER — BISACODYL 10 MG RE SUPP: 10.0000 mg | Freq: Every day | RECTAL | Status: DC | PRN

## 2022-01-30 MED ORDER — SODIUM CHLORIDE (PF) 0.9 % IJ SOLN
INTRAMUSCULAR | Status: DC | PRN
  Administered 2022-01-30: 30 mL

## 2022-01-30 MED ORDER — APIXABAN 5 MG PO TABS
5.0000 mg | ORAL_TABLET | Freq: Two times a day (BID) | ORAL | Status: DC
  Administered 2022-01-31 – 2022-02-05 (×11): 5 mg via ORAL
  Filled 2022-01-30 (×12): qty 1

## 2022-01-30 MED ORDER — METHOCARBAMOL 500 MG PO TABS
500.0000 mg | ORAL_TABLET | Freq: Four times a day (QID) | ORAL | Status: DC | PRN
  Administered 2022-02-04: 500 mg via ORAL
  Filled 2022-01-30: qty 1

## 2022-01-30 MED ORDER — BUPIVACAINE IN DEXTROSE 0.75-8.25 % IT SOLN
INTRATHECAL | Status: DC | PRN
  Administered 2022-01-30: 1.8 mL via INTRATHECAL

## 2022-01-30 MED ORDER — DEXAMETHASONE SODIUM PHOSPHATE 10 MG/ML IJ SOLN
INTRAMUSCULAR | Status: AC
  Filled 2022-01-30: qty 1

## 2022-01-30 MED ORDER — PHENYLEPHRINE 80 MCG/ML (10ML) SYRINGE FOR IV PUSH (FOR BLOOD PRESSURE SUPPORT)
PREFILLED_SYRINGE | INTRAVENOUS | Status: DC | PRN
  Administered 2022-01-30: 160 ug via INTRAVENOUS

## 2022-01-30 MED ORDER — PHENOL 1.4 % MT LIQD: 1.0000 | OROMUCOSAL | Status: DC | PRN

## 2022-01-30 MED ORDER — DOCUSATE SODIUM 100 MG PO CAPS
100.0000 mg | ORAL_CAPSULE | Freq: Two times a day (BID) | ORAL | Status: DC
  Administered 2022-01-30 – 2022-02-05 (×9): 100 mg via ORAL
  Filled 2022-01-30 (×10): qty 1

## 2022-01-30 MED ORDER — POVIDONE-IODINE 10 % EX SWAB
2.0000 | Freq: Once | CUTANEOUS | Status: AC
  Administered 2022-01-30: 2 via TOPICAL

## 2022-01-30 MED ORDER — CHLORHEXIDINE GLUCONATE 4 % EX LIQD
60.0000 mL | Freq: Once | CUTANEOUS | Status: AC
  Administered 2022-01-30: 4 via TOPICAL
  Filled 2022-01-30: qty 60

## 2022-01-30 SURGICAL SUPPLY — 38 items
ARTICULEZE HEAD (Hips) ×1 IMPLANT
BAG COUNTER SPONGE SURGICOUNT (BAG) IMPLANT
BAG DECANTER FOR FLEXI CONT (MISCELLANEOUS) IMPLANT
BAG ZIPLOCK 12X15 (MISCELLANEOUS) IMPLANT
BLADE SAG 18X100X1.27 (BLADE) ×1 IMPLANT
COVER PERINEAL POST (MISCELLANEOUS) ×1 IMPLANT
COVER SURGICAL LIGHT HANDLE (MISCELLANEOUS) ×1 IMPLANT
CUP ACETBLR 52 OD PINNACLE (Hips) IMPLANT
DERMABOND ADVANCED .7 DNX12 (GAUZE/BANDAGES/DRESSINGS) ×1 IMPLANT
DRAPE FOOT SWITCH (DRAPES) ×1 IMPLANT
DRAPE STERI IOBAN 125X83 (DRAPES) ×1 IMPLANT
DRAPE U-SHAPE 47X51 STRL (DRAPES) ×2 IMPLANT
DRESSING AQUACEL AG SP 3.5X10 (GAUZE/BANDAGES/DRESSINGS) ×1 IMPLANT
DRSG AQUACEL AG SP 3.5X10 (GAUZE/BANDAGES/DRESSINGS) ×1
DURAPREP 26ML APPLICATOR (WOUND CARE) ×1 IMPLANT
ELECT REM PT RETURN 15FT ADLT (MISCELLANEOUS) ×1 IMPLANT
GLOVE BIO SURGEON STRL SZ 6 (GLOVE) ×1 IMPLANT
GLOVE BIOGEL PI IND STRL 6.5 (GLOVE) ×1 IMPLANT
GLOVE BIOGEL PI IND STRL 7.5 (GLOVE) ×1 IMPLANT
GLOVE ORTHO TXT STRL SZ7.5 (GLOVE) ×2 IMPLANT
GOWN STRL REUS W/ TWL LRG LVL3 (GOWN DISPOSABLE) ×2 IMPLANT
GOWN STRL REUS W/TWL LRG LVL3 (GOWN DISPOSABLE) ×2
HEAD ARTICULEZE (Hips) IMPLANT
HOLDER FOLEY CATH W/STRAP (MISCELLANEOUS) ×1 IMPLANT
KIT TURNOVER KIT A (KITS) IMPLANT
LINER NEUTRAL 52X36MM PLUS 4 (Liner) IMPLANT
PACK ANTERIOR HIP CUSTOM (KITS) ×1 IMPLANT
SCREW 6.5MMX30MM (Screw) IMPLANT
STEM FEMORAL SZ5 HIGH ACTIS (Stem) IMPLANT
SUT MNCRL AB 4-0 PS2 18 (SUTURE) ×1 IMPLANT
SUT STRATAFIX 0 PDS 27 VIOLET (SUTURE) ×1
SUT VIC AB 1 CT1 36 (SUTURE) ×3 IMPLANT
SUT VIC AB 2-0 CT1 27 (SUTURE) ×2
SUT VIC AB 2-0 CT1 TAPERPNT 27 (SUTURE) ×2 IMPLANT
SUTURE STRATFX 0 PDS 27 VIOLET (SUTURE) ×1 IMPLANT
TRAY FOLEY MTR SLVR 16FR STAT (SET/KITS/TRAYS/PACK) IMPLANT
TUBE SUCTION HIGH CAP CLEAR NV (SUCTIONS) ×1 IMPLANT
WATER STERILE IRR 1000ML POUR (IV SOLUTION) ×1 IMPLANT

## 2022-01-30 NOTE — Anesthesia Procedure Notes (Signed)
Spinal  Patient location during procedure: OR Start time: 01/30/2022 7:53 AM End time: 01/30/2022 7:56 AM Reason for block: surgical anesthesia Staffing Performed: anesthesiologist  Anesthesiologist: Freddrick March, MD Performed by: Freddrick March, MD Authorized by: Freddrick March, MD   Preanesthetic Checklist Completed: patient identified, IV checked, risks and benefits discussed, surgical consent, monitors and equipment checked, pre-op evaluation and timeout performed Spinal Block Patient position: sitting Prep: DuraPrep and site prepped and draped Patient monitoring: cardiac monitor, continuous pulse ox and blood pressure Approach: midline Location: L3-4 Injection technique: single-shot Needle Needle type: Pencan  Needle gauge: 24 G Needle length: 9 cm Assessment Sensory level: T6 Events: CSF return Additional Notes Functioning IV was confirmed and monitors were applied. Sterile prep and drape, including hand hygiene and sterile gloves were used. The patient was positioned and the spine was prepped. The skin was anesthetized with lidocaine.  Free flow of clear CSF was obtained prior to injecting local anesthetic into the CSF.  The spinal needle aspirated freely following injection.  The needle was carefully withdrawn.  The patient tolerated the procedure well.

## 2022-01-30 NOTE — Transfer of Care (Signed)
Immediate Anesthesia Transfer of Care Note  Patient: Miranda Wilkinson  Procedure(s) Performed: TOTAL HIP ARTHROPLASTY ANTERIOR APPROACH (Right: Hip)  Patient Location: PACU  Anesthesia Type:MAC and Spinal  Level of Consciousness: awake and patient cooperative  Airway & Oxygen Therapy: Patient Spontanous Breathing and Patient connected to face mask oxygen  Post-op Assessment: Report given to RN and Post -op Vital signs reviewed and stable  Post vital signs: Reviewed and stable  Last Vitals:  Vitals Value Taken Time  BP    Temp    Pulse    Resp    SpO2      Last Pain:  Vitals:   01/29/22 2251  TempSrc:   PainSc: Asleep      Patients Stated Pain Goal: 1 (01/00/71 2197)  Complications: No notable events documented.

## 2022-01-30 NOTE — Progress Notes (Signed)
Patient ID: Miranda Wilkinson, female   DOB: [birth year], 86 y.o.   MRN: [id number] Subjective: Day of Surgery Procedure(s) (LRB): TOTAL HIP ARTHROPLASTY ANTERIOR APPROACH (Right)    Patient reports pain as moderate particularly with movement  Objective:   VITALS:   Vitals:   01/30/22 0357 01/30/22 0720  BP: 138/82 (!) 136/50  Pulse: 97 (!) 102  Resp: 18 18  Temp: 98.7 F (37.1 C) 98.9 F (37.2 C)  SpO2: 96% 95%    Neurovascular intact RLE edema, 2+  LABS Recent Labs    01/27/22 0849 01/29/22 0509 01/30/22 0505  HGB 14.5 13.3 12.8  HCT 42.4 40.6 39.7  WBC 11.0* 10.5 12.1*  PLT 218 177 174    Recent Labs    01/27/22 0849 01/29/22 0509 01/30/22 0505  NA 135 130* 129*  K 3.9 4.3 4.2  BUN 14 25* 38*  CREATININE 1.01* 1.50* 1.76*  GLUCOSE 182* 188* 164*    No results for input(s): "LABPT", "INR" in the last 72 hours.   Assessment/Plan: Day of Surgery Procedure(s) (LRB): TOTAL HIP ARTHROPLASTY ANTERIOR APPROACH (Right)   To OR today for right THR for femoral neck fracture Consent signed

## 2022-01-30 NOTE — Anesthesia Postprocedure Evaluation (Signed)
Anesthesia Post Note  Patient: Miranda Wilkinson  Procedure(s) Performed: TOTAL HIP ARTHROPLASTY ANTERIOR APPROACH (Right: Hip)     Patient location during evaluation: PACU Anesthesia Type: Spinal Level of consciousness: oriented and awake and alert Pain management: pain level controlled Vital Signs Assessment: post-procedure vital signs reviewed and stable Respiratory status: spontaneous breathing, respiratory function stable and patient connected to nasal cannula oxygen Cardiovascular status: blood pressure returned to baseline and stable Postop Assessment: no headache, no backache and no apparent nausea or vomiting Anesthetic complications: no  No notable events documented.  Last Vitals:  Vitals:   01/30/22 1015 01/30/22 1030  BP: 117/75 (!) 141/75  Pulse: 88 90  Resp: 18 18  Temp:  (!) 36.4 C  SpO2: 92% 95%    Last Pain:  Vitals:   01/30/22 1030  TempSrc: Oral  PainSc:                  Ryhanna Dunsmore L Kennth [place]

## 2022-01-30 NOTE — Discharge Instructions (Signed)

## 2022-01-30 NOTE — Interval H&P Note (Signed)
History and Physical Interval Note:  01/30/2022 7:48 AM  Miranda Wilkinson  has presented today for surgery, with the diagnosis of Right hip fracture.  The various methods of treatment have been discussed with the patient and family. After consideration of risks, benefits and other options for treatment, the patient has consented to  Procedure(s): TOTAL HIP ARTHROPLASTY ANTERIOR APPROACH (Right) as a surgical intervention.  The patient's history has been reviewed, patient examined, no change in status, stable for surgery.  I have reviewed the patient's chart and labs.  Questions were answered to the patient's satisfaction.     [name]

## 2022-01-30 NOTE — H&P (View-Only) (Signed)
Patient ID: Miranda Wilkinson, female   DOB: 03-28-1935, 86 y.o.   MRN: [id number] Subjective: Day of Surgery Procedure(s) (LRB): TOTAL HIP ARTHROPLASTY ANTERIOR APPROACH (Right)    Patient reports pain as moderate particularly with movement  Objective:   VITALS:   Vitals:   01/30/22 0357 01/30/22 0720  BP: 138/82 (!) 136/50  Pulse: 97 (!) 102  Resp: 18 18  Temp: 98.7 F (37.1 C) 98.9 F (37.2 C)  SpO2: 96% 95%    Neurovascular intact RLE edema, 2+  LABS Recent Labs    01/27/22 0849 01/29/22 0509 01/30/22 0505  HGB 14.5 13.3 12.8  HCT 42.4 40.6 39.7  WBC 11.0* 10.5 12.1*  PLT 218 177 174    Recent Labs    01/27/22 0849 01/29/22 0509 01/30/22 0505  NA 135 130* 129*  K 3.9 4.3 4.2  BUN 14 25* 38*  CREATININE 1.01* 1.50* 1.76*  GLUCOSE 182* 188* 164*    No results for input(s): "LABPT", "INR" in the last 72 hours.   Assessment/Plan: Day of Surgery Procedure(s) (LRB): TOTAL HIP ARTHROPLASTY ANTERIOR APPROACH (Right)   To OR today for right THR for femoral neck fracture Consent signed

## 2022-01-30 NOTE — Op Note (Signed)
NAME:  Miranda Wilkinson                ACCOUNT NO.: [id number]      MEDICAL RECORD NO.: [id number]      FACILITY:  [facility]      PHYSICIAN:  [name]  DATE OF BIRTH:  01-Jun-1935     DATE OF PROCEDURE:  01/30/2022                                 OPERATIVE REPORT         PREOPERATIVE DIAGNOSIS: Right hip displaced femoral neck fracture.      POSTOPERATIVE DIAGNOSIS:  Right hip displaced femoral neck fracture     PROCEDURE:  Right total hip replacement through an anterior approach   utilizing DePuy THR system, component size 52 mm pinnacle cup, a size 36+4 neutral   Altrex liner, a size 5 Hi Actis stem with a 36+5 Articuleze metal head ball      SURGEON:  Pietro Cassis. Alvan Dame, M.D.      ASSISTANT:  Costella Hatcher, PA-C     ANESTHESIA:  Spinal.      SPECIMENS:  None.      COMPLICATIONS:  None.      BLOOD LOSS:  200 cc     DRAINS:  None.      INDICATION OF THE PROCEDURE:  Miranda Wilkinson is a 86 y.o. female who presented to the emergency room at [facility] after a ground-level fall at her home after slipping on an area rug.  After initial consideration of surgical treatment at that hospital she was subsequently transferred to [facility] for definitive management.  We discussed that based on the nature of her injury that she would be best treated with a total hip arthroplasty to treat her hip fracture but also minimize concerns for any future surgery.  Risks of infection DVT dislocation neurovascular injury and need for future surgeries were discussed and reviewed.  Consent was obtained for benefit of pain relief and management of her fracture.     PROCEDURE IN DETAIL:  The patient was brought to operative theater.   Once adequate anesthesia, preoperative antibiotics, 2 gm of Ancef, 1 gm of Tranexamic Acid, and 10 mg of Decadron were administered, the patient was positioned supine on the [employer] table.  Once the patient was safely positioned with adequate  padding of boney prominences we predraped out the hip, and used fluoroscopy to confirm orientation of the pelvis.      The right hip was then prepped and draped from proximal iliac crest to   mid thigh with a shower curtain technique.      Time-out was performed identifying the patient, planned procedure, and the appropriate extremity.     An incision was then made 2 cm lateral to the   anterior superior iliac spine extending over the orientation of the   tensor fascia lata muscle and sharp dissection was carried down to the   fascia of the muscle.      The fascia was then incised.  The muscle belly was identified and swept   laterally and retractor placed along the superior neck.  Following   cauterization of the circumflex vessels and removing some pericapsular   fat, a second cobra retractor was placed on the inferior neck.  A T-capsulotomy was made along the line of the   superior  neck to the trochanteric fossa, then extended proximally and   distally.  Tag sutures were placed and the retractors were then placed   intracapsular.  We then identified the trochanteric fossa and   orientation of my neck cut and then made a neck osteotomy with the femur on traction.  The fractured femoral neck segment was removed followed by removal of the femoral   head.  Traction was let   off and retractors were placed posterior and anterior around the   acetabulum.      The labrum and foveal tissue were debrided.  I began reaming with a 45 mm   reamer and reamed up to 51 mm reamer with good bony bed preparation and a 52 mm  cup was chosen.  The final 52 mm Pinnacle cup was then impacted under fluoroscopy to confirm the depth of penetration and orientation with respect to   Abduction and forward flexion.  A screw was placed into the ilium followed by the hole eliminator.  The final   36+4 neutral Altrex liner was impacted with good visualized rim fit.  The cup was positioned anatomically within the  acetabular portion of the pelvis.      At this point, the femur was rolled to 100 degrees.  Further capsule was   released off the inferior aspect of the femoral neck.  I then   released the superior capsule proximally.  With the leg in a neutral position the hook was placed laterally   along the femur under the vastus lateralis origin and elevated manually and then held in position using the hook attachment on the bed.  The leg was then extended and adducted with the leg rolled to 100   degrees of external rotation.  Retractors were placed along the medial calcar and posteriorly over the greater trochanter.  Once the proximal femur was fully   exposed, I used a box osteotome to set orientation.  I then began   broaching with the starting chili pepper broach and passed this by hand and then broached up to 5.  With the 5 broach in place I chose a high offset neck and did several trial reductions.  The offset was appropriate, leg lengths   appeared to be equal best matched with the +5 head ball trial confirmed radiographically.   Given these findings, I went ahead and dislocated the hip, repositioned all   retractors and positioned the right hip in the extended and abducted position.  The final 5 Hi Actis stem was   chosen and it was impacted down to the level of neck cut.  Based on this   and the trial reductions, a final 36+5 Articuleze metal head ball was chosen and   impacted onto a clean and dry trunnion, and the hip was reduced.  The   hip had been irrigated throughout the case again at this point.  I did   reapproximate the superior capsular leaflet to the anterior leaflet   using #1 Vicryl.  The fascia of the   tensor fascia lata muscle was then reapproximated using #1 Vicryl and #0 Stratafix sutures.  The   remaining wound was closed with 2-0 Vicryl and running 4-0 Monocryl.   The hip was cleaned, dried, and dressed sterilely using Dermabond and   Aquacel dressing.  The patient was then  brought   to recovery room in stable condition tolerating the procedure well.    Costella Hatcher, PA-C was present for the entirety of  the case involved from   preoperative positioning, perioperative retractor management, general   facilitation of the case, as well as primary wound closure as assistant.            Pietro Cassis Alvan Dame, M.D.        01/30/2022 7:48 AM

## 2022-01-30 NOTE — Anesthesia Preprocedure Evaluation (Addendum)
Anesthesia Evaluation  Patient identified by MRN, date of birth, ID band Patient awake    Reviewed: Allergy & Precautions, NPO status , Patient's Chart, lab work & pertinent test results, reviewed documented beta blocker date and time   Airway Mallampati: III  TM Distance: >3 FB Neck ROM: Full    Dental  (+) Teeth Intact, Dental Advisory Given   Pulmonary sleep apnea and Continuous Positive Airway Pressure Ventilation , COPD (2L O2 at night),  oxygen dependent, former smoker   Pulmonary exam normal breath sounds clear to auscultation       Cardiovascular hypertension, Pt. on home beta blockers and Pt. on medications Normal cardiovascular exam+ dysrhythmias Atrial Fibrillation  Rhythm:Regular Rate:Normal  TTE 2021 1. Left ventricular ejection fraction, by estimation, is 60 to 65%. The  left ventricle has normal function. The left ventricle has no regional  wall motion abnormalities. There is mild left ventricular hypertrophy.  Left ventricular diastolic function  could not be evaluated.   2. Right ventricular systolic function is normal. The right ventricular  size is normal. There is normal pulmonary artery systolic pressure.   3. The mitral valve is normal in structure. Trivial mitral valve  regurgitation. No evidence of mitral stenosis.   4. The aortic valve is tricuspid. Aortic valve regurgitation is not  visualized. Mild aortic valve sclerosis is present, with no evidence of  aortic valve stenosis.   5. The inferior vena cava is normal in size with greater than 50%  respiratory variability, suggesting right atrial pressure of 3 mmHg.   Event Monitor 2021  7 day event monitor. Data available from 74% of planned time  Min HR 66, Max HR 132, Avg HR 84  No symptoms reported  She is in atrial flutter throughout the study that is overall rate controlled. Isolated episode of aflutter with aberrancy.     Neuro/Psych   Headaches PSYCHIATRIC DISORDERS Anxiety Depression       GI/Hepatic Neg liver ROS,GERD  ,,  Endo/Other  diabetes, Type 2, Oral Hypoglycemic AgentsHypothyroidism    Renal/GU negative Renal ROS  negative genitourinary   Musculoskeletal negative musculoskeletal ROS (+)    Abdominal   Peds  Hematology  (+) Blood dyscrasia (on eliquis, last dose 11/19), anemia   Anesthesia Other Findings   Reproductive/Obstetrics                             Anesthesia Physical Anesthesia Plan  ASA: 3  Anesthesia Plan: Spinal   Post-op Pain Management: Ofirmev IV (intra-op)*   Induction:   PONV Risk Score and Plan: 2 and Treatment may vary due to age or medical condition, Ondansetron and Dexamethasone  Airway Management Planned: Natural Airway  Additional Equipment:   Intra-op Plan:   Post-operative Plan:   Informed Consent: I have reviewed the patients History and Physical, chart, labs and discussed the procedure including the risks, benefits and alternatives for the proposed anesthesia with the patient or authorized representative who has indicated his/her understanding and acceptance.     Dental advisory given  Plan Discussed with: CRNA  Anesthesia Plan Comments:        Anesthesia Quick Evaluation

## 2022-01-30 NOTE — Progress Notes (Signed)
PROGRESS NOTE    Miranda Wilkinson  FKC:[id number] DOB: [birth year] DOA: 01/26/2022 PCP: Caryl Bis, MD    Chief Complaint  Patient presents with   Fall    Brief Narrative:  Patient is 86 year old female history of hypertension, hyperlipidemia, type 2 diabetes, A-fib on Eliquis, hypothyroidism, GERD, CKD stage IIIa, hard of hearing who presented after mechanical fall with right hip fracture.  Patient noted to have been on Eliquis prior to admission as such surgical/operative correction held until Eliquis washout done.  Patient subsequently transferred to [facility] for operative correction per Dr. Alvan Dame.   Assessment & Plan:   Principal Problem:   Closed displaced fracture of right femoral neck (HCC) Active Problems:   Acquired hypothyroidism   Essential hypertension, benign   Mixed hyperlipidemia   Type 2 diabetes mellitus with hyperglycemia (HCC)   Hyponatremia   Leukocytosis   Atrial fibrillation, chronic (HCC)   GERD (gastroesophageal reflux disease)   OSA on CPAP   Obesity (BMI 30-39.9)   Chronic kidney disease, stage 3a ([place])   UTI (urinary tract infection)   S/P total right hip arthroplasty  #1 closed displaced fracture right femoral neck -Secondary to mechanical fall. -Patient noted last dose of Eliquis was the p.m. dose of 01/26/2022. -Patient seen by orthopedics and unable to do surgical correction and to Eliquis wash out. -Patient transferred to [facility] for surgical correction per orthopedics, Dr. Alvan Dame which per RN will be performed tomorrow. -Decreased morphine to 1 mg every 4 hours as needed pain as patient noted to have soft blood pressure and increased O2 requirements after morphine dose given early on today. -Discontinued Lovenox preoperatively on postop DVT prophylaxis per orthopedics. -Patient is status post right total hip replacement per orthopedics, Dr. Alvan Dame today 01/30/2022. -Pain management per orthopedics. -Per orthopedics.  2.   Leukocytosis -Patient noted to have a leukocytosis, low grade temp of 100.4. -Chest x-ray done with no acute infiltrate noted. -Urinalysis concerning for UTI. -Urine cultures > 100,000 Morganella morganii sensitivities pending. -Continue IV Rocephin.  3.  Probable UTI -Urine cultures with >100,000 colonies of Morganella Morganii, sensitivities pending.   -Continue IV Rocephin.    4.  Diabetes mellitus type 2 -Hemoglobin A1c 7.6 (01/26/2022) -CBG 173 this morning. -Continue to hold home regimen oral hypoglycemic agents. -SSI. -May need to start low-dose long-acting insulin.  5.  Hypothyroidism -Synthroid.  6.  Hypertension -BP noted to be soft this morning.  Concern morphine may be playing a role with decreased blood pressure. -Decreased morphine to 1 mg every 4 hours as needed. -Continue to hold patient's lisinopril. -Continue Zebeta. -IV albumin x 1.  6.  GERD -PPI.  7.  Hyperlipidemia -Statin.  8.  AKI on CKD stage IIIa -Slight bump in creatinine. -Continue to hold lisinopril. -On gentle hydration. -Follow.  9.  Class II obesity/OSA -Labs to modification. -CPAP nightly -Lifestyle modification.    DVT prophylaxis: SCDs preop, postop per orthopedics. Code Status: Full Family Communication: Updated patient, no family at bedside.  Disposition: TBD  Status is: Inpatient Remains inpatient appropriate because: Severity of illness   Consultants:  Orthopedics: Dr.Cairns 01/28/2022  Procedures:  CT head 01/26/2022 Chest x-ray 01/29/2022 Right total hip replacement through an anterior approach per orthopedics: Dr. Alvan Dame 01/30/2022  Antimicrobials:  IV Rocephin 01/29/2022>>>>   Subjective: Laying in bed, somewhat drowsy just returned from the OR.  Denies any chest pain or significant shortness of breath.  No abdominal pain.  Asking how long she is going to need  to be in the hospital for.    Objective: Vitals:   01/30/22 1000 01/30/22 1015 01/30/22 1030  01/30/22 1234  BP: 123/68 117/75 (!) 141/75   Pulse: (!) 45 88 90   Resp: '17 18 18   '$ Temp:   (!) 97.5 F (36.4 C)   TempSrc:   Oral   SpO2: 92% 92% 95%   Weight:    108.1 kg  Height:        Intake/Output Summary (Last 24 hours) at 01/30/2022 1304 Last data filed at 01/30/2022 0934 Gross per 24 hour  Intake 1715 ml  Output 950 ml  Net 765 ml    Filed Weights   01/26/22 2102 01/30/22 1234  Weight: 106 kg 108.1 kg    Examination:  General exam: Appears calm and comfortable. HOH Respiratory system: Decreased crackles noted.  No wheezing.  Decreased breath sounds in the bases.  Fair air movement.   Cardiovascular system: Irregularly irregular.  No JVD, no murmurs rubs or gallops.  No lower extremity edema.   Gastrointestinal system: Abdomen is soft, nontender, nondistended, positive bowel sounds.  No rebound.  No guarding.  Central nervous system: Alert and oriented. No focal neurological deficits. Extremities: Right hip with ice noted.  Some tenderness to palpation.    Skin: No rashes, lesions or ulcers Psychiatry: Judgement and insight appear fair.  Mood & affect appropriate.     Data Reviewed: I have personally reviewed following labs and imaging studies  CBC: Recent Labs  Lab 01/26/22 2135 01/27/22 0849 01/29/22 0509 01/30/22 0505  WBC 13.8* 11.0* 10.5 12.1*  NEUTROABS  --   --   --  9.5*  HGB 15.4* 14.5 13.3 12.8  HCT 45.3 42.4 40.6 39.7  MCV 94.8 94.9 96.9 97.3  PLT 245 218 177 174     Basic Metabolic Panel: Recent Labs  Lab 01/26/22 2135 01/27/22 0849 01/29/22 0509 01/30/22 0505  NA 132* 135 130* 129*  K 3.7 3.9 4.3 4.2  CL 95* 98 96* 95*  CO2 '25 28 26 25  '$ GLUCOSE 209* 182* 188* 164*  BUN 15 14 25* 38*  CREATININE 1.18* 1.01* 1.50* 1.76*  CALCIUM 9.8 9.7 9.3 9.1  MG  --  1.7  --   --   PHOS  --  3.0  --  3.2     GFR: Estimated Creatinine Clearance: 29.1 mL/min (A) (by C-G formula based on SCr of 1.76 mg/dL (H)).  Liver Function  Tests: Recent Labs  Lab 01/27/22 0849 01/30/22 0505  AST 30  --   ALT 28  --   ALKPHOS 53  --   BILITOT 0.8  --   PROT 7.0  --   ALBUMIN 3.5 3.5     CBG: Recent Labs  Lab 01/29/22 1128 01/29/22 1620 01/29/22 2131 01/30/22 0935 01/30/22 1135  GLUCAP 231* 182* 132* 173* 205*      Recent Results (from the past 240 hour(s))  MRSA Next Gen by PCR, Nasal     Status: None   Collection Time: 01/29/22  5:17 AM   Specimen: Nasal Mucosa; Nasal Swab  Result Value Ref Range Status   MRSA by PCR Next Gen NOT DETECTED NOT DETECTED Final    Comment: (NOTE) The GeneXpert MRSA Assay (FDA approved for NASAL specimens only), is one component of a comprehensive MRSA colonization surveillance program. It is not intended to diagnose MRSA infection nor to guide or monitor treatment for MRSA infections. Test performance is not FDA approved in patients less  than 37 years old. Performed at [facility], [place] [address] [place], [place] [address]   Urine Culture     Status: Abnormal (Preliminary result)   Collection Time: 01/29/22 10:20 AM   Specimen: Urine, Catheterized  Result Value Ref Range Status   Specimen Description   Final    URINE, CATHETERIZED Performed at [place] [address] [place], [place] [address]    Special Requests   Final    NONE Performed at [facility], [place] [address], [place], [place] [address]    Culture (A)  Final    >=100,000 COLONIES/mL GRAM NEGATIVE RODS SUSCEPTIBILITIES TO FOLLOW Performed at [facility], [place] [address], [place], [place] [address]    Report Status PENDING  Incomplete  Urine Culture     Status: Abnormal (Preliminary result)   Collection Time: 01/29/22 12:54 PM   Specimen: Urine, Clean Catch  Result Value Ref Range Status   Specimen Description   Final    URINE, CLEAN CATCH Performed at [facility], [place] [address], [place], [place] [address]     Special Requests   Final    NONE Performed at [facility], [place] [address], [place], [place] [address]    Culture (A)  Final    >=100,000 COLONIES/mL GRAM NEGATIVE RODS SUSCEPTIBILITIES TO FOLLOW Performed at [facility], [place] [address], [place], [place] [address]    Report Status PENDING  Incomplete         Radiology Studies: DG Pelvis Portable  Result Date: 01/30/2022 CLINICAL DATA:  RIGHT total hip arthroplasty EXAM: PORTABLE PELVIS 1-2 VIEWS COMPARISON:  Prior radiographs FINDINGS: RIGHT total hip arthroplasty identified without dislocation or complicating features on this single view. IMPRESSION: RIGHT total hip arthroplasty without complicating features. Electronically Signed   By: Margarette Canada M.D.   On: 01/30/2022 10:41   DG HIP UNILAT WITH PELVIS 1V RIGHT  Result Date: 01/30/2022 CLINICAL DATA:  Right hip fracture. EXAM: DG HIP (WITH OR WITHOUT PELVIS) 1V RIGHT COMPARISON:  01/26/2022 FINDINGS: Fluoroscopic images demonstrate a fracture involving the right femoral neck. Right total hip arthroplasty was performed. Right hip arthroplasty appears located on these images without complicating feature. Radiation Exposure Index (as provided by the fluoroscopic device): 3.15 mGy Kerma IMPRESSION: 1. Right total hip arthroplasty without complicating features. Electronically Signed   By: Markus Daft M.D.   On: 01/30/2022 09:41   DG C-Arm 1-60 Min-No Report  Result Date: 01/30/2022 Fluoroscopy was utilized by the requesting physician.  No radiographic interpretation.   DG C-Arm 1-60 Min-No Report  Result Date: 01/30/2022 Fluoroscopy was utilized by the requesting physician.  No radiographic interpretation.   DG CHEST PORT 1 VIEW  Result Date: 01/29/2022 CLINICAL DATA:  Fever EXAM: PORTABLE CHEST 1 VIEW COMPARISON:  Radiograph 12/08/2019 FINDINGS: Unchanged large cardiac silhouette. There are mild interstitial opacities. No airspace consolidation. No  pleural effusion or pneumothorax. No acute osseous abnormality. Thoracic spondylosis. IMPRESSION: Mild diffuse interstitial opacities, suggesting interstitial edema. Unchanged cardiomegaly. No focal airspace consolidation. Electronically Signed   By: Maurine Simmering M.D.   On: 01/29/2022 10:55        Scheduled Meds:  [START ON 01/31/2022] apixaban  5 mg Oral BID   bisoprolol  20 mg Oral Daily   [START ON 01/31/2022] dexamethasone (DECADRON) injection  10 mg Intravenous Once   docusate sodium  100 mg Oral BID   insulin aspart  0-9 Units Subcutaneous TID  WC   levothyroxine  112 mcg Oral Q0600   pantoprazole  40 mg Oral Daily   polyethylene glycol  17 g Oral BID   senna-docusate  2 tablet Oral BID   simvastatin  20 mg Oral Q supper   traZODone  100 mg Oral QHS   Continuous Infusions:  sodium chloride 75 mL/hr at 01/30/22 1236    ceFAZolin (ANCEF) IV     cefTRIAXone (ROCEPHIN)  IV Stopped (01/29/22 1124)     LOS: 4 days    Time spent: 40 minutes.    Irine Seal, MD [facility]   To contact the attending provider between 7A-7P or the covering provider during after hours 7P-7A, please log into the web site [email or web address] and access using universal [place] password for that web site. If you do not have the password, please call the hospital operator.  01/30/2022, 1:04 PM

## 2022-01-31 DIAGNOSIS — N1831 Chronic kidney disease, stage 3a: Secondary | ICD-10-CM | POA: Diagnosis not present

## 2022-01-31 DIAGNOSIS — S72001A Fracture of unspecified part of neck of right femur, initial encounter for closed fracture: Secondary | ICD-10-CM | POA: Diagnosis not present

## 2022-01-31 DIAGNOSIS — K219 Gastro-esophageal reflux disease without esophagitis: Secondary | ICD-10-CM | POA: Diagnosis not present

## 2022-01-31 DIAGNOSIS — N3000 Acute cystitis without hematuria: Secondary | ICD-10-CM | POA: Diagnosis not present

## 2022-01-31 LAB — BASIC METABOLIC PANEL
Anion gap: 11 (ref 5–15)
BUN: 32 mg/dL — ABNORMAL HIGH (ref 8–23)
CO2: 23 mmol/L (ref 22–32)
Calcium: 8.6 mg/dL — ABNORMAL LOW (ref 8.9–10.3)
Chloride: 95 mmol/L — ABNORMAL LOW (ref 98–111)
Creatinine, Ser: 1.22 mg/dL — ABNORMAL HIGH (ref 0.44–1.00)
GFR, Estimated: 44 mL/min — ABNORMAL LOW (ref >60)
Glucose, Bld: 233 mg/dL — ABNORMAL HIGH (ref 70–99)
Potassium: 4.5 mmol/L (ref 3.5–5.1)
Sodium: 129 mmol/L — ABNORMAL LOW (ref 135–145)

## 2022-01-31 LAB — URINE CULTURE
Culture: >=100000 — AB
Culture: >=100000 — AB

## 2022-01-31 LAB — CBC
HCT: 34 % — ABNORMAL LOW (ref 36.0–46.0)
Hemoglobin: 11.5 g/dL — ABNORMAL LOW (ref 12.0–15.0)
MCH: 32.7 pg (ref 26.0–34.0)
MCHC: 33.8 g/dL (ref 30.0–36.0)
MCV: 96.6 fL (ref 80.0–100.0)
Platelets: 158 10*3/uL (ref 150–400)
RBC: 3.52 MIL/uL — ABNORMAL LOW (ref 3.87–5.11)
RDW: 13.1 % (ref 11.5–15.5)
WBC: 11.4 10*3/uL — ABNORMAL HIGH (ref 4.0–10.5)
nRBC: 0 % (ref 0.0–0.2)

## 2022-01-31 LAB — GLUCOSE, CAPILLARY
Glucose-Capillary: 215 mg/dL — ABNORMAL HIGH (ref 70–99)
Glucose-Capillary: 277 mg/dL — ABNORMAL HIGH (ref 70–99)
Glucose-Capillary: 299 mg/dL — ABNORMAL HIGH (ref 70–99)

## 2022-01-31 MED ORDER — ACETAMINOPHEN 500 MG PO TABS: 500.0000 mg | ORAL_TABLET | Freq: Three times a day (TID) | ORAL | Status: DC

## 2022-01-31 MED ORDER — WITCH HAZEL-GLYCERIN EX PADS
MEDICATED_PAD | CUTANEOUS | Status: DC | PRN
  Filled 2022-01-31: qty 100

## 2022-01-31 MED ORDER — SULFAMETHOXAZOLE-TRIMETHOPRIM 800-160 MG PO TABS: 1.0000 | ORAL_TABLET | Freq: Two times a day (BID) | ORAL | Status: DC

## 2022-01-31 MED ORDER — INSULIN GLARGINE-YFGN 100 UNIT/ML ~~LOC~~ SOLN
10.0000 [IU] | Freq: Every day | SUBCUTANEOUS | Status: DC
  Administered 2022-01-31 – 2022-02-05 (×6): 10 [IU] via SUBCUTANEOUS
  Filled 2022-01-31 (×6): qty 0.1

## 2022-01-31 MED ORDER — SODIUM CHLORIDE 0.9 % IV SOLN
2.0000 g | INTRAVENOUS | Status: AC
  Administered 2022-01-31 – 2022-02-02 (×3): 2 g via INTRAVENOUS
  Filled 2022-01-31 (×3): qty 20

## 2022-01-31 NOTE — Inpatient Diabetes Management (Signed)
Inpatient Diabetes Program Recommendations  AACE/ADA: New Consensus Statement on Inpatient Glycemic Control (2015)  Target Ranges:  Prepandial:   less than 140 mg/dL      Peak postprandial:   less than 180 mg/dL (1-2 hours)      Critically ill patients:  140 - 180 mg/dL    Latest Reference Range & Units 01/30/22 09:35 01/30/22 11:35 01/30/22 16:30 01/30/22 21:30  Glucose-Capillary 70 - 99 mg/dL 173 (H)  8 mg Decadron given '@0816'$  205 (H)  3 units Novolog  380 (H)  9 units Novolog  282 (H)  (H): Data is abnormally high  Latest Reference Range & Units 01/31/22 08:54  Glucose-Capillary 70 - 99 mg/dL 215 (H)  3 units Novolog  10 mg Decadron given '@0924'$   (H): Data is abnormally high    Home DM Meds: Metformin 1000 mg QPM  Current Orders: Novolog Sensitive Correction Scale/ SSI (0-9 units) TID AC    MD- Note pt received Decadron yesterday AM and again this AM  CBGs have been elevated >200--Looks like AM CBGs have been elevated for several days  Please consider starting Basal insulin in-hospital:  Semglee 10 units QHS (0.1 units/kg)  May also consider incerasing the Novolog SSI to the 0-15 unit scale   --Will follow patient during hospitalization--  Wyn Quaker RN, MSN, [place] Diabetes Coordinator Inpatient Glycemic Control Team Team Pager: [phone] (8a-5p)

## 2022-01-31 NOTE — Progress Notes (Signed)
Physical Therapy Treatment Patient Details Name: Miranda Wilkinson MRN: [id number] DOB: Nov 29, 1935 Today's Date: 01/31/2022   History of Present Illness 86 year old female history of hypertension, hyperlipidemia, type 2 diabetes, A-fib on Eliquis, hypothyroidism, GERD, CKD stage IIIa, OSA, hard of hearing who presented after mechanical fall with right hip fracture.  Pt s/p Right THA anterior approach on 01/30/22.    PT Comments    Pt uncomfortable in recliner and assisted back to bed.  Pt's SPO2 dropped again this afternoon with transfer on room air (see mobility section below).  RN notified of sats and pt's request for pain meds.  Continue to recommend SNF.    Recommendations for follow up therapy are one component of a multi-disciplinary discharge planning process, led by the attending physician.  Recommendations may be updated based on patient status, additional functional criteria and insurance authorization.  Follow Up Recommendations  Skilled nursing-short term rehab (<3 hours/day) Can patient physically be transported by private vehicle: No   Assistance Recommended at Discharge Frequent or constant Supervision/Assistance  Patient can return home with the following A lot of help with walking and/or transfers;A lot of help with bathing/dressing/bathroom;Help with stairs or ramp for entrance   Equipment Recommendations  Rolling walker (2 wheels)    Recommendations for Other Services       Precautions / Restrictions Precautions Precautions: Fall Precaution Comments: monitor sats Restrictions Weight Bearing Restrictions: No RLE Weight Bearing: Weight bearing as tolerated     Mobility  Bed Mobility Overal bed mobility: Needs Assistance Bed Mobility: Sit to Supine     Supine to sit: Mod assist, HOB elevated Sit to supine: Mod assist   General bed mobility comments: assist for lower body    Transfers Overall transfer level: Needs assistance Equipment used: Rolling  walker (2 wheels) Transfers: Sit to/from Stand, Bed to chair/wheelchair/BSC Sit to Stand: Min assist, +2 safety/equipment   Step pivot transfers: Min assist, +2 safety/equipment       General transfer comment: multimodal cues for safety and technique, pt better able to assist with sit to stand with bil armrests on recliner; SPO2 80% again on room air (with different pulse ox) after transfer so reapplied 2L O2 [place] and RN notified   SPO2 90% on 2L O2 prior to therapist leaving room.    Ambulation/Gait                   Stairs             Wheelchair Mobility    Modified Rankin (Stroke Patients Only)       Balance Overall balance assessment: History of Falls                                          Cognition Arousal/Alertness: Awake/alert Behavior During Therapy: WFL for tasks assessed/performed Overall Cognitive Status: Difficult to assess                                 General Comments: follows commands and cues, very HOH even with hearing aids        Exercises      General Comments        Pertinent Vitals/Pain Pain Assessment Pain Assessment: Faces Faces Pain Scale: Hurts whole lot Pain Location: right hip Pain Descriptors / Indicators: Grimacing, Sore Pain Intervention(s): Patient requesting  pain meds-RN notified, Repositioned, Monitored during session    Home Living Family/patient expects to be discharged to:: Private residence Living Arrangements: Children Available Help at Discharge: Family;Available 24 hours/day Type of Home: House Home Access: Stairs to enter   [employer] of Steps: 2   Home Layout: One level Home Equipment: None      Prior Function            PT Goals (current goals can now be found in the care plan section) Acute Rehab PT Goals PT Goal Formulation: With patient/family Time For Goal Achievement: 02/14/22 Potential to Achieve Goals: Good Progress towards PT goals:  Progressing toward goals    Frequency    Min 3X/week      PT Plan Current plan remains appropriate    Co-evaluation              AM-PAC PT "6 Clicks" Mobility   Outcome Measure  Help needed turning from your back to your side while in a flat bed without using bedrails?: A Lot Help needed moving from lying on your back to sitting on the side of a flat bed without using bedrails?: A Lot Help needed moving to and from a bed to a chair (including a wheelchair)?: A Lot Help needed standing up from a chair using your arms (e.g., wheelchair or bedside chair)?: A Lot Help needed to walk in hospital room?: A Lot Help needed climbing 3-5 steps with a railing? : Total 6 Click Score: 11    End of Session Equipment Utilized During Treatment: Gait belt;Oxygen Activity Tolerance: Patient tolerated treatment well;Patient limited by pain Patient left: in bed;with call bell/phone within reach;with bed alarm set;with family/visitor present Nurse Communication: Mobility status PT Visit Diagnosis: Difficulty in walking, not elsewhere classified (R26.2)     Time: 1429-1440 PT Time Calculation (min) (ACUTE ONLY): 11 min  Charges:  $Therapeutic Activity: 8-22 mins                     Jannette Spanner PT, DPT Physical Therapist Acute Rehabilitation Services Preferred contact method: Secure Chat Weekend Pager Only: [phone] Office: [place] 01/31/2022, 4:49 PM

## 2022-01-31 NOTE — Evaluation (Signed)
Physical Therapy Evaluation Patient Details Name: Miranda Wilkinson MRN: [id number] DOB: 11/28/1935 Today's Date: 01/31/2022  History of Present Illness  86 year old female history of hypertension, hyperlipidemia, type 2 diabetes, A-fib on Eliquis, hypothyroidism, GERD, CKD stage IIIa, OSA, hard of hearing who presented after mechanical fall with right hip fracture.  Pt s/p Right THA anterior approach on 01/30/22.  Clinical Impression  Pt admitted with above diagnosis.  Pt currently with functional limitations due to the deficits listed below (see PT Problem List). Pt will benefit from skilled PT to increase their independence and safety with mobility to allow discharge to the venue listed below.  Pt ambulatory with short distances and shuffling at baseline per daughter.  Pt currently requiring at least mod assist for bed mobility and transfers at this time.  Currently recommend SNF upon d/c.         Recommendations for follow up therapy are one component of a multi-disciplinary discharge planning process, led by the attending physician.  Recommendations may be updated based on patient status, additional functional criteria and insurance authorization.  Follow Up Recommendations Skilled nursing-short term rehab (<3 hours/day) Can patient physically be transported by private vehicle: No    Assistance Recommended at Discharge Frequent or constant Supervision/Assistance  Patient can return home with the following  A lot of help with walking and/or transfers;A lot of help with bathing/dressing/bathroom;Help with stairs or ramp for entrance    Equipment Recommendations Rolling walker (2 wheels)  Recommendations for Other Services       Functional Status Assessment Patient has had a recent decline in their functional status and demonstrates the ability to make significant improvements in function in a reasonable and predictable amount of time.     Precautions / Restrictions  Precautions Precautions: Fall Precaution Comments: monitor sats Restrictions Weight Bearing Restrictions: No RLE Weight Bearing: Weight bearing as tolerated      Mobility  Bed Mobility Overal bed mobility: Needs Assistance Bed Mobility: Supine to Sit     Supine to sit: Mod assist, HOB elevated     General bed mobility comments: heavily reliant on bed rail to self assist upper body, assist for R LE and trunk uprigth    Transfers Overall transfer level: Needs assistance Equipment used: Rolling walker (2 wheels) Transfers: Sit to/from Stand, Bed to chair/wheelchair/BSC Sit to Stand: Mod assist, +2 physical assistance   Step pivot transfers: Mod assist, +2 physical assistance       General transfer comment: multimodal cues for safety and technique, pt with increased pain limiting to transfer only; SPO2 80% on room air after transfer so reapplied 2L O2 [place]    Ambulation/Gait                  Stairs            Wheelchair Mobility    Modified Rankin (Stroke Patients Only)       Balance Overall balance assessment: History of Falls                                           Pertinent Vitals/Pain Pain Assessment Pain Assessment: Faces Faces Pain Scale: Hurts even more Pain Location: right hip Pain Descriptors / Indicators: Grimacing, Sore Pain Intervention(s): Repositioned, Monitored during session    Home Living Family/patient expects to be discharged to:: Private residence Living Arrangements: Children Available Help at Discharge: Family;Available 24 hours/day  Type of Home: House Home Access: Stairs to enter   [employer] of Steps: 2   Home Layout: One level Home Equipment: None      Prior Function Prior Level of Function : Independent/Modified Independent             Mobility Comments: daughter reports pt shuffles and keeps arms outwards with ambulating, does not use assistive device       Hand  Dominance        Extremity/Trunk Assessment        Lower Extremity Assessment Lower Extremity Assessment: Generalized weakness;RLE deficits/detail RLE Deficits / Details: requiring assist due to pain RLE: Unable to fully assess due to pain       Communication   Communication: HOH  Cognition Arousal/Alertness: Awake/alert Behavior During Therapy: WFL for tasks assessed/performed Overall Cognitive Status: Difficult to assess                                 General Comments: follows commands and cues, very HOH even with hearing aid left ear        General Comments      Exercises     Assessment/Plan    PT Assessment Patient needs continued PT services  PT Problem List Decreased strength;Decreased mobility;Decreased activity tolerance;Decreased balance;Decreased knowledge of use of DME;Pain;Cardiopulmonary status limiting activity;Decreased knowledge of precautions       PT Treatment Interventions Gait training;DME instruction;Therapeutic exercise;Balance training;Functional mobility training;Therapeutic activities;Patient/family education    PT Goals (Current goals can be found in the Care Plan section)  Acute Rehab PT Goals PT Goal Formulation: With patient/family Time For Goal Achievement: 02/14/22 Potential to Achieve Goals: Good    Frequency Min 3X/week     Co-evaluation               AM-PAC PT "6 Clicks" Mobility  Outcome Measure Help needed turning from your back to your side while in a flat bed without using bedrails?: A Lot Help needed moving from lying on your back to sitting on the side of a flat bed without using bedrails?: A Lot Help needed moving to and from a bed to a chair (including a wheelchair)?: A Lot Help needed standing up from a chair using your arms (e.g., wheelchair or bedside chair)?: A Lot Help needed to walk in hospital room?: Total Help needed climbing 3-5 steps with a railing? : Total 6 Click Score: 10    End  of Session Equipment Utilized During Treatment: Gait belt;Oxygen Activity Tolerance: Patient tolerated treatment well;Patient limited by pain Patient left: in chair;with call bell/phone within reach;with family/visitor present Nurse Communication: Mobility status PT Visit Diagnosis: Difficulty in walking, not elsewhere classified (R26.2)    Time: 4403-4742 PT Time Calculation (min) (ACUTE ONLY): 13 min   Charges:   PT Evaluation $PT Eval Low Complexity: 1 Low        Kati PT, DPT Physical Therapist Acute Rehabilitation Services Preferred contact method: Secure Chat Weekend Pager Only: [phone] Office: [place] 01/31/2022, 1:58 PM

## 2022-01-31 NOTE — Progress Notes (Signed)
Patient ID: Miranda Wilkinson, female   DOB: 1935-10-10, 86 y.o.   MRN: [id number] Subjective: 1 Day Post-Op Procedure(s) (LRB): TOTAL HIP ARTHROPLASTY ANTERIOR APPROACH (Right)    Patient in bed with CPAP on.  Severely hard of hearing.  Unable to get good assessment in addition to some confusion since the hip fracture  No events reported overnight  Objective:   VITALS:   Vitals:   01/30/22 2055 01/31/22 0627  BP: (!) 134/56 133/72  Pulse: 65 74  Resp: 18 20  Temp: (!) 97.5 F (36.4 C) 99 F (37.2 C)  SpO2: 100%     Neurovascular intact Incision: dressing C/D/I - right hip  LABS Recent Labs    01/29/22 0509 01/30/22 0505 01/31/22 0449  HGB 13.3 12.8 11.5*  HCT 40.6 39.7 34.0*  WBC 10.5 12.1* 11.4*  PLT 177 174 158    Recent Labs    01/29/22 0509 01/30/22 0505 01/31/22 0449  NA 130* 129* 129*  K 4.3 4.2 4.5  BUN 25* 38* 32*  CREATININE 1.50* 1.76* 1.22*  GLUCOSE 188* 164* 233*    No results for input(s): "LABPT", "INR" in the last 72 hours.   Assessment/Plan: 1 Day Post-Op Procedure(s) (LRB): TOTAL HIP ARTHROPLASTY ANTERIOR APPROACH (Right)   Advance diet Up with therapy - WBAT RLE Resume eliquis Norco for pain but would try to limit to allow for mental clearing

## 2022-01-31 NOTE — Progress Notes (Signed)
PROGRESS NOTE    Miranda Wilkinson  CBJ:[id number] DOB: February 13, 1936 DOA: 01/26/2022 PCP: Caryl Bis, MD    Chief Complaint  Patient presents with   Fall    Brief Narrative:  Patient is 86 year old female history of hypertension, hyperlipidemia, type 2 diabetes, A-fib on Eliquis, hypothyroidism, GERD, CKD stage IIIa, hard of hearing who presented after mechanical fall with right hip fracture.  Patient noted to have been on Eliquis prior to admission as such surgical/operative correction held until Eliquis washout done.  Patient subsequently transferred to [facility] for operative correction per Dr. Alvan Dame.   Assessment & Plan:   Principal Problem:   Closed displaced fracture of right femoral neck (HCC) Active Problems:   Acquired hypothyroidism   Essential hypertension, benign   Mixed hyperlipidemia   Type 2 diabetes mellitus with hyperglycemia (HCC)   Hyponatremia   Leukocytosis   Atrial fibrillation, chronic (HCC)   GERD (gastroesophageal reflux disease)   OSA on CPAP   Obesity (BMI 30-39.9)   Chronic kidney disease, stage 3a ([place])   UTI (urinary tract infection)   S/P total right hip arthroplasty  #1 closed displaced fracture right femoral neck -Secondary to mechanical fall. -Patient noted last dose of Eliquis was the p.m. dose of 01/26/2022. -Patient seen by orthopedics and unable to do surgical correction and to Eliquis wash out. -Patient transferred to [facility] for surgical correction per orthopedics, Dr. Alvan Dame which per RN will be performed tomorrow. -Decreased morphine to 1 mg every 4 hours as needed pain as patient noted to have soft blood pressure and increased O2 requirements after morphine dose given early on today. -Discontinued Lovenox preoperatively on postop DVT prophylaxis per orthopedics. -Patient is status post right total hip replacement per orthopedics, Dr. Alvan Dame today 01/30/2022. -Pain management per orthopedics. -Per orthopedics.  2.   Leukocytosis -Patient noted to have a leukocytosis, low grade temp of 100.4 earlier on in the hospitalization. -Chest x-ray done with no acute infiltrate noted. -Urinalysis concerning for UTI. -Urine cultures > 100,000 Morganella morganii sensitivities. -Fever curve trending down. -Leukocytosis trending down.. -Continue IV Rocephin.  3.  Morganella Morganii UTI -Urine cultures with >100,000 colonies of Morganella Morganii, sensitive to ciprofloxacin, gentamicin, imipenem, Bactrim, pip-tazo. -Discussed with pharmacy and he was found IV Rocephin will cover UTI as hesitant to place patient on Bactrim due to recent AKI.  -Discontinue Foley catheter tomorrow, as per family patient was not on a chronic Foley catheter prior to admission.  4.  Diabetes mellitus type 2 -Hemoglobin A1c 7.6 (01/26/2022) -CBG 215 this morning. -Noted to have received Decadron. -Continue to hold home regimen oral hypoglycemic agents. -SSI. -Start Semglee 10 units daily.  5.  Hypothyroidism -Continue Synthroid.  6.  Hypertension -BP noted to be soft.  Concern morphine may be playing a role with decreased blood pressure. -Decreased morphine to 1 mg every 4 hours as needed. -Continue to hold patient's lisinopril. -Continue Zebeta. -Status post IV albumin x 1.  6.  GERD -Continue PPI.  7.  Hyperlipidemia -Continue statin.  8.  AKI on CKD stage IIIa -Slight bump in creatinine. -Continue to hold lisinopril. -On gentle hydration. -Follow.  9.  Class II obesity/OSA -Lifestyle modification. -CPAP nightly -Lifestyle modification.    DVT prophylaxis: Eliquis. Code Status: Full Family Communication: Updated patient, daughter at bedside.   Disposition: TBD  Status is: Inpatient Remains inpatient appropriate because: Severity of illness   Consultants:  Orthopedics: Dr.Cairns 01/28/2022  Procedures:  CT head 01/26/2022 Chest x-ray 01/29/2022 Right total  hip replacement through an anterior approach  per orthopedics: Dr. Alvan Dame 01/30/2022  Antimicrobials:  IV Rocephin 01/29/2022>>>>   Subjective: Laying in bed.  More alert today.  No chest pain, no shortness of breath, no abdominal pain.  Still with intermittent right hip pain.  Daughter at bedside.    Objective: Vitals:   01/31/22 0500 01/31/22 0627 01/31/22 0744 01/31/22 1244  BP:  133/72  120/62  Pulse:  74  90  Resp:  '20 17 16  '$ Temp:  99 F (37.2 C)  97.9 F (36.6 C)  TempSrc:    Oral  SpO2:    97%  Weight: 109.7 kg     Height:        Intake/Output Summary (Last 24 hours) at 01/31/2022 1343 Last data filed at 01/31/2022 0643 Gross per 24 hour  Intake 1172.39 ml  Output 1750 ml  Net -577.61 ml    Filed Weights   01/26/22 2102 01/30/22 1234 01/31/22 0500  Weight: 106 kg 108.1 kg 109.7 kg    Examination:  General exam: Appears calm and comfortable. HOH Respiratory system: Decreased diffuse crackles.  No wheezing.  Decreased breath sounds in the bases.  Fair air movement.  Speaking in full sentences.  Cardiovascular system: Irregularly irregular.  No JVD, no murmurs rubs or gallops.  No lower extremity edema.   Gastrointestinal system: Abdomen is soft, nontender, nondistended, positive bowel sounds.  No rebound.  No guarding.  Central nervous system: Alert and oriented. No focal neurological deficits. Extremities: Right hip wound/dressing noted.  Some tenderness to palpation.  Skin: No rashes, lesions or ulcers Psychiatry: Judgement and insight appear fair.  Mood & affect appropriate.     Data Reviewed: I have personally reviewed following labs and imaging studies  CBC: Recent Labs  Lab 01/26/22 2135 01/27/22 0849 01/29/22 0509 01/30/22 0505 01/31/22 0449  WBC 13.8* 11.0* 10.5 12.1* 11.4*  NEUTROABS  --   --   --  9.5*  --   HGB 15.4* 14.5 13.3 12.8 11.5*  HCT 45.3 42.4 40.6 39.7 34.0*  MCV 94.8 94.9 96.9 97.3 96.6  PLT 245 218 177 174 158     Basic Metabolic Panel: Recent Labs  Lab  01/26/22 2135 01/27/22 0849 01/29/22 0509 01/30/22 0505 01/31/22 0449  NA 132* 135 130* 129* 129*  K 3.7 3.9 4.3 4.2 4.5  CL 95* 98 96* 95* 95*  CO2 '25 28 26 25 23  '$ GLUCOSE 209* 182* 188* 164* 233*  BUN 15 14 25* 38* 32*  CREATININE 1.18* 1.01* 1.50* 1.76* 1.22*  CALCIUM 9.8 9.7 9.3 9.1 8.6*  MG  --  1.7  --   --   --   PHOS  --  3.0  --  3.2  --      GFR: Estimated Creatinine Clearance: 42.3 mL/min (A) (by C-G formula based on SCr of 1.22 mg/dL (H)).  Liver Function Tests: Recent Labs  Lab 01/27/22 0849 01/30/22 0505  AST 30  --   ALT 28  --   ALKPHOS 53  --   BILITOT 0.8  --   PROT 7.0  --   ALBUMIN 3.5 3.5     CBG: Recent Labs  Lab 01/30/22 1135 01/30/22 1630 01/30/22 2130 01/31/22 0854 01/31/22 1240  GLUCAP 205* 380* 282* 215* 277*      Recent Results (from the past 240 hour(s))  MRSA Next Gen by PCR, Nasal     Status: None   Collection Time: 01/29/22  5:17 AM  Specimen: Nasal Mucosa; Nasal Swab  Result Value Ref Range Status   MRSA by PCR Next Gen NOT DETECTED NOT DETECTED Final    Comment: (NOTE) The GeneXpert MRSA Assay (FDA approved for NASAL specimens only), is one component of a comprehensive MRSA colonization surveillance program. It is not intended to diagnose MRSA infection nor to guide or monitor treatment for MRSA infections. Test performance is not FDA approved in patients less than 62 years old. Performed at [facility], [place] [address] [place], [place] [address]   Urine Culture     Status: Abnormal   Collection Time: 01/29/22 10:20 AM   Specimen: Urine, Catheterized  Result Value Ref Range Status   Specimen Description   Final    URINE, CATHETERIZED Performed at [facility] [address] [place], [place] [address]    Special Requests   Final    NONE Performed at [facility], [place] [address] [place], [place] [address]    Culture >=100,000 COLONIES/mL [facility]  MORGANII (A)  Final   Report Status 01/31/2022 FINAL  Final   Organism ID, Bacteria MORGANELLA MORGANII (A)  Final      Susceptibility   Morganella morganii - MIC*    AMPICILLIN >=32 RESISTANT Resistant     CEFAZOLIN >=64 RESISTANT Resistant     CIPROFLOXACIN <=0.25 SENSITIVE Sensitive     GENTAMICIN <=1 SENSITIVE Sensitive     IMIPENEM 2 SENSITIVE Sensitive     NITROFURANTOIN 128 RESISTANT Resistant     TRIMETH/SULFA <=20 SENSITIVE Sensitive     AMPICILLIN/SULBACTAM 16 INTERMEDIATE Intermediate     PIP/TAZO <=4 SENSITIVE Sensitive     * >=100,000 COLONIES/mL MORGANELLA MORGANII  Urine Culture     Status: Abnormal   Collection Time: 01/29/22 12:54 PM   Specimen: Urine, Clean Catch  Result Value Ref Range Status   Specimen Description   Final    URINE, CLEAN CATCH Performed at [facility], [place] [address] [place], [place] [address]    Special Requests   Final    NONE Performed at [facility], [place] [address], [place], [place] [address]    Culture >=100,000 COLONIES/mL [facility] MORGANII (A)  Final   Report Status 01/31/2022 FINAL  Final   Organism ID, Bacteria MORGANELLA MORGANII (A)  Final      Susceptibility   Morganella morganii - MIC*    AMPICILLIN >=32 RESISTANT Resistant     CEFAZOLIN >=64 RESISTANT Resistant     CIPROFLOXACIN <=0.25 SENSITIVE Sensitive     GENTAMICIN <=1 SENSITIVE Sensitive     IMIPENEM 2 SENSITIVE Sensitive     NITROFURANTOIN 128 RESISTANT Resistant     TRIMETH/SULFA <=20 SENSITIVE Sensitive     AMPICILLIN/SULBACTAM >=32 RESISTANT Resistant     PIP/TAZO <=4 SENSITIVE Sensitive     * >=100,000 COLONIES/mL [facility] MORGANII         Radiology Studies: DG Pelvis Portable  Result Date: 01/30/2022 CLINICAL DATA:  RIGHT total hip arthroplasty EXAM: PORTABLE PELVIS 1-2 VIEWS COMPARISON:  Prior radiographs FINDINGS: RIGHT total hip arthroplasty identified without dislocation or complicating features on this  single view. IMPRESSION: RIGHT total hip arthroplasty without complicating features. Electronically Signed   By: Margarette Canada M.D.   On: 01/30/2022 10:41   DG HIP UNILAT WITH PELVIS 1V RIGHT  Result Date: 01/30/2022 CLINICAL DATA:  Right hip fracture. EXAM: DG HIP (WITH OR WITHOUT PELVIS) 1V RIGHT COMPARISON:  01/26/2022 FINDINGS: Fluoroscopic images demonstrate a fracture involving  the right femoral neck. Right total hip arthroplasty was performed. Right hip arthroplasty appears located on these images without complicating feature. Radiation Exposure Index (as provided by the fluoroscopic device): 3.15 mGy Kerma IMPRESSION: 1. Right total hip arthroplasty without complicating features. Electronically Signed   By: Markus Daft M.D.   On: 01/30/2022 09:41   DG C-Arm 1-60 Min-No Report  Result Date: 01/30/2022 Fluoroscopy was utilized by the requesting physician.  No radiographic interpretation.   DG C-Arm 1-60 Min-No Report  Result Date: 01/30/2022 Fluoroscopy was utilized by the requesting physician.  No radiographic interpretation.        Scheduled Meds:  apixaban  5 mg Oral BID   bisoprolol  20 mg Oral Daily   docusate sodium  100 mg Oral BID   insulin aspart  0-9 Units Subcutaneous TID WC   insulin glargine-yfgn  10 Units Subcutaneous Daily   levothyroxine  112 mcg Oral Q0600   pantoprazole  40 mg Oral Daily   polyethylene glycol  17 g Oral BID   senna-docusate  2 tablet Oral BID   simvastatin  20 mg Oral Q supper   traZODone  100 mg Oral QHS   Continuous Infusions:  sodium chloride 75 mL/hr at 01/31/22 0309   cefTRIAXone (ROCEPHIN)  IV 2 g (01/31/22 1228)     LOS: 5 days    Time spent: 40 minutes.    Irine Seal, MD [facility]   To contact the attending provider between 7A-7P or the covering provider during after hours 7P-7A, please log into the web site [email or web address] and access using universal [place] password for that web site. If you do not have  the password, please call the hospital operator.  01/31/2022, 1:43 PM

## 2022-02-01 DIAGNOSIS — S72001A Fracture of unspecified part of neck of right femur, initial encounter for closed fracture: Secondary | ICD-10-CM | POA: Diagnosis not present

## 2022-02-01 DIAGNOSIS — K219 Gastro-esophageal reflux disease without esophagitis: Secondary | ICD-10-CM | POA: Diagnosis not present

## 2022-02-01 DIAGNOSIS — N3000 Acute cystitis without hematuria: Secondary | ICD-10-CM | POA: Diagnosis not present

## 2022-02-01 DIAGNOSIS — N1831 Chronic kidney disease, stage 3a: Secondary | ICD-10-CM | POA: Diagnosis not present

## 2022-02-01 LAB — CBC
HCT: 34 % — ABNORMAL LOW (ref 36.0–46.0)
Hemoglobin: 11.1 g/dL — ABNORMAL LOW (ref 12.0–15.0)
MCH: 32 pg (ref 26.0–34.0)
MCHC: 32.6 g/dL (ref 30.0–36.0)
MCV: 98 fL (ref 80.0–100.0)
Platelets: 220 10*3/uL (ref 150–400)
RBC: 3.47 MIL/uL — ABNORMAL LOW (ref 3.87–5.11)
RDW: 13.5 % (ref 11.5–15.5)
WBC: 10.5 10*3/uL (ref 4.0–10.5)
nRBC: 0 % (ref 0.0–0.2)

## 2022-02-01 LAB — BASIC METABOLIC PANEL
Anion gap: 9 (ref 5–15)
BUN: 29 mg/dL — ABNORMAL HIGH (ref 8–23)
CO2: 25 mmol/L (ref 22–32)
Calcium: 9.2 mg/dL (ref 8.9–10.3)
Chloride: 98 mmol/L (ref 98–111)
Creatinine, Ser: 1.05 mg/dL — ABNORMAL HIGH (ref 0.44–1.00)
GFR, Estimated: 52 mL/min — ABNORMAL LOW (ref >60)
Glucose, Bld: 193 mg/dL — ABNORMAL HIGH (ref 70–99)
Potassium: 4.5 mmol/L (ref 3.5–5.1)
Sodium: 132 mmol/L — ABNORMAL LOW (ref 135–145)

## 2022-02-01 LAB — GLUCOSE, CAPILLARY
Glucose-Capillary: 177 mg/dL — ABNORMAL HIGH (ref 70–99)
Glucose-Capillary: 270 mg/dL — ABNORMAL HIGH (ref 70–99)
Glucose-Capillary: 229 mg/dL — ABNORMAL HIGH (ref 70–99)
Glucose-Capillary: 177 mg/dL — ABNORMAL HIGH (ref 70–99)

## 2022-02-01 MED ORDER — CHLORHEXIDINE GLUCONATE CLOTH 2 % EX PADS
6.0000 | MEDICATED_PAD | Freq: Every day | CUTANEOUS | Status: DC
  Administered 2022-02-01: 6 via TOPICAL

## 2022-02-01 NOTE — Progress Notes (Signed)
Subjective: 2 Days Post-Op Procedure(s) (LRB): TOTAL HIP ARTHROPLASTY ANTERIOR APPROACH (Right) Patient reports pain as mild.   Patient seen around this morning for Dr. Alvan Dame Patient is hard of hearing, difficulty with getting responses  Objective: Vital signs in last 24 hours: Temp:  [97.9 F (36.6 C)-98.1 F (36.7 C)] 98 F (36.7 C) (11/25 0507) Pulse Rate:  [70-90] 74 (11/25 0507) Resp:  [16-20] 20 (11/25 0507) BP: (118-148)/(62-82) 148/71 (11/25 0507) SpO2:  [91 %-97 %] 91 % (11/25 0507) Weight:  [106.4 kg] 106.4 kg (11/25 0507)  Intake/Output from previous day: 11/24 0701 - 11/25 0700 In: 734 [P.O.:450; I.V.:329; IV Piggyback:100] Out: 1750 [Urine:1750] Intake/Output this shift: No intake/output data recorded.  Recent Labs    01/30/22 0505 01/31/22 0449 02/01/22 0527  HGB 12.8 11.5* 11.1*   Recent Labs    01/31/22 0449 02/01/22 0527  WBC 11.4* 10.5  RBC 3.52* 3.47*  HCT 34.0* 34.0*  PLT 158 220   Recent Labs    01/31/22 0449 02/01/22 0527  NA 129* 132*  K 4.5 4.5  CL 95* 98  CO2 23 25  BUN 32* 29*  CREATININE 1.22* 1.05*  GLUCOSE 233* 193*  CALCIUM 8.6* 9.2   No results for input(s): "LABPT", "INR" in the last 72 hours.  Neurologically intact Neurovascular intact Sensation intact distally Intact pulses distally Dorsiflexion/Plantar flexion intact Incision: dressing C/D/I No cellulitis present Compartment soft   Assessment/Plan: 2 Days Post-Op Procedure(s) (LRB): TOTAL HIP ARTHROPLASTY ANTERIOR APPROACH (Right) Up with therapy continue working with physical therapy Looks like she is being recommended to SNF Orthopedic standpoint hip seems to be doing great   Drue Novel, PA-C [facility] with Dr. Theda Sers [phone] 02/01/2022, 8:48 AM

## 2022-02-01 NOTE — Progress Notes (Signed)
PT using personal home equipment

## 2022-02-01 NOTE — Progress Notes (Signed)
PROGRESS NOTE    Miranda Wilkinson  RKY:[id number] DOB: Aug 19, 1935 DOA: 01/26/2022 PCP: Caryl Bis, MD    Chief Complaint  Patient presents with   Fall    Brief Narrative:  Patient is 86 year old female history of hypertension, hyperlipidemia, type 2 diabetes, A-fib on Eliquis, hypothyroidism, GERD, CKD stage IIIa, hard of hearing who presented after mechanical fall with right hip fracture.  Patient noted to have been on Eliquis prior to admission as such surgical/operative correction held until Eliquis washout done.  Patient subsequently transferred to [facility] for operative correction per Dr. Alvan Dame.   Assessment & Plan:   Principal Problem:   Closed displaced fracture of right femoral neck (HCC) Active Problems:   Acquired hypothyroidism   Essential hypertension, benign   Mixed hyperlipidemia   Type 2 diabetes mellitus with hyperglycemia (HCC)   Hyponatremia   Leukocytosis   Atrial fibrillation, chronic (HCC)   GERD (gastroesophageal reflux disease)   OSA on CPAP   Obesity (BMI 30-39.9)   Chronic kidney disease, stage 3a ([place])   UTI (urinary tract infection)   S/P total right hip arthroplasty  #1 closed displaced fracture right femoral neck -Secondary to mechanical fall. -Patient noted last dose of Eliquis was the p.m. dose of 01/26/2022. -Patient seen by orthopedics and unable to do surgical correction and to Eliquis wash out. -Patient transferred to [facility] for surgical correction per orthopedics, Dr. Alvan Dame which per RN will be performed tomorrow. -Decreased morphine to 1 mg every 4 hours as needed pain as patient noted to have soft blood pressure and increased O2 requirements after morphine dose given early on today. -Discontinued Lovenox preoperatively on postop DVT prophylaxis per orthopedics. -Patient is status post right total hip replacement per orthopedics, Dr. Olin,01/30/2022. -Pain management per orthopedics. -Per orthopedics.  2.   Leukocytosis -Patient noted to have a leukocytosis, low grade temp of 100.4 earlier on in the hospitalization. -Chest x-ray done with no acute infiltrate noted. -Urinalysis concerning for UTI. -Urine cultures > 100,000 Morganella morganii sensitivities. -Fever curve trending down. -Leukocytosis trending down.. -Continue IV Rocephin.  3.  Morganella Morganii UTI -Urine cultures with >100,000 colonies of Morganella Morganii, sensitive to ciprofloxacin, gentamicin, imipenem, Bactrim, pip-tazo. -Discussed with pharmacy and noted that IV Rocephin will cover UTI as hesitant to place patient on Bactrim due to recent AKI.  -Discontinue Foley catheter tomorrow, as per family patient was not on a chronic Foley catheter prior to admission.  4.  Diabetes mellitus type 2 -Hemoglobin A1c 7.6 (01/26/2022) -CBG 177 this morning. -Noted to have received Decadron. -Continue to hold home regimen oral hypoglycemic agents. -Continue Semglee 10 units daily, SSI.  5.  Hypothyroidism -Synthroid.   6.  Hypertension -BP noted to be soft.  Concern morphine may be playing a role with decreased blood pressure. -Decreased morphine to 1 mg every 4 hours as needed. -BP currently stable on current regimen of Zebeta.   -Continue to hold lisinopril likely will not resume on discharge.  -Status post IV albumin x 1.  6.  GERD -PPI.  7.  Hyperlipidemia -Statin.  8.  AKI on CKD stage IIIa -Slight bump in creatinine during the hospitalization. -Patient placed on gentle hydration which is subsequently discontinued. -Creatinine trending back down. -Continue to hold lisinopril likely will not resume on discharge.. -Follow.  9.  Class II obesity/OSA -Lifestyle modification. -CPAP nightly -Lifestyle modification.    DVT prophylaxis: Eliquis. Code Status: Full Family Communication: Updated patient, daughter at bedside.   Disposition: TBD  Status is: Inpatient Remains inpatient appropriate because: Severity  of illness   Consultants:  Orthopedics: Dr.Cairns 01/28/2022  Procedures:  CT head 01/26/2022 Chest x-ray 01/29/2022 Right total hip replacement through an anterior approach per orthopedics: Dr. Alvan Dame 01/30/2022  Antimicrobials:  IV Rocephin 01/29/2022>>>> 02/03/2022   Subjective: Laying in bed.  States right hip pain intermittent but improved since admission.  Denies any significant shortness of breath.  Denies any chest pain.  No abdominal pain.  Daughter at bedside.   Objective: Vitals:   01/31/22 1600 01/31/22 2005 02/01/22 0507 02/01/22 1306  BP:  118/82 (!) 148/71 (!) 131/50  Pulse:  70 74 74  Resp: '17 18 20 19  '$ Temp:  98.1 F (36.7 C) 98 F (36.7 C) (!) 97.3 F (36.3 C)  TempSrc:  Oral Oral Oral  SpO2:  93% 91% 99%  Weight:   106.4 kg   Height:        Intake/Output Summary (Last 24 hours) at 02/01/2022 1329 Last data filed at 02/01/2022 0300 Gross per 24 hour  Intake 879.04 ml  Output 1750 ml  Net -870.96 ml    Filed Weights   01/30/22 1234 01/31/22 0500 02/01/22 0507  Weight: 108.1 kg 109.7 kg 106.4 kg    Examination:  General exam: Appears calm and comfortable. HOH Respiratory system: Decreased crackles.  No wheezing.  Fair air movement.  Speaking in full sentences.  Cardiovascular system: Irregularly irregular.  No JVD, no murmurs rubs or gallops.  No lower extremity edema.   Gastrointestinal system: Abdomen is soft, nontender, nondistended, positive bowel sounds.  No rebound.  No guarding.  Central nervous system: Alert and oriented. No focal neurological deficits. Extremities: Right hip wound/dressing noted, nontender to palpation.  Skin: No rashes, lesions or ulcers Psychiatry: Judgement and insight appear fair.  Mood & affect appropriate.     Data Reviewed: I have personally reviewed following labs and imaging studies  CBC: Recent Labs  Lab 01/27/22 0849 01/29/22 0509 01/30/22 0505 01/31/22 0449 02/01/22 0527  WBC 11.0* 10.5 12.1*  11.4* 10.5  NEUTROABS  --   --  9.5*  --   --   HGB 14.5 13.3 12.8 11.5* 11.1*  HCT 42.4 40.6 39.7 34.0* 34.0*  MCV 94.9 96.9 97.3 96.6 98.0  PLT 218 177 174 158 220     Basic Metabolic Panel: Recent Labs  Lab 01/27/22 0849 01/29/22 0509 01/30/22 0505 01/31/22 0449 02/01/22 0527  NA 135 130* 129* 129* 132*  K 3.9 4.3 4.2 4.5 4.5  CL 98 96* 95* 95* 98  CO2 '28 26 25 23 25  '$ GLUCOSE 182* 188* 164* 233* 193*  BUN 14 25* 38* 32* 29*  CREATININE 1.01* 1.50* 1.76* 1.22* 1.05*  CALCIUM 9.7 9.3 9.1 8.6* 9.2  MG 1.7  --   --   --   --   PHOS 3.0  --  3.2  --   --      GFR: Estimated Creatinine Clearance: 48.4 mL/min (A) (by C-G formula based on SCr of 1.05 mg/dL (H)).  Liver Function Tests: Recent Labs  Lab 01/27/22 0849 01/30/22 0505  AST 30  --   ALT 28  --   ALKPHOS 53  --   BILITOT 0.8  --   PROT 7.0  --   ALBUMIN 3.5 3.5     CBG: Recent Labs  Lab 01/31/22 0854 01/31/22 1240 01/31/22 1638 02/01/22 0754 02/01/22 1300  GLUCAP 215* 277* 299* 177* 270*      Recent Results (  from the past 240 hour(s))  MRSA Next Gen by PCR, Nasal     Status: None   Collection Time: 01/29/22  5:17 AM   Specimen: Nasal Mucosa; Nasal Swab  Result Value Ref Range Status   MRSA by PCR Next Gen NOT DETECTED NOT DETECTED Final    Comment: (NOTE) The GeneXpert MRSA Assay (FDA approved for NASAL specimens only), is one component of a comprehensive MRSA colonization surveillance program. It is not intended to diagnose MRSA infection nor to guide or monitor treatment for MRSA infections. Test performance is not FDA approved in patients less than 53 years old. Performed at [facility], [place] [address], [place], [place] [address]   Urine Culture     Status: Abnormal   Collection Time: 01/29/22 10:20 AM   Specimen: Urine, Catheterized  Result Value Ref Range Status   Specimen Description   Final    URINE, CATHETERIZED Performed at [place] [address] [place], [place] [address]    Special Requests   Final    NONE Performed at [facility], [place] [address] [place], [place] [address]    Culture >=100,000 COLONIES/mL [facility] MORGANII (A)  Final   Report Status 01/31/2022 FINAL  Final   Organism ID, Bacteria MORGANELLA MORGANII (A)  Final      Susceptibility   Morganella morganii - MIC*    AMPICILLIN >=32 RESISTANT Resistant     CEFAZOLIN >=64 RESISTANT Resistant     CIPROFLOXACIN <=0.25 SENSITIVE Sensitive     GENTAMICIN <=1 SENSITIVE Sensitive     IMIPENEM 2 SENSITIVE Sensitive     NITROFURANTOIN 128 RESISTANT Resistant     TRIMETH/SULFA <=20 SENSITIVE Sensitive     AMPICILLIN/SULBACTAM 16 INTERMEDIATE Intermediate     PIP/TAZO <=4 SENSITIVE Sensitive     * >=100,000 COLONIES/mL MORGANELLA MORGANII  Urine Culture     Status: Abnormal   Collection Time: 01/29/22 12:54 PM   Specimen: Urine, Clean Catch  Result Value Ref Range Status   Specimen Description   Final    URINE, CLEAN CATCH Performed at [facility], [place] [address] [place], [place] [address]    Special Requests   Final    NONE Performed at [facility], [place] [address], [place], [place] [address]    Culture >=100,000 COLONIES/mL [facility] MORGANII (A)  Final   Report Status 01/31/2022 FINAL  Final   Organism ID, Bacteria MORGANELLA MORGANII (A)  Final      Susceptibility   Morganella morganii - MIC*    AMPICILLIN >=32 RESISTANT Resistant     CEFAZOLIN >=64 RESISTANT Resistant     CIPROFLOXACIN <=0.25 SENSITIVE Sensitive     GENTAMICIN <=1 SENSITIVE Sensitive     IMIPENEM 2 SENSITIVE Sensitive     NITROFURANTOIN 128 RESISTANT Resistant     TRIMETH/SULFA <=20 SENSITIVE Sensitive     AMPICILLIN/SULBACTAM >=32 RESISTANT Resistant     PIP/TAZO <=4 SENSITIVE Sensitive     * >=100,000 COLONIES/mL [facility] MORGANII         Radiology Studies: No results  found.      Scheduled Meds:  apixaban  5 mg Oral BID   bisoprolol  20 mg Oral Daily   Chlorhexidine Gluconate Cloth  6 each Topical Daily   docusate sodium  100 mg Oral BID   insulin aspart  0-9 Units Subcutaneous TID WC   insulin glargine-yfgn  10 Units Subcutaneous Daily   levothyroxine  112 mcg Oral  Q0600   pantoprazole  40 mg Oral Daily   polyethylene glycol  17 g Oral BID   senna-docusate  2 tablet Oral BID   simvastatin  20 mg Oral Q supper   traZODone  100 mg Oral QHS   Continuous Infusions:  cefTRIAXone (ROCEPHIN)  IV 2 g (02/01/22 1321)     LOS: 6 days    Time spent: 40 minutes.    Irine Seal, MD [facility]   To contact the attending provider between 7A-7P or the covering provider during after hours 7P-7A, please log into the web site [email or web address] and access using universal [place] password for that web site. If you do not have the password, please call the hospital operator.  02/01/2022, 1:29 PM

## 2022-02-01 NOTE — Plan of Care (Signed)
  Problem: Pain Managment: Goal: General experience of comfort will improve Outcome: Progressing   

## 2022-02-02 DIAGNOSIS — N1831 Chronic kidney disease, stage 3a: Secondary | ICD-10-CM | POA: Diagnosis not present

## 2022-02-02 DIAGNOSIS — S72001A Fracture of unspecified part of neck of right femur, initial encounter for closed fracture: Secondary | ICD-10-CM | POA: Diagnosis not present

## 2022-02-02 DIAGNOSIS — N3000 Acute cystitis without hematuria: Secondary | ICD-10-CM | POA: Diagnosis not present

## 2022-02-02 DIAGNOSIS — K219 Gastro-esophageal reflux disease without esophagitis: Secondary | ICD-10-CM | POA: Diagnosis not present

## 2022-02-02 LAB — CBC
HCT: 35.1 % — ABNORMAL LOW (ref 36.0–46.0)
Hemoglobin: 11.5 g/dL — ABNORMAL LOW (ref 12.0–15.0)
MCH: 31.9 pg (ref 26.0–34.0)
MCHC: 32.8 g/dL (ref 30.0–36.0)
MCV: 97.5 fL (ref 80.0–100.0)
Platelets: 260 10*3/uL (ref 150–400)
RBC: 3.6 MIL/uL — ABNORMAL LOW (ref 3.87–5.11)
RDW: 13.6 % (ref 11.5–15.5)
WBC: 8.9 10*3/uL (ref 4.0–10.5)
nRBC: 0 % (ref 0.0–0.2)

## 2022-02-02 LAB — BASIC METABOLIC PANEL
Anion gap: 11 (ref 5–15)
BUN: 28 mg/dL — ABNORMAL HIGH (ref 8–23)
CO2: 25 mmol/L (ref 22–32)
Calcium: 9.2 mg/dL (ref 8.9–10.3)
Chloride: 99 mmol/L (ref 98–111)
Creatinine, Ser: 1.04 mg/dL — ABNORMAL HIGH (ref 0.44–1.00)
GFR, Estimated: 53 mL/min — ABNORMAL LOW (ref >60)
Glucose, Bld: 147 mg/dL — ABNORMAL HIGH (ref 70–99)
Potassium: 4.2 mmol/L (ref 3.5–5.1)
Sodium: 135 mmol/L (ref 135–145)

## 2022-02-02 LAB — GLUCOSE, CAPILLARY
Glucose-Capillary: 131 mg/dL — ABNORMAL HIGH (ref 70–99)
Glucose-Capillary: 183 mg/dL — ABNORMAL HIGH (ref 70–99)
Glucose-Capillary: 142 mg/dL — ABNORMAL HIGH (ref 70–99)
Glucose-Capillary: 152 mg/dL — ABNORMAL HIGH (ref 70–99)

## 2022-02-02 LAB — MAGNESIUM: Magnesium: 2.3 mg/dL (ref 1.7–2.4)

## 2022-02-02 NOTE — Progress Notes (Signed)
PROGRESS NOTE    Miranda Wilkinson  XMI:[id number] DOB: [birth year] DOA: 01/26/2022 PCP: Caryl Bis, MD    Chief Complaint  Patient presents with   Fall    Brief Narrative:  Patient is 86 year old female history of hypertension, hyperlipidemia, type 2 diabetes, A-fib on Eliquis, hypothyroidism, GERD, CKD stage IIIa, hard of hearing who presented after mechanical fall with right hip fracture.  Patient noted to have been on Eliquis prior to admission as such surgical/operative correction held until Eliquis washout done.  Patient subsequently transferred to [facility] for operative correction per Dr. Alvan Dame.   Assessment & Plan:   Principal Problem:   Closed displaced fracture of right femoral neck (HCC) Active Problems:   Acquired hypothyroidism   Essential hypertension, benign   Mixed hyperlipidemia   Type 2 diabetes mellitus with hyperglycemia (HCC)   Hyponatremia   Leukocytosis   Atrial fibrillation, chronic (HCC)   GERD (gastroesophageal reflux disease)   OSA on CPAP   Obesity (BMI 30-39.9)   Chronic kidney disease, stage 3a ([place])   UTI (urinary tract infection)   S/P total right hip arthroplasty  #1 closed displaced fracture right femoral neck -Secondary to mechanical fall. -Patient noted last dose of Eliquis was the p.m. dose of 01/26/2022. -Patient seen by orthopedics and unable to do surgical correction and to Eliquis wash out. -Patient transferred to [facility] for surgical correction per orthopedics, Dr. Alvan Dame which per RN will be performed tomorrow. -Decreased morphine to 1 mg every 4 hours as needed pain as patient noted to have soft blood pressure and increased O2 requirements after morphine dose given early on today. -Discontinued Lovenox preoperatively on postop DVT prophylaxis per orthopedics. -Patient is status post right total hip replacement per orthopedics, Dr. Olin,01/30/2022. -Pain management per orthopedics. -Per orthopedics.  2.   Leukocytosis -Patient noted to have a leukocytosis, low grade temp of 100.4 earlier on in the hospitalization. -Chest x-ray done with no acute infiltrate noted. -Urinalysis concerning for UTI. -Urine cultures > 100,000 Morganella morganii sensitivities. -Fever curve trending down. -Leukocytosis trending down.. -Continue IV Rocephin to finish course.  3.  Morganella Morganii UTI -Urine cultures with >100,000 colonies of Morganella Morganii, sensitive to ciprofloxacin, gentamicin, imipenem, Bactrim, pip-tazo. -Discussed with pharmacy and noted that IV Rocephin will cover UTI as hesitant to place patient on Bactrim due to recent AKI.  -Discontinue Foley catheter today, as per family patient was not on a chronic Foley catheter prior to admission.  4.  Diabetes mellitus type 2 -Hemoglobin A1c 7.6 (01/26/2022) -CBG 131 this morning. -Noted to have received Decadron. -Continue to hold home regimen oral hypoglycemic agents. -Continue Semglee 10 units daily, SSI.  5.  Hypothyroidism -Continue Synthroid.   6.  Hypertension -BP noted to be soft.  Concern morphine may be playing a role with decreased blood pressure. -Decreased morphine to 1 mg every 4 hours as needed. -BP currently stable on current regimen of Zebeta.   -Continue to hold lisinopril likely will not resume on discharge.  -Status post IV albumin x 1.  6.  GERD -PPI.  7.  Hyperlipidemia -Continue statin.  8.  AKI on CKD stage IIIa -Slight bump in creatinine during the hospitalization. -Patient placed on gentle hydration which is subsequently discontinued. -Creatinine trending back down. -Continue to hold lisinopril likely will not resume on discharge.. -Follow.  9.  Class II obesity/OSA -Lifestyle modification. -CPAP nightly -Lifestyle modification.    DVT prophylaxis: Eliquis. Code Status: Full Family Communication: Updated patient, daughter at bedside.  Disposition: SNF  Status is: Inpatient Remains  inpatient appropriate because: Severity of illness   Consultants:  Orthopedics: Dr.Cairns 01/28/2022  Procedures:  CT head 01/26/2022 Chest x-ray 01/29/2022 Right total hip replacement through an anterior approach per orthopedics: Dr. Alvan Dame 01/30/2022  Antimicrobials:  IV Rocephin 01/29/2022>>>> 02/03/2022   Subjective: Sitting up in bed.  Right hip pain improved.  No shortness of breath.  No chest pain.  No abdominal pain.  Daughter at bedside.   Objective: Vitals:   02/01/22 2140 02/02/22 0500 02/02/22 0545 02/02/22 1346  BP:   117/66 (!) 140/69  Pulse: 75  80 76  Resp: '18  17 18  '$ Temp:   (!) 97.5 F (36.4 C) (!) 97.5 F (36.4 C)  TempSrc:   Oral Oral  SpO2: 96%  96% 93%  Weight:  107.2 kg    Height:        Intake/Output Summary (Last 24 hours) at 02/02/2022 1733 Last data filed at 02/02/2022 1001 Gross per 24 hour  Intake 250 ml  Output 2050 ml  Net -1800 ml    Filed Weights   01/31/22 0500 02/01/22 0507 02/02/22 0500  Weight: 109.7 kg 106.4 kg 107.2 kg    Examination:  General exam: Appears calm and comfortable. HOH Respiratory system: Decreased breath sounds in the bases.  No significant crackles noted.  No wheezing.  Fair air movement. Cardiovascular system: Irregularly irregular.  No JVD, no murmurs rubs or gallops.  No lower extremity edema. Gastrointestinal system: Abdomen is soft, nontender, nondistended, positive bowel sounds.  No rebound.  No guarding.  Central nervous system: Alert and oriented. No focal neurological deficits. Extremities: Right hip wound/dressing noted, nontender to palpation.  Skin: No rashes, lesions or ulcers Psychiatry: Judgement and insight appear fair.  Mood & affect appropriate.     Data Reviewed: I have personally reviewed following labs and imaging studies  CBC: Recent Labs  Lab 01/29/22 0509 01/30/22 0505 01/31/22 0449 02/01/22 0527 02/02/22 0511  WBC 10.5 12.1* 11.4* 10.5 8.9  NEUTROABS  --  9.5*  --   --    --   HGB 13.3 12.8 11.5* 11.1* 11.5*  HCT 40.6 39.7 34.0* 34.0* 35.1*  MCV 96.9 97.3 96.6 98.0 97.5  PLT 177 174 158 220 260     Basic Metabolic Panel: Recent Labs  Lab 01/27/22 0849 01/29/22 0509 01/30/22 0505 01/31/22 0449 02/01/22 0527 02/02/22 0511  NA 135 130* 129* 129* 132* 135  K 3.9 4.3 4.2 4.5 4.5 4.2  CL 98 96* 95* 95* 98 99  CO2 '28 26 25 23 25 25  '$ GLUCOSE 182* 188* 164* 233* 193* 147*  BUN 14 25* 38* 32* 29* 28*  CREATININE 1.01* 1.50* 1.76* 1.22* 1.05* 1.04*  CALCIUM 9.7 9.3 9.1 8.6* 9.2 9.2  MG 1.7  --   --   --   --  2.3  PHOS 3.0  --  3.2  --   --   --      GFR: Estimated Creatinine Clearance: 49 mL/min (A) (by C-G formula based on SCr of 1.04 mg/dL (H)).  Liver Function Tests: Recent Labs  Lab 01/27/22 0849 01/30/22 0505  AST 30  --   ALT 28  --   ALKPHOS 53  --   BILITOT 0.8  --   PROT 7.0  --   ALBUMIN 3.5 3.5     CBG: Recent Labs  Lab 02/01/22 1606 02/01/22 2132 02/02/22 0807 02/02/22 1143 02/02/22 1656  GLUCAP 229* 177* 131* 183*  142*      Recent Results (from the past 240 hour(s))  MRSA Next Gen by PCR, Nasal     Status: None   Collection Time: 01/29/22  5:17 AM   Specimen: Nasal Mucosa; Nasal Swab  Result Value Ref Range Status   MRSA by PCR Next Gen NOT DETECTED NOT DETECTED Final    Comment: (NOTE) The GeneXpert MRSA Assay (FDA approved for NASAL specimens only), is one component of a comprehensive MRSA colonization surveillance program. It is not intended to diagnose MRSA infection nor to guide or monitor treatment for MRSA infections. Test performance is not FDA approved in patients less than 69 years old. Performed at [facility], [place] [address], [place], [place] [address]   Urine Culture     Status: Abnormal   Collection Time: 01/29/22 10:20 AM   Specimen: Urine, Catheterized  Result Value Ref Range Status   Specimen Description   Final    URINE, CATHETERIZED Performed at [place] [address] [place], [place] [address]    Special Requests   Final    NONE Performed at [facility], [place] [address] [place], [place] [address]    Culture >=100,000 COLONIES/mL [facility] MORGANII (A)  Final   Report Status 01/31/2022 FINAL  Final   Organism ID, Bacteria MORGANELLA MORGANII (A)  Final      Susceptibility   Morganella morganii - MIC*    AMPICILLIN >=32 RESISTANT Resistant     CEFAZOLIN >=64 RESISTANT Resistant     CIPROFLOXACIN <=0.25 SENSITIVE Sensitive     GENTAMICIN <=1 SENSITIVE Sensitive     IMIPENEM 2 SENSITIVE Sensitive     NITROFURANTOIN 128 RESISTANT Resistant     TRIMETH/SULFA <=20 SENSITIVE Sensitive     AMPICILLIN/SULBACTAM 16 INTERMEDIATE Intermediate     PIP/TAZO <=4 SENSITIVE Sensitive     * >=100,000 COLONIES/mL MORGANELLA MORGANII  Urine Culture     Status: Abnormal   Collection Time: 01/29/22 12:54 PM   Specimen: Urine, Clean Catch  Result Value Ref Range Status   Specimen Description   Final    URINE, CLEAN CATCH Performed at [facility], [place] [address], [place], [place] [address]    Special Requests   Final    NONE Performed at [facility], [place] [address] [place], [place] [address]    Culture >=100,000 COLONIES/mL [facility] MORGANII (A)  Final   Report Status 01/31/2022 FINAL  Final   Organism ID, Bacteria MORGANELLA MORGANII (A)  Final      Susceptibility   Morganella morganii - MIC*    AMPICILLIN >=32 RESISTANT Resistant     CEFAZOLIN >=64 RESISTANT Resistant     CIPROFLOXACIN <=0.25 SENSITIVE Sensitive     GENTAMICIN <=1 SENSITIVE Sensitive     IMIPENEM 2 SENSITIVE Sensitive     NITROFURANTOIN 128 RESISTANT Resistant     TRIMETH/SULFA <=20 SENSITIVE Sensitive     AMPICILLIN/SULBACTAM >=32 RESISTANT Resistant     PIP/TAZO <=4 SENSITIVE Sensitive     * >=100,000 COLONIES/mL [facility] MORGANII         Radiology Studies: No results  found.      Scheduled Meds:  apixaban  5 mg Oral BID   bisoprolol  20 mg Oral Daily   docusate sodium  100 mg Oral BID   insulin aspart  0-9 Units Subcutaneous TID WC   insulin glargine-yfgn  10 Units Subcutaneous Daily   levothyroxine  112 mcg Oral Q0600  pantoprazole  40 mg Oral Daily   polyethylene glycol  17 g Oral BID   senna-docusate  2 tablet Oral BID   simvastatin  20 mg Oral Q supper   traZODone  100 mg Oral QHS   Continuous Infusions:     LOS: 7 days    Time spent: 40 minutes.    Irine Seal, MD [facility]   To contact the attending provider between 7A-7P or the covering provider during after hours 7P-7A, please log into the web site [email or web address] and access using universal [place] password for that web site. If you do not have the password, please call the hospital operator.  02/02/2022, 5:33 PM

## 2022-02-02 NOTE — TOC Initial Note (Signed)
Transition of Care [facility]) - Initial/Assessment Note    Patient Details  Name: Miranda Wilkinson MRN: [id number] Date of Birth: 05/29/1935  Transition of Care [facility]) CM/SW Contact:    Henrietta Dine, RN Phone Number: 02/02/2022, 3:14 PM  Clinical Narrative:                 [facility] consult for SNF; spoke with pt in room; the pt verifies her POC Catalina Antigua (dtr) [phone] and Yevonne Pax (dtr) [phone]; the pt says she lives at home with her dtr; she says she has transportation; she says she does not have [place] service; the pt says she has oxygen and cpap but she is not sure of providing agency; the pt says she has a walker, bilateral hearing aids and shower bars; the pt agrees to SNF; spoke with dtr [name]; she verifies the pt has oxygen and cpap from [place]; she also says there facility preference is [facility]; will complete FL2 and send out for bed offers.  Expected Discharge Plan: Skilled Nursing Facility Barriers to Discharge: Continued Medical Work up   Patient Goals and CMS Choice Patient states their goals for this hospitalization and ongoing recovery are:: SNF   Choice offered to / list presented to : Adult Children  Expected Discharge Plan and Services Expected Discharge Plan: [place] In-house Referral: Clinical Social Work Discharge Planning Services: CM Consult   Living arrangements for the past 2 months: [place]                                      Prior Living Arrangements/Services Living arrangements for the past 2 months: Single Family Home Lives with:: Adult Children Patient language and need for interpreter reviewed:: Yes Do you feel safe going back to the place where you live?: Yes      Need for Family Participation in Patient Care: Yes (Comment) Care giver support system in place?: Yes (comment) Current home services: DME (walker, shower seat, cpap, oxygen) Criminal Activity/Legal Involvement Pertinent to Current  Situation/Hospitalization: No - Comment as needed  Activities of Daily Living Home Assistive Devices/Equipment: Walker (specify type), Cane (specify quad or straight), Oxygen, CPAP ADL Screening (condition at time of admission) Patient's cognitive ability adequate to safely complete daily activities?: Yes Is the patient deaf or have difficulty hearing?: Yes Does the patient have difficulty seeing, even when wearing glasses/contacts?: Yes Does the patient have difficulty concentrating, remembering, or making decisions?: No Patient able to express need for assistance with ADLs?: Yes Does the patient have difficulty dressing or bathing?: No Independently performs ADLs?: Yes (appropriate for developmental age) Does the patient have difficulty walking or climbing stairs?: Yes Weakness of Legs: Both Weakness of Arms/Hands: None  Permission Sought/Granted Permission sought to share information with : Case Manager Permission granted to share information with : Yes, Verbal Permission Granted  Share Information with NAME: Lenor Coffin, RN, CM     Permission granted to share info w Relationship: Catalina Antigua (dtr) [phone] / Yevonne Pax (dtr) [phone]     Emotional Assessment Appearance:: Appears stated age Attitude/Demeanor/Rapport: Gracious Affect (typically observed): Accepting Orientation: : Oriented to Self, Oriented to Place, Oriented to  Time, Oriented to Situation Alcohol / Substance Use: Not Applicable Psych Involvement: No (comment)  Admission diagnosis:  Closed fracture of neck of right femur, initial encounter ([place]) [S72.001A] Closed displaced fracture of right femoral neck ([place]) [S72.001A] Patient Active  Problem List   Diagnosis Date Noted   S/P total right hip arthroplasty 01/30/2022   UTI (urinary tract infection) 01/29/2022   Type 2 diabetes mellitus with hyperglycemia ([place]) 01/27/2022   Hyponatremia 01/27/2022   Leukocytosis 01/27/2022   Atrial  fibrillation, chronic ([place]) 01/27/2022   GERD (gastroesophageal reflux disease) 01/27/2022   OSA on CPAP 01/27/2022   Obesity (BMI 30-39.9) 01/27/2022   Chronic kidney disease, stage 3a ([place]) 01/27/2022   Closed displaced fracture of right femoral neck ([place]) 01/26/2022   Severe sepsis ([place]) 12/08/2019   Diarrhea 12/08/2019   AKI (acute kidney injury) ([place]) 12/08/2019   Atrial fibrillation with rapid ventricular response ([place]) 12/08/2019   Sepsis ([place]) 12/08/2019   Iron deficiency anemia 11/05/2019   Symptomatic anemia 11/05/2019   Atypical atrial flutter ([place]) 11/03/2019   Persistent atrial fibrillation ([place]) 10/14/2019   Secondary hypercoagulable state ([place]) 10/14/2019   COPD GOLD 0/ group B 09/26/2019   Morbid obesity due to excess calories ([place]) comp by hyperlipidemia/ dm/ hbp 09/26/2019   Depression 11/22/2013   Mixed hyperlipidemia 11/22/2013   Allergic rhinitis 11/22/2013   Acquired hypothyroidism 11/08/2013   Diabetes ([place]) 11/08/2013   Essential hypertension, benign 11/08/2013   Closed right ankle fracture 11/03/2013   PCP:  Caryl Bis, MD Pharmacy:   [facility] - [place], [place] - [address] [place] [address] Phone: [phone] Fax: [phone]  [facility] [place], [place] [address] [place] [address] Phone: [phone] Fax: [phone]     Social Determinants of Health (SDOH) Interventions    Readmission Risk Interventions     No data to display

## 2022-02-02 NOTE — NC FL2 (Signed)
[place] LEVEL OF CARE SCREENING TOOL     IDENTIFICATION  Patient Name: Miranda Wilkinson Birthdate: [birth year] Sex: female Admission Date (Current Location): 01/26/2022  [facility] and [place] Number:  Herbalist and Address:  [facility],  [place], [place]      Provider Number: [id number]  Attending Physician Name and Address:  Eugenie Filler, MD  Relative Name and Phone Number:  Catalina Antigua (dtr) [phone]    Current Level of Care: Hospital Recommended Level of Care: Nursing Facility Prior Approval Number:    Date Approved/Denied:   PASRR Number: [id number]  Discharge Plan: SNF    Current Diagnoses: Patient Active Problem List   Diagnosis Date Noted   S/P total right hip arthroplasty 01/30/2022   UTI (urinary tract infection) 01/29/2022   Type 2 diabetes mellitus with hyperglycemia ([place]) 01/27/2022   Hyponatremia 01/27/2022   Leukocytosis 01/27/2022   Atrial fibrillation, chronic ([place]) 01/27/2022   GERD (gastroesophageal reflux disease) 01/27/2022   OSA on CPAP 01/27/2022   Obesity (BMI 30-39.9) 01/27/2022   Chronic kidney disease, stage 3a ([place]) 01/27/2022   Closed displaced fracture of right femoral neck ([place]) 01/26/2022   Severe sepsis ([place]) 12/08/2019   Diarrhea 12/08/2019   AKI (acute kidney injury) ([place]) 12/08/2019   Atrial fibrillation with rapid ventricular response ([place]) 12/08/2019   Sepsis ([place]) 12/08/2019   Iron deficiency anemia 11/05/2019   Symptomatic anemia 11/05/2019   Atypical atrial flutter ([place]) 11/03/2019   Persistent atrial fibrillation ([place]) 10/14/2019   Secondary hypercoagulable state ([place]) 10/14/2019   COPD GOLD 0/ group B 09/26/2019   Morbid obesity due to excess calories ([place]) comp by hyperlipidemia/ dm/ hbp 09/26/2019   Depression 11/22/2013   Mixed hyperlipidemia 11/22/2013   Allergic rhinitis 11/22/2013   Acquired hypothyroidism 11/08/2013   Diabetes ([place]) 11/08/2013    Essential hypertension, benign 11/08/2013   Closed right ankle fracture 11/03/2013    Orientation RESPIRATION BLADDER Height & Weight     Self, Time, Situation, Place  O2 (oxygen and cpap at night) Continent Weight: 107.2 kg Height:  '5\' 5"'$  (165.1 cm)  BEHAVIORAL SYMPTOMS/MOOD NEUROLOGICAL BOWEL NUTRITION STATUS      Continent Diet (regular)  AMBULATORY STATUS COMMUNICATION OF NEEDS Skin   Limited Assist Verbally Surgical wounds, Bruising (surgical incision right hip; ecchymosis to bilateral buttocks and legs)                       Personal Care Assistance Level of Assistance  Bathing, Feeding, Dressing Bathing Assistance: Limited assistance Feeding assistance: Independent Dressing Assistance: Limited assistance     Functional Limitations Info  Sight, Hearing, Speech Sight Info: Impaired Hearing Info: Impaired Speech Info: Adequate    SPECIAL CARE FACTORS FREQUENCY  PT (By licensed PT), OT (By licensed OT)     PT Frequency: 5x/weel\k OT Frequency: 5x/week            Contractures Contractures Info: Not present    Additional Factors Info  Code Status, Allergies Code Status Info: full Allergies Info: NKDA           Current Medications (02/02/2022):  This is the current hospital active medication list Current Facility-Administered Medications  Medication Dose Route Frequency Provider Last Rate Last Admin   acetaminophen (TYLENOL) tablet 325-650 mg  325-650 mg Oral Q6H PRN Irving Copas, PA-C       apixaban (ELIQUIS) tablet 5 mg  5 mg Oral BID Irving Copas, PA-C  5 mg at 02/02/22 0948   bisacodyl (DULCOLAX) suppository 10 mg  10 mg Rectal Daily PRN Irving Copas, PA-C       bisoprolol (ZEBETA) tablet 20 mg  20 mg Oral Daily Irving Copas, PA-C   20 mg at 02/02/22 0946   diphenhydrAMINE (BENADRYL) 12.5 MG/5ML elixir 12.5-25 mg  12.5-25 mg Oral Q4H PRN Irving Copas, PA-C       docusate sodium (COLACE) capsule 100 mg  100 mg Oral BID  Irving Copas, PA-C   100 mg at 02/01/22 2208   HYDROcodone-acetaminophen (NORCO) 7.5-325 MG per tablet 1-2 tablet  1-2 tablet Oral Q4H PRN Irving Copas, PA-C   2 tablet at 02/02/22 1604   HYDROcodone-acetaminophen (NORCO/VICODIN) 5-325 MG per tablet 1-2 tablet  1-2 tablet Oral Q4H PRN Irving Copas, PA-C   1 tablet at 01/31/22 1515   insulin aspart (novoLOG) injection 0-9 Units  0-9 Units Subcutaneous TID WC Irving Copas, PA-C   2 Units at 02/02/22 1155   insulin glargine-yfgn (SEMGLEE) injection 10 Units  10 Units Subcutaneous Daily Eugenie Filler, MD   10 Units at 02/02/22 0947   levothyroxine (SYNTHROID) tablet 112 mcg  112 mcg Oral Q0600 Irving Copas, PA-C   112 mcg at 02/02/22 0600   melatonin tablet 3-6 mg  3-6 mg Oral QHS PRN Irving Copas, PA-C   6 mg at 01/27/22 1948   menthol-cetylpyridinium (CEPACOL) lozenge 3 mg  1 lozenge Oral PRN Irving Copas, PA-C       Or   phenol (CHLORASEPTIC) mouth spray 1 spray  1 spray Mouth/Throat PRN Irving Copas, PA-C       methocarbamol (ROBAXIN) tablet 500 mg  500 mg Oral Q6H PRN Irving Copas, PA-C       metoCLOPramide (REGLAN) tablet 5-10 mg  5-10 mg Oral Q8H PRN Irving Copas, PA-C       Or   metoCLOPramide (REGLAN) injection 5-10 mg  5-10 mg Intravenous Q8H PRN Irving Copas, PA-C       morphine (PF) 2 MG/ML injection 0.5-1 mg  0.5-1 mg Intravenous Q2H PRN Irving Copas, PA-C       ondansetron [facility]) tablet 4 mg  4 mg Oral Q6H PRN Irving Copas, PA-C       Or   ondansetron (ZOFRAN) injection 4 mg  4 mg Intravenous Q6H PRN Costella Hatcher R, PA-C       pantoprazole (PROTONIX) EC tablet 40 mg  40 mg Oral Daily Irving Copas, PA-C   40 mg at 02/02/22 0946   polyethylene glycol (MIRALAX / GLYCOLAX) packet 17 g  17 g Oral BID Irving Copas, PA-C   17 g at 02/01/22 2209   polyvinyl alcohol (LIQUIFILM TEARS) 1.4 % ophthalmic solution 1 drop  1 drop Both Eyes PRN Irving Copas, PA-C        senna-docusate (Senokot-S) tablet 2 tablet  2 tablet Oral BID Irving Copas, PA-C   2 tablet at 02/01/22 2208   simvastatin (ZOCOR) tablet 20 mg  20 mg Oral Q supper Irving Copas, PA-C   20 mg at 02/02/22 1604   traZODone (DESYREL) tablet 100 mg  100 mg Oral QHS Irving Copas, PA-C   100 mg at 02/01/22 2208   witch hazel-glycerin (TUCKS) pad   Topical PRN Eugenie Filler, MD         Discharge Medications: Please see discharge summary for  a list of discharge medications.  Relevant Imaging Results:  Relevant Lab Results:   Additional Information SSN [id number]  Henrietta Dine, RN

## 2022-02-03 ENCOUNTER — Encounter (HOSPITAL_COMMUNITY): Payer: Self-pay | Admitting: Orthopedic Surgery

## 2022-02-03 DIAGNOSIS — N1831 Chronic kidney disease, stage 3a: Secondary | ICD-10-CM | POA: Diagnosis not present

## 2022-02-03 DIAGNOSIS — K219 Gastro-esophageal reflux disease without esophagitis: Secondary | ICD-10-CM | POA: Diagnosis not present

## 2022-02-03 DIAGNOSIS — S72001A Fracture of unspecified part of neck of right femur, initial encounter for closed fracture: Secondary | ICD-10-CM | POA: Diagnosis not present

## 2022-02-03 DIAGNOSIS — N3000 Acute cystitis without hematuria: Secondary | ICD-10-CM | POA: Diagnosis not present

## 2022-02-03 LAB — BASIC METABOLIC PANEL
Anion gap: 10 (ref 5–15)
BUN: 22 mg/dL (ref 8–23)
CO2: 25 mmol/L (ref 22–32)
Calcium: 9.4 mg/dL (ref 8.9–10.3)
Chloride: 100 mmol/L (ref 98–111)
Creatinine, Ser: 1.1 mg/dL — ABNORMAL HIGH (ref 0.44–1.00)
GFR, Estimated: 50 mL/min — ABNORMAL LOW (ref >60)
Glucose, Bld: 157 mg/dL — ABNORMAL HIGH (ref 70–99)
Potassium: 4.8 mmol/L (ref 3.5–5.1)
Sodium: 135 mmol/L (ref 135–145)

## 2022-02-03 LAB — CBC
HCT: 38.1 % (ref 36.0–46.0)
Hemoglobin: 12.2 g/dL (ref 12.0–15.0)
MCH: 31.6 pg (ref 26.0–34.0)
MCHC: 32 g/dL (ref 30.0–36.0)
MCV: 98.7 fL (ref 80.0–100.0)
Platelets: 295 10*3/uL (ref 150–400)
RBC: 3.86 MIL/uL — ABNORMAL LOW (ref 3.87–5.11)
RDW: 13.8 % (ref 11.5–15.5)
WBC: 10.8 10*3/uL — ABNORMAL HIGH (ref 4.0–10.5)
nRBC: 0 % (ref 0.0–0.2)

## 2022-02-03 LAB — GLUCOSE, CAPILLARY
Glucose-Capillary: 155 mg/dL — ABNORMAL HIGH (ref 70–99)
Glucose-Capillary: 272 mg/dL — ABNORMAL HIGH (ref 70–99)
Glucose-Capillary: 119 mg/dL — ABNORMAL HIGH (ref 70–99)
Glucose-Capillary: 188 mg/dL — ABNORMAL HIGH (ref 70–99)

## 2022-02-03 NOTE — Progress Notes (Signed)
PROGRESS NOTE    Miranda Wilkinson  RCB:[id number] DOB: 08/28/1935 DOA: 01/26/2022 PCP: Caryl Bis, MD    Chief Complaint  Patient presents with   Fall    Brief Narrative:  Patient is 86 year old female history of hypertension, hyperlipidemia, type 2 diabetes, A-fib on Eliquis, hypothyroidism, GERD, CKD stage IIIa, hard of hearing who presented after mechanical fall with right hip fracture.  Patient noted to have been on Eliquis prior to admission as such surgical/operative correction held until Eliquis washout done.  Patient subsequently transferred to [facility] for operative correction per Dr. Alvan Dame.   Assessment & Plan:   Principal Problem:   Closed displaced fracture of right femoral neck (HCC) Active Problems:   Acquired hypothyroidism   Essential hypertension, benign   Mixed hyperlipidemia   Type 2 diabetes mellitus with hyperglycemia (HCC)   Hyponatremia   Leukocytosis   Atrial fibrillation, chronic (HCC)   GERD (gastroesophageal reflux disease)   OSA on CPAP   Obesity (BMI 30-39.9)   Chronic kidney disease, stage 3a ([place])   UTI (urinary tract infection)   S/P total right hip arthroplasty  #1 closed displaced fracture right femoral neck -Secondary to mechanical fall. -Patient noted last dose of Eliquis was the p.m. dose of 01/26/2022. -Patient seen by orthopedics and unable to do surgical correction and to Eliquis wash out. -Patient transferred to [facility] for surgical correction per orthopedics, Dr. Alvan Dame which per RN will be performed tomorrow. -Decreased morphine to 1 mg every 4 hours as needed pain as patient noted to have soft blood pressure and increased O2 requirements after morphine dose given early on today. -Discontinued Lovenox preoperatively on postop DVT prophylaxis per orthopedics. -Patient is status post right total hip replacement per orthopedics, Dr. Olin,01/30/2022. -Pain management per orthopedics. -Per orthopedics.  2.   Leukocytosis -Patient noted to have a leukocytosis, low grade temp of 100.4 earlier on in the hospitalization. -Chest x-ray done with no acute infiltrate noted. -Urinalysis concerning for UTI. -Urine cultures > 100,000 Morganella morganii sensitivities. -Fever curve trending down. -Leukocytosis trending down.. - IV Rocephin to finish course.  3.  Morganella Morganii UTI -Urine cultures with >100,000 colonies of Morganella Morganii, sensitive to ciprofloxacin, gentamicin, imipenem, Bactrim, pip-tazo. -Discussed with pharmacy and noted that IV Rocephin will cover UTI as hesitant to place patient on Bactrim due to recent AKI.  -Discontinued Foley catheter 02/02/2022, as per family patient was not on a chronic Foley catheter prior to admission. -Discontinue Rocephin after course completed.  4.  Diabetes mellitus type 2 -Hemoglobin A1c 7.6 (01/26/2022) -CBG 155 this morning. -Noted to have received Decadron. -Continue to hold home regimen oral hypoglycemic agents. -Continue Semglee 10 units daily, SSI.  5.  Hypothyroidism -Synthroid.  6.  Hypertension -BP noted to be soft.  Concern morphine may be playing a role with decreased blood pressure. -Decreased morphine to 1 mg every 4 hours as needed. -BP currently stable on current regimen of Zebeta.   -Continue to hold lisinopril likely will not resume on discharge.  -Status post IV albumin x 1.  6.  GERD -Continue PPI.  7.  Hyperlipidemia -Statin.  8.  AKI on CKD stage IIIa -Slight bump in creatinine during the hospitalization. -Patient placed on gentle hydration which is subsequently discontinued. -Creatinine trending back down. -Continue to hold lisinopril likely will not resume on discharge.. -Follow.  9.  Class II obesity/OSA -Lifestyle modification. -CPAP nightly -Lifestyle modification.    DVT prophylaxis: Eliquis. Code Status: Full Family Communication: Updated patient,  daughter at bedside.   Disposition: SNF  hopefully tomorrow.  Status is: Inpatient Remains inpatient appropriate because: Severity of illness   Consultants:  Orthopedics: Dr.Cairns 01/28/2022  Procedures:  CT head 01/26/2022 Chest x-ray 01/29/2022 Right total hip replacement through an anterior approach per orthopedics: Dr. Alvan Dame 01/30/2022  Antimicrobials:  IV Rocephin 01/29/2022>>>> 02/03/2022   Subjective: Sitting up in bed.  No chest pain.  No shortness of breath.  No abdominal pain.  Complaining of discomfort while sitting in the chair for hours.  Daughter at bedside.  Overall feeling well.  Good urine output post Foley catheter removal.  Objective: Vitals:   02/03/22 0356 02/03/22 0500 02/03/22 0954 02/03/22 1254  BP: (!) 136/56  (!) 151/74 (!) 155/74  Pulse: 81  91 80  Resp: 18     Temp: 98.6 F (37 C)   (!) 97.4 F (36.3 C)  TempSrc: Oral   Axillary  SpO2: 94%  91% 99%  Weight:  106.9 kg    Height:        Intake/Output Summary (Last 24 hours) at 02/03/2022 1255 Last data filed at 02/03/2022 1254 Gross per 24 hour  Intake 120 ml  Output 2350 ml  Net -2230 ml    Filed Weights   02/01/22 0507 02/02/22 0500 02/03/22 0500  Weight: 106.4 kg 107.2 kg 106.9 kg    Examination:  General exam: Appears calm and comfortable. HOH Respiratory system: Some decreased breath sounds in the bases otherwise clear.  No crackles.  No wheezing.  Fair air movement.  Speaking in full sentences.  Cardiovascular system: Irregularly irregular.  No JVD, no murmurs rubs or gallops.  No lower extremity edema.  Gastrointestinal system: Abdomen is soft, nontender, nondistended, positive bowel sounds.  No rebound.  No guarding.  Central nervous system: Alert and oriented. No focal neurological deficits. Extremities: Right hip wound/dressing noted, nontender to palpation.  Skin: No rashes, lesions or ulcers Psychiatry: Judgement and insight appear fair.  Mood & affect appropriate.     Data Reviewed: I have personally  reviewed following labs and imaging studies  CBC: Recent Labs  Lab 01/30/22 0505 01/31/22 0449 02/01/22 0527 02/02/22 0511 02/03/22 0406  WBC 12.1* 11.4* 10.5 8.9 10.8*  NEUTROABS 9.5*  --   --   --   --   HGB 12.8 11.5* 11.1* 11.5* 12.2  HCT 39.7 34.0* 34.0* 35.1* 38.1  MCV 97.3 96.6 98.0 97.5 98.7  PLT 174 158 220 260 295     Basic Metabolic Panel: Recent Labs  Lab 01/30/22 0505 01/31/22 0449 02/01/22 0527 02/02/22 0511 02/03/22 0406  NA 129* 129* 132* 135 135  K 4.2 4.5 4.5 4.2 4.8  CL 95* 95* 98 99 100  CO2 '25 23 25 25 25  '$ GLUCOSE 164* 233* 193* 147* 157*  BUN 38* 32* 29* 28* 22  CREATININE 1.76* 1.22* 1.05* 1.04* 1.10*  CALCIUM 9.1 8.6* 9.2 9.2 9.4  MG  --   --   --  2.3  --   PHOS 3.2  --   --   --   --      GFR: Estimated Creatinine Clearance: 46.3 mL/min (A) (by C-G formula based on SCr of 1.1 mg/dL (H)).  Liver Function Tests: Recent Labs  Lab 01/30/22 0505  ALBUMIN 3.5     CBG: Recent Labs  Lab 02/02/22 1143 02/02/22 1656 02/02/22 2127 02/03/22 0741 02/03/22 1137  GLUCAP 183* 142* 152* 155* 272*      Recent Results (from the past 240  hour(s))  MRSA Next Gen by PCR, Nasal     Status: None   Collection Time: 01/29/22  5:17 AM   Specimen: Nasal Mucosa; Nasal Swab  Result Value Ref Range Status   MRSA by PCR Next Gen NOT DETECTED NOT DETECTED Final    Comment: (NOTE) The GeneXpert MRSA Assay (FDA approved for NASAL specimens only), is one component of a comprehensive MRSA colonization surveillance program. It is not intended to diagnose MRSA infection nor to guide or monitor treatment for MRSA infections. Test performance is not FDA approved in patients less than 40 years old. Performed at [facility], [place] [address], [place], [place] [address]   Urine Culture     Status: Abnormal   Collection Time: 01/29/22 10:20 AM   Specimen: Urine, Catheterized  Result Value Ref Range Status   Specimen Description   Final     URINE, CATHETERIZED Performed at [place] [address], [place], [place] [address]    Special Requests   Final    NONE Performed at [facility], [place] [address], [place], [place] [address]    Culture >=100,000 COLONIES/mL [facility] MORGANII (A)  Final   Report Status 01/31/2022 FINAL  Final   Organism ID, Bacteria MORGANELLA MORGANII (A)  Final      Susceptibility   Morganella morganii - MIC*    AMPICILLIN >=32 RESISTANT Resistant     CEFAZOLIN >=64 RESISTANT Resistant     CIPROFLOXACIN <=0.25 SENSITIVE Sensitive     GENTAMICIN <=1 SENSITIVE Sensitive     IMIPENEM 2 SENSITIVE Sensitive     NITROFURANTOIN 128 RESISTANT Resistant     TRIMETH/SULFA <=20 SENSITIVE Sensitive     AMPICILLIN/SULBACTAM 16 INTERMEDIATE Intermediate     PIP/TAZO <=4 SENSITIVE Sensitive     * >=100,000 COLONIES/mL MORGANELLA MORGANII  Urine Culture     Status: Abnormal   Collection Time: 01/29/22 12:54 PM   Specimen: Urine, Clean Catch  Result Value Ref Range Status   Specimen Description   Final    URINE, CLEAN CATCH Performed at [facility], [place] [address] [place], [place] [address]    Special Requests   Final    NONE Performed at [facility], [place] [address], [place],  [address]    Culture >=100,000 COLONIES/mL [facility] MORGANII (A)  Final   Report Status 01/31/2022 FINAL  Final   Organism ID, Bacteria MORGANELLA MORGANII (A)  Final      Susceptibility   Morganella morganii - MIC*    AMPICILLIN >=32 RESISTANT Resistant     CEFAZOLIN >=64 RESISTANT Resistant     CIPROFLOXACIN <=0.25 SENSITIVE Sensitive     GENTAMICIN <=1 SENSITIVE Sensitive     IMIPENEM 2 SENSITIVE Sensitive     NITROFURANTOIN 128 RESISTANT Resistant     TRIMETH/SULFA <=20 SENSITIVE Sensitive     AMPICILLIN/SULBACTAM >=32 RESISTANT Resistant     PIP/TAZO <=4 SENSITIVE Sensitive     * >=100,000 COLONIES/mL [facility] MORGANII          Radiology Studies: No results found.      Scheduled Meds:  apixaban  5 mg Oral BID   bisoprolol  20 mg Oral Daily   docusate sodium  100 mg Oral BID   insulin aspart  0-9 Units Subcutaneous TID WC   insulin glargine-yfgn  10 Units Subcutaneous Daily   levothyroxine  112 mcg Oral Q0600   pantoprazole  40 mg Oral Daily   polyethylene glycol  17 g Oral BID   senna-docusate  2 tablet Oral BID   simvastatin  20 mg Oral Q supper   traZODone  100 mg Oral QHS   Continuous Infusions:     LOS: 8 days    Time spent: 40 minutes.    Irine Seal, MD [facility]   To contact the attending provider between 7A-7P or the covering provider during after hours 7P-7A, please log into the web site [email or web address] and access using universal [place] password for that web site. If you do not have the password, please call the hospital operator.  02/03/2022, 12:55 PM

## 2022-02-03 NOTE — Care Management Important Message (Signed)
Important Message  Patient Details IM Letter given. Name: RILEI KRAVITZ MRN: [id number] Date of Birth: 1935-08-14   Medicare Important Message Given:  Yes     Kerin Salen 02/03/2022, 2:53 PM

## 2022-02-03 NOTE — Progress Notes (Signed)
Physical Therapy Treatment Patient Details Name: Miranda Wilkinson MRN: [id number] DOB: Sep 06, 1935 Today's Date: 02/03/2022   History of Present Illness 86 year old female history of hypertension, hyperlipidemia, type 2 diabetes, A-fib on Eliquis, hypothyroidism, GERD, CKD stage IIIa, OSA, hard of hearing who presented after mechanical fall with right hip fracture.  Pt s/p Right THA anterior approach on 01/30/22.    PT Comments    Pt agreeable to mobilize however only tolerated 5 ft of ambulation due to fatigue.  Pt's SPO2 also dropped to 86% on room air with ambulation. Pt repositioned to comfort in recliner.  Continue to recommend SNF upon d/c.    Recommendations for follow up therapy are one component of a multi-disciplinary discharge planning process, led by the attending physician.  Recommendations may be updated based on patient status, additional functional criteria and insurance authorization.  Follow Up Recommendations  Skilled nursing-short term rehab (<3 hours/day) Can patient physically be transported by private vehicle: No   Assistance Recommended at Discharge Frequent or constant Supervision/Assistance  Patient can return home with the following A lot of help with walking and/or transfers;A lot of help with bathing/dressing/bathroom;Help with stairs or ramp for entrance   Equipment Recommendations  Rolling walker (2 wheels)    Recommendations for Other Services       Precautions / Restrictions Precautions Precautions: Fall Precaution Comments: monitor sats Restrictions RLE Weight Bearing: Weight bearing as tolerated     Mobility  Bed Mobility Overal bed mobility: Needs Assistance Bed Mobility: Supine to Sit     Supine to sit: Min assist, HOB elevated     General bed mobility comments: assist for trunk upright    Transfers Overall transfer level: Needs assistance Equipment used: Rolling walker (2 wheels) Transfers: Sit to/from Stand Sit to Stand: Min  assist, +2 safety/equipment           General transfer comment: verbal cues for hand placement and weight shifting    Ambulation/Gait Ambulation/Gait assistance: Min assist, +2 safety/equipment Gait Distance (Feet): 5 Feet Assistive device: Rolling walker (2 wheels) Gait Pattern/deviations: Step-to pattern, Decreased stance time - right, Antalgic       General Gait Details: verbal cues for sequence, RW positioning, step length, posture; only tolerated short distance due to fatigue, required recliner; SPO2 dropped to 86% on room air so reapplied O2 [place] (improved to 91% prior to therapist leaving room)   Stairs             Wheelchair Mobility    Modified Rankin (Stroke Patients Only)       Balance                                            Cognition Arousal/Alertness: Awake/alert Behavior During Therapy: WFL for tasks assessed/performed Overall Cognitive Status: Difficult to assess                                 General Comments: follows commands and cues, very HOH even with hearing aids        Exercises      General Comments        Pertinent Vitals/Pain Pain Assessment Pain Assessment: Faces Faces Pain Scale: Hurts little more Pain Location: right hip Pain Descriptors / Indicators: Grimacing, Sore Pain Intervention(s): Repositioned, Monitored during session    Home Living  Prior Function            PT Goals (current goals can now be found in the care plan section) Progress towards PT goals: Progressing toward goals    Frequency    Min 3X/week      PT Plan Current plan remains appropriate    Co-evaluation              AM-PAC PT "6 Clicks" Mobility   Outcome Measure  Help needed turning from your back to your side while in a flat bed without using bedrails?: A Little Help needed moving from lying on your back to sitting on the side of a flat bed without using  bedrails?: A Little Help needed moving to and from a bed to a chair (including a wheelchair)?: A Lot Help needed standing up from a chair using your arms (e.g., wheelchair or bedside chair)?: A Lot Help needed to walk in hospital room?: A Lot Help needed climbing 3-5 steps with a railing? : Total 6 Click Score: 13    End of Session Equipment Utilized During Treatment: Gait belt;Oxygen Activity Tolerance: Patient limited by fatigue Patient left: with call bell/phone within reach;in chair;with chair alarm set Nurse Communication: Mobility status PT Visit Diagnosis: Difficulty in walking, not elsewhere classified (R26.2)     Time: 7096-2836 PT Time Calculation (min) (ACUTE ONLY): 14 min  Charges:  $Gait Training: 8-22 mins                     Arlyce Dice, DPT Physical Therapist Acute Rehabilitation Services Preferred contact method: Secure Chat Weekend Pager Only: [phone] Office: [place] 02/03/2022, 1:25 PM

## 2022-02-03 NOTE — Progress Notes (Addendum)
Physical Therapy Treatment Patient Details Name: Miranda Wilkinson MRN: [id number] DOB: [birth year] Today's Date: 02/03/2022   History of Present Illness 86 year old female history of hypertension, hyperlipidemia, type 2 diabetes, A-fib on Eliquis, hypothyroidism, GERD, CKD stage IIIa, OSA, hard of hearing who presented after mechanical fall with right hip fracture.  Pt s/p Right THA anterior approach on 01/30/22.    PT Comments    POD x 4 pm session. Patient found on 2L of oxygen at 98% with daughter present prior to session. C/o of 4/10 pain in R hip at rest, decreased to 2/10 at end of session. Monitored SpO2 levels on RA, WNL throughout session with lowest being 90% when sitting EOB. C/o of dizziness when EOB but quickly recovered. Patient required mod assist to guide trunk and RLE off of bed and max assist to guide trunk/RLE back on bed, used/educated pt/daughter on modified safety belt for R LE, also used bed pad to scoot patient to [facility] and to sit EOB; Min assist +2 for safety needed for step pivot transfer, min assist for safety needed to stand from elevated bed, and mod assist to stand from [facility];  Min assist required for 4 right lateral steps to [facility]. Repeated cues needed due to [facility]. Pt will need ST Rehab at SNF to address mobility and functional decline prior to safely returning home.   Recommendations for follow up therapy are one component of a multi-disciplinary discharge planning process, led by the attending physician.  Recommendations may be updated based on patient status, additional functional criteria and insurance authorization.  Follow Up Recommendations  Skilled nursing-short term rehab (<3 hours/day) Can patient physically be transported by private vehicle: No   Assistance Recommended at Discharge Frequent or constant Supervision/Assistance  Patient can return home with the following A lot of help with walking and/or transfers;A lot of help with bathing/dressing/bathroom;Help with  stairs or ramp for entrance   Equipment Recommendations  Rolling walker (2 wheels)    Recommendations for Other Services       Precautions / Restrictions Precautions Precautions: Fall Precaution Comments: monitor sats Restrictions Weight Bearing Restrictions: No RLE Weight Bearing: Weight bearing as tolerated     Mobility  Bed Mobility Overal bed mobility: Needs Assistance Bed Mobility: Supine to Sit     Supine to sit: HOB elevated, Mod assist Sit to supine: Max assist, Mod assist   General bed mobility comments: mod assist to guide trunk and RLE off of bed and max assist to put guide back on bed.    Transfers Overall transfer level: Needs assistance Equipment used: Rolling walker (2 wheels) Transfers: Sit to/from Stand Sit to Stand: Min assist, +2 safety/equipment, Mod assist   Step pivot transfers: Min assist, +2 safety/equipment       General transfer comment: Min assist needed for step pivot transfer, min assist needed to stand from elevated bed, and mod assist to stand from [facility].    Ambulation/Gait Ambulation/Gait assistance: Min assist, +2 safety/equipment Gait Distance (Feet): 2 Feet Assistive device: Rolling walker (2 wheels)         General Gait Details: Min assist required for 4 right lateral steps to [facility].   Stairs             Wheelchair Mobility    Modified Rankin (Stroke Patients Only)       Balance  Cognition Arousal/Alertness: Awake/alert Behavior During Therapy: WFL for tasks assessed/performed Overall Cognitive Status: Difficult to assess                                 General Comments: follows commands and cues, very HOH even with hearing aids        Exercises      General Comments        Pertinent Vitals/Pain Pain Assessment Pain Assessment: 0-10 Pain Score: 4  Pain Location: right hip Pain Descriptors / Indicators: Grimacing,  Sore Pain Intervention(s): Monitored during session, Limited activity within patient's tolerance, Ice applied    Home Living                          Prior Function            PT Goals (current goals can now be found in the care plan section) Acute Rehab PT Goals PT Goal Formulation: With patient/family Time For Goal Achievement: 02/14/22 Potential to Achieve Goals: Good Progress towards PT goals: Progressing toward goals    Frequency    Min 3X/week      PT Plan Current plan remains appropriate    Co-evaluation              AM-PAC PT "6 Clicks" Mobility   Outcome Measure  Help needed turning from your back to your side while in a flat bed without using bedrails?: A Little Help needed moving from lying on your back to sitting on the side of a flat bed without using bedrails?: A Little Help needed moving to and from a bed to a chair (including a wheelchair)?: A Lot Help needed standing up from a chair using your arms (e.g., wheelchair or bedside chair)?: A Little Help needed to walk in hospital room?: A Lot Help needed climbing 3-5 steps with a railing? : Total 6 Click Score: 14    End of Session Equipment Utilized During Treatment: Gait belt Activity Tolerance: Patient limited by fatigue Patient left: with call bell/phone within reach;with bed alarm set;in bed;with family/visitor present Nurse Communication: Mobility status PT Visit Diagnosis: Difficulty in walking, not elsewhere classified (R26.2)     Time: 1435-1450 PT Time Calculation (min) (ACUTE ONLY): 15 min  Charges:  $Gait Training: 8-22 mins $Therapeutic Activity: 8-22 mins                       Aryel Edelen 02/03/2022, 3:25 PM

## 2022-02-04 DIAGNOSIS — K219 Gastro-esophageal reflux disease without esophagitis: Secondary | ICD-10-CM | POA: Diagnosis not present

## 2022-02-04 DIAGNOSIS — E039 Hypothyroidism, unspecified: Secondary | ICD-10-CM | POA: Diagnosis not present

## 2022-02-04 DIAGNOSIS — I10 Essential (primary) hypertension: Secondary | ICD-10-CM | POA: Diagnosis not present

## 2022-02-04 DIAGNOSIS — S72001A Fracture of unspecified part of neck of right femur, initial encounter for closed fracture: Secondary | ICD-10-CM | POA: Diagnosis not present

## 2022-02-04 LAB — GLUCOSE, CAPILLARY
Glucose-Capillary: 148 mg/dL — ABNORMAL HIGH (ref 70–99)
Glucose-Capillary: 176 mg/dL — ABNORMAL HIGH (ref 70–99)
Glucose-Capillary: 132 mg/dL — ABNORMAL HIGH (ref 70–99)
Glucose-Capillary: 182 mg/dL — ABNORMAL HIGH (ref 70–99)

## 2022-02-04 LAB — BASIC METABOLIC PANEL
Anion gap: 10 (ref 5–15)
BUN: 18 mg/dL (ref 8–23)
CO2: 26 mmol/L (ref 22–32)
Calcium: 9.5 mg/dL (ref 8.9–10.3)
Chloride: 100 mmol/L (ref 98–111)
Creatinine, Ser: 0.8 mg/dL (ref 0.44–1.00)
GFR, Estimated: >60 mL/min (ref >60)
Glucose, Bld: 146 mg/dL — ABNORMAL HIGH (ref 70–99)
Potassium: 4.7 mmol/L (ref 3.5–5.1)
Sodium: 136 mmol/L (ref 135–145)

## 2022-02-04 LAB — CBC
HCT: 37.6 % (ref 36.0–46.0)
Hemoglobin: 12.5 g/dL (ref 12.0–15.0)
MCH: 31.8 pg (ref 26.0–34.0)
MCHC: 33.2 g/dL (ref 30.0–36.0)
MCV: 95.7 fL (ref 80.0–100.0)
Platelets: 236 10*3/uL (ref 150–400)
RBC: 3.93 MIL/uL (ref 3.87–5.11)
RDW: 13.8 % (ref 11.5–15.5)
WBC: 11.3 10*3/uL — ABNORMAL HIGH (ref 4.0–10.5)
nRBC: 0.2 % (ref 0.0–0.2)

## 2022-02-04 MED ORDER — HYDROCODONE-ACETAMINOPHEN 5-325 MG PO TABS: 1.0000 | ORAL_TABLET | Freq: Four times a day (QID) | ORAL | 0 refills | Status: DC | PRN

## 2022-02-04 MED ORDER — METHOCARBAMOL 500 MG PO TABS: 500.0000 mg | ORAL_TABLET | Freq: Four times a day (QID) | ORAL | 0 refills | Status: DC | PRN

## 2022-02-04 MED ORDER — POLYETHYLENE GLYCOL 3350 17 G PO PACK: 17.0000 g | PACK | Freq: Two times a day (BID) | ORAL | 0 refills | Status: DC

## 2022-02-04 MED ORDER — WITCH HAZEL-GLYCERIN EX PADS: MEDICATED_PAD | CUTANEOUS | 12 refills | Status: DC | PRN

## 2022-02-04 MED ORDER — POLYVINYL ALCOHOL 1.4 % OP SOLN: 1.0000 [drp] | OPHTHALMIC | 0 refills | Status: DC | PRN

## 2022-02-04 MED ORDER — SENNOSIDES-DOCUSATE SODIUM 8.6-50 MG PO TABS: 2.0000 | ORAL_TABLET | Freq: Two times a day (BID) | ORAL | Status: DC

## 2022-02-04 MED ORDER — TRAZODONE HCL 50 MG PO TABS: 100.0000 mg | ORAL_TABLET | Freq: Every evening | ORAL | 0 refills | Status: DC | PRN

## 2022-02-04 NOTE — TOC Progression Note (Signed)
Transition of Care [facility]) - Progression Note    Patient Details  Name: Miranda Wilkinson MRN: [id number] Date of Birth: 09-Oct-1935  Transition of Care [facility]) CM/SW Contact  Joaquin Courts, RN Phone Number: 02/04/2022, 4:24 PM  Clinical Narrative:    [facility] faxed out for SNF bed.   Expected Discharge Plan: [place] Barriers to Discharge: Continued Medical Work up  Expected Discharge Plan and Services Expected Discharge Plan: [place] In-house Referral: Clinical Social Work Discharge Planning Services: CM Consult   Living arrangements for the past 2 months: Single Family Home Expected Discharge Date: 02/04/22                                     Social Determinants of Health (SDOH) Interventions    Readmission Risk Interventions     No data to display

## 2022-02-04 NOTE — Progress Notes (Addendum)
Physical Therapy Treatment Patient Details Name: Miranda Wilkinson MRN: [id number] DOB: [birth year] Today's Date: 02/04/2022   History of Present Illness 86 year old female history of hypertension, hyperlipidemia, type 2 diabetes, A-fib on Eliquis, hypothyroidism, GERD, CKD stage IIIa, OSA, hard of hearing who presented after mechanical fall with right hip fracture.  Pt s/p Right THA anterior approach on 01/30/22.    PT Comments    POD x 5 pm session. Daughter present during session. Pt more alert this session. Pt denied any pain at rest, c/o of 3/10 pain during initial standing, and increased to 5/10 during ambulation. Required mod assist for bed mobility to guide trunk and RLE off of bed; Min assist needed for transfers for safety to stand from elevated bed; Min assist needed for safety to ambulate 6 ft to the door. Unable to ambulate further due to fatigue/increase in pain. Returned to [employer]. Average SpO2 during activity was 93%. Pt will need ST Rehab at SNF to address mobility and functional decline prior to safely returning home.     Recommendations for follow up therapy are one component of a multi-disciplinary discharge planning process, led by the attending physician.  Recommendations may be updated based on patient status, additional functional criteria and insurance authorization.  Follow Up Recommendations  Skilled nursing-short term rehab (<3 hours/day) Can patient physically be transported by private vehicle: No   Assistance Recommended at Discharge Frequent or constant Supervision/Assistance  Patient can return home with the following A lot of help with walking and/or transfers;A lot of help with bathing/dressing/bathroom;Help with stairs or ramp for entrance   Equipment Recommendations  Rolling walker (2 wheels)    Recommendations for Other Services       Precautions / Restrictions Precautions Precautions: Fall Precaution Comments: monitor sats Restrictions Weight  Bearing Restrictions: No RLE Weight Bearing: Weight bearing as tolerated     Mobility  Bed Mobility Overal bed mobility: Needs Assistance Bed Mobility: Supine to Sit     Supine to sit: HOB elevated, Mod assist     General bed mobility comments: mod assist to guide trunk and RLE off of bed.    Transfers Overall transfer level: Needs assistance Equipment used: Rolling walker (2 wheels) Transfers: Sit to/from Stand Sit to Stand: Min assist, From elevated surface           General transfer comment: min assist needed to stand from elevated bed for safety.    Ambulation/Gait Ambulation/Gait assistance: Min assist Gait Distance (Feet): 6 Feet Assistive device: Rolling walker (2 wheels) Gait Pattern/deviations: Step-to pattern, Decreased stance time - right, Antalgic, Decreased stride length Gait velocity: decr     General Gait Details: Ambulated 6 feet to the door with min assist for safety.   Stairs             Wheelchair Mobility    Modified Rankin (Stroke Patients Only)       Balance                                            Cognition Arousal/Alertness: Awake/alert Behavior During Therapy: WFL for tasks assessed/performed Overall Cognitive Status: Difficult to assess                                 General Comments: follows commands and cues, very HOH even with hearing  aids        Exercises      General Comments        Pertinent Vitals/Pain Pain Assessment Pain Assessment: 0-10 Pain Score: 5  Pain Location: right hip Pain Descriptors / Indicators: Grimacing, Sore Pain Intervention(s): Monitored during session, Limited activity within patient's tolerance, Ice applied    Home Living Family/patient expects to be discharged to:: Private residence Living Arrangements: Children Available Help at Discharge: Family;Available 24 hours/day Type of Home: House Home Access: Stairs to enter   [employer] of Steps: 2   Home Layout: One level Home Equipment: None      Prior Function            PT Goals (current goals can now be found in the care plan section) Acute Rehab PT Goals PT Goal Formulation: With patient/family Time For Goal Achievement: 02/14/22 Potential to Achieve Goals: Good    Frequency    Min 3X/week      PT Plan      Co-evaluation              AM-PAC PT "6 Clicks" Mobility   Outcome Measure  Help needed turning from your back to your side while in a flat bed without using bedrails?: A Little Help needed moving from lying on your back to sitting on the side of a flat bed without using bedrails?: A Little Help needed moving to and from a bed to a chair (including a wheelchair)?: A Lot Help needed standing up from a chair using your arms (e.g., wheelchair or bedside chair)?: A Little Help needed to walk in hospital room?: A Little Help needed climbing 3-5 steps with a railing? : Total 6 Click Score: 15    End of Session Equipment Utilized During Treatment: Gait belt Activity Tolerance: Patient limited by fatigue;Patient limited by pain Patient left: with call bell/phone within reach;with family/visitor present;in chair;with chair alarm set Nurse Communication: Mobility status PT Visit Diagnosis: Difficulty in walking, not elsewhere classified (R26.2)     Time: 0092-3300 PT Time Calculation (min) (ACUTE ONLY): 17 min  Charges:  $Gait Training: 8-22 mins                        Allyson Sabal 02/04/2022, 3:43 PM

## 2022-02-04 NOTE — Plan of Care (Signed)

## 2022-02-04 NOTE — Progress Notes (Signed)
Patient ID: Miranda Wilkinson, female   DOB: 1935/06/30, 86 y.o.   MRN: [id number] Subjective: 5 Days Post-Op Procedure(s) (LRB): TOTAL HIP ARTHROPLASTY ANTERIOR APPROACH (Right)    Patient reports pain as mild to moderate She states she did a little walking yesterday and sat in the chair In her CPAP this am  Objective:   VITALS:   Vitals:   02/03/22 1944 02/04/22 0546  BP: (!) 155/84 (!) 155/73  Pulse: 75 81  Resp: 18 18  Temp: 98.6 F (37 C) 97.8 F (36.6 C)  SpO2: 97% 99%    Neurovascular intact Incision: dressing C/D/I  LABS Recent Labs    02/02/22 0511 02/03/22 0406 02/04/22 0507  HGB 11.5* 12.2 12.5  HCT 35.1* 38.1 37.6  WBC 8.9 10.8* 11.3*  PLT 260 295 236    Recent Labs    02/02/22 0511 02/03/22 0406 02/04/22 0507  NA 135 135 136  K 4.2 4.8 4.7  BUN 28* 22 18  CREATININE 1.04* 1.10* 0.80  GLUCOSE 147* 157* 146*    No results for input(s): "LABPT", "INR" in the last 72 hours.   Assessment/Plan: 5 Days Post-Op Procedure(s) (LRB): TOTAL HIP ARTHROPLASTY ANTERIOR APPROACH (Right)   Up with therapy Likely to SNF soon RTC in 2 weeks for wound check Discharge instructions in chart Hydrocodone for pain On Eliquis chronically

## 2022-02-04 NOTE — Progress Notes (Signed)
PT wearing personal CPAP equip and self managed

## 2022-02-04 NOTE — Discharge Summary (Signed)
Physician Discharge Summary  Miranda Wilkinson KGY:[id number] DOB: [birth year] DOA: 01/26/2022  PCP: Caryl Bis, MD  Admit date: 01/26/2022 Discharge date: 02/04/2022  Time spent: 55 minutes  Recommendations for Outpatient Follow-up:  Follow-up with Dr. Alvan Dame, orthopedics in 2 weeks. Follow-up with MD at SNF.  Blood pressure need to be reassessed.  Patient will need a basic metabolic profile done to follow-up on electrolytes and renal function in 1 week.   Discharge Diagnoses:  Principal Problem:   Closed displaced fracture of right femoral neck (HCC) Active Problems:   Acquired hypothyroidism   Essential hypertension, benign   Mixed hyperlipidemia   Type 2 diabetes mellitus with hyperglycemia (HCC)   Hyponatremia   Leukocytosis   Atrial fibrillation, chronic (HCC)   GERD (gastroesophageal reflux disease)   OSA on CPAP   Obesity (BMI 30-39.9)   Chronic kidney disease, stage 3a ([place])   UTI (urinary tract infection)   S/P total right hip arthroplasty   Discharge Condition: Stable and improved.  Diet recommendation: Regular  Filed Weights   02/02/22 0500 02/03/22 0500 02/04/22 0600  Weight: 107.2 kg 106.9 kg 107.5 kg    History of present illness:  HPI per Dr.Adefeso  Miranda Wilkinson is a 86 y.o. female with medical history significant of hypertension, hyperlipidemia, T2DM, atrial fibrillation on Eliquis, hypothyroidism, GERD, CKD 3A, HOH who presents to the emergency department via EMS due to a fall sustained at home.  Patient states that she slipped on a rug and fell landing on her right side and hitting her head but denies LOC, she started to complain of right hip pain after the fall and had difficulty in being able to bear weight on the right lower extremity.  EMS was activated and patient was taken to the ED for further evaluation and management.  Patient ambulates mostly with a walker at baseline, though she occasionally uses a cane as well.   ED Course:  In the  emergency department, she was hemodynamically stable, BP was 140/87 and other vital signs are within normal range.  Work-up in the ED showed WBC 13.8, hemoglobin 15.4, hematocrit 44.3, MCV 94.8, platelets 245.  BMP showed sodium 132, potassium 37, chloride 95, bicarb 25, glucose 209, BUN 15, creatinine 1.18 (creatinine is within baseline range). CT without contrast showed no evidence of acute intracranial abnormality Right hip x-ray showed acute  transverse mid cervical to basicervical right femoral neck fracture with impaction and mild cephalad displacement and rotation of the distal fragment. Patient was treated with IV morphine and Zofran.  Orthopedic surgeon on-call (Dr. Alvan Dame) was consulted and recommended that patient can be admitted here at [facility] and to consult with local orthopedic surgeon here in the morning.  Hospitalist was asked to admit for further evaluation and management.  Hospital Course:  1 closed displaced fracture right femoral neck -Secondary to mechanical fall. -Patient noted last dose of Eliquis was the p.m. dose of 01/26/2022. -Patient seen by orthopedics and unable to do surgical correction and to Eliquis wash out. -Patient transferred to [facility] for surgical correction per orthopedics, Dr. Alvan Dame which per RN will be performed tomorrow. -Decreased morphine to 1 mg every 4 hours as needed pain as patient noted to have soft blood pressure and increased O2 requirements after morphine dose given early on today. -Discontinued Lovenox preoperatively patient placed back on home regimen Eliquis for postop DVT prophylaxis.   -Patient is status post right total hip replacement per orthopedics, Dr. Olin,01/30/2022. -Pain management per orthopedics. -Patient  seen by PT, patient will be discharged to skilled nursing facility. -Outpatient follow-up with orthopedics.   2.  Leukocytosis -Patient noted to have a leukocytosis, low grade temp of 100.4 earlier on in the hospitalization. -Chest  x-ray done with no acute infiltrate noted. -Urinalysis concerning for UTI. -Urine cultures > 100,000 Morganella morganii. -Fever curve trended down. -Leukocytosis trended down.. -Patient completed a full course of IV Rocephin during the hospitalization.  -No further antibiotics needed on discharge.    3.  Morganella Morganii UTI -Urine cultures with >100,000 colonies of Morganella Morganii, sensitive to ciprofloxacin, gentamicin, imipenem, Bactrim, pip-tazo. -Discussed with pharmacy and noted that IV Rocephin will cover UTI as hesitant to place patient on Bactrim due to recent AKI.  -Discontinued Foley catheter 02/02/2022, as per family patient was not on a chronic Foley catheter prior to admission. -Patient completed a full course of IV Rocephin during the hospitalization, no further antibiotics needed.    4.  Diabetes mellitus type 2 -Hemoglobin A1c 7.6 (01/26/2022.) -Noted to have received Decadron. -Home regimen oral hypoglycemic agents were held during the hospitalization will be resumed on discharge.   -During the hospitalization patient maintained on Semglee 10 units daily as well as SSI.   -Outpatient follow-up.     5.  Hypothyroidism -Patient maintained on Synthroid.   6.  Hypertension -BP noted to be soft.  Concern morphine may be playing a role with decreased blood pressure. -Decreased morphine to 1 mg every 4 hours as needed. -BP improved and was stable on home regimen Zebeta.   -Patient's home regimen lisinopril was held and will not be resumed on discharge.   -Outpatient follow-up.    6.  GERD -Patient maintained on PPI.   7.  Hyperlipidemia -Patient maintained on statin.   8.  AKI on CKD stage IIIa -Slight bump in creatinine during the hospitalization. -Patient placed on gentle hydration which is subsequently discontinued. -Creatinine trended back down. -Patient's lisinopril was held during the hospitalization and would not be resumed on discharge. -Outpatient  follow-up.   9.  Class II obesity/OSA -Lifestyle modification. -CPAP nightly -Lifestyle modification.      Procedures: CT head 01/26/2022 Chest x-ray 01/29/2022 Right total hip replacement through an anterior approach per orthopedics: Dr. Alvan Dame 01/30/2022    Consultations: Orthopedics: Dr.Cairns 01/28/2022    Discharge Exam: Vitals:   02/04/22 0924 02/04/22 1259  BP: 117/62 (!) 136/54  Pulse: 77 83  Resp:    Temp:  97.8 F (36.6 C)  SpO2:  94%    General: NAD Cardiovascular: Irregularly irregular.  No murmurs rubs or gallops.  No JVD.  No lower extremity edema. Respiratory: Clear to auscultation bilaterally.  No wheezes, no crackles, no rhonchi.  Fair air movement.  Discharge Instructions   Discharge Instructions     Diet general   Complete by: As directed    Increase activity slowly   Complete by: As directed    No wound care   Complete by: As directed       Allergies as of 02/04/2022   No Known Allergies      Medication List     STOP taking these medications    hydrochlorothiazide 25 MG tablet Commonly known as: HYDRODIURIL   lisinopril 10 MG tablet Commonly known as: ZESTRIL       TAKE these medications    acetaminophen 500 MG tablet Commonly known as: TYLENOL Take 500 mg by mouth every 6 (six) hours as needed (for pain.).   bisoprolol 10 MG tablet  Commonly known as: ZEBETA Take 2 tablets (20 mg total) by mouth daily.   Eliquis 5 MG Tabs tablet Generic drug: apixaban Take 1 tablet (5 mg total) by mouth 2 (two) times daily.   GUAIFENESIN 1200 PO Take 1,200 mg by mouth 2 (two) times daily as needed (sinus congestion.).   HYDROcodone-acetaminophen 5-325 MG tablet Commonly known as: NORCO/VICODIN Take 1-2 tablets by mouth every 6 (six) hours as needed for moderate pain (pain score 4-6).   levothyroxine 112 MCG tablet Commonly known as: SYNTHROID Take 112 mcg by mouth daily before breakfast.   loratadine 10 MG tablet Commonly  known as: CLARITIN Take 1 tablet by mouth daily as needed for allergies.   melatonin 5 MG Tabs Take 2.5-5 mg by mouth at bedtime as needed (sleep).   metFORMIN 500 MG 24 hr tablet Commonly known as: GLUCOPHAGE-XR Take 1,000 mg by mouth daily with supper.   methocarbamol 500 MG tablet Commonly known as: ROBAXIN Take 1 tablet (500 mg total) by mouth every 6 (six) hours as needed for muscle spasms.   multivitamin with minerals tablet Take 1 tablet by mouth daily.   omeprazole 20 MG capsule Commonly known as: PRILOSEC Take 20 mg by mouth daily as needed (acid reflux/indigestion.).   Pegcetacoplan (Ophthalmic) 15 MG/0.1ML Soln Injection in right eye every two months   polyethylene glycol 17 g packet Commonly known as: MIRALAX / GLYCOLAX Take 17 g by mouth 2 (two) times daily.   polyvinyl alcohol 1.4 % ophthalmic solution Commonly known as: LIQUIFILM TEARS Place 1 drop into both eyes as needed for dry eyes.   senna-docusate 8.6-50 MG tablet Commonly known as: Senokot-S Take 2 tablets by mouth 2 (two) times daily.   simvastatin 40 MG tablet Commonly known as: ZOCOR Take 20 mg by mouth daily with supper.   traZODone 50 MG tablet Commonly known as: DESYREL Take 2 tablets (100 mg total) by mouth at bedtime as needed for sleep.   triamcinolone cream 0.1 % Commonly known as: KENALOG Apply 1 application topically 2 (two) times daily as needed (psoriasis).   Vitamin D3 50 MCG (2000 UT) capsule Take 2,000 Units by mouth daily.   witch hazel-glycerin pad Commonly known as: TUCKS Apply topically as needed for itching.       No Known Allergies  Follow-up Information     Paralee Cancel, MD. Schedule an appointment as soon as possible for a visit in 2 week(s).   Specialty: Orthopedic Surgery Contact information: [address] [place] [address] [phone]         MD AT SNF Follow up.                   The results of significant  diagnostics from this hospitalization (including imaging, microbiology, ancillary and laboratory) are listed below for reference.    Significant Diagnostic Studies: DG Pelvis Portable  Result Date: 01/30/2022 CLINICAL DATA:  RIGHT total hip arthroplasty EXAM: PORTABLE PELVIS 1-2 VIEWS COMPARISON:  Prior radiographs FINDINGS: RIGHT total hip arthroplasty identified without dislocation or complicating features on this single view. IMPRESSION: RIGHT total hip arthroplasty without complicating features. Electronically Signed   By: Margarette Canada M.D.   On: 01/30/2022 10:41   DG HIP UNILAT WITH PELVIS 1V RIGHT  Result Date: 01/30/2022 CLINICAL DATA:  Right hip fracture. EXAM: DG HIP (WITH OR WITHOUT PELVIS) 1V RIGHT COMPARISON:  01/26/2022 FINDINGS: Fluoroscopic images demonstrate a fracture involving the right femoral neck. Right total hip arthroplasty was performed. Right  hip arthroplasty appears located on these images without complicating feature. Radiation Exposure Index (as provided by the fluoroscopic device): 3.15 mGy Kerma IMPRESSION: 1. Right total hip arthroplasty without complicating features. Electronically Signed   By: Markus Daft M.D.   On: 01/30/2022 09:41   DG C-Arm 1-60 Min-No Report  Result Date: 01/30/2022 Fluoroscopy was utilized by the requesting physician.  No radiographic interpretation.   DG C-Arm 1-60 Min-No Report  Result Date: 01/30/2022 Fluoroscopy was utilized by the requesting physician.  No radiographic interpretation.   DG CHEST PORT 1 VIEW  Result Date: 01/29/2022 CLINICAL DATA:  Fever EXAM: PORTABLE CHEST 1 VIEW COMPARISON:  Radiograph 12/08/2019 FINDINGS: Unchanged large cardiac silhouette. There are mild interstitial opacities. No airspace consolidation. No pleural effusion or pneumothorax. No acute osseous abnormality. Thoracic spondylosis. IMPRESSION: Mild diffuse interstitial opacities, suggesting interstitial edema. Unchanged cardiomegaly. No focal airspace  consolidation. Electronically Signed   By: Maurine Simmering M.D.   On: 01/29/2022 10:55   CT Head Wo Contrast  Result Date: 01/26/2022 CLINICAL DATA:  Fall, on Eliquis EXAM: CT HEAD WITHOUT CONTRAST TECHNIQUE: Contiguous axial images were obtained from the base of the skull through the vertex without intravenous contrast. RADIATION DOSE REDUCTION: This exam was performed according to the departmental dose-optimization program which includes automated exposure control, adjustment of the mA and/or kV according to patient size and/or use of iterative reconstruction technique. COMPARISON:  None Available. FINDINGS: Brain: No evidence of acute infarction, hemorrhage, hydrocephalus, extra-axial collection or mass lesion/mass effect. Global cortical atrophy. Subcortical white matter and periventricular small vessel ischemic changes. Vascular: No hyperdense vessel or unexpected calcification. Skull: Normal. Negative for fracture or focal lesion. Sinuses/Orbits: The visualized paranasal sinuses are essentially clear. The mastoid air cells are unopacified. Other: None. IMPRESSION: No evidence of acute intracranial abnormality. Atrophy with small vessel ischemic changes. Electronically Signed   By: Julian Hy M.D.   On: 01/26/2022 22:23   DG Hip Unilat W or Wo Pelvis 2-3 Views Right  Result Date: 01/26/2022 CLINICAL DATA:  Right hip injury.  Fracture suspected. EXAM: DG HIP (WITH OR WITHOUT PELVIS) 2-3V RIGHT COMPARISON:  AP pelvis 11/06/2013 FINDINGS: The bone mineralization is osteopenic. There is an acute transverse mid cervical to basicervical right femoral neck fracture. The distal fragment demonstrates impaction and mild cephalad displacement with mild rotation. No pelvic fracture or diastasis is seen. The proximal left femur is unremarkable. There is mild nonerosive degenerative arthrosis of the SI joints, pubic symphysis and hips, facet hypertrophy at the lowest 2 lumbar levels. There are additional  findings of enthesopathy of the lateral pelvic wings and greater trochanters. No obvious soft tissue abnormality. IMPRESSION: 1. Acute transverse mid cervical to basicervical right femoral neck fracture with impaction and mild cephalad displacement and rotation of the distal fragment. 2. Osteopenia and degenerative changes. Electronically Signed   By: Telford Nab M.D.   On: 01/26/2022 22:04    Microbiology: Recent Results (from the past 240 hour(s))  MRSA Next Gen by PCR, Nasal     Status: None   Collection Time: 01/29/22  5:17 AM   Specimen: Nasal Mucosa; Nasal Swab  Result Value Ref Range Status   MRSA by PCR Next Gen NOT DETECTED NOT DETECTED Final    Comment: (NOTE) The GeneXpert MRSA Assay (FDA approved for NASAL specimens only), is one component of a comprehensive MRSA colonization surveillance program. It is not intended to diagnose MRSA infection nor to guide or monitor treatment for MRSA infections. Test performance is not FDA  approved in patients less than 17 years old. Performed at [facility], [place] [address] [place], [place] [address]   Urine Culture     Status: Abnormal   Collection Time: 01/29/22 10:20 AM   Specimen: Urine, Catheterized  Result Value Ref Range Status   Specimen Description   Final    URINE, CATHETERIZED Performed at [facility] [address] [place], [place] [address]    Special Requests   Final    NONE Performed at [facility], [place] [address], [place], [place] [address]    Culture >=100,000 COLONIES/mL [facility] MORGANII (A)  Final   Report Status 01/31/2022 FINAL  Final   Organism ID, Bacteria MORGANELLA MORGANII (A)  Final      Susceptibility   Morganella morganii - MIC*    AMPICILLIN >=32 RESISTANT Resistant     CEFAZOLIN >=64 RESISTANT Resistant     CIPROFLOXACIN <=0.25 SENSITIVE Sensitive     GENTAMICIN <=1 SENSITIVE Sensitive     IMIPENEM 2 SENSITIVE Sensitive      NITROFURANTOIN 128 RESISTANT Resistant     TRIMETH/SULFA <=20 SENSITIVE Sensitive     AMPICILLIN/SULBACTAM 16 INTERMEDIATE Intermediate     PIP/TAZO <=4 SENSITIVE Sensitive     * >=100,000 COLONIES/mL MORGANELLA MORGANII  Urine Culture     Status: Abnormal   Collection Time: 01/29/22 12:54 PM   Specimen: Urine, Clean Catch  Result Value Ref Range Status   Specimen Description   Final    URINE, CLEAN CATCH Performed at [facility], [place] [address] [place], [place] [address]    Special Requests   Final    NONE Performed at [facility], [place] [address], [place], [place] [address]    Culture >=100,000 COLONIES/mL [facility] MORGANII (A)  Final   Report Status 01/31/2022 FINAL  Final   Organism ID, Bacteria MORGANELLA MORGANII (A)  Final      Susceptibility   Morganella morganii - MIC*    AMPICILLIN >=32 RESISTANT Resistant     CEFAZOLIN >=64 RESISTANT Resistant     CIPROFLOXACIN <=0.25 SENSITIVE Sensitive     GENTAMICIN <=1 SENSITIVE Sensitive     IMIPENEM 2 SENSITIVE Sensitive     NITROFURANTOIN 128 RESISTANT Resistant     TRIMETH/SULFA <=20 SENSITIVE Sensitive     AMPICILLIN/SULBACTAM >=32 RESISTANT Resistant     PIP/TAZO <=4 SENSITIVE Sensitive     * >=100,000 COLONIES/mL MORGANELLA MORGANII     Labs: Basic Metabolic Panel: Recent Labs  Lab 01/30/22 0505 01/31/22 0449 02/01/22 0527 02/02/22 0511 02/03/22 0406 02/04/22 0507  NA 129* 129* 132* 135 135 136  K 4.2 4.5 4.5 4.2 4.8 4.7  CL 95* 95* 98 99 100 100  CO2 '25 23 25 25 25 26  '$ GLUCOSE 164* 233* 193* 147* 157* 146*  BUN 38* 32* 29* 28* 22 18  CREATININE 1.76* 1.22* 1.05* 1.04* 1.10* 0.80  CALCIUM 9.1 8.6* 9.2 9.2 9.4 9.5  MG  --   --   --  2.3  --   --   PHOS 3.2  --   --   --   --   --    Liver Function Tests: Recent Labs  Lab 01/30/22 0505  ALBUMIN 3.5   No results for input(s): "LIPASE", "AMYLASE" in the last 168 hours. No results for input(s): "AMMONIA" in the  last 168 hours. CBC: Recent Labs  Lab 01/30/22 0505 01/31/22 0449 02/01/22 0527 02/02/22 0511 02/03/22 0406 02/04/22 0507  WBC 12.1* 11.4* 10.5 8.9 10.8* 11.3*  NEUTROABS 9.5*  --   --   --   --   --   HGB 12.8 11.5* 11.1* 11.5* 12.2 12.5  HCT 39.7 34.0* 34.0* 35.1* 38.1 37.6  MCV 97.3 96.6 98.0 97.5 98.7 95.7  PLT 174 158 220 260 295 236   Cardiac Enzymes: No results for input(s): "CKTOTAL", "CKMB", "CKMBINDEX", "TROPONINI" in the last 168 hours. BNP: BNP (last 3 results) No results for input(s): "BNP" in the last 8760 hours.  ProBNP (last 3 results) No results for input(s): "PROBNP" in the last 8760 hours.  CBG: Recent Labs  Lab 02/03/22 1137 02/03/22 1626 02/03/22 1942 02/04/22 0750 02/04/22 1124  GLUCAP 272* 119* 188* 148* 176*       Signed:  Irine Seal MD.  [facility] 02/04/2022, 3:24 PM

## 2022-02-05 DIAGNOSIS — E1122 Type 2 diabetes mellitus with diabetic chronic kidney disease: Secondary | ICD-10-CM | POA: Diagnosis not present

## 2022-02-05 DIAGNOSIS — Z7901 Long term (current) use of anticoagulants: Secondary | ICD-10-CM | POA: Diagnosis not present

## 2022-02-05 DIAGNOSIS — I482 Chronic atrial fibrillation, unspecified: Secondary | ICD-10-CM | POA: Diagnosis not present

## 2022-02-05 DIAGNOSIS — L89152 Pressure ulcer of sacral region, stage 2: Secondary | ICD-10-CM | POA: Diagnosis not present

## 2022-02-05 DIAGNOSIS — N179 Acute kidney failure, unspecified: Secondary | ICD-10-CM

## 2022-02-05 DIAGNOSIS — Z7401 Bed confinement status: Secondary | ICD-10-CM | POA: Diagnosis not present

## 2022-02-05 DIAGNOSIS — Z9989 Dependence on other enabling machines and devices: Secondary | ICD-10-CM | POA: Diagnosis not present

## 2022-02-05 DIAGNOSIS — F32A Depression, unspecified: Secondary | ICD-10-CM | POA: Diagnosis not present

## 2022-02-05 DIAGNOSIS — N3000 Acute cystitis without hematuria: Secondary | ICD-10-CM | POA: Diagnosis not present

## 2022-02-05 DIAGNOSIS — Z7984 Long term (current) use of oral hypoglycemic drugs: Secondary | ICD-10-CM | POA: Diagnosis not present

## 2022-02-05 DIAGNOSIS — N1831 Chronic kidney disease, stage 3a: Secondary | ICD-10-CM | POA: Diagnosis not present

## 2022-02-05 DIAGNOSIS — D509 Iron deficiency anemia, unspecified: Secondary | ICD-10-CM | POA: Diagnosis not present

## 2022-02-05 DIAGNOSIS — M25521 Pain in right elbow: Secondary | ICD-10-CM | POA: Diagnosis not present

## 2022-02-05 DIAGNOSIS — W19XXXD Unspecified fall, subsequent encounter: Secondary | ICD-10-CM | POA: Diagnosis not present

## 2022-02-05 DIAGNOSIS — E785 Hyperlipidemia, unspecified: Secondary | ICD-10-CM | POA: Diagnosis not present

## 2022-02-05 DIAGNOSIS — Z96641 Presence of right artificial hip joint: Secondary | ICD-10-CM

## 2022-02-05 DIAGNOSIS — S72001D Fracture of unspecified part of neck of right femur, subsequent encounter for closed fracture with routine healing: Secondary | ICD-10-CM | POA: Diagnosis not present

## 2022-02-05 DIAGNOSIS — E119 Type 2 diabetes mellitus without complications: Secondary | ICD-10-CM | POA: Diagnosis not present

## 2022-02-05 DIAGNOSIS — Z471 Aftercare following joint replacement surgery: Secondary | ICD-10-CM | POA: Diagnosis not present

## 2022-02-05 DIAGNOSIS — J449 Chronic obstructive pulmonary disease, unspecified: Secondary | ICD-10-CM | POA: Diagnosis not present

## 2022-02-05 DIAGNOSIS — Z9981 Dependence on supplemental oxygen: Secondary | ICD-10-CM | POA: Diagnosis not present

## 2022-02-05 DIAGNOSIS — I10 Essential (primary) hypertension: Secondary | ICD-10-CM | POA: Diagnosis not present

## 2022-02-05 DIAGNOSIS — N390 Urinary tract infection, site not specified: Secondary | ICD-10-CM | POA: Diagnosis not present

## 2022-02-05 DIAGNOSIS — N189 Chronic kidney disease, unspecified: Secondary | ICD-10-CM

## 2022-02-05 DIAGNOSIS — R531 Weakness: Secondary | ICD-10-CM | POA: Diagnosis not present

## 2022-02-05 DIAGNOSIS — G4733 Obstructive sleep apnea (adult) (pediatric): Secondary | ICD-10-CM | POA: Diagnosis not present

## 2022-02-05 DIAGNOSIS — E039 Hypothyroidism, unspecified: Secondary | ICD-10-CM | POA: Diagnosis not present

## 2022-02-05 DIAGNOSIS — N1832 Chronic kidney disease, stage 3b: Secondary | ICD-10-CM | POA: Diagnosis not present

## 2022-02-05 DIAGNOSIS — D72829 Elevated white blood cell count, unspecified: Secondary | ICD-10-CM | POA: Diagnosis not present

## 2022-02-05 DIAGNOSIS — F419 Anxiety disorder, unspecified: Secondary | ICD-10-CM | POA: Diagnosis not present

## 2022-02-05 DIAGNOSIS — D649 Anemia, unspecified: Secondary | ICD-10-CM | POA: Diagnosis not present

## 2022-02-05 DIAGNOSIS — I129 Hypertensive chronic kidney disease with stage 1 through stage 4 chronic kidney disease, or unspecified chronic kidney disease: Secondary | ICD-10-CM | POA: Diagnosis not present

## 2022-02-05 DIAGNOSIS — E139 Other specified diabetes mellitus without complications: Secondary | ICD-10-CM | POA: Diagnosis not present

## 2022-02-05 LAB — GLUCOSE, CAPILLARY
Glucose-Capillary: 176 mg/dL — ABNORMAL HIGH (ref 70–99)
Glucose-Capillary: 251 mg/dL — ABNORMAL HIGH (ref 70–99)

## 2022-02-05 LAB — COMPREHENSIVE METABOLIC PANEL
ALT: 33 U/L (ref 0–44)
AST: 35 U/L (ref 15–41)
Albumin: 3 g/dL — ABNORMAL LOW (ref 3.5–5.0)
Alkaline Phosphatase: 69 U/L (ref 38–126)
Anion gap: 9 (ref 5–15)
BUN: 19 mg/dL (ref 8–23)
CO2: 26 mmol/L (ref 22–32)
Calcium: 9.7 mg/dL (ref 8.9–10.3)
Chloride: 98 mmol/L (ref 98–111)
Creatinine, Ser: 1.3 mg/dL — ABNORMAL HIGH (ref 0.44–1.00)
GFR, Estimated: 41 mL/min — ABNORMAL LOW (ref >60)
Glucose, Bld: 273 mg/dL — ABNORMAL HIGH (ref 70–99)
Potassium: 4.2 mmol/L (ref 3.5–5.1)
Sodium: 133 mmol/L — ABNORMAL LOW (ref 135–145)
Total Bilirubin: 0.6 mg/dL (ref 0.3–1.2)
Total Protein: 6.9 g/dL (ref 6.5–8.1)

## 2022-02-05 LAB — CBC WITH DIFFERENTIAL/PLATELET
Abs Immature Granulocytes: 0.97 10*3/uL — ABNORMAL HIGH (ref 0.00–0.07)
Basophils Absolute: 0.2 10*3/uL — ABNORMAL HIGH (ref 0.0–0.1)
Basophils Relative: 2 %
Eosinophils Absolute: 0.3 10*3/uL (ref 0.0–0.5)
Eosinophils Relative: 3 %
HCT: 39 % (ref 36.0–46.0)
Hemoglobin: 12.6 g/dL (ref 12.0–15.0)
Immature Granulocytes: 8 %
Lymphocytes Relative: 9 %
Lymphs Abs: 1.1 10*3/uL (ref 0.7–4.0)
MCH: 31.8 pg (ref 26.0–34.0)
MCHC: 32.3 g/dL (ref 30.0–36.0)
MCV: 98.5 fL (ref 80.0–100.0)
Monocytes Absolute: 0.5 10*3/uL (ref 0.1–1.0)
Monocytes Relative: 5 %
Neutro Abs: 8.5 10*3/uL — ABNORMAL HIGH (ref 1.7–7.7)
Neutrophils Relative %: 73 %
Platelets: 385 10*3/uL (ref 150–400)
RBC: 3.96 MIL/uL (ref 3.87–5.11)
RDW: 13.9 % (ref 11.5–15.5)
WBC: 11.6 10*3/uL — ABNORMAL HIGH (ref 4.0–10.5)
nRBC: 0 % (ref 0.0–0.2)

## 2022-02-05 LAB — MAGNESIUM: Magnesium: 1.8 mg/dL (ref 1.7–2.4)

## 2022-02-05 LAB — PHOSPHORUS: Phosphorus: 3.5 mg/dL (ref 2.5–4.6)

## 2022-02-05 MED ORDER — SODIUM CHLORIDE 0.9 % IV SOLN: INTRAVENOUS | Status: DC

## 2022-02-05 MED ORDER — SODIUM CHLORIDE 0.9 % IV BOLUS
500.0000 mL | Freq: Once | INTRAVENOUS | Status: AC
  Administered 2022-02-05: 500 mL via INTRAVENOUS

## 2022-02-05 NOTE — TOC Progression Note (Addendum)
Transition of Care [facility]) - Progression Note    Patient Details  Name: Miranda Wilkinson MRN: [id number] Date of Birth: Jan 01, 1936  Transition of [facility]) CM/SW Contact  Leeroy Cha, RN Phone Number: 02/05/2022, 9:45 AM  Clinical Narrative:    Tct-daughter-has chosen [facility] for snf.   Tct-whitney at [facility]/message left to call back.drequest for call back made. Per whitney there is a $43.00/day fee that medicare does not cover at [facility].  TCT-daughter Lenna Sciara yates message left to please call back. Tct-daughter renee-ok to send and will cover the co pay. Md notified of bed available. Expected Discharge Plan: [place] Barriers to Discharge: Continued Medical Work up  Expected Discharge Plan and Services Expected Discharge Plan: [place] In-house Referral: Clinical Social Work Discharge Planning Services: CM Consult   Living arrangements for the past 2 months: Single Family Home Expected Discharge Date: 02/04/22                                     Social Determinants of Health (SDOH) Interventions    Readmission Risk Interventions   No data to display

## 2022-02-05 NOTE — TOC Transition Note (Signed)
Transition of Care [facility]) - CM/SW Discharge Note   Patient Details  Name: QIANNA CLAGETT MRN: [id number] Date of Birth: [birth year]  Transition of Care [facility]) CM/SW Contact:  Leeroy Cha, RN Phone Number: 02/05/2022, 12:52 PM   Clinical Narrative:    [facility] called for transport at 1251.       Barriers to Discharge: Continued Medical Work up   Patient Goals and CMS Choice Patient states their goals for this hospitalization and ongoing recovery are:: SNF   Choice offered to / list presented to : Adult Children  Discharge Placement                       Discharge Plan and Services In-house Referral: Clinical Social Work Discharge Planning Services: CM Consult                                 Social Determinants of Health (SDOH) Interventions     Readmission Risk Interventions   No data to display

## 2022-02-05 NOTE — Plan of Care (Signed)
  Problem: Elimination: Goal: Will not experience complications related to bowel motility Outcome: Progressing Goal: Will not experience complications related to urinary retention Outcome: Progressing   

## 2022-02-05 NOTE — Discharge Summary (Signed)
Physician Discharge Summary  ASHELYNN Wilkinson DDU:[id number] DOB: 1935-07-03 DOA: 01/26/2022  PCP: Caryl Bis, MD  Admit date: 01/26/2022 Discharge date: 02/05/2022  Time spent: 55 minutes  Recommendations for Outpatient Follow-up:  Follow-up with Dr. Alvan Dame, orthopedics in 2 weeks. Follow-up with MD at SNF.  Blood pressure need to be reassessed.  Patient will need CBC,CMP, Mag, Phos within 1 week and have outpatient workup if Renal Fxn still remains elevated   Discharge Diagnoses:  Principal Problem:   Closed displaced fracture of right femoral neck (HCC) Active Problems:   Acquired hypothyroidism   Essential hypertension, benign   Mixed hyperlipidemia   Type 2 diabetes mellitus with hyperglycemia (HCC)   Hyponatremia   Leukocytosis   Atrial fibrillation, chronic (HCC)   GERD (gastroesophageal reflux disease)   OSA on CPAP   Obesity (BMI 30-39.9)   Chronic kidney disease, stage 3a ([place])   UTI (urinary tract infection)   S/P total right hip arthroplasty   Discharge Condition: Stable and improved.  Diet recommendation: Regular  Filed Weights   02/03/22 0500 02/04/22 0600 02/05/22 0500  Weight: 106.9 kg 107.5 kg 105.7 kg    History of present illness:  HPI per Dr.Adefeso  Miranda Wilkinson is a 86 y.o. female with medical history significant of hypertension, hyperlipidemia, T2DM, atrial fibrillation on Eliquis, hypothyroidism, GERD, CKD 3A, HOH who presents to the emergency department via EMS due to a fall sustained at home.  Patient states that she slipped on a rug and fell landing on her right side and hitting her head but denies LOC, she started to complain of right hip pain after the fall and had difficulty in being able to bear weight on the right lower extremity.  EMS was activated and patient was taken to the ED for further evaluation and management.  Patient ambulates mostly with a walker at baseline, though she occasionally uses a cane as well.   ED Course:  In the  emergency department, she was hemodynamically stable, BP was 140/87 and other vital signs are within normal range.  Work-up in the ED showed WBC 13.8, hemoglobin 15.4, hematocrit 44.3, MCV 94.8, platelets 245.  BMP showed sodium 132, potassium 37, chloride 95, bicarb 25, glucose 209, BUN 15, creatinine 1.18 (creatinine is within baseline range). CT without contrast showed no evidence of acute intracranial abnormality Right hip x-ray showed acute  transverse mid cervical to basicervical right femoral neck fracture with impaction and mild cephalad displacement and rotation of the distal fragment. Patient was treated with IV morphine and Zofran.  Orthopedic surgeon on-call (Dr. Alvan Dame) was consulted and recommended that patient can be admitted here at [place] and to consult with local orthopedic surgeon here in the morning.  Hospitalist was asked to admit for further evaluation and management.  Hospital Course:  1. Closed Displaced Fracture Right Femoral Neck -Secondary to mechanical fall. -Patient noted last dose of Eliquis was the p.m. dose of 01/26/2022. -Patient seen by orthopedics and unable to do surgical correction and to Eliquis wash out. -Patient transferred to [facility] for surgical correction per orthopedics, Dr. Alvan Dame and this was done 01/30/2022 -Decreased morphine to 1 mg every 4 hours as needed pain as patient noted to have soft blood pressure and increased O2 requirements after morphine dose given early on previously. -Discontinued Lovenox preoperatively patient placed back on home regimen Eliquis for postop DVT prophylaxis.   -Patient is status post right total hip replacement per orthopedics, Dr. Olin,01/30/2022. -Pain management per orthopedics. -Patient seen  by PT, patient will be discharged to skilled nursing facility. -Outpatient follow-up with orthopedics.   2.  Leukocytosis, stable -Patient noted to have a leukocytosis, low grade temp of 100.4 earlier on in the  hospitalization. -Chest x-ray done with no acute infiltrate noted. -Urinalysis concerning for UTI. -Urine cultures > 100,000 Morganella morganii. -Fever curve trended down. -Leukocytosis trended down slightly trended up in the setting of pain and surgical intervention -WBC went from 8.9 -> 10.8 -> 11.3 -> 11.6 -Patient completed a full course of IV Rocephin during the hospitalization.  -No further antibiotics needed on discharge.    3.  Morganella Morganii UTI -Urine cultures with >100,000 colonies of Morganella Morganii, sensitive to ciprofloxacin, gentamicin, imipenem, Bactrim, pip-tazo. -Discussed with pharmacy and noted that IV Rocephin will cover UTI as hesitant to place patient on Bactrim due to recent AKI.   -Discontinued Foley catheter 02/02/2022, as per family patient was not on a chronic Foley catheter prior to admission. -Patient completed a full course of IV Rocephin during the hospitalization, no further antibiotics needed.  -Patient can follow-up in outpatient setting with her PCP   4.  Diabetes mellitus type 2 -Hemoglobin A1c 7.6 (01/26/2022.) -Noted to have received Decadron. -Home regimen oral hypoglycemic agents were held during the hospitalization will be resumed on discharge.   -During the hospitalization patient maintained on Semglee 10 units daily as well as SSI.   -Outpatient follow-up.     5.  Hypothyroidism -Patient maintained on Synthroid.   6.  Hypertension -BP noted to be soft.  Concern morphine may be playing a role with decreased blood pressure. -Decreased morphine to 1 mg every 4 hours as needed. -BP improved and was stable on home regimen Zebeta.   -Patient's home regimen lisinopril was held and will not be resumed on discharge.   -Outpatient follow-up.    6.  GERD -Patient maintained on PPI.   7.  Hyperlipidemia -Patient maintained on statin.   8.  AKI on CKD stage IIIa -Slight bump in creatinine during the hospitalization. -Patient placed on  gentle hydration which is subsequently discontinued but now resumed prior to discharge -Creatinine trended back down yesterday but she has not been drinking as well so we will give her 500 mL bolus prior to discharge and encourage oral intake -Patient's BUNs/creatinine went from 28/1.04 -> 22/1.10 -> 18/0.80 -> 19/1.30. -Patient's lisinopril was held during the hospitalization and would not be resumed on discharge. -Outpatient follow-up and recommend further workup if creatinine still remains elevated at discharge.   9. Obesity/OSA -Complicates overall prognosis and care -Estimated body mass index is 38.78 kg/m as calculated from the following:   Height as of this encounter: '5\' 5"'$  (1.651 m).   Weight as of this encounter: 105.7 kg.  -Weight Loss and Dietary Counseling given -Lifestyle modification. -CPAP nightly   10. Hypoalbuminemia -Mild.  Patient's albumin level is now 3.0 -Continue monitor and trend and repeat CMP and within 1 week  11. PseudoHyponatremia -Mild and Na+ is 133 but Corrected for Glucose is 136 -Given 500 mL bolus prior to D/C and started on Maintenance IVF with NS at 75 mL/hr to D/C  -Return trend and repeat CMP within 1 week    Procedures: CT head 01/26/2022 Chest x-ray 01/29/2022 Right total hip replacement through an anterior approach per orthopedics: Dr. Alvan Dame 01/30/2022    Consultations: Orthopedics: Dr.Cairns 01/28/2022  Discharge Exam: Vitals:   02/05/22 0507 02/05/22 0802  BP: (!) 150/69   Pulse: 79   Resp: 15  Temp: 97.8 F (36.6 C)   SpO2: 96% 97%   General: NAD Cardiovascular: Irregularly irregular.  No murmurs rubs or gallops.  No JVD.  No lower extremity edema. Respiratory: Clear to auscultation bilaterally.  No wheezes, no crackles, no rhonchi.  Fair air movement. Skin: Had bruising and ecchymosis on Right Arm from fall  Discharge Instructions  Discharge Instructions     Diet general   Complete by: As directed    Increase  activity slowly   Complete by: As directed    No wound care   Complete by: As directed       Allergies as of 02/05/2022   No Known Allergies      Medication List     STOP taking these medications    hydrochlorothiazide 25 MG tablet Commonly known as: HYDRODIURIL   lisinopril 10 MG tablet Commonly known as: ZESTRIL       TAKE these medications    acetaminophen 500 MG tablet Commonly known as: TYLENOL Take 500 mg by mouth every 6 (six) hours as needed (for pain.).   bisoprolol 10 MG tablet Commonly known as: ZEBETA Take 2 tablets (20 mg total) by mouth daily.   Eliquis 5 MG Tabs tablet Generic drug: apixaban Take 1 tablet (5 mg total) by mouth 2 (two) times daily.   GUAIFENESIN 1200 PO Take 1,200 mg by mouth 2 (two) times daily as needed (sinus congestion.).   HYDROcodone-acetaminophen 5-325 MG tablet Commonly known as: NORCO/VICODIN Take 1-2 tablets by mouth every 6 (six) hours as needed for moderate pain (pain score 4-6).   levothyroxine 112 MCG tablet Commonly known as: SYNTHROID Take 112 mcg by mouth daily before breakfast.   loratadine 10 MG tablet Commonly known as: CLARITIN Take 1 tablet by mouth daily as needed for allergies.   melatonin 5 MG Tabs Take 2.5-5 mg by mouth at bedtime as needed (sleep).   metFORMIN 500 MG 24 hr tablet Commonly known as: GLUCOPHAGE-XR Take 1,000 mg by mouth daily with supper.   methocarbamol 500 MG tablet Commonly known as: ROBAXIN Take 1 tablet (500 mg total) by mouth every 6 (six) hours as needed for muscle spasms.   multivitamin with minerals tablet Take 1 tablet by mouth daily.   omeprazole 20 MG capsule Commonly known as: PRILOSEC Take 20 mg by mouth daily as needed (acid reflux/indigestion.).   Pegcetacoplan (Ophthalmic) 15 MG/0.1ML Soln Injection in right eye every two months   polyethylene glycol 17 g packet Commonly known as: MIRALAX / GLYCOLAX Take 17 g by mouth 2 (two) times daily.    polyvinyl alcohol 1.4 % ophthalmic solution Commonly known as: LIQUIFILM TEARS Place 1 drop into both eyes as needed for dry eyes.   senna-docusate 8.6-50 MG tablet Commonly known as: Senokot-S Take 2 tablets by mouth 2 (two) times daily.   simvastatin 40 MG tablet Commonly known as: ZOCOR Take 20 mg by mouth daily with supper.   traZODone 50 MG tablet Commonly known as: DESYREL Take 2 tablets (100 mg total) by mouth at bedtime as needed for sleep.   triamcinolone cream 0.1 % Commonly known as: KENALOG Apply 1 application topically 2 (two) times daily as needed (psoriasis).   Vitamin D3 50 MCG (2000 UT) capsule Take 2,000 Units by mouth daily.   witch hazel-glycerin pad Commonly known as: TUCKS Apply topically as needed for itching.       No Known Allergies  Follow-up Information     Paralee Cancel, MD. Schedule an appointment as soon  as possible for a visit in 2 week(s).   Specialty: Orthopedic Surgery Contact information: [address] [place] [address] [phone]         MD AT SNF Follow up.                   The results of significant diagnostics from this hospitalization (including imaging, microbiology, ancillary and laboratory) are listed below for reference.    Significant Diagnostic Studies: DG Pelvis Portable  Result Date: 01/30/2022 CLINICAL DATA:  RIGHT total hip arthroplasty EXAM: PORTABLE PELVIS 1-2 VIEWS COMPARISON:  Prior radiographs FINDINGS: RIGHT total hip arthroplasty identified without dislocation or complicating features on this single view. IMPRESSION: RIGHT total hip arthroplasty without complicating features. Electronically Signed   By: Margarette Canada M.D.   On: 01/30/2022 10:41   DG HIP UNILAT WITH PELVIS 1V RIGHT  Result Date: 01/30/2022 CLINICAL DATA:  Right hip fracture. EXAM: DG HIP (WITH OR WITHOUT PELVIS) 1V RIGHT COMPARISON:  01/26/2022 FINDINGS: Fluoroscopic images demonstrate a fracture involving  the right femoral neck. Right total hip arthroplasty was performed. Right hip arthroplasty appears located on these images without complicating feature. Radiation Exposure Index (as provided by the fluoroscopic device): 3.15 mGy Kerma IMPRESSION: 1. Right total hip arthroplasty without complicating features. Electronically Signed   By: Markus Daft M.D.   On: 01/30/2022 09:41   DG C-Arm 1-60 Min-No Report  Result Date: 01/30/2022 Fluoroscopy was utilized by the requesting physician.  No radiographic interpretation.   DG C-Arm 1-60 Min-No Report  Result Date: 01/30/2022 Fluoroscopy was utilized by the requesting physician.  No radiographic interpretation.   DG CHEST PORT 1 VIEW  Result Date: 01/29/2022 CLINICAL DATA:  Fever EXAM: PORTABLE CHEST 1 VIEW COMPARISON:  Radiograph 12/08/2019 FINDINGS: Unchanged large cardiac silhouette. There are mild interstitial opacities. No airspace consolidation. No pleural effusion or pneumothorax. No acute osseous abnormality. Thoracic spondylosis. IMPRESSION: Mild diffuse interstitial opacities, suggesting interstitial edema. Unchanged cardiomegaly. No focal airspace consolidation. Electronically Signed   By: Maurine Simmering M.D.   On: 01/29/2022 10:55   CT Head Wo Contrast  Result Date: 01/26/2022 CLINICAL DATA:  Fall, on Eliquis EXAM: CT HEAD WITHOUT CONTRAST TECHNIQUE: Contiguous axial images were obtained from the base of the skull through the vertex without intravenous contrast. RADIATION DOSE REDUCTION: This exam was performed according to the departmental dose-optimization program which includes automated exposure control, adjustment of the mA and/or kV according to patient size and/or use of iterative reconstruction technique. COMPARISON:  None Available. FINDINGS: Brain: No evidence of acute infarction, hemorrhage, hydrocephalus, extra-axial collection or mass lesion/mass effect. Global cortical atrophy. Subcortical white matter and periventricular small  vessel ischemic changes. Vascular: No hyperdense vessel or unexpected calcification. Skull: Normal. Negative for fracture or focal lesion. Sinuses/Orbits: The visualized paranasal sinuses are essentially clear. The mastoid air cells are unopacified. Other: None. IMPRESSION: No evidence of acute intracranial abnormality. Atrophy with small vessel ischemic changes. Electronically Signed   By: Julian Hy M.D.   On: 01/26/2022 22:23   DG Hip Unilat W or Wo Pelvis 2-3 Views Right  Result Date: 01/26/2022 CLINICAL DATA:  Right hip injury.  Fracture suspected. EXAM: DG HIP (WITH OR WITHOUT PELVIS) 2-3V RIGHT COMPARISON:  AP pelvis 11/06/2013 FINDINGS: The bone mineralization is osteopenic. There is an acute transverse mid cervical to basicervical right femoral neck fracture. The distal fragment demonstrates impaction and mild cephalad displacement with mild rotation. No pelvic fracture or diastasis is seen. The proximal left femur is  unremarkable. There is mild nonerosive degenerative arthrosis of the SI joints, pubic symphysis and hips, facet hypertrophy at the lowest 2 lumbar levels. There are additional findings of enthesopathy of the lateral pelvic wings and greater trochanters. No obvious soft tissue abnormality. IMPRESSION: 1. Acute transverse mid cervical to basicervical right femoral neck fracture with impaction and mild cephalad displacement and rotation of the distal fragment. 2. Osteopenia and degenerative changes. Electronically Signed   By: Telford Nab M.D.   On: 01/26/2022 22:04    Microbiology: Recent Results (from the past 240 hour(s))  MRSA Next Gen by PCR, Nasal     Status: None   Collection Time: 01/29/22  5:17 AM   Specimen: Nasal Mucosa; Nasal Swab  Result Value Ref Range Status   MRSA by PCR Next Gen NOT DETECTED NOT DETECTED Final    Comment: (NOTE) The GeneXpert MRSA Assay (FDA approved for NASAL specimens only), is one component of a comprehensive MRSA colonization  surveillance program. It is not intended to diagnose MRSA infection nor to guide or monitor treatment for MRSA infections. Test performance is not FDA approved in patients less than 41 years old. Performed at [facility], [place] [address] [place], [place] [address]   Urine Culture     Status: Abnormal   Collection Time: 01/29/22 10:20 AM   Specimen: Urine, Catheterized  Result Value Ref Range Status   Specimen Description   Final    URINE, CATHETERIZED Performed at [place] [address], [place], [place] [address]    Special Requests   Final    NONE Performed at [facility], [place] [address], [place], [place] [address]    Culture >=100,000 COLONIES/mL [facility] MORGANII (A)  Final   Report Status 01/31/2022 FINAL  Final   Organism ID, Bacteria MORGANELLA MORGANII (A)  Final      Susceptibility   Morganella morganii - MIC*    AMPICILLIN >=32 RESISTANT Resistant     CEFAZOLIN >=64 RESISTANT Resistant     CIPROFLOXACIN <=0.25 SENSITIVE Sensitive     GENTAMICIN <=1 SENSITIVE Sensitive     IMIPENEM 2 SENSITIVE Sensitive     NITROFURANTOIN 128 RESISTANT Resistant     TRIMETH/SULFA <=20 SENSITIVE Sensitive     AMPICILLIN/SULBACTAM 16 INTERMEDIATE Intermediate     PIP/TAZO <=4 SENSITIVE Sensitive     * >=100,000 COLONIES/mL MORGANELLA MORGANII  Urine Culture     Status: Abnormal   Collection Time: 01/29/22 12:54 PM   Specimen: Urine, Clean Catch  Result Value Ref Range Status   Specimen Description   Final    URINE, CLEAN CATCH Performed at [facility], [place] [address], [place], [place] [address]    Special Requests   Final    NONE Performed at [facility], [place] [address], [place], [place] [address]    Culture >=100,000 COLONIES/mL [facility] MORGANII (A)  Final   Report Status 01/31/2022 FINAL  Final   Organism ID, Bacteria MORGANELLA MORGANII (A)  Final      Susceptibility    Morganella morganii - MIC*    AMPICILLIN >=32 RESISTANT Resistant     CEFAZOLIN >=64 RESISTANT Resistant     CIPROFLOXACIN <=0.25 SENSITIVE Sensitive     GENTAMICIN <=1 SENSITIVE Sensitive     IMIPENEM 2 SENSITIVE Sensitive     NITROFURANTOIN 128 RESISTANT Resistant     TRIMETH/SULFA <=20 SENSITIVE Sensitive     AMPICILLIN/SULBACTAM >=32 RESISTANT Resistant     PIP/TAZO <=4  SENSITIVE Sensitive     * >=100,000 COLONIES/mL [facility] MORGANII    Labs: Basic Metabolic Panel: Recent Labs  Lab 01/30/22 0505 01/31/22 0449 02/01/22 0527 02/02/22 0511 02/03/22 0406 02/04/22 0507 02/05/22 1007  NA 129*   < > 132* 135 135 136 133*  K 4.2   < > 4.5 4.2 4.8 4.7 4.2  CL 95*   < > 98 99 100 100 98  CO2 25   < > '25 25 25 26 26  '$ GLUCOSE 164*   < > 193* 147* 157* 146* 273*  BUN 38*   < > 29* 28* '22 18 19  '$ CREATININE 1.76*   < > 1.05* 1.04* 1.10* 0.80 1.30*  CALCIUM 9.1   < > 9.2 9.2 9.4 9.5 9.7  MG  --   --   --  2.3  --   --  1.8  PHOS 3.2  --   --   --   --   --  3.5   < > = values in this interval not displayed.    Liver Function Tests: Recent Labs  Lab 01/30/22 0505 02/05/22 1007  AST  --  35  ALT  --  33  ALKPHOS  --  69  BILITOT  --  0.6  PROT  --  6.9  ALBUMIN 3.5 3.0*    No results for input(s): "LIPASE", "AMYLASE" in the last 168 hours. No results for input(s): "AMMONIA" in the last 168 hours. CBC: Recent Labs  Lab 01/30/22 0505 01/31/22 0449 02/01/22 0527 02/02/22 0511 02/03/22 0406 02/04/22 0507 02/05/22 1007  WBC 12.1*   < > 10.5 8.9 10.8* 11.3* 11.6*  NEUTROABS 9.5*  --   --   --   --   --  8.5*  HGB 12.8   < > 11.1* 11.5* 12.2 12.5 12.6  HCT 39.7   < > 34.0* 35.1* 38.1 37.6 39.0  MCV 97.3   < > 98.0 97.5 98.7 95.7 98.5  PLT 174   < > 220 260 295 236 385   < > = values in this interval not displayed.    Cardiac Enzymes: No results for input(s): "CKTOTAL", "CKMB", "CKMBINDEX", "TROPONINI" in the last 168 hours. BNP: BNP (last 3 results) No  results for input(s): "BNP" in the last 8760 hours.  ProBNP (last 3 results) No results for input(s): "PROBNP" in the last 8760 hours.  CBG: Recent Labs  Lab 02/04/22 0750 02/04/22 1124 02/04/22 1628 02/04/22 2105 02/05/22 0726  GLUCAP 148* 176* 132* 182* 176*    Signed:  Raiford Noble, DO  [facility] 02/05/2022, 11:30 AM

## 2022-02-05 NOTE — TOC Initial Note (Signed)
Transition of Care [facility]) - Initial/Assessment Note    Patient Details  Name: Miranda Wilkinson MRN: [id number] Date of Birth: [birth year]  Transition of Care [facility]) CM/SW Contact:    Henrietta Dine, RN Phone Number: 02/05/2022, 9:33 AM  Clinical Narrative:                 Contacted by Ebony Hail at [facility] that the pt has a bed offer; will pass on to oncoming TOC.   Expected Discharge Plan: Skilled Nursing Facility Barriers to Discharge: Continued Medical Work up   Patient Goals and CMS Choice Patient states their goals for this hospitalization and ongoing recovery are:: SNF   Choice offered to / list presented to : Adult Children  Expected Discharge Plan and Services Expected Discharge Plan: [place] In-house Referral: Clinical Social Work Discharge Planning Services: CM Consult   Living arrangements for the past 2 months: [place] Expected Discharge Date: 02/04/22                                    Prior Living Arrangements/Services Living arrangements for the past 2 months: Single Family Home Lives with:: Adult Children Patient language and need for interpreter reviewed:: Yes Do you feel safe going back to the place where you live?: Yes      Need for Family Participation in Patient Care: Yes (Comment) Care giver support system in place?: Yes (comment) Current home services: DME (walker, shower seat, cpap, oxygen) Criminal Activity/Legal Involvement Pertinent to Current Situation/Hospitalization: No - Comment as needed  Activities of Daily Living Home Assistive Devices/Equipment: Walker (specify type), Cane (specify quad or straight), Oxygen, CPAP ADL Screening (condition at time of admission) Patient's cognitive ability adequate to safely complete daily activities?: Yes Is the patient deaf or have difficulty hearing?: Yes Does the patient have difficulty seeing, even when wearing glasses/contacts?: Yes Does the patient have  difficulty concentrating, remembering, or making decisions?: No Patient able to express need for assistance with ADLs?: Yes Does the patient have difficulty dressing or bathing?: No Independently performs ADLs?: Yes (appropriate for developmental age) Does the patient have difficulty walking or climbing stairs?: Yes Weakness of Legs: Both Weakness of Arms/Hands: None  Permission Sought/Granted Permission sought to share information with : Case Manager Permission granted to share information with : Yes, Verbal Permission Granted  Share Information with NAME: Lenor Coffin, RN, CM     Permission granted to share info w Relationship: Catalina Antigua (dtr) [phone] / Yevonne Pax (dtr) [phone]     Emotional Assessment Appearance:: Appears stated age Attitude/Demeanor/Rapport: Gracious Affect (typically observed): Accepting Orientation: : Oriented to Self, Oriented to Place, Oriented to  Time, Oriented to Situation Alcohol / Substance Use: Not Applicable Psych Involvement: No (comment)  Admission diagnosis:  Closed fracture of neck of right femur, initial encounter ([place]) [S72.001A] Closed displaced fracture of right femoral neck ([place]) [S72.001A] Patient Active Problem List   Diagnosis Date Noted   S/P total right hip arthroplasty 01/30/2022   UTI (urinary tract infection) 01/29/2022   Type 2 diabetes mellitus with hyperglycemia ([place]) 01/27/2022   Hyponatremia 01/27/2022   Leukocytosis 01/27/2022   Atrial fibrillation, chronic ([place]) 01/27/2022   GERD (gastroesophageal reflux disease) 01/27/2022   OSA on CPAP 01/27/2022   Obesity (BMI 30-39.9) 01/27/2022   Chronic kidney disease, stage 3a ([place]) 01/27/2022   Closed displaced fracture of right femoral neck ([place]) 01/26/2022   Severe  sepsis ([place]) 12/08/2019   Diarrhea 12/08/2019   AKI (acute kidney injury) ([place]) 12/08/2019   Atrial fibrillation with rapid ventricular response ([place]) 12/08/2019   Sepsis ([place]) 12/08/2019   Iron  deficiency anemia 11/05/2019   Symptomatic anemia 11/05/2019   Atypical atrial flutter ([place]) 11/03/2019   Persistent atrial fibrillation ([place]) 10/14/2019   Secondary hypercoagulable state ([place]) 10/14/2019   COPD GOLD 0/ group B 09/26/2019   Morbid obesity due to excess calories ([place]) comp by hyperlipidemia/ dm/ hbp 09/26/2019   Depression 11/22/2013   Mixed hyperlipidemia 11/22/2013   Allergic rhinitis 11/22/2013   Acquired hypothyroidism 11/08/2013   Diabetes ([place]) 11/08/2013   Essential hypertension, benign 11/08/2013   Closed right ankle fracture 11/03/2013   PCP:  Caryl Bis, MD Pharmacy:   [facility] - [place], [place] [address] Phone: [phone] Fax: [phone]  [facility] [place], [place] [address] [place] [address] Phone: [phone] Fax: [phone]     Social Determinants of Health (SDOH) Interventions    Readmission Risk Interventions     No data to display

## 2022-02-07 DIAGNOSIS — E039 Hypothyroidism, unspecified: Secondary | ICD-10-CM | POA: Diagnosis not present

## 2022-02-07 DIAGNOSIS — E139 Other specified diabetes mellitus without complications: Secondary | ICD-10-CM | POA: Diagnosis not present

## 2022-02-07 DIAGNOSIS — W19XXXD Unspecified fall, subsequent encounter: Secondary | ICD-10-CM | POA: Diagnosis not present

## 2022-02-07 DIAGNOSIS — Z9989 Dependence on other enabling machines and devices: Secondary | ICD-10-CM | POA: Diagnosis not present

## 2022-02-07 DIAGNOSIS — N3000 Acute cystitis without hematuria: Secondary | ICD-10-CM | POA: Diagnosis not present

## 2022-02-07 DIAGNOSIS — I129 Hypertensive chronic kidney disease with stage 1 through stage 4 chronic kidney disease, or unspecified chronic kidney disease: Secondary | ICD-10-CM | POA: Diagnosis not present

## 2022-02-07 DIAGNOSIS — S72001D Fracture of unspecified part of neck of right femur, subsequent encounter for closed fracture with routine healing: Secondary | ICD-10-CM | POA: Diagnosis not present

## 2022-02-07 DIAGNOSIS — N1832 Chronic kidney disease, stage 3b: Secondary | ICD-10-CM | POA: Diagnosis not present

## 2022-02-07 DIAGNOSIS — G4733 Obstructive sleep apnea (adult) (pediatric): Secondary | ICD-10-CM | POA: Diagnosis not present

## 2022-02-07 DIAGNOSIS — E785 Hyperlipidemia, unspecified: Secondary | ICD-10-CM | POA: Diagnosis not present

## 2022-02-07 DIAGNOSIS — Z7984 Long term (current) use of oral hypoglycemic drugs: Secondary | ICD-10-CM | POA: Diagnosis not present

## 2022-02-17 DIAGNOSIS — L89152 Pressure ulcer of sacral region, stage 2: Secondary | ICD-10-CM | POA: Diagnosis not present

## 2022-02-17 DIAGNOSIS — D649 Anemia, unspecified: Secondary | ICD-10-CM | POA: Diagnosis not present

## 2022-02-17 DIAGNOSIS — E119 Type 2 diabetes mellitus without complications: Secondary | ICD-10-CM | POA: Diagnosis not present

## 2022-02-21 DIAGNOSIS — Z471 Aftercare following joint replacement surgery: Secondary | ICD-10-CM | POA: Diagnosis not present

## 2022-02-21 DIAGNOSIS — M25521 Pain in right elbow: Secondary | ICD-10-CM | POA: Diagnosis not present

## 2022-02-21 DIAGNOSIS — Z96641 Presence of right artificial hip joint: Secondary | ICD-10-CM | POA: Diagnosis not present

## 2022-02-26 DIAGNOSIS — F419 Anxiety disorder, unspecified: Secondary | ICD-10-CM | POA: Diagnosis not present

## 2022-02-26 DIAGNOSIS — I129 Hypertensive chronic kidney disease with stage 1 through stage 4 chronic kidney disease, or unspecified chronic kidney disease: Secondary | ICD-10-CM | POA: Diagnosis not present

## 2022-02-26 DIAGNOSIS — E669 Obesity, unspecified: Secondary | ICD-10-CM | POA: Diagnosis not present

## 2022-02-26 DIAGNOSIS — Z7901 Long term (current) use of anticoagulants: Secondary | ICD-10-CM | POA: Diagnosis not present

## 2022-02-26 DIAGNOSIS — E1122 Type 2 diabetes mellitus with diabetic chronic kidney disease: Secondary | ICD-10-CM | POA: Diagnosis not present

## 2022-02-26 DIAGNOSIS — N1831 Chronic kidney disease, stage 3a: Secondary | ICD-10-CM | POA: Diagnosis not present

## 2022-02-26 DIAGNOSIS — I4891 Unspecified atrial fibrillation: Secondary | ICD-10-CM | POA: Diagnosis not present

## 2022-02-26 DIAGNOSIS — Z87891 Personal history of nicotine dependence: Secondary | ICD-10-CM | POA: Diagnosis not present

## 2022-02-26 DIAGNOSIS — D509 Iron deficiency anemia, unspecified: Secondary | ICD-10-CM | POA: Diagnosis not present

## 2022-02-26 DIAGNOSIS — S72001D Fracture of unspecified part of neck of right femur, subsequent encounter for closed fracture with routine healing: Secondary | ICD-10-CM | POA: Diagnosis not present

## 2022-02-26 DIAGNOSIS — K219 Gastro-esophageal reflux disease without esophagitis: Secondary | ICD-10-CM | POA: Diagnosis not present

## 2022-02-26 DIAGNOSIS — J439 Emphysema, unspecified: Secondary | ICD-10-CM | POA: Diagnosis not present

## 2022-02-26 DIAGNOSIS — Z7984 Long term (current) use of oral hypoglycemic drugs: Secondary | ICD-10-CM | POA: Diagnosis not present

## 2022-02-26 DIAGNOSIS — Z9181 History of falling: Secondary | ICD-10-CM | POA: Diagnosis not present

## 2022-02-26 DIAGNOSIS — Z6836 Body mass index (BMI) 36.0-36.9, adult: Secondary | ICD-10-CM | POA: Diagnosis not present

## 2022-02-26 DIAGNOSIS — W19XXXD Unspecified fall, subsequent encounter: Secondary | ICD-10-CM | POA: Diagnosis not present

## 2022-02-26 DIAGNOSIS — Z96641 Presence of right artificial hip joint: Secondary | ICD-10-CM | POA: Diagnosis not present

## 2022-02-26 DIAGNOSIS — E785 Hyperlipidemia, unspecified: Secondary | ICD-10-CM | POA: Diagnosis not present

## 2022-02-26 DIAGNOSIS — E039 Hypothyroidism, unspecified: Secondary | ICD-10-CM | POA: Diagnosis not present

## 2022-02-27 DIAGNOSIS — W19XXXD Unspecified fall, subsequent encounter: Secondary | ICD-10-CM | POA: Diagnosis not present

## 2022-02-27 DIAGNOSIS — E1122 Type 2 diabetes mellitus with diabetic chronic kidney disease: Secondary | ICD-10-CM | POA: Diagnosis not present

## 2022-02-27 DIAGNOSIS — E785 Hyperlipidemia, unspecified: Secondary | ICD-10-CM | POA: Diagnosis not present

## 2022-02-27 DIAGNOSIS — Z96641 Presence of right artificial hip joint: Secondary | ICD-10-CM | POA: Diagnosis not present

## 2022-02-27 DIAGNOSIS — I129 Hypertensive chronic kidney disease with stage 1 through stage 4 chronic kidney disease, or unspecified chronic kidney disease: Secondary | ICD-10-CM | POA: Diagnosis not present

## 2022-02-27 DIAGNOSIS — S72001D Fracture of unspecified part of neck of right femur, subsequent encounter for closed fracture with routine healing: Secondary | ICD-10-CM | POA: Diagnosis not present

## 2022-03-04 DIAGNOSIS — Z96641 Presence of right artificial hip joint: Secondary | ICD-10-CM | POA: Diagnosis not present

## 2022-03-04 DIAGNOSIS — I129 Hypertensive chronic kidney disease with stage 1 through stage 4 chronic kidney disease, or unspecified chronic kidney disease: Secondary | ICD-10-CM | POA: Diagnosis not present

## 2022-03-04 DIAGNOSIS — E1122 Type 2 diabetes mellitus with diabetic chronic kidney disease: Secondary | ICD-10-CM | POA: Diagnosis not present

## 2022-03-04 DIAGNOSIS — E785 Hyperlipidemia, unspecified: Secondary | ICD-10-CM | POA: Diagnosis not present

## 2022-03-04 DIAGNOSIS — S72001D Fracture of unspecified part of neck of right femur, subsequent encounter for closed fracture with routine healing: Secondary | ICD-10-CM | POA: Diagnosis not present

## 2022-03-04 DIAGNOSIS — W19XXXD Unspecified fall, subsequent encounter: Secondary | ICD-10-CM | POA: Diagnosis not present

## 2022-03-06 DIAGNOSIS — S72001D Fracture of unspecified part of neck of right femur, subsequent encounter for closed fracture with routine healing: Secondary | ICD-10-CM | POA: Diagnosis not present

## 2022-03-06 DIAGNOSIS — I129 Hypertensive chronic kidney disease with stage 1 through stage 4 chronic kidney disease, or unspecified chronic kidney disease: Secondary | ICD-10-CM | POA: Diagnosis not present

## 2022-03-06 DIAGNOSIS — E1122 Type 2 diabetes mellitus with diabetic chronic kidney disease: Secondary | ICD-10-CM | POA: Diagnosis not present

## 2022-03-06 DIAGNOSIS — W19XXXD Unspecified fall, subsequent encounter: Secondary | ICD-10-CM | POA: Diagnosis not present

## 2022-03-06 DIAGNOSIS — E785 Hyperlipidemia, unspecified: Secondary | ICD-10-CM | POA: Diagnosis not present

## 2022-03-06 DIAGNOSIS — Z96641 Presence of right artificial hip joint: Secondary | ICD-10-CM | POA: Diagnosis not present

## 2022-03-07 DIAGNOSIS — E785 Hyperlipidemia, unspecified: Secondary | ICD-10-CM | POA: Diagnosis not present

## 2022-03-07 DIAGNOSIS — I129 Hypertensive chronic kidney disease with stage 1 through stage 4 chronic kidney disease, or unspecified chronic kidney disease: Secondary | ICD-10-CM | POA: Diagnosis not present

## 2022-03-07 DIAGNOSIS — E1122 Type 2 diabetes mellitus with diabetic chronic kidney disease: Secondary | ICD-10-CM | POA: Diagnosis not present

## 2022-03-07 DIAGNOSIS — S72001D Fracture of unspecified part of neck of right femur, subsequent encounter for closed fracture with routine healing: Secondary | ICD-10-CM | POA: Diagnosis not present

## 2022-03-07 DIAGNOSIS — W19XXXD Unspecified fall, subsequent encounter: Secondary | ICD-10-CM | POA: Diagnosis not present

## 2022-03-07 DIAGNOSIS — Z96641 Presence of right artificial hip joint: Secondary | ICD-10-CM | POA: Diagnosis not present

## 2022-03-11 DIAGNOSIS — W19XXXD Unspecified fall, subsequent encounter: Secondary | ICD-10-CM | POA: Diagnosis not present

## 2022-03-11 DIAGNOSIS — E1122 Type 2 diabetes mellitus with diabetic chronic kidney disease: Secondary | ICD-10-CM | POA: Diagnosis not present

## 2022-03-11 DIAGNOSIS — Z96641 Presence of right artificial hip joint: Secondary | ICD-10-CM | POA: Diagnosis not present

## 2022-03-11 DIAGNOSIS — E785 Hyperlipidemia, unspecified: Secondary | ICD-10-CM | POA: Diagnosis not present

## 2022-03-11 DIAGNOSIS — S72001D Fracture of unspecified part of neck of right femur, subsequent encounter for closed fracture with routine healing: Secondary | ICD-10-CM | POA: Diagnosis not present

## 2022-03-11 DIAGNOSIS — I129 Hypertensive chronic kidney disease with stage 1 through stage 4 chronic kidney disease, or unspecified chronic kidney disease: Secondary | ICD-10-CM | POA: Diagnosis not present

## 2022-03-12 DIAGNOSIS — I129 Hypertensive chronic kidney disease with stage 1 through stage 4 chronic kidney disease, or unspecified chronic kidney disease: Secondary | ICD-10-CM | POA: Diagnosis not present

## 2022-03-12 DIAGNOSIS — E1122 Type 2 diabetes mellitus with diabetic chronic kidney disease: Secondary | ICD-10-CM | POA: Diagnosis not present

## 2022-03-12 DIAGNOSIS — S72001D Fracture of unspecified part of neck of right femur, subsequent encounter for closed fracture with routine healing: Secondary | ICD-10-CM | POA: Diagnosis not present

## 2022-03-12 DIAGNOSIS — E785 Hyperlipidemia, unspecified: Secondary | ICD-10-CM | POA: Diagnosis not present

## 2022-03-12 DIAGNOSIS — Z96641 Presence of right artificial hip joint: Secondary | ICD-10-CM | POA: Diagnosis not present

## 2022-03-12 DIAGNOSIS — W19XXXD Unspecified fall, subsequent encounter: Secondary | ICD-10-CM | POA: Diagnosis not present

## 2022-03-13 DIAGNOSIS — Z96641 Presence of right artificial hip joint: Secondary | ICD-10-CM | POA: Diagnosis not present

## 2022-03-13 DIAGNOSIS — E1122 Type 2 diabetes mellitus with diabetic chronic kidney disease: Secondary | ICD-10-CM | POA: Diagnosis not present

## 2022-03-13 DIAGNOSIS — E785 Hyperlipidemia, unspecified: Secondary | ICD-10-CM | POA: Diagnosis not present

## 2022-03-13 DIAGNOSIS — W19XXXD Unspecified fall, subsequent encounter: Secondary | ICD-10-CM | POA: Diagnosis not present

## 2022-03-13 DIAGNOSIS — I129 Hypertensive chronic kidney disease with stage 1 through stage 4 chronic kidney disease, or unspecified chronic kidney disease: Secondary | ICD-10-CM | POA: Diagnosis not present

## 2022-03-13 DIAGNOSIS — S72001D Fracture of unspecified part of neck of right femur, subsequent encounter for closed fracture with routine healing: Secondary | ICD-10-CM | POA: Diagnosis not present

## 2022-03-18 DIAGNOSIS — E785 Hyperlipidemia, unspecified: Secondary | ICD-10-CM | POA: Diagnosis not present

## 2022-03-18 DIAGNOSIS — Z96641 Presence of right artificial hip joint: Secondary | ICD-10-CM | POA: Diagnosis not present

## 2022-03-18 DIAGNOSIS — E1122 Type 2 diabetes mellitus with diabetic chronic kidney disease: Secondary | ICD-10-CM | POA: Diagnosis not present

## 2022-03-18 DIAGNOSIS — W19XXXD Unspecified fall, subsequent encounter: Secondary | ICD-10-CM | POA: Diagnosis not present

## 2022-03-18 DIAGNOSIS — S72001D Fracture of unspecified part of neck of right femur, subsequent encounter for closed fracture with routine healing: Secondary | ICD-10-CM | POA: Diagnosis not present

## 2022-03-18 DIAGNOSIS — I129 Hypertensive chronic kidney disease with stage 1 through stage 4 chronic kidney disease, or unspecified chronic kidney disease: Secondary | ICD-10-CM | POA: Diagnosis not present

## 2022-03-19 DIAGNOSIS — I129 Hypertensive chronic kidney disease with stage 1 through stage 4 chronic kidney disease, or unspecified chronic kidney disease: Secondary | ICD-10-CM | POA: Diagnosis not present

## 2022-03-19 DIAGNOSIS — W19XXXD Unspecified fall, subsequent encounter: Secondary | ICD-10-CM | POA: Diagnosis not present

## 2022-03-19 DIAGNOSIS — Z471 Aftercare following joint replacement surgery: Secondary | ICD-10-CM | POA: Diagnosis not present

## 2022-03-19 DIAGNOSIS — E785 Hyperlipidemia, unspecified: Secondary | ICD-10-CM | POA: Diagnosis not present

## 2022-03-19 DIAGNOSIS — Z96641 Presence of right artificial hip joint: Secondary | ICD-10-CM | POA: Diagnosis not present

## 2022-03-19 DIAGNOSIS — E1122 Type 2 diabetes mellitus with diabetic chronic kidney disease: Secondary | ICD-10-CM | POA: Diagnosis not present

## 2022-03-19 DIAGNOSIS — S72001D Fracture of unspecified part of neck of right femur, subsequent encounter for closed fracture with routine healing: Secondary | ICD-10-CM | POA: Diagnosis not present

## 2022-03-20 DIAGNOSIS — W19XXXD Unspecified fall, subsequent encounter: Secondary | ICD-10-CM | POA: Diagnosis not present

## 2022-03-20 DIAGNOSIS — E785 Hyperlipidemia, unspecified: Secondary | ICD-10-CM | POA: Diagnosis not present

## 2022-03-20 DIAGNOSIS — I129 Hypertensive chronic kidney disease with stage 1 through stage 4 chronic kidney disease, or unspecified chronic kidney disease: Secondary | ICD-10-CM | POA: Diagnosis not present

## 2022-03-20 DIAGNOSIS — Z96641 Presence of right artificial hip joint: Secondary | ICD-10-CM | POA: Diagnosis not present

## 2022-03-20 DIAGNOSIS — S72001D Fracture of unspecified part of neck of right femur, subsequent encounter for closed fracture with routine healing: Secondary | ICD-10-CM | POA: Diagnosis not present

## 2022-03-20 DIAGNOSIS — E1122 Type 2 diabetes mellitus with diabetic chronic kidney disease: Secondary | ICD-10-CM | POA: Diagnosis not present

## 2022-03-21 DIAGNOSIS — W19XXXD Unspecified fall, subsequent encounter: Secondary | ICD-10-CM | POA: Diagnosis not present

## 2022-03-21 DIAGNOSIS — S72001D Fracture of unspecified part of neck of right femur, subsequent encounter for closed fracture with routine healing: Secondary | ICD-10-CM | POA: Diagnosis not present

## 2022-03-21 DIAGNOSIS — Z96641 Presence of right artificial hip joint: Secondary | ICD-10-CM | POA: Diagnosis not present

## 2022-03-21 DIAGNOSIS — I129 Hypertensive chronic kidney disease with stage 1 through stage 4 chronic kidney disease, or unspecified chronic kidney disease: Secondary | ICD-10-CM | POA: Diagnosis not present

## 2022-03-21 DIAGNOSIS — E785 Hyperlipidemia, unspecified: Secondary | ICD-10-CM | POA: Diagnosis not present

## 2022-03-21 DIAGNOSIS — E1122 Type 2 diabetes mellitus with diabetic chronic kidney disease: Secondary | ICD-10-CM | POA: Diagnosis not present

## 2022-03-24 DIAGNOSIS — Z96641 Presence of right artificial hip joint: Secondary | ICD-10-CM | POA: Diagnosis not present

## 2022-03-24 DIAGNOSIS — S72001D Fracture of unspecified part of neck of right femur, subsequent encounter for closed fracture with routine healing: Secondary | ICD-10-CM | POA: Diagnosis not present

## 2022-03-24 DIAGNOSIS — E1122 Type 2 diabetes mellitus with diabetic chronic kidney disease: Secondary | ICD-10-CM | POA: Diagnosis not present

## 2022-03-24 DIAGNOSIS — L89152 Pressure ulcer of sacral region, stage 2: Secondary | ICD-10-CM | POA: Diagnosis not present

## 2022-03-24 DIAGNOSIS — Z6837 Body mass index (BMI) 37.0-37.9, adult: Secondary | ICD-10-CM | POA: Diagnosis not present

## 2022-03-24 DIAGNOSIS — I129 Hypertensive chronic kidney disease with stage 1 through stage 4 chronic kidney disease, or unspecified chronic kidney disease: Secondary | ICD-10-CM | POA: Diagnosis not present

## 2022-03-24 DIAGNOSIS — W19XXXD Unspecified fall, subsequent encounter: Secondary | ICD-10-CM | POA: Diagnosis not present

## 2022-03-24 DIAGNOSIS — E785 Hyperlipidemia, unspecified: Secondary | ICD-10-CM | POA: Diagnosis not present

## 2022-03-24 DIAGNOSIS — J301 Allergic rhinitis due to pollen: Secondary | ICD-10-CM | POA: Diagnosis not present

## 2022-03-26 DIAGNOSIS — W19XXXD Unspecified fall, subsequent encounter: Secondary | ICD-10-CM | POA: Diagnosis not present

## 2022-03-26 DIAGNOSIS — S72001D Fracture of unspecified part of neck of right femur, subsequent encounter for closed fracture with routine healing: Secondary | ICD-10-CM | POA: Diagnosis not present

## 2022-03-26 DIAGNOSIS — H353113 Nonexudative age-related macular degeneration, right eye, advanced atrophic without subfoveal involvement: Secondary | ICD-10-CM | POA: Diagnosis not present

## 2022-03-26 DIAGNOSIS — Z96641 Presence of right artificial hip joint: Secondary | ICD-10-CM | POA: Diagnosis not present

## 2022-03-26 DIAGNOSIS — H43811 Vitreous degeneration, right eye: Secondary | ICD-10-CM | POA: Diagnosis not present

## 2022-03-26 DIAGNOSIS — E1122 Type 2 diabetes mellitus with diabetic chronic kidney disease: Secondary | ICD-10-CM | POA: Diagnosis not present

## 2022-03-26 DIAGNOSIS — I129 Hypertensive chronic kidney disease with stage 1 through stage 4 chronic kidney disease, or unspecified chronic kidney disease: Secondary | ICD-10-CM | POA: Diagnosis not present

## 2022-03-26 DIAGNOSIS — H353124 Nonexudative age-related macular degeneration, left eye, advanced atrophic with subfoveal involvement: Secondary | ICD-10-CM | POA: Diagnosis not present

## 2022-03-26 DIAGNOSIS — H259 Unspecified age-related cataract: Secondary | ICD-10-CM | POA: Diagnosis not present

## 2022-03-26 DIAGNOSIS — E785 Hyperlipidemia, unspecified: Secondary | ICD-10-CM | POA: Diagnosis not present

## 2022-03-28 DIAGNOSIS — K219 Gastro-esophageal reflux disease without esophagitis: Secondary | ICD-10-CM | POA: Diagnosis not present

## 2022-03-28 DIAGNOSIS — E1122 Type 2 diabetes mellitus with diabetic chronic kidney disease: Secondary | ICD-10-CM | POA: Diagnosis not present

## 2022-03-28 DIAGNOSIS — I4891 Unspecified atrial fibrillation: Secondary | ICD-10-CM | POA: Diagnosis not present

## 2022-03-28 DIAGNOSIS — E039 Hypothyroidism, unspecified: Secondary | ICD-10-CM | POA: Diagnosis not present

## 2022-03-28 DIAGNOSIS — Z9181 History of falling: Secondary | ICD-10-CM | POA: Diagnosis not present

## 2022-03-28 DIAGNOSIS — I129 Hypertensive chronic kidney disease with stage 1 through stage 4 chronic kidney disease, or unspecified chronic kidney disease: Secondary | ICD-10-CM | POA: Diagnosis not present

## 2022-03-28 DIAGNOSIS — F419 Anxiety disorder, unspecified: Secondary | ICD-10-CM | POA: Diagnosis not present

## 2022-03-28 DIAGNOSIS — D509 Iron deficiency anemia, unspecified: Secondary | ICD-10-CM | POA: Diagnosis not present

## 2022-03-28 DIAGNOSIS — N1831 Chronic kidney disease, stage 3a: Secondary | ICD-10-CM | POA: Diagnosis not present

## 2022-03-28 DIAGNOSIS — E785 Hyperlipidemia, unspecified: Secondary | ICD-10-CM | POA: Diagnosis not present

## 2022-03-28 DIAGNOSIS — Z7901 Long term (current) use of anticoagulants: Secondary | ICD-10-CM | POA: Diagnosis not present

## 2022-03-28 DIAGNOSIS — Z6836 Body mass index (BMI) 36.0-36.9, adult: Secondary | ICD-10-CM | POA: Diagnosis not present

## 2022-03-28 DIAGNOSIS — Z7984 Long term (current) use of oral hypoglycemic drugs: Secondary | ICD-10-CM | POA: Diagnosis not present

## 2022-03-28 DIAGNOSIS — Z87891 Personal history of nicotine dependence: Secondary | ICD-10-CM | POA: Diagnosis not present

## 2022-03-28 DIAGNOSIS — J439 Emphysema, unspecified: Secondary | ICD-10-CM | POA: Diagnosis not present

## 2022-03-28 DIAGNOSIS — Z96641 Presence of right artificial hip joint: Secondary | ICD-10-CM | POA: Diagnosis not present

## 2022-03-28 DIAGNOSIS — E669 Obesity, unspecified: Secondary | ICD-10-CM | POA: Diagnosis not present

## 2022-03-28 DIAGNOSIS — W19XXXD Unspecified fall, subsequent encounter: Secondary | ICD-10-CM | POA: Diagnosis not present

## 2022-03-28 DIAGNOSIS — S72001D Fracture of unspecified part of neck of right femur, subsequent encounter for closed fracture with routine healing: Secondary | ICD-10-CM | POA: Diagnosis not present

## 2022-04-02 DIAGNOSIS — E1122 Type 2 diabetes mellitus with diabetic chronic kidney disease: Secondary | ICD-10-CM | POA: Diagnosis not present

## 2022-04-02 DIAGNOSIS — W19XXXD Unspecified fall, subsequent encounter: Secondary | ICD-10-CM | POA: Diagnosis not present

## 2022-04-02 DIAGNOSIS — I129 Hypertensive chronic kidney disease with stage 1 through stage 4 chronic kidney disease, or unspecified chronic kidney disease: Secondary | ICD-10-CM | POA: Diagnosis not present

## 2022-04-02 DIAGNOSIS — Z96641 Presence of right artificial hip joint: Secondary | ICD-10-CM | POA: Diagnosis not present

## 2022-04-02 DIAGNOSIS — E785 Hyperlipidemia, unspecified: Secondary | ICD-10-CM | POA: Diagnosis not present

## 2022-04-02 DIAGNOSIS — S72001D Fracture of unspecified part of neck of right femur, subsequent encounter for closed fracture with routine healing: Secondary | ICD-10-CM | POA: Diagnosis not present

## 2022-04-03 DIAGNOSIS — E782 Mixed hyperlipidemia: Secondary | ICD-10-CM | POA: Diagnosis not present

## 2022-04-03 DIAGNOSIS — R809 Proteinuria, unspecified: Secondary | ICD-10-CM | POA: Diagnosis not present

## 2022-04-03 DIAGNOSIS — E1122 Type 2 diabetes mellitus with diabetic chronic kidney disease: Secondary | ICD-10-CM | POA: Diagnosis not present

## 2022-04-03 DIAGNOSIS — E7849 Other hyperlipidemia: Secondary | ICD-10-CM | POA: Diagnosis not present

## 2022-04-03 DIAGNOSIS — R300 Dysuria: Secondary | ICD-10-CM | POA: Diagnosis not present

## 2022-04-03 DIAGNOSIS — N183 Chronic kidney disease, stage 3 unspecified: Secondary | ICD-10-CM | POA: Diagnosis not present

## 2022-04-04 DIAGNOSIS — E1122 Type 2 diabetes mellitus with diabetic chronic kidney disease: Secondary | ICD-10-CM | POA: Diagnosis not present

## 2022-04-04 DIAGNOSIS — S72001D Fracture of unspecified part of neck of right femur, subsequent encounter for closed fracture with routine healing: Secondary | ICD-10-CM | POA: Diagnosis not present

## 2022-04-04 DIAGNOSIS — E785 Hyperlipidemia, unspecified: Secondary | ICD-10-CM | POA: Diagnosis not present

## 2022-04-04 DIAGNOSIS — Z96641 Presence of right artificial hip joint: Secondary | ICD-10-CM | POA: Diagnosis not present

## 2022-04-04 DIAGNOSIS — W19XXXD Unspecified fall, subsequent encounter: Secondary | ICD-10-CM | POA: Diagnosis not present

## 2022-04-04 DIAGNOSIS — I129 Hypertensive chronic kidney disease with stage 1 through stage 4 chronic kidney disease, or unspecified chronic kidney disease: Secondary | ICD-10-CM | POA: Diagnosis not present

## 2022-04-08 DIAGNOSIS — I5032 Chronic diastolic (congestive) heart failure: Secondary | ICD-10-CM | POA: Diagnosis not present

## 2022-04-08 DIAGNOSIS — K7581 Nonalcoholic steatohepatitis (NASH): Secondary | ICD-10-CM | POA: Diagnosis not present

## 2022-04-08 DIAGNOSIS — J9611 Chronic respiratory failure with hypoxia: Secondary | ICD-10-CM | POA: Diagnosis not present

## 2022-04-08 DIAGNOSIS — E7849 Other hyperlipidemia: Secondary | ICD-10-CM | POA: Diagnosis not present

## 2022-04-08 DIAGNOSIS — R809 Proteinuria, unspecified: Secondary | ICD-10-CM | POA: Diagnosis not present

## 2022-04-08 DIAGNOSIS — J449 Chronic obstructive pulmonary disease, unspecified: Secondary | ICD-10-CM | POA: Diagnosis not present

## 2022-04-08 DIAGNOSIS — F331 Major depressive disorder, recurrent, moderate: Secondary | ICD-10-CM | POA: Diagnosis not present

## 2022-04-08 DIAGNOSIS — E1122 Type 2 diabetes mellitus with diabetic chronic kidney disease: Secondary | ICD-10-CM | POA: Diagnosis not present

## 2022-04-08 DIAGNOSIS — I4891 Unspecified atrial fibrillation: Secondary | ICD-10-CM | POA: Diagnosis not present

## 2022-04-08 DIAGNOSIS — E039 Hypothyroidism, unspecified: Secondary | ICD-10-CM | POA: Diagnosis not present

## 2022-04-08 DIAGNOSIS — I10 Essential (primary) hypertension: Secondary | ICD-10-CM | POA: Diagnosis not present

## 2022-04-11 DIAGNOSIS — I129 Hypertensive chronic kidney disease with stage 1 through stage 4 chronic kidney disease, or unspecified chronic kidney disease: Secondary | ICD-10-CM | POA: Diagnosis not present

## 2022-04-11 DIAGNOSIS — S72001D Fracture of unspecified part of neck of right femur, subsequent encounter for closed fracture with routine healing: Secondary | ICD-10-CM | POA: Diagnosis not present

## 2022-04-11 DIAGNOSIS — E1122 Type 2 diabetes mellitus with diabetic chronic kidney disease: Secondary | ICD-10-CM | POA: Diagnosis not present

## 2022-04-11 DIAGNOSIS — Z96641 Presence of right artificial hip joint: Secondary | ICD-10-CM | POA: Diagnosis not present

## 2022-04-11 DIAGNOSIS — E785 Hyperlipidemia, unspecified: Secondary | ICD-10-CM | POA: Diagnosis not present

## 2022-04-11 DIAGNOSIS — W19XXXD Unspecified fall, subsequent encounter: Secondary | ICD-10-CM | POA: Diagnosis not present

## 2022-04-15 DIAGNOSIS — R300 Dysuria: Secondary | ICD-10-CM | POA: Diagnosis not present

## 2022-04-16 DIAGNOSIS — E1122 Type 2 diabetes mellitus with diabetic chronic kidney disease: Secondary | ICD-10-CM | POA: Diagnosis not present

## 2022-04-16 DIAGNOSIS — E785 Hyperlipidemia, unspecified: Secondary | ICD-10-CM | POA: Diagnosis not present

## 2022-04-16 DIAGNOSIS — W19XXXD Unspecified fall, subsequent encounter: Secondary | ICD-10-CM | POA: Diagnosis not present

## 2022-04-16 DIAGNOSIS — Z96641 Presence of right artificial hip joint: Secondary | ICD-10-CM | POA: Diagnosis not present

## 2022-04-16 DIAGNOSIS — S72001D Fracture of unspecified part of neck of right femur, subsequent encounter for closed fracture with routine healing: Secondary | ICD-10-CM | POA: Diagnosis not present

## 2022-04-16 DIAGNOSIS — I129 Hypertensive chronic kidney disease with stage 1 through stage 4 chronic kidney disease, or unspecified chronic kidney disease: Secondary | ICD-10-CM | POA: Diagnosis not present

## 2022-04-24 DIAGNOSIS — Z5189 Encounter for other specified aftercare: Secondary | ICD-10-CM | POA: Diagnosis not present

## 2022-05-08 ENCOUNTER — Encounter: Payer: Self-pay | Admitting: Radiology

## 2022-05-21 DIAGNOSIS — H43811 Vitreous degeneration, right eye: Secondary | ICD-10-CM | POA: Diagnosis not present

## 2022-05-21 DIAGNOSIS — H353113 Nonexudative age-related macular degeneration, right eye, advanced atrophic without subfoveal involvement: Secondary | ICD-10-CM | POA: Diagnosis not present

## 2022-05-21 DIAGNOSIS — H259 Unspecified age-related cataract: Secondary | ICD-10-CM | POA: Diagnosis not present

## 2022-05-21 DIAGNOSIS — H353124 Nonexudative age-related macular degeneration, left eye, advanced atrophic with subfoveal involvement: Secondary | ICD-10-CM | POA: Diagnosis not present

## 2022-05-26 DIAGNOSIS — Z20828 Contact with and (suspected) exposure to other viral communicable diseases: Secondary | ICD-10-CM | POA: Diagnosis not present

## 2022-05-26 DIAGNOSIS — R030 Elevated blood-pressure reading, without diagnosis of hypertension: Secondary | ICD-10-CM | POA: Diagnosis not present

## 2022-05-26 DIAGNOSIS — Z6836 Body mass index (BMI) 36.0-36.9, adult: Secondary | ICD-10-CM | POA: Diagnosis not present

## 2022-05-26 DIAGNOSIS — J101 Influenza due to other identified influenza virus with other respiratory manifestations: Secondary | ICD-10-CM | POA: Diagnosis not present

## 2022-06-10 ENCOUNTER — Encounter: Payer: Self-pay | Admitting: Cardiology

## 2022-06-10 ENCOUNTER — Ambulatory Visit: Payer: Medicare Other | Attending: Cardiology | Admitting: Cardiology

## 2022-06-10 VITALS — BP 150/90 | HR 96 | Ht 65.0 in | Wt 221.2 lb

## 2022-06-10 DIAGNOSIS — E782 Mixed hyperlipidemia: Secondary | ICD-10-CM | POA: Diagnosis not present

## 2022-06-10 DIAGNOSIS — I4821 Permanent atrial fibrillation: Secondary | ICD-10-CM

## 2022-06-10 DIAGNOSIS — I10 Essential (primary) hypertension: Secondary | ICD-10-CM | POA: Diagnosis not present

## 2022-06-10 MED ORDER — LISINOPRIL 10 MG PO TABS: 10.0000 mg | ORAL_TABLET | Freq: Every day | ORAL | 3 refills | Status: DC

## 2022-06-10 NOTE — Patient Instructions (Signed)
Medication Instructions:  Begin Lisinopril 10mg  daily  Continue all other medications.     Labwork: none  Testing/Procedures: none  Follow-Up: 6 months   Any Other Special Instructions Will Be Listed Below (If Applicable). 2 weeks - nurse visit for blood pressure check   If you need a refill on your cardiac medications before your next appointment, please call your pharmacy.

## 2022-06-10 NOTE — Progress Notes (Signed)
Clinical Summary Ms. Fransisca Connors is a 87 y.o.female seen today for follow up of the following medical problems.    1. Permanent afib - afib noted by 01/26/2019 preop ekg - issues with afib with RVR during 10/2019 clinic visit, referred to [place] clinic - admitted 10/24/19 for tikosyn initiation. Loaded with attempted cardioversion 10/27/19 but failed - at EP f/u 8/26 plans were for repeat cardioversion, if fails would consider amiodarone (minimal COPD according to pulm note). Appears admitted with anemia prior to cardioversion, EKG during that admission 11/04/19 showed NSR - dilt stopped 10/27/19 after tikosyn loading, remained on bisoprolol      - admission 12/2019 with COPD exacerbation and UTI, issues with afib with RVR.  - tikosyn was stopped due to labile renal function, Cr up to 1.9 on admission  - seen by EP. Poor ablation candidate. Started on amio that visit   - from last EP note failed amio, deemed as having permanent afib with plans for just rate control.       - occasional palpitaitons.About 2 times a week, lasts 5-10 minutes.  -home HRs 60s, at times 110s.  - no bleeding on eliquis  - rare palpitations, overall mild - compliant with meds, no bleeding on eliquis.        2. Anemia - admitted 10/2019 with symptomatic anemia. Hgb down to 6.5, given 1 unit prbcs - has not been a recurrent issue     3. Hard of hearing   4. Chronic diastolic HF - no recent edema.    5. HTN - off HCTZ and lisinopril since 01/2022 admission with hip fracture and surgery    6. Hyperlipidemia 11/2020 TC 137 TG 250 HDL 54 LDL 44 - reports labs in Jan with pcp   7. Hip fracture - admission 01/2022 with mechanical fall, hip fracture.  - had surgery during admit   Past Medical History:  Diagnosis Date   Allergy    seasonal allergies   Anemia    IDA- on iron supplement   Anxiety    Atrial fibrillation    Blood transfusion without reported diagnosis    2021- due to IDA/ low hemo  (7.3)   Cancer 1976   bilaterl thyroid ca   Diabetes mellitus without complication    on meds   GERD (gastroesophageal reflux disease)    on meds---OTC   Headache(784.0)    hx migraines   Hyperlipidemia    on meds   Hypertension    on meds   Hypothyroidism    thyroidectomy in 1976- on meds at  this time   On supplemental oxygen therapy    2 L at night   Shortness of breath    Sinus congestion    Sleep apnea    study done in [place], [place], use CPAP----with home O2 concentrater at night with CPAP     No Known Allergies   Current Outpatient Medications  Medication Sig Dispense Refill   acetaminophen (TYLENOL) 500 MG tablet Take 500 mg by mouth every 6 (six) hours as needed (for pain.).     bisoprolol (ZEBETA) 10 MG tablet Take 2 tablets (20 mg total) by mouth daily. 180 tablet 3   Cholecalciferol (VITAMIN D3) 50 MCG (2000 UT) capsule Take 2,000 Units by mouth daily.     ELIQUIS 5 MG TABS tablet Take 1 tablet (5 mg total) by mouth 2 (two) times daily. 180 tablet 1   GUAIFENESIN 1200 PO Take 1,200 mg by mouth 2 (  two) times daily as needed (sinus congestion.).     levothyroxine (SYNTHROID) 112 MCG tablet Take 112 mcg by mouth daily before breakfast.      loratadine (CLARITIN) 10 MG tablet Take 1 tablet by mouth daily as needed for allergies.     melatonin 5 MG TABS Take 2.5-5 mg by mouth at bedtime as needed (sleep).      Multiple Vitamins-Minerals (MULTIVITAMIN WITH MINERALS) tablet Take 1 tablet by mouth daily.     Multiple Vitamins-Minerals (PRESERVISION AREDS) TABS Take 1 tablet by mouth daily.     omeprazole (PRILOSEC) 20 MG capsule Take 20 mg by mouth daily as needed (acid reflux/indigestion.).     Pegcetacoplan, Ophthalmic, 15 MG/0.1ML SOLN Injection in right eye every two months     simvastatin (ZOCOR) 40 MG tablet Take 20 mg by mouth daily with supper.      triamcinolone cream (KENALOG) 0.1 % Apply 1 application topically 2 (two) times daily as needed (psoriasis).      metFORMIN (GLUCOPHAGE-XR) 500 MG 24 hr tablet Take 1,000 mg by mouth daily with supper. (Patient not taking: Reported on 06/10/2022)     No current facility-administered medications for this visit.     Past Surgical History:  Procedure Laterality Date   APPENDECTOMY     CARDIOVERSION N/A 10/27/2019   Procedure: CARDIOVERSION;  Surgeon: [name], MD;  Location: [facility];  Service: Cardiovascular;  Laterality: N/A;   CARDIOVERSION N/A 04/25/2020   Procedure: CARDIOVERSION;  Surgeon: Freada Bergeron, MD;  Location: [facility] ENDOSCOPY;  Service: Cardiovascular;  Laterality: N/A;   CHOLECYSTECTOMY     COLONOSCOPY WITH PROPOFOL N/A 01/09/2020   Procedure: COLONOSCOPY WITH PROPOFOL;  Surgeon: Doran Stabler, MD;  Location: [facility] ENDOSCOPY;  Service: Gastroenterology;  Laterality: N/A;   ESOPHAGOGASTRODUODENOSCOPY (EGD) WITH PROPOFOL N/A 01/09/2020   Procedure: ESOPHAGOGASTRODUODENOSCOPY (EGD) WITH PROPOFOL;  Surgeon: Doran Stabler, MD;  Location: [facility] ENDOSCOPY;  Service: Gastroenterology;  Laterality: N/A;   HEMOSTASIS CLIP PLACEMENT  01/09/2020   Procedure: HEMOSTASIS CLIP PLACEMENT;  Surgeon: Doran Stabler, MD;  Location: [facility] ENDOSCOPY;  Service: Gastroenterology;;   OPEN REDUCTION INTERNAL FIXATION (ORIF) TIBIA/FIBULA FRACTURE Right 11/03/2013   Procedure: OPEN REDUCTION INTERNAL FIXATION (ORIF) RIGHT TIBIA/FIBULA PILON FRACTURE;  Surgeon: Wylene Simmer, MD;  Location: [facility];  Service: Orthopedics;  Laterality: Right;   OVARIAN CYST SURGERY Left    age 57   POLYPECTOMY  01/09/2020   Procedure: POLYPECTOMY;  Surgeon: Doran Stabler, MD;  Location: [facility];  Service: Gastroenterology;;   THYROIDECTOMY Bilateral 1976   TOTAL HIP ARTHROPLASTY Right 01/30/2022   Procedure: TOTAL HIP ARTHROPLASTY ANTERIOR APPROACH;  Surgeon: Paralee Cancel, MD;  Location: [facility];  Service: Orthopedics;  Laterality: Right;   TUBAL LIGATION       No Known Allergies    Family History   Problem Relation Age of Onset   Stroke Mother        brain hemmorhage   Diabetes Mother    Heart disease Mother    Thyroid disease Mother    Arrhythmia Mother    Stroke Father    Heart attack Father        blood clot in brain   Stroke Sister    Cancer Sister    Stomach cancer Brother 45   Hypertension Brother    Esophageal cancer Brother 52   Stroke Brother    Hypertension Brother    Arrhythmia Sister    Hypertension Sister  Colon polyps Neg Hx    Colon cancer Neg Hx    Rectal cancer Neg Hx      Social History [name] reports that she quit smoking about 10 years ago. Her smoking use included cigarettes. She has a 90.00 pack-year smoking history. She has never been exposed to tobacco smoke. She has never used smokeless tobacco. Ms. Fransisca Connors reports no history of alcohol use.   Review of Systems CONSTITUTIONAL: No weight loss, fever, chills, weakness or fatigue.  HEENT: Eyes: No visual loss, blurred vision, double vision or yellow sclerae.No hearing loss, sneezing, congestion, runny nose or sore throat.  SKIN: No rash or itching.  CARDIOVASCULAR: RRR, no mrg, no jvd RESPIRATORY: No shortness of breath, cough or sputum.  GASTROINTESTINAL: No anorexia, nausea, vomiting or diarrhea. No abdominal pain or blood.  GENITOURINARY: No burning on urination, no polyuria NEUROLOGICAL: No headache, dizziness, syncope, paralysis, ataxia, numbness or tingling in the extremities. No change in bowel or bladder control.  MUSCULOSKELETAL: No muscle, back pain, joint pain or stiffness.  LYMPHATICS: No enlarged nodes. No history of splenectomy.  PSYCHIATRIC: No history of depression or anxiety.  ENDOCRINOLOGIC: No reports of sweating, cold or heat intolerance. No polyuria or polydipsia.  Marland Kitchen   Physical Examination Today's Vitals   06/10/22 1301 06/10/22 1330  BP: (!) 160/90 (!) 150/90  Pulse: 96   SpO2: 96%   Weight: 221 lb 3.2 oz (100.3 kg)   Height: 5\' 5"  (1.651 m)    Body mass  index is 36.81 kg/m.  Gen: resting comfortably, no acute distress HEENT: no scleral icterus, pupils equal round and reactive, no palptable cervical adenopathy,  CV: irreg, no m/rg, no jvd Resp: Clear to auscultation bilaterally GI: abdomen is soft, non-tender, non-distended, normal bowel sounds, no hepatosplenomegaly MSK: extremities are warm, no edema.  Skin: warm, no rash Neuro:  no focal deficits Psych: appropriate affect   Diagnostic Studies  02/2019 echo IMPRESSIONS     1. Left ventricular ejection fraction, by visual estimation, is 70 to  75%. The left ventricle has hyperdynamic function. There is no left  ventricular hypertrophy.   2. Left ventricular diastolic parameters are indeterminate.   3. The left ventricle has no regional wall motion abnormalities.   4. Global right ventricle has normal systolic function.The right  ventricular size is normal. No increase in right ventricular wall  thickness.   5. Left atrial size was normal.   6. Right atrial size was normal.   7. The mitral valve is normal in structure. Trivial mitral valve  regurgitation. No evidence of mitral stenosis.   8. The tricuspid valve is normal in structure. Tricuspid valve  regurgitation is not demonstrated.   9. The aortic valve has an indeterminant number of cusps. Aortic valve  regurgitation is not visualized. Mild aortic valve sclerosis without  stenosis.  10. The pulmonic valve was not well visualized. Pulmonic valve  regurgitation is not visualized.  11. Normal pulmonary artery systolic pressure.    Assessment and Plan    1. Permanent afib - off tikosyn after recent admission with AKI. Failed amio. Deemed poor ablation candidate -doing well with rate control alone, continue current meds - EKG shows afib in the 90s today    2. HTN - off HCTZ and lisinopril since 01/2022 admision with hip fractrure - bp's trending back up, restart lisinopril 10mg  daily.  - nursing visiti 2 weeks for  bp check     3. Hyperlipidemia --continue current meds, request pcp labs  F/u 6 months    Arnoldo Lenis, M.D.

## 2022-06-11 ENCOUNTER — Telehealth: Payer: Self-pay | Admitting: Cardiology

## 2022-06-11 NOTE — Telephone Encounter (Signed)
Patient's daughter states they have a conflict and need to reschedule 4/16 nurse visit.

## 2022-06-11 NOTE — Telephone Encounter (Signed)
Nurse visit rescheduled for Thursday, 4/18 @ 9:00 am.

## 2022-06-13 ENCOUNTER — Encounter: Payer: Self-pay | Admitting: *Deleted

## 2022-06-13 ENCOUNTER — Encounter: Payer: Self-pay | Admitting: Cardiology

## 2022-06-24 ENCOUNTER — Ambulatory Visit: Payer: Medicare Other

## 2022-06-26 ENCOUNTER — Ambulatory Visit: Payer: Medicare Other | Attending: Internal Medicine | Admitting: *Deleted

## 2022-06-26 ENCOUNTER — Encounter: Payer: Self-pay | Admitting: *Deleted

## 2022-06-26 ENCOUNTER — Telehealth: Payer: Self-pay | Admitting: Cardiology

## 2022-06-26 VITALS — BP 160/93 | HR 86 | Ht 65.0 in | Wt 217.4 lb

## 2022-06-26 DIAGNOSIS — I10 Essential (primary) hypertension: Secondary | ICD-10-CM | POA: Diagnosis not present

## 2022-06-26 DIAGNOSIS — Z79899 Other long term (current) drug therapy: Secondary | ICD-10-CM | POA: Insufficient documentation

## 2022-06-26 MED ORDER — LISINOPRIL 20 MG PO TABS: 20.0000 mg | ORAL_TABLET | Freq: Every day | ORAL | 3 refills | Status: DC

## 2022-06-26 NOTE — Progress Notes (Signed)
Presents to office for nurse visit for BP recheck per last OV: 2. HTN - off HCTZ and lisinopril since 01/2022 admision with hip fractrure - bp's trending back up, restart lisinopril  daily.  - nursing visiti 2 weeks for bp check Reports taking all medications as prescribed without missing doses. Denies side effects from medications. Denies dizziness, headache, blurred vision, chest pain or SOB.  Medications reviewed. Vitals taken and routed to provider for review.

## 2022-06-26 NOTE — Telephone Encounter (Signed)
Contacted daughter and discussed plan from today's nurse visit. See nurse visit for details.

## 2022-06-26 NOTE — Progress Notes (Signed)
Per Dr. Wyline Mood bp too high, increase lisinopril to  daily. needs bmet [redacted] weeks along with repeat nursing visit for bp check   Left message for patient to call office.  Patient informed and verbalized understanding of plan. Lab order faxed [facility]

## 2022-06-26 NOTE — Patient Instructions (Signed)
Your physician recommends that you continue on your current medications as directed. Please refer to the Current Medication list given to you today. We will contact you with any medication changes after your provider reviews your visit

## 2022-06-26 NOTE — Telephone Encounter (Signed)
Patient's daughter Luster Landsberg is returning RN's call. Please advise.

## 2022-07-09 DIAGNOSIS — R0600 Dyspnea, unspecified: Secondary | ICD-10-CM | POA: Diagnosis not present

## 2022-07-10 ENCOUNTER — Ambulatory Visit: Payer: Medicare Other | Attending: Cardiology | Admitting: *Deleted

## 2022-07-10 ENCOUNTER — Encounter: Payer: Self-pay | Admitting: *Deleted

## 2022-07-10 NOTE — Progress Notes (Signed)
Patient in office for nurse BP check.  Lisinopril recently increased to 20mg  daily.  States she did take her medication already this morning prior to visit.

## 2022-07-10 NOTE — Progress Notes (Signed)
BP too high, please increase lisinopril again to 40mg  daily, repeat bp check 2 weeks  Dominga Ferry MD

## 2022-07-10 NOTE — Progress Notes (Signed)
Will send message to patient via mychart.

## 2022-07-14 ENCOUNTER — Telehealth: Payer: Self-pay | Admitting: Cardiology

## 2022-07-14 NOTE — Telephone Encounter (Signed)
Patient is calling about her mother's lab work, she called stating her mother's creatinine.  She wants to know if her medication is going to be adjusted. Please advise.

## 2022-07-14 NOTE — Telephone Encounter (Signed)
Lesle Chris, LPN 03/15/1094  0:45 PM EDT Back to Top    Daughter Luster Landsberg) notified, copy to pcp.   Antoine Poche, MD 07/10/2022 12:31 PM EDT     Labs look fine.   Dominga Ferry MD

## 2022-07-21 DIAGNOSIS — D500 Iron deficiency anemia secondary to blood loss (chronic): Secondary | ICD-10-CM | POA: Diagnosis not present

## 2022-07-21 DIAGNOSIS — E1122 Type 2 diabetes mellitus with diabetic chronic kidney disease: Secondary | ICD-10-CM | POA: Diagnosis not present

## 2022-07-21 DIAGNOSIS — E039 Hypothyroidism, unspecified: Secondary | ICD-10-CM | POA: Diagnosis not present

## 2022-07-21 DIAGNOSIS — E782 Mixed hyperlipidemia: Secondary | ICD-10-CM | POA: Diagnosis not present

## 2022-07-21 DIAGNOSIS — R809 Proteinuria, unspecified: Secondary | ICD-10-CM | POA: Diagnosis not present

## 2022-07-21 DIAGNOSIS — E559 Vitamin D deficiency, unspecified: Secondary | ICD-10-CM | POA: Diagnosis not present

## 2022-07-21 DIAGNOSIS — N183 Chronic kidney disease, stage 3 unspecified: Secondary | ICD-10-CM | POA: Diagnosis not present

## 2022-07-21 DIAGNOSIS — E7849 Other hyperlipidemia: Secondary | ICD-10-CM | POA: Diagnosis not present

## 2022-07-23 DIAGNOSIS — H353124 Nonexudative age-related macular degeneration, left eye, advanced atrophic with subfoveal involvement: Secondary | ICD-10-CM | POA: Diagnosis not present

## 2022-07-23 DIAGNOSIS — H353113 Nonexudative age-related macular degeneration, right eye, advanced atrophic without subfoveal involvement: Secondary | ICD-10-CM | POA: Diagnosis not present

## 2022-07-23 DIAGNOSIS — H43811 Vitreous degeneration, right eye: Secondary | ICD-10-CM | POA: Diagnosis not present

## 2022-07-23 DIAGNOSIS — H259 Unspecified age-related cataract: Secondary | ICD-10-CM | POA: Diagnosis not present

## 2022-07-24 ENCOUNTER — Ambulatory Visit: Payer: Medicare Other

## 2022-07-24 DIAGNOSIS — H6123 Impacted cerumen, bilateral: Secondary | ICD-10-CM | POA: Diagnosis not present

## 2022-07-24 DIAGNOSIS — Z974 Presence of external hearing-aid: Secondary | ICD-10-CM | POA: Diagnosis not present

## 2022-07-24 DIAGNOSIS — Z461 Encounter for fitting and adjustment of hearing aid: Secondary | ICD-10-CM | POA: Diagnosis not present

## 2022-07-24 DIAGNOSIS — H903 Sensorineural hearing loss, bilateral: Secondary | ICD-10-CM | POA: Diagnosis not present

## 2022-07-28 ENCOUNTER — Ambulatory Visit: Payer: Medicare Other | Attending: Cardiology | Admitting: *Deleted

## 2022-07-28 DIAGNOSIS — R809 Proteinuria, unspecified: Secondary | ICD-10-CM | POA: Diagnosis not present

## 2022-07-28 DIAGNOSIS — I10 Essential (primary) hypertension: Secondary | ICD-10-CM | POA: Diagnosis not present

## 2022-07-28 DIAGNOSIS — J9611 Chronic respiratory failure with hypoxia: Secondary | ICD-10-CM | POA: Diagnosis not present

## 2022-07-28 DIAGNOSIS — E1122 Type 2 diabetes mellitus with diabetic chronic kidney disease: Secondary | ICD-10-CM | POA: Diagnosis not present

## 2022-07-28 DIAGNOSIS — F331 Major depressive disorder, recurrent, moderate: Secondary | ICD-10-CM | POA: Diagnosis not present

## 2022-07-28 DIAGNOSIS — Z0001 Encounter for general adult medical examination with abnormal findings: Secondary | ICD-10-CM | POA: Diagnosis not present

## 2022-07-28 DIAGNOSIS — J449 Chronic obstructive pulmonary disease, unspecified: Secondary | ICD-10-CM | POA: Diagnosis not present

## 2022-07-28 DIAGNOSIS — E7849 Other hyperlipidemia: Secondary | ICD-10-CM | POA: Diagnosis not present

## 2022-07-28 DIAGNOSIS — I5032 Chronic diastolic (congestive) heart failure: Secondary | ICD-10-CM | POA: Diagnosis not present

## 2022-07-28 DIAGNOSIS — I4891 Unspecified atrial fibrillation: Secondary | ICD-10-CM | POA: Diagnosis not present

## 2022-07-28 DIAGNOSIS — E039 Hypothyroidism, unspecified: Secondary | ICD-10-CM | POA: Diagnosis not present

## 2022-07-28 NOTE — Progress Notes (Signed)
Patient in office for nurse BP check.    138/84  88  States she has taken her meds prior to visit.   Recent increase on her Lisinopril to 40mg  daily.

## 2022-07-29 ENCOUNTER — Encounter: Payer: Self-pay | Admitting: *Deleted

## 2022-07-29 NOTE — Progress Notes (Signed)
Notified via mychart.

## 2022-07-29 NOTE — Progress Notes (Signed)
BP mildly elevated but improved, would monitor for now. If remains elevated at regular f/u may consider adding back additional medication. With her advanced age do not want to add medications too quickly due to side effect risk   Dominga Ferry MD

## 2022-08-11 ENCOUNTER — Other Ambulatory Visit: Payer: Self-pay | Admitting: *Deleted

## 2022-08-11 MED ORDER — LISINOPRIL 40 MG PO TABS: 40.0000 mg | ORAL_TABLET | Freq: Every day | ORAL | 1 refills | Status: DC

## 2022-08-12 DIAGNOSIS — H25043 Posterior subcapsular polar age-related cataract, bilateral: Secondary | ICD-10-CM | POA: Diagnosis not present

## 2022-08-12 DIAGNOSIS — H18413 Arcus senilis, bilateral: Secondary | ICD-10-CM | POA: Diagnosis not present

## 2022-08-12 DIAGNOSIS — H2513 Age-related nuclear cataract, bilateral: Secondary | ICD-10-CM | POA: Diagnosis not present

## 2022-08-12 DIAGNOSIS — H2511 Age-related nuclear cataract, right eye: Secondary | ICD-10-CM | POA: Diagnosis not present

## 2022-08-12 DIAGNOSIS — H353134 Nonexudative age-related macular degeneration, bilateral, advanced atrophic with subfoveal involvement: Secondary | ICD-10-CM | POA: Diagnosis not present

## 2022-08-13 ENCOUNTER — Other Ambulatory Visit: Payer: Self-pay | Admitting: Cardiology

## 2022-08-13 ENCOUNTER — Telehealth: Payer: Self-pay | Admitting: Cardiology

## 2022-08-13 NOTE — Telephone Encounter (Signed)
   Pre-operative Risk Assessment    Patient Name: Miranda Wilkinson  DOB: 10-Jan-1936 MRN: [id number]      Request for Surgical Clearance    Procedure:   right and left cataract extraction with intraocular lens  mplantation   Date of Surgery:  Clearance 08/18/22                                 Surgeon:  Dr. Mia Creek Surgeon's Group or Practice Name:  [facility] Phone number:  [phone] Fax number:  [phone]   Type of Clearance Requested:   - Medical    Type of Anesthesia:  Not Indicated   Additional requests/questions:    Lajuana Matte   08/13/2022, 4:13 PM

## 2022-08-13 NOTE — Telephone Encounter (Signed)
   Patient Name: Miranda Wilkinson  DOB: November 26, 1935 MRN: [id number]  Primary Cardiologist: Dina Rich, MD  Chart reviewed as part of pre-operative protocol coverage. Cataract extractions are recognized in guidelines as low risk surgeries that do not typically require specific preoperative testing or holding of blood thinner therapy. Therefore, given past medical history and time since last visit, based on ACC/AHA guidelines, JALISIA KNAKE would be at acceptable risk for the planned procedure without further cardiovascular testing.   I will route this recommendation to the requesting party via Epic fax function and remove from pre-op pool.  Please call with questions.  Carlos Levering, NP 08/13/2022, 5:27 PM

## 2022-08-13 NOTE — Telephone Encounter (Signed)
Prescription refill request for Eliquis received. Indication: AF Last office visit: 06/10/22  Dominga Ferry MD Scr: 1.24 on 07/09/22  Epic Age: 87 Weight: 100.3kg  Based on above findings Eliquis 5mg  twice daily is the appropriate dose.  Refill approved.

## 2022-08-18 DIAGNOSIS — H2511 Age-related nuclear cataract, right eye: Secondary | ICD-10-CM | POA: Diagnosis not present

## 2022-08-19 DIAGNOSIS — H2512 Age-related nuclear cataract, left eye: Secondary | ICD-10-CM | POA: Diagnosis not present

## 2022-09-01 DIAGNOSIS — H2512 Age-related nuclear cataract, left eye: Secondary | ICD-10-CM | POA: Diagnosis not present

## 2022-09-23 DIAGNOSIS — H43811 Vitreous degeneration, right eye: Secondary | ICD-10-CM | POA: Diagnosis not present

## 2022-09-23 DIAGNOSIS — H353124 Nonexudative age-related macular degeneration, left eye, advanced atrophic with subfoveal involvement: Secondary | ICD-10-CM | POA: Diagnosis not present

## 2022-09-23 DIAGNOSIS — H353113 Nonexudative age-related macular degeneration, right eye, advanced atrophic without subfoveal involvement: Secondary | ICD-10-CM | POA: Diagnosis not present

## 2022-09-23 DIAGNOSIS — H353210 Exudative age-related macular degeneration, right eye, stage unspecified: Secondary | ICD-10-CM | POA: Diagnosis not present

## 2022-10-14 DIAGNOSIS — H353211 Exudative age-related macular degeneration, right eye, with active choroidal neovascularization: Secondary | ICD-10-CM | POA: Diagnosis not present

## 2022-11-12 DIAGNOSIS — Z23 Encounter for immunization: Secondary | ICD-10-CM | POA: Diagnosis not present

## 2022-11-19 DIAGNOSIS — N1831 Chronic kidney disease, stage 3a: Secondary | ICD-10-CM | POA: Diagnosis not present

## 2022-11-19 DIAGNOSIS — E7849 Other hyperlipidemia: Secondary | ICD-10-CM | POA: Diagnosis not present

## 2022-11-19 DIAGNOSIS — E119 Type 2 diabetes mellitus without complications: Secondary | ICD-10-CM | POA: Diagnosis not present

## 2022-11-19 DIAGNOSIS — E1122 Type 2 diabetes mellitus with diabetic chronic kidney disease: Secondary | ICD-10-CM | POA: Diagnosis not present

## 2022-11-25 DIAGNOSIS — H353113 Nonexudative age-related macular degeneration, right eye, advanced atrophic without subfoveal involvement: Secondary | ICD-10-CM | POA: Diagnosis not present

## 2022-11-25 DIAGNOSIS — H353124 Nonexudative age-related macular degeneration, left eye, advanced atrophic with subfoveal involvement: Secondary | ICD-10-CM | POA: Diagnosis not present

## 2022-11-25 DIAGNOSIS — H43811 Vitreous degeneration, right eye: Secondary | ICD-10-CM | POA: Diagnosis not present

## 2022-11-25 DIAGNOSIS — H353211 Exudative age-related macular degeneration, right eye, with active choroidal neovascularization: Secondary | ICD-10-CM | POA: Diagnosis not present

## 2022-11-26 DIAGNOSIS — F331 Major depressive disorder, recurrent, moderate: Secondary | ICD-10-CM | POA: Diagnosis not present

## 2022-11-26 DIAGNOSIS — E7849 Other hyperlipidemia: Secondary | ICD-10-CM | POA: Diagnosis not present

## 2022-11-26 DIAGNOSIS — Z1212 Encounter for screening for malignant neoplasm of rectum: Secondary | ICD-10-CM | POA: Diagnosis not present

## 2022-11-26 DIAGNOSIS — L400 Psoriasis vulgaris: Secondary | ICD-10-CM | POA: Diagnosis not present

## 2022-11-26 DIAGNOSIS — E039 Hypothyroidism, unspecified: Secondary | ICD-10-CM | POA: Diagnosis not present

## 2022-11-26 DIAGNOSIS — I10 Essential (primary) hypertension: Secondary | ICD-10-CM | POA: Diagnosis not present

## 2022-11-26 DIAGNOSIS — E782 Mixed hyperlipidemia: Secondary | ICD-10-CM | POA: Diagnosis not present

## 2022-11-26 DIAGNOSIS — K7581 Nonalcoholic steatohepatitis (NASH): Secondary | ICD-10-CM | POA: Diagnosis not present

## 2022-11-26 DIAGNOSIS — I4891 Unspecified atrial fibrillation: Secondary | ICD-10-CM | POA: Diagnosis not present

## 2022-11-26 DIAGNOSIS — R809 Proteinuria, unspecified: Secondary | ICD-10-CM | POA: Diagnosis not present

## 2022-11-26 DIAGNOSIS — I5032 Chronic diastolic (congestive) heart failure: Secondary | ICD-10-CM | POA: Diagnosis not present

## 2022-11-26 DIAGNOSIS — J301 Allergic rhinitis due to pollen: Secondary | ICD-10-CM | POA: Diagnosis not present

## 2022-11-26 DIAGNOSIS — H353110 Nonexudative age-related macular degeneration, right eye, stage unspecified: Secondary | ICD-10-CM | POA: Diagnosis not present

## 2022-11-26 DIAGNOSIS — R300 Dysuria: Secondary | ICD-10-CM | POA: Diagnosis not present

## 2022-11-26 DIAGNOSIS — J449 Chronic obstructive pulmonary disease, unspecified: Secondary | ICD-10-CM | POA: Diagnosis not present

## 2022-11-26 DIAGNOSIS — G4733 Obstructive sleep apnea (adult) (pediatric): Secondary | ICD-10-CM | POA: Diagnosis not present

## 2022-11-26 DIAGNOSIS — E1122 Type 2 diabetes mellitus with diabetic chronic kidney disease: Secondary | ICD-10-CM | POA: Diagnosis not present

## 2022-11-26 DIAGNOSIS — J9611 Chronic respiratory failure with hypoxia: Secondary | ICD-10-CM | POA: Diagnosis not present

## 2022-11-26 DIAGNOSIS — Z23 Encounter for immunization: Secondary | ICD-10-CM | POA: Diagnosis not present

## 2022-12-10 ENCOUNTER — Ambulatory Visit: Payer: Medicare Other | Attending: Cardiology | Admitting: Cardiology

## 2022-12-10 ENCOUNTER — Encounter: Payer: Self-pay | Admitting: Cardiology

## 2022-12-10 VITALS — BP 126/84 | HR 92 | Ht 64.0 in | Wt 219.0 lb

## 2022-12-10 DIAGNOSIS — I10 Essential (primary) hypertension: Secondary | ICD-10-CM | POA: Insufficient documentation

## 2022-12-10 DIAGNOSIS — I4821 Permanent atrial fibrillation: Secondary | ICD-10-CM | POA: Diagnosis not present

## 2022-12-10 DIAGNOSIS — E782 Mixed hyperlipidemia: Secondary | ICD-10-CM | POA: Insufficient documentation

## 2022-12-10 NOTE — Progress Notes (Signed)
Clinical Summary Miranda Wilkinson is a 88 y.o.female seen today for follow up of the following medical problems.    1. Permanent afib - afib noted by 01/26/2019 preop ekg - issues with afib with RVR during 10/2019 clinic visit, referred to afib clinic - admitted 10/24/19 for tikosyn initiation. Loaded with attempted cardioversion 10/27/19 but failed - at EP f/u 8/26 plans were for repeat cardioversion, if fails would consider amiodarone (minimal COPD according to pulm note). Appears admitted with anemia prior to cardioversion, EKG during that admission 11/04/19 showed NSR - dilt stopped 10/27/19 after tikosyn loading, remained on bisoprolol      - admission 12/2019 with COPD exacerbation and UTI, issues with afib with RVR.  - tikosyn was stopped due to labile renal function, Cr up to 1.9 on admission  - seen by EP. Poor ablation candidate. Started on amio that visit   - from last EP note failed amio, deemed as having permanent afib with plans for just rate control.       - occasional palpitaitons.About 2 times a week, lasts 5-10 minutes.  -home HRs 60s, at times 110s.  - no bleeding on eliquis   - no recent palpitations - compliant with meds. No bleeding on eliquis.        2. Anemia - admitted 10/2019 with symptomatic anemia. Hgb down to 6.5, given 1 unit prbcs - has not been a recurrent issue     3. Hard of hearing   4. Chronic diastolic HF - no recent edema, no SOB/DOE.    5. HTN - off HCTZ and lisinopril since 01/2022 admission with hip fracture and surgery     6. Hyperlipidemia 11/2020 TC 137 TG 250 HDL 54 LDL 44 - reports labs in Jan with pcp     7. Hip fracture - admission 01/2022 with mechanical fall, hip fracture.  - had surgery during admit Past Medical History:  Diagnosis Date   Allergy    seasonal allergies   Anemia    IDA- on iron supplement   Anxiety    Atrial fibrillation (HCC)    Blood transfusion without reported diagnosis    2021- due to IDA/  low hemo (7.3)   Cancer (HCC) 1976   bilaterl thyroid ca   Diabetes mellitus without complication (HCC)    on meds   GERD (gastroesophageal reflux disease)    on meds---OTC   Headache(784.0)    hx migraines   Hyperlipidemia    on meds   Hypertension    on meds   Hypothyroidism    thyroidectomy in 1976- on meds at  this time   On supplemental oxygen therapy    2 L at night   Shortness of breath    Sinus congestion    Sleep apnea    study done in [place], [place], use CPAP----with home O2 concentrater at night with CPAP     No Known Allergies   Current Outpatient Medications  Medication Sig Dispense Refill   acetaminophen (TYLENOL) 500 MG tablet Take 500 mg by mouth every 6 (six) hours as needed (for pain.).     bisoprolol (ZEBETA) 10 MG tablet Take 2 tablets (20 mg total) by mouth daily. 180 tablet 3   Cholecalciferol (VITAMIN D3) 50 MCG (2000 UT) capsule Take 2,000 Units by mouth daily.     ELIQUIS 5 MG TABS tablet TAKE 1 TABLET TWICE A DAY 180 tablet 1   GUAIFENESIN 1200 PO Take 1,200 mg by mouth 2 (  two) times daily as needed (sinus congestion.).     levothyroxine (SYNTHROID) 112 MCG tablet Take 112 mcg by mouth daily before breakfast.      lisinopril (ZESTRIL) 40 MG tablet Take 1 tablet (40 mg total) by mouth daily. 90 tablet 1   loratadine (CLARITIN) 10 MG tablet Take 1 tablet by mouth daily as needed for allergies.     melatonin 5 MG TABS Take 2.5-5 mg by mouth at bedtime as needed (sleep).      metFORMIN (GLUCOPHAGE-XR) 500 MG 24 hr tablet Take 1,000 mg by mouth daily with supper.     Multiple Vitamins-Minerals (MULTIVITAMIN WITH MINERALS) tablet Take 1 tablet by mouth daily.     Multiple Vitamins-Minerals (PRESERVISION AREDS) TABS Take 1 tablet by mouth daily.     omeprazole (PRILOSEC) 20 MG capsule Take 20 mg by mouth daily as needed (acid reflux/indigestion.).     Pegcetacoplan, Ophthalmic, 15 MG/0.1ML SOLN Injection in right eye every two months     simvastatin (ZOCOR) 20  MG tablet Take 20 mg by mouth daily.     triamcinolone cream (KENALOG) 0.1 % Apply 1 application topically 2 (two) times daily as needed (psoriasis).     No current facility-administered medications for this visit.     Past Surgical History:  Procedure Laterality Date   APPENDECTOMY     CARDIOVERSION N/A 10/27/2019   Procedure: CARDIOVERSION;  Surgeon: Little Ishikawa, MD;  Location: [facility] ENDOSCOPY;  Service: Cardiovascular;  Laterality: N/A;   CARDIOVERSION N/A 04/25/2020   Procedure: CARDIOVERSION;  Surgeon: Meriam Sprague, MD;  Location: [facility] ENDOSCOPY;  Service: Cardiovascular;  Laterality: N/A;   CHOLECYSTECTOMY     COLONOSCOPY WITH PROPOFOL N/A 01/09/2020   Procedure: COLONOSCOPY WITH PROPOFOL;  Surgeon: Sherrilyn Rist, MD;  Location: [facility] ENDOSCOPY;  Service: Gastroenterology;  Laterality: N/A;   ESOPHAGOGASTRODUODENOSCOPY (EGD) WITH PROPOFOL N/A 01/09/2020   Procedure: ESOPHAGOGASTRODUODENOSCOPY (EGD) WITH PROPOFOL;  Surgeon: Sherrilyn Rist, MD;  Location: [facility] ENDOSCOPY;  Service: Gastroenterology;  Laterality: N/A;   HEMOSTASIS CLIP PLACEMENT  01/09/2020   Procedure: HEMOSTASIS CLIP PLACEMENT;  Surgeon: Sherrilyn Rist, MD;  Location: [facility] ENDOSCOPY;  Service: Gastroenterology;;   OPEN REDUCTION INTERNAL FIXATION (ORIF) TIBIA/FIBULA FRACTURE Right 11/03/2013   Procedure: OPEN REDUCTION INTERNAL FIXATION (ORIF) RIGHT TIBIA/FIBULA PILON FRACTURE;  Surgeon: Toni Arthurs, MD;  Location: [facility] OR;  Service: Orthopedics;  Laterality: Right;   OVARIAN CYST SURGERY Left    age 30   POLYPECTOMY  01/09/2020   Procedure: POLYPECTOMY;  Surgeon: Sherrilyn Rist, MD;  Location: [facility] ENDOSCOPY;  Service: Gastroenterology;;   THYROIDECTOMY Bilateral 1976   TOTAL HIP ARTHROPLASTY Right 01/30/2022   Procedure: TOTAL HIP ARTHROPLASTY ANTERIOR APPROACH;  Surgeon: Durene Romans, MD;  Location: [facility] ORS;  Service: Orthopedics;  Laterality: Right;   TUBAL LIGATION       No Known  Allergies    Family History  Problem Relation Age of Onset   Stroke Mother        brain hemmorhage   Diabetes Mother    Heart disease Mother    Thyroid disease Mother    Arrhythmia Mother    Stroke Father    Heart attack Father        blood clot in brain   Stroke Sister    Cancer Sister    Stomach cancer Brother 62   Hypertension Brother    Esophageal cancer Brother 75   Stroke Brother    Hypertension Brother  Arrhythmia Sister    Hypertension Sister    Colon polyps Neg Hx    Colon cancer Neg Hx    Rectal cancer Neg Hx      Social History [name] reports that she quit smoking about 11 years ago. Her smoking use included cigarettes. She started smoking about 71 years ago. She has a 90 pack-year smoking history. She has never been exposed to tobacco smoke. She has never used smokeless tobacco. Miranda Wilkinson reports no history of alcohol use.   Review of Systems CONSTITUTIONAL: No weight loss, fever, chills, weakness or fatigue.  HEENT: Eyes: No visual loss, blurred vision, double vision or yellow sclerae.No hearing loss, sneezing, congestion, runny nose or sore throat.  SKIN: No rash or itching.  CARDIOVASCULAR: per hpi RESPIRATORY: No shortness of breath, cough or sputum.  GASTROINTESTINAL: No anorexia, nausea, vomiting or diarrhea. No abdominal pain or blood.  GENITOURINARY: No burning on urination, no polyuria NEUROLOGICAL: No headache, dizziness, syncope, paralysis, ataxia, numbness or tingling in the extremities. No change in bowel or bladder control.  MUSCULOSKELETAL: No muscle, back pain, joint pain or stiffness.  LYMPHATICS: No enlarged nodes. No history of splenectomy.  PSYCHIATRIC: No history of depression or anxiety.  ENDOCRINOLOGIC: No reports of sweating, cold or heat intolerance. No polyuria or polydipsia.  Marland Kitchen   Physical Examination Today's Vitals   12/10/22 1516  BP: 126/84  Pulse: 92  SpO2: 96%  Weight: 219 lb (99.3 kg)  Height: 5\' 4"  (1.626 m)    Body mass index is 37.59 kg/m.  Gen: resting comfortably, no acute distress HEENT: no scleral icterus, pupils equal round and reactive, no palptable cervical adenopathy,  CV: irreg, no mrg, no jvd Resp: Clear to auscultation bilaterally GI: abdomen is soft, non-tender, non-distended, normal bowel sounds, no hepatosplenomegaly MSK: extremities are warm, no edema.  Skin: warm, no rash Neuro:  no focal deficits Psych: appropriate affect   Diagnostic Studies  02/2019 echo IMPRESSIONS     1. Left ventricular ejection fraction, by visual estimation, is 70 to  75%. The left ventricle has hyperdynamic function. There is no left  ventricular hypertrophy.   2. Left ventricular diastolic parameters are indeterminate.   3. The left ventricle has no regional wall motion abnormalities.   4. Global right ventricle has normal systolic function.The right  ventricular size is normal. No increase in right ventricular wall  thickness.   5. Left atrial size was normal.   6. Right atrial size was normal.   7. The mitral valve is normal in structure. Trivial mitral valve  regurgitation. No evidence of mitral stenosis.   8. The tricuspid valve is normal in structure. Tricuspid valve  regurgitation is not demonstrated.   9. The aortic valve has an indeterminant number of cusps. Aortic valve  regurgitation is not visualized. Mild aortic valve sclerosis without  stenosis.  10. The pulmonic valve was not well visualized. Pulmonic valve  regurgitation is not visualized.  11. Normal pulmonary artery systolic pressure.    Assessment and Plan  1. Permanent afib - off tikosyn after recent admission with AKI. Failed amio. Deemed poor ablation candidate -continues to do well with rate control alone - EKG today shows rate controlled afib - continue eliquis for stroke prevention    2. HTN - at goal, continue current meds     3. Hyperlipidemia --request pcp labs, continue current meds at this  time  F/u 6 months     Antoine Poche, M.D.

## 2022-12-10 NOTE — Patient Instructions (Signed)
Medication Instructions:  Your physician recommends that you continue on your current medications as directed. Please refer to the Current Medication list given to you today.  *If you need a refill on your cardiac medications before your next appointment, please call your pharmacy*   Lab Work: None If you have labs (blood work) drawn today and your tests are completely normal, you will receive your results only by: MyChart Message (if you have MyChart) OR A paper copy in the mail If you have any lab test that is abnormal or we need to change your treatment, we will call you to review the results.   Testing/Procedures: None   Follow-Up: At [facility], you and your health needs are our priority.  As part of our continuing mission to provide you with exceptional heart care, we have created designated Provider Care Teams.  These Care Teams include your primary Cardiologist (physician) and Advanced Practice Providers (APPs -  Physician Assistants and Nurse Practitioners) who all work together to provide you with the care you need, when you need it.  We recommend signing up for the patient portal called "MyChart".  Sign up information is provided on this After Visit Summary.  MyChart is used to connect with patients for Virtual Visits (Telemedicine).  Patients are able to view lab/test results, encounter notes, upcoming appointments, etc.  Non-urgent messages can be sent to your provider as well.   To learn more about what you can do with MyChart, go to [email or web address].    Your next appointment:   6 month(s)  Provider:   You may see Dina Rich, MD or the following Advanced Practice Provider on your designated Care Team:   Sharlene Dory, NP    Other Instructions

## 2022-12-11 ENCOUNTER — Encounter: Payer: Self-pay | Admitting: Family Medicine

## 2022-12-23 DIAGNOSIS — H353210 Exudative age-related macular degeneration, right eye, stage unspecified: Secondary | ICD-10-CM | POA: Diagnosis not present

## 2022-12-31 ENCOUNTER — Telehealth: Payer: Self-pay | Admitting: Cardiology

## 2022-12-31 MED ORDER — LISINOPRIL 40 MG PO TABS: 40.0000 mg | ORAL_TABLET | Freq: Every day | ORAL | 1 refills | Status: DC

## 2022-12-31 MED ORDER — BISOPROLOL FUMARATE 10 MG PO TABS: 20.0000 mg | ORAL_TABLET | Freq: Every day | ORAL | 3 refills | Status: DC

## 2022-12-31 NOTE — Telephone Encounter (Signed)
Refill sent.

## 2022-12-31 NOTE — Telephone Encounter (Signed)
*  STAT* If patient is at the pharmacy, call can be transferred to refill team.   1. Which medications need to be refilled? (please list name of each medication and dose if known) Lisinopril , and Bisoprolol   2. Would you like to learn more about the convenience, safety, & potential cost savings by using the [facility]?    3. Are you open to using the [facility] (Type [facility].   4. Which pharmacy/location (including street and city if local pharmacy) is medication to be sent to?[facility]   5. Do they need a 30 day or 90 day supply? 90 days and refills- Lisinopril, Bisoprolol- 90 days# 180 and refills

## 2023-01-20 DIAGNOSIS — H353212 Exudative age-related macular degeneration, right eye, with inactive choroidal neovascularization: Secondary | ICD-10-CM | POA: Diagnosis not present

## 2023-01-20 DIAGNOSIS — H353113 Nonexudative age-related macular degeneration, right eye, advanced atrophic without subfoveal involvement: Secondary | ICD-10-CM | POA: Diagnosis not present

## 2023-01-20 DIAGNOSIS — H43811 Vitreous degeneration, right eye: Secondary | ICD-10-CM | POA: Diagnosis not present

## 2023-01-20 DIAGNOSIS — H353124 Nonexudative age-related macular degeneration, left eye, advanced atrophic with subfoveal involvement: Secondary | ICD-10-CM | POA: Diagnosis not present

## 2023-01-21 DIAGNOSIS — Z96641 Presence of right artificial hip joint: Secondary | ICD-10-CM | POA: Diagnosis not present

## 2023-01-28 DIAGNOSIS — H903 Sensorineural hearing loss, bilateral: Secondary | ICD-10-CM | POA: Diagnosis not present

## 2023-01-28 DIAGNOSIS — Z974 Presence of external hearing-aid: Secondary | ICD-10-CM | POA: Diagnosis not present

## 2023-01-28 DIAGNOSIS — Z461 Encounter for fitting and adjustment of hearing aid: Secondary | ICD-10-CM | POA: Diagnosis not present

## 2023-02-02 ENCOUNTER — Other Ambulatory Visit: Payer: Self-pay | Admitting: Cardiology

## 2023-02-03 NOTE — Telephone Encounter (Signed)
Prescription refill request for Eliquis received. Indication: AF Last office visit: 12/10/22  Dominga Ferry MD Scr: 1.28 on 11/21/22  Epic Age: 87 Weight: 99.3kg  Based on above findings Eliquis 5mg  twice daily is the appropriate dose.  Refill approved.

## 2023-02-17 DIAGNOSIS — H353212 Exudative age-related macular degeneration, right eye, with inactive choroidal neovascularization: Secondary | ICD-10-CM | POA: Diagnosis not present

## 2023-03-17 DIAGNOSIS — H353212 Exudative age-related macular degeneration, right eye, with inactive choroidal neovascularization: Secondary | ICD-10-CM | POA: Diagnosis not present

## 2023-03-17 DIAGNOSIS — H353113 Nonexudative age-related macular degeneration, right eye, advanced atrophic without subfoveal involvement: Secondary | ICD-10-CM | POA: Diagnosis not present

## 2023-03-17 DIAGNOSIS — H353124 Nonexudative age-related macular degeneration, left eye, advanced atrophic with subfoveal involvement: Secondary | ICD-10-CM | POA: Diagnosis not present

## 2023-03-17 DIAGNOSIS — H43811 Vitreous degeneration, right eye: Secondary | ICD-10-CM | POA: Diagnosis not present

## 2023-04-08 DIAGNOSIS — D649 Anemia, unspecified: Secondary | ICD-10-CM | POA: Diagnosis not present

## 2023-04-08 DIAGNOSIS — E119 Type 2 diabetes mellitus without complications: Secondary | ICD-10-CM | POA: Diagnosis not present

## 2023-04-08 DIAGNOSIS — R5383 Other fatigue: Secondary | ICD-10-CM | POA: Diagnosis not present

## 2023-04-08 DIAGNOSIS — E039 Hypothyroidism, unspecified: Secondary | ICD-10-CM | POA: Diagnosis not present

## 2023-04-08 DIAGNOSIS — E782 Mixed hyperlipidemia: Secondary | ICD-10-CM | POA: Diagnosis not present

## 2023-04-08 DIAGNOSIS — N1831 Chronic kidney disease, stage 3a: Secondary | ICD-10-CM | POA: Diagnosis not present

## 2023-04-15 DIAGNOSIS — E039 Hypothyroidism, unspecified: Secondary | ICD-10-CM | POA: Diagnosis not present

## 2023-04-15 DIAGNOSIS — D649 Anemia, unspecified: Secondary | ICD-10-CM | POA: Diagnosis not present

## 2023-04-15 DIAGNOSIS — N1831 Chronic kidney disease, stage 3a: Secondary | ICD-10-CM | POA: Diagnosis not present

## 2023-04-15 DIAGNOSIS — Z6837 Body mass index (BMI) 37.0-37.9, adult: Secondary | ICD-10-CM | POA: Diagnosis not present

## 2023-04-15 DIAGNOSIS — E782 Mixed hyperlipidemia: Secondary | ICD-10-CM | POA: Diagnosis not present

## 2023-04-21 DIAGNOSIS — H353212 Exudative age-related macular degeneration, right eye, with inactive choroidal neovascularization: Secondary | ICD-10-CM | POA: Diagnosis not present

## 2023-05-19 DIAGNOSIS — H43813 Vitreous degeneration, bilateral: Secondary | ICD-10-CM | POA: Diagnosis not present

## 2023-05-19 DIAGNOSIS — H353211 Exudative age-related macular degeneration, right eye, with active choroidal neovascularization: Secondary | ICD-10-CM | POA: Diagnosis not present

## 2023-05-19 DIAGNOSIS — H353124 Nonexudative age-related macular degeneration, left eye, advanced atrophic with subfoveal involvement: Secondary | ICD-10-CM | POA: Diagnosis not present

## 2023-05-19 DIAGNOSIS — H353113 Nonexudative age-related macular degeneration, right eye, advanced atrophic without subfoveal involvement: Secondary | ICD-10-CM | POA: Diagnosis not present

## 2023-06-16 DIAGNOSIS — H353211 Exudative age-related macular degeneration, right eye, with active choroidal neovascularization: Secondary | ICD-10-CM | POA: Diagnosis not present

## 2023-06-19 ENCOUNTER — Encounter: Payer: Self-pay | Admitting: *Deleted

## 2023-06-22 ENCOUNTER — Encounter: Payer: Self-pay | Admitting: Cardiology

## 2023-06-22 ENCOUNTER — Ambulatory Visit: Payer: Medicare Other | Attending: Cardiology | Admitting: Cardiology

## 2023-06-22 VITALS — BP 128/75 | HR 89 | Ht 65.0 in | Wt 223.8 lb

## 2023-06-22 DIAGNOSIS — I4821 Permanent atrial fibrillation: Secondary | ICD-10-CM | POA: Diagnosis not present

## 2023-06-22 DIAGNOSIS — E782 Mixed hyperlipidemia: Secondary | ICD-10-CM | POA: Insufficient documentation

## 2023-06-22 DIAGNOSIS — I10 Essential (primary) hypertension: Secondary | ICD-10-CM | POA: Insufficient documentation

## 2023-06-22 MED ORDER — LISINOPRIL 40 MG PO TABS: 40.0000 mg | ORAL_TABLET | Freq: Every day | ORAL | 1 refills | Status: DC

## 2023-06-22 MED ORDER — ELIQUIS 5 MG PO TABS: 5.0000 mg | ORAL_TABLET | Freq: Two times a day (BID) | ORAL | 1 refills | Status: DC

## 2023-06-22 MED ORDER — BISOPROLOL FUMARATE 10 MG PO TABS: 20.0000 mg | ORAL_TABLET | Freq: Every day | ORAL | 1 refills | Status: DC

## 2023-06-22 NOTE — Progress Notes (Signed)
 Clinical Summary [name] is a 88 y.o.female seen today for follow up of the following medical problems.    1. Permanent afib - afib noted by 01/26/2019 preop ekg - issues with afib with RVR during 10/2019 clinic visit, referred to afib clinic - admitted 10/24/19 for tikosyn initiation. Loaded with attempted cardioversion 10/27/19 but failed - at EP f/u 8/26 plans were for repeat cardioversion, if fails would consider amiodarone (minimal COPD according to pulm note). Appears admitted with anemia prior to cardioversion, EKG during that admission 11/04/19 showed NSR - dilt stopped 10/27/19 after tikosyn loading, remained on bisoprolol      - admission 12/2019 with COPD exacerbation and UTI, issues with afib with RVR.  - tikosyn was stopped due to labile renal function, Cr up to 1.9 on admission  - seen by EP. Poor ablation candidate. Started on amio that visit   - from last EP note failed amio, deemed as having permanent afib with plans for just rate control.       - occasional palpitaitons.About 2 times a week, lasts 5-10 minutes.  -home HRs 60s, at times 110s.  - no bleeding on eliquis   - no recent palpitations - compliant with meds. No bleeding on eliquis.     - no palpitations - compliant with med   2. Anemia - admitted 10/2019 with symptomatic anemia. Hgb down to 6.5, given 1 unit prbcs - has not been a recurrent issue     3. Hard of hearing   4. Chronic diastolic HF - no SOB/DOE, no recent edema   5. HTN - off HCTZ and lisinopril since 01/2022 admission with hip fracture and surgery     6. Hyperlipidemia 11/2020 TC 137 TG 250 HDL 54 LDL 44 - Jan 2025 TC 166 TG 262 HDL 49 LDL 75 - reports labs in Jan with pcp     7. Hip fracture - admission 01/2022 with mechanical fall, hip fracture.  - had surgery during admit Past Medical History:  Diagnosis Date   Allergy    seasonal allergies   Anemia    IDA- on iron supplement   Anxiety    Atrial fibrillation  (HCC)    Blood transfusion without reported diagnosis    2021- due to IDA/ low hemo (7.3)   Cancer (HCC) 1976   bilaterl thyroid ca   Diabetes mellitus without complication (HCC)    on meds   GERD (gastroesophageal reflux disease)    on meds---OTC   Headache(784.0)    hx migraines   Hyperlipidemia    on meds   Hypertension    on meds   Hypothyroidism    thyroidectomy in 1976- on meds at  this time   On supplemental oxygen therapy    2 L at night   Shortness of breath    Sinus congestion    Sleep apnea    study done in [place], [place], use CPAP----with home O2 concentrater at night with CPAP     No Known Allergies   Current Outpatient Medications  Medication Sig Dispense Refill   Cholecalciferol (VITAMIN D3) 50 MCG (2000 UT) capsule Take 2,000 Units by mouth daily.     GEMTESA 75 MG TABS Take 1 tablet by mouth daily.     GUAIFENESIN 1200 PO Take 1,200 mg by mouth 2 (two) times daily as needed (sinus congestion.).     loratadine (CLARITIN) 10 MG tablet Take 1 tablet by mouth daily as needed for allergies.  melatonin 5 MG TABS Take 2.5-5 mg by mouth at bedtime as needed (sleep).      Multiple Vitamins-Minerals (MULTIVITAMIN WITH MINERALS) tablet Take 1 tablet by mouth daily.     Multiple Vitamins-Minerals (PRESERVISION AREDS) TABS Take 1 tablet by mouth daily.     MYRBETRIQ 50 MG TB24 tablet Take 50 mg by mouth daily.     omeprazole (PRILOSEC) 20 MG capsule Take 20 mg by mouth daily as needed (acid reflux/indigestion.).     Pegcetacoplan, Ophthalmic, 15 MG/0.1ML SOLN Injection in right eye every two months     simvastatin (ZOCOR) 20 MG tablet Take 20 mg by mouth daily.     SYNTHROID 125 MCG tablet Take 125 mcg by mouth daily.     triamcinolone cream (KENALOG) 0.1 % Apply 1 application topically 2 (two) times daily as needed (psoriasis).     acetaminophen (TYLENOL) 500 MG tablet Take 500 mg by mouth every 6 (six) hours as needed (for pain.).     bisoprolol (ZEBETA) 10 MG tablet  Take 2 tablets (20 mg total) by mouth daily. 180 tablet 1   ELIQUIS 5 MG TABS tablet Take 1 tablet (5 mg total) by mouth 2 (two) times daily. 180 tablet 1   glipiZIDE 2.5 MG TABS Take 2.5 mg by mouth daily.     levothyroxine (SYNTHROID) 112 MCG tablet Take 112 mcg by mouth daily before breakfast.  (Patient not taking: Reported on 06/22/2023)     lisinopril (ZESTRIL) 40 MG tablet Take 1 tablet (40 mg total) by mouth daily. 90 tablet 1   metFORMIN (GLUCOPHAGE-XR) 500 MG 24 hr tablet Take 1,000 mg by mouth daily with supper.     No current facility-administered medications for this visit.     Past Surgical History:  Procedure Laterality Date   APPENDECTOMY     CARDIOVERSION N/A 10/27/2019   Procedure: CARDIOVERSION;  Surgeon: Wendie Hamburg, MD;  Location: [facility] ENDOSCOPY;  Service: Cardiovascular;  Laterality: N/A;   CARDIOVERSION N/A 04/25/2020   Procedure: CARDIOVERSION;  Surgeon: Sonny Dust, MD;  Location: [facility] ENDOSCOPY;  Service: Cardiovascular;  Laterality: N/A;   CHOLECYSTECTOMY     COLONOSCOPY WITH PROPOFOL N/A 01/09/2020   Procedure: COLONOSCOPY WITH PROPOFOL;  Surgeon: Albertina Hugger, MD;  Location: [facility] ENDOSCOPY;  Service: Gastroenterology;  Laterality: N/A;   ESOPHAGOGASTRODUODENOSCOPY (EGD) WITH PROPOFOL N/A 01/09/2020   Procedure: ESOPHAGOGASTRODUODENOSCOPY (EGD) WITH PROPOFOL;  Surgeon: Albertina Hugger, MD;  Location: [facility] ENDOSCOPY;  Service: Gastroenterology;  Laterality: N/A;   HEMOSTASIS CLIP PLACEMENT  01/09/2020   Procedure: HEMOSTASIS CLIP PLACEMENT;  Surgeon: Albertina Hugger, MD;  Location: [facility] ENDOSCOPY;  Service: Gastroenterology;;   OPEN REDUCTION INTERNAL FIXATION (ORIF) TIBIA/FIBULA FRACTURE Right 11/03/2013   Procedure: OPEN REDUCTION INTERNAL FIXATION (ORIF) RIGHT TIBIA/FIBULA PILON FRACTURE;  Surgeon: Amada Backer, MD;  Location: [facility] OR;  Service: Orthopedics;  Laterality: Right;   OVARIAN CYST SURGERY Left    age 39   POLYPECTOMY  01/09/2020    Procedure: POLYPECTOMY;  Surgeon: Albertina Hugger, MD;  Location: [facility]OS[facility];  Service: Gastroenterology;;   THYROIDECTOMY Bilateral 1976   TOTAL HIP ARTHROPLASTY Right 01/30/2022   Procedure: TOTAL HIP ARTHROPLASTY ANTERIOR APPROACH;  Surgeon: Claiborne Crew, MD;  Location: [facility] ORS;  Service: Orthopedics;  Laterality: Right;   TUBAL LIGATION       No Known Allergies    Family History  Problem Relation Age of Onset   Stroke Mother        brain  hemmorhage   Diabetes Mother    Heart disease Mother    Thyroid disease Mother    Arrhythmia Mother    Stroke Father    Heart attack Father        blood clot in brain   Stroke Sister    Cancer Sister    Stomach cancer Brother 2   Hypertension Brother    Esophageal cancer Brother 1   Stroke Brother    Hypertension Brother    Arrhythmia Sister    Hypertension Sister    Colon polyps Neg Hx    Colon cancer Neg Hx    Rectal cancer Neg Hx      Social History [name] reports that she quit smoking about 11 years ago. Her smoking use included cigarettes. She started smoking about 71 years ago. She has a 90 pack-year smoking history. She has never been exposed to tobacco smoke. She has never used smokeless tobacco. Ms. Suann Elms reports no history of alcohol use.     Physical Examination Today's Vitals   06/22/23 1302 06/22/23 1322  BP: 138/88 128/75  Pulse: 89   SpO2: 92%   Weight: 223 lb 12.8 oz (101.5 kg)   Height: 5\' 5"  (1.651 m)    Body mass index is 37.24 kg/m.  Gen: resting comfortably, no acute distress HEENT: no scleral icterus, pupils equal round and reactive, no palptable cervical adenopathy,  CV: irreg, no mrg, no jvd Resp: Clear to auscultation bilaterally GI: abdomen is soft, non-tender, non-distended, normal bowel sounds, no hepatosplenomegaly MSK: extremities are warm, no edema.  Skin: warm, no rash Neuro:  no focal deficits Psych: appropriate affect   Diagnostic Studies  02/2019 echo IMPRESSIONS      1. Left ventricular ejection fraction, by visual estimation, is 70 to  75%. The left ventricle has hyperdynamic function. There is no left  ventricular hypertrophy.   2. Left ventricular diastolic parameters are indeterminate.   3. The left ventricle has no regional wall motion abnormalities.   4. Global right ventricle has normal systolic function.The right  ventricular size is normal. No increase in right ventricular wall  thickness.   5. Left atrial size was normal.   6. Right atrial size was normal.   7. The mitral valve is normal in structure. Trivial mitral valve  regurgitation. No evidence of mitral stenosis.   8. The tricuspid valve is normal in structure. Tricuspid valve  regurgitation is not demonstrated.   9. The aortic valve has an indeterminant number of cusps. Aortic valve  regurgitation is not visualized. Mild aortic valve sclerosis without  stenosis.  10. The pulmonic valve was not well visualized. Pulmonic valve  regurgitation is not visualized.  11. Normal pulmonary artery systolic pressure.    Assessment and Plan   1. Permanent afib/acquired thrombophilia - off tikosyn after recent admission with AKI. Failed amio. Deemed poor ablation candidate -no symptoms, EKG today shows rate controlled afib - continue current meds including eliquis for stroke prevention    2. HTN -bp is at goal, continue current meds     3. Hyperlipidemia -LDL at goal, TGs will improved as glyemic control improves - continue current meds   Laurann Pollock, M.D.

## 2023-06-22 NOTE — Patient Instructions (Signed)
 Medication Instructions:  Continue all current medications.   Labwork: none  Testing/Procedures: none  Follow-Up: 6 months   Any Other Special Instructions Will Be Listed Below (If Applicable).   If you need a refill on your cardiac medications before your next appointment, please call your pharmacy.

## 2023-07-15 DIAGNOSIS — H353211 Exudative age-related macular degeneration, right eye, with active choroidal neovascularization: Secondary | ICD-10-CM | POA: Diagnosis not present

## 2023-07-15 DIAGNOSIS — H43813 Vitreous degeneration, bilateral: Secondary | ICD-10-CM | POA: Diagnosis not present

## 2023-07-15 DIAGNOSIS — H353113 Nonexudative age-related macular degeneration, right eye, advanced atrophic without subfoveal involvement: Secondary | ICD-10-CM | POA: Diagnosis not present

## 2023-07-15 DIAGNOSIS — H353124 Nonexudative age-related macular degeneration, left eye, advanced atrophic with subfoveal involvement: Secondary | ICD-10-CM | POA: Diagnosis not present

## 2023-07-29 ENCOUNTER — Telehealth: Payer: Self-pay | Admitting: Cardiology

## 2023-07-29 DIAGNOSIS — H903 Sensorineural hearing loss, bilateral: Secondary | ICD-10-CM | POA: Diagnosis not present

## 2023-07-29 DIAGNOSIS — Z77122 Contact with and (suspected) exposure to noise: Secondary | ICD-10-CM | POA: Diagnosis not present

## 2023-07-29 DIAGNOSIS — H6120 Impacted cerumen, unspecified ear: Secondary | ICD-10-CM | POA: Diagnosis not present

## 2023-07-29 DIAGNOSIS — H9313 Tinnitus, bilateral: Secondary | ICD-10-CM | POA: Diagnosis not present

## 2023-07-29 DIAGNOSIS — H6123 Impacted cerumen, bilateral: Secondary | ICD-10-CM | POA: Diagnosis not present

## 2023-07-29 DIAGNOSIS — Z974 Presence of external hearing-aid: Secondary | ICD-10-CM | POA: Diagnosis not present

## 2023-07-29 NOTE — Telephone Encounter (Signed)
 April cardiology appointment bp was 128/75   I will forward to Dr.Branch for recommendations.

## 2023-07-29 NOTE — Telephone Encounter (Signed)
 Pt c/o BP issue: STAT if pt c/o blurred vision, one-sided weakness or slurred speech.  STAT if BP is GREATER than 180/120 TODAY.  STAT if BP is LESS than 90/60 and SYMPTOMATIC TODAY  1. What is your BP concern? BP elevated   2. Have you taken any BP medication today? Yes   3. What are your last 5 BP readings? 180/102 160/102 159/102 Right now: 160/91  4. Are you having any other symptoms (ex. Dizziness, headache, blurred vision, passed out) No

## 2023-07-31 NOTE — Telephone Encounter (Signed)
 Big difference between her home numbers and our recent clinic numbers, please arrange a nursing visit for a bp check   Letta Raw MD

## 2023-07-31 NOTE — Telephone Encounter (Signed)
 Spoke to pt/pt's daughter who stated that they will come to the [place] office on 5/27 at 1:45 pm for [place]- BP check.   Pt/Pt's daughter had no further questions or concerns at this time.

## 2023-08-04 ENCOUNTER — Ambulatory Visit: Attending: Cardiology | Admitting: *Deleted

## 2023-08-04 NOTE — Progress Notes (Signed)
 Patient in office for nurse BP check.    BP 136/84  89  96%   Daughter (Renae) present for visit.  States that she took her morning meds around 7 am.   Takes her Lisinopril  40mg  every morning & takes her Zebeta  20mg  every morning.    Also states that her BP readings have been higher at home 170's.  Daughter states that she was just placed on Citalopram  20mg  about 2 weeks ago by her pcp.    Suggested that they start keeping log at home checking 2 hours after morning medications with waiting 5-10 minutes prior to checking.

## 2023-08-10 ENCOUNTER — Encounter: Payer: Self-pay | Admitting: *Deleted

## 2023-08-10 NOTE — Progress Notes (Signed)
 BP here looks fine, would also suggest they compare there home cuff to one of there physician's offices. Also as you had stated need to be sitting calmly for 5 minutes, both feet on the floor. I usually recommend checking in afternoon or later that way morning meds have had time to take effects.  Letta Raw MD

## 2023-08-10 NOTE — Progress Notes (Signed)
 Notified patient via Clinical cytogeneticist

## 2023-08-11 DIAGNOSIS — H353124 Nonexudative age-related macular degeneration, left eye, advanced atrophic with subfoveal involvement: Secondary | ICD-10-CM | POA: Diagnosis not present

## 2023-08-11 DIAGNOSIS — H353211 Exudative age-related macular degeneration, right eye, with active choroidal neovascularization: Secondary | ICD-10-CM | POA: Diagnosis not present

## 2023-08-11 DIAGNOSIS — H353113 Nonexudative age-related macular degeneration, right eye, advanced atrophic without subfoveal involvement: Secondary | ICD-10-CM | POA: Diagnosis not present

## 2023-08-11 DIAGNOSIS — H43813 Vitreous degeneration, bilateral: Secondary | ICD-10-CM | POA: Diagnosis not present

## 2023-08-12 DIAGNOSIS — Z1321 Encounter for screening for nutritional disorder: Secondary | ICD-10-CM | POA: Diagnosis not present

## 2023-08-12 DIAGNOSIS — E039 Hypothyroidism, unspecified: Secondary | ICD-10-CM | POA: Diagnosis not present

## 2023-08-12 DIAGNOSIS — E7849 Other hyperlipidemia: Secondary | ICD-10-CM | POA: Diagnosis not present

## 2023-08-12 DIAGNOSIS — D559 Anemia due to enzyme disorder, unspecified: Secondary | ICD-10-CM | POA: Diagnosis not present

## 2023-08-12 DIAGNOSIS — E119 Type 2 diabetes mellitus without complications: Secondary | ICD-10-CM | POA: Diagnosis not present

## 2023-08-12 DIAGNOSIS — N1831 Chronic kidney disease, stage 3a: Secondary | ICD-10-CM | POA: Diagnosis not present

## 2023-08-19 DIAGNOSIS — Z1331 Encounter for screening for depression: Secondary | ICD-10-CM | POA: Diagnosis not present

## 2023-08-19 DIAGNOSIS — Z1389 Encounter for screening for other disorder: Secondary | ICD-10-CM | POA: Diagnosis not present

## 2023-08-19 DIAGNOSIS — Z0001 Encounter for general adult medical examination with abnormal findings: Secondary | ICD-10-CM | POA: Diagnosis not present

## 2023-08-19 DIAGNOSIS — D649 Anemia, unspecified: Secondary | ICD-10-CM | POA: Diagnosis not present

## 2023-08-19 DIAGNOSIS — N1831 Chronic kidney disease, stage 3a: Secondary | ICD-10-CM | POA: Diagnosis not present

## 2023-08-19 DIAGNOSIS — E039 Hypothyroidism, unspecified: Secondary | ICD-10-CM | POA: Diagnosis not present

## 2023-08-19 DIAGNOSIS — Z6835 Body mass index (BMI) 35.0-35.9, adult: Secondary | ICD-10-CM | POA: Diagnosis not present

## 2023-09-09 DIAGNOSIS — H353113 Nonexudative age-related macular degeneration, right eye, advanced atrophic without subfoveal involvement: Secondary | ICD-10-CM | POA: Diagnosis not present

## 2023-09-09 DIAGNOSIS — H353124 Nonexudative age-related macular degeneration, left eye, advanced atrophic with subfoveal involvement: Secondary | ICD-10-CM | POA: Diagnosis not present

## 2023-09-09 DIAGNOSIS — H353211 Exudative age-related macular degeneration, right eye, with active choroidal neovascularization: Secondary | ICD-10-CM | POA: Diagnosis not present

## 2023-09-09 DIAGNOSIS — H43813 Vitreous degeneration, bilateral: Secondary | ICD-10-CM | POA: Diagnosis not present

## 2023-09-23 DIAGNOSIS — N1831 Chronic kidney disease, stage 3a: Secondary | ICD-10-CM | POA: Diagnosis not present

## 2023-09-23 DIAGNOSIS — K219 Gastro-esophageal reflux disease without esophagitis: Secondary | ICD-10-CM | POA: Diagnosis not present

## 2023-09-23 DIAGNOSIS — E1122 Type 2 diabetes mellitus with diabetic chronic kidney disease: Secondary | ICD-10-CM | POA: Diagnosis not present

## 2023-09-23 DIAGNOSIS — I10 Essential (primary) hypertension: Secondary | ICD-10-CM | POA: Diagnosis not present

## 2023-09-23 DIAGNOSIS — E039 Hypothyroidism, unspecified: Secondary | ICD-10-CM | POA: Diagnosis not present

## 2023-09-23 DIAGNOSIS — J309 Allergic rhinitis, unspecified: Secondary | ICD-10-CM | POA: Diagnosis not present

## 2023-09-23 DIAGNOSIS — I482 Chronic atrial fibrillation, unspecified: Secondary | ICD-10-CM | POA: Diagnosis not present

## 2023-09-23 DIAGNOSIS — I5032 Chronic diastolic (congestive) heart failure: Secondary | ICD-10-CM | POA: Diagnosis not present

## 2023-09-23 DIAGNOSIS — K5909 Other constipation: Secondary | ICD-10-CM | POA: Diagnosis not present

## 2023-09-23 DIAGNOSIS — F331 Major depressive disorder, recurrent, moderate: Secondary | ICD-10-CM | POA: Diagnosis not present

## 2023-09-23 DIAGNOSIS — E782 Mixed hyperlipidemia: Secondary | ICD-10-CM | POA: Diagnosis not present

## 2023-09-23 DIAGNOSIS — R3915 Urgency of urination: Secondary | ICD-10-CM | POA: Diagnosis not present

## 2023-09-30 ENCOUNTER — Encounter: Payer: Self-pay | Admitting: Cardiology

## 2023-09-30 MED ORDER — LISINOPRIL 40 MG PO TABS: 40.0000 mg | ORAL_TABLET | Freq: Every morning | ORAL | 2 refills | Status: AC

## 2023-10-08 DIAGNOSIS — I10 Essential (primary) hypertension: Secondary | ICD-10-CM | POA: Diagnosis not present

## 2023-10-08 DIAGNOSIS — E782 Mixed hyperlipidemia: Secondary | ICD-10-CM | POA: Diagnosis not present

## 2023-10-08 DIAGNOSIS — I482 Chronic atrial fibrillation, unspecified: Secondary | ICD-10-CM | POA: Diagnosis not present

## 2023-10-08 DIAGNOSIS — E1122 Type 2 diabetes mellitus with diabetic chronic kidney disease: Secondary | ICD-10-CM | POA: Diagnosis not present

## 2023-10-13 DIAGNOSIS — F331 Major depressive disorder, recurrent, moderate: Secondary | ICD-10-CM | POA: Diagnosis not present

## 2023-10-13 DIAGNOSIS — I482 Chronic atrial fibrillation, unspecified: Secondary | ICD-10-CM | POA: Diagnosis not present

## 2023-10-13 DIAGNOSIS — K219 Gastro-esophageal reflux disease without esophagitis: Secondary | ICD-10-CM | POA: Diagnosis not present

## 2023-10-13 DIAGNOSIS — R3915 Urgency of urination: Secondary | ICD-10-CM | POA: Diagnosis not present

## 2023-10-13 DIAGNOSIS — J309 Allergic rhinitis, unspecified: Secondary | ICD-10-CM | POA: Diagnosis not present

## 2023-10-13 DIAGNOSIS — E1122 Type 2 diabetes mellitus with diabetic chronic kidney disease: Secondary | ICD-10-CM | POA: Diagnosis not present

## 2023-10-13 DIAGNOSIS — K5909 Other constipation: Secondary | ICD-10-CM | POA: Diagnosis not present

## 2023-10-13 DIAGNOSIS — I5032 Chronic diastolic (congestive) heart failure: Secondary | ICD-10-CM | POA: Diagnosis not present

## 2023-10-13 DIAGNOSIS — E782 Mixed hyperlipidemia: Secondary | ICD-10-CM | POA: Diagnosis not present

## 2023-10-13 DIAGNOSIS — N1831 Chronic kidney disease, stage 3a: Secondary | ICD-10-CM | POA: Diagnosis not present

## 2023-10-13 DIAGNOSIS — I10 Essential (primary) hypertension: Secondary | ICD-10-CM | POA: Diagnosis not present

## 2023-10-13 DIAGNOSIS — E039 Hypothyroidism, unspecified: Secondary | ICD-10-CM | POA: Diagnosis not present

## 2023-10-14 DIAGNOSIS — H353211 Exudative age-related macular degeneration, right eye, with active choroidal neovascularization: Secondary | ICD-10-CM | POA: Diagnosis not present

## 2023-10-22 DIAGNOSIS — L84 Corns and callosities: Secondary | ICD-10-CM | POA: Diagnosis not present

## 2023-10-22 DIAGNOSIS — B351 Tinea unguium: Secondary | ICD-10-CM | POA: Diagnosis not present

## 2023-10-22 DIAGNOSIS — E1151 Type 2 diabetes mellitus with diabetic peripheral angiopathy without gangrene: Secondary | ICD-10-CM | POA: Diagnosis not present

## 2023-10-26 DIAGNOSIS — H5201 Hypermetropia, right eye: Secondary | ICD-10-CM | POA: Diagnosis not present

## 2023-10-26 DIAGNOSIS — H353130 Nonexudative age-related macular degeneration, bilateral, stage unspecified: Secondary | ICD-10-CM | POA: Diagnosis not present

## 2023-10-26 DIAGNOSIS — H5212 Myopia, left eye: Secondary | ICD-10-CM | POA: Diagnosis not present

## 2023-10-26 DIAGNOSIS — H52221 Regular astigmatism, right eye: Secondary | ICD-10-CM | POA: Diagnosis not present

## 2023-10-26 DIAGNOSIS — Z961 Presence of intraocular lens: Secondary | ICD-10-CM | POA: Diagnosis not present

## 2023-10-26 DIAGNOSIS — H5231 Anisometropia: Secondary | ICD-10-CM | POA: Diagnosis not present

## 2023-10-26 DIAGNOSIS — H524 Presbyopia: Secondary | ICD-10-CM | POA: Diagnosis not present

## 2023-10-27 ENCOUNTER — Other Ambulatory Visit: Payer: Self-pay | Admitting: *Deleted

## 2023-10-27 ENCOUNTER — Encounter: Payer: Self-pay | Admitting: Cardiology

## 2023-10-27 MED ORDER — ELIQUIS 5 MG PO TABS: 5.0000 mg | ORAL_TABLET | Freq: Two times a day (BID) | ORAL | 1 refills | Status: DC

## 2023-10-27 NOTE — Telephone Encounter (Signed)
 Prescription refill request for Miranda Wilkinson  received. Indication: AF Last office visit: 06/22/23  Miranda Ross MD Scr: 1.28 on 11/19/22  Epic Age: 88 Weight: 101.5kg  Based on above findings Miranda Wilkinson  5mg  twice daily is the appropriate dose.  Refill approved.

## 2023-11-02 ENCOUNTER — Encounter: Payer: Self-pay | Admitting: Cardiology

## 2023-11-02 MED ORDER — BISOPROLOL FUMARATE 10 MG PO TABS: 20.0000 mg | ORAL_TABLET | Freq: Every day | ORAL | 2 refills | Status: AC

## 2023-11-09 DIAGNOSIS — Z23 Encounter for immunization: Secondary | ICD-10-CM | POA: Diagnosis not present

## 2023-11-10 DIAGNOSIS — I10 Essential (primary) hypertension: Secondary | ICD-10-CM | POA: Diagnosis not present

## 2023-11-10 DIAGNOSIS — F331 Major depressive disorder, recurrent, moderate: Secondary | ICD-10-CM | POA: Diagnosis not present

## 2023-11-10 DIAGNOSIS — R3915 Urgency of urination: Secondary | ICD-10-CM | POA: Diagnosis not present

## 2023-11-10 DIAGNOSIS — N1831 Chronic kidney disease, stage 3a: Secondary | ICD-10-CM | POA: Diagnosis not present

## 2023-11-10 DIAGNOSIS — I482 Chronic atrial fibrillation, unspecified: Secondary | ICD-10-CM | POA: Diagnosis not present

## 2023-11-10 DIAGNOSIS — K219 Gastro-esophageal reflux disease without esophagitis: Secondary | ICD-10-CM | POA: Diagnosis not present

## 2023-11-10 DIAGNOSIS — K5909 Other constipation: Secondary | ICD-10-CM | POA: Diagnosis not present

## 2023-11-10 DIAGNOSIS — E1122 Type 2 diabetes mellitus with diabetic chronic kidney disease: Secondary | ICD-10-CM | POA: Diagnosis not present

## 2023-11-10 DIAGNOSIS — J309 Allergic rhinitis, unspecified: Secondary | ICD-10-CM | POA: Diagnosis not present

## 2023-11-10 DIAGNOSIS — I5032 Chronic diastolic (congestive) heart failure: Secondary | ICD-10-CM | POA: Diagnosis not present

## 2023-11-10 DIAGNOSIS — E782 Mixed hyperlipidemia: Secondary | ICD-10-CM | POA: Diagnosis not present

## 2023-11-10 DIAGNOSIS — E039 Hypothyroidism, unspecified: Secondary | ICD-10-CM | POA: Diagnosis not present

## 2023-11-20 DIAGNOSIS — H353211 Exudative age-related macular degeneration, right eye, with active choroidal neovascularization: Secondary | ICD-10-CM | POA: Diagnosis not present

## 2023-11-20 DIAGNOSIS — H353124 Nonexudative age-related macular degeneration, left eye, advanced atrophic with subfoveal involvement: Secondary | ICD-10-CM | POA: Diagnosis not present

## 2023-11-20 DIAGNOSIS — H43813 Vitreous degeneration, bilateral: Secondary | ICD-10-CM | POA: Diagnosis not present

## 2023-11-20 DIAGNOSIS — H353113 Nonexudative age-related macular degeneration, right eye, advanced atrophic without subfoveal involvement: Secondary | ICD-10-CM | POA: Diagnosis not present

## 2023-12-04 DIAGNOSIS — N1831 Chronic kidney disease, stage 3a: Secondary | ICD-10-CM | POA: Diagnosis not present

## 2023-12-04 DIAGNOSIS — I10 Essential (primary) hypertension: Secondary | ICD-10-CM | POA: Diagnosis not present

## 2023-12-04 DIAGNOSIS — K5909 Other constipation: Secondary | ICD-10-CM | POA: Diagnosis not present

## 2023-12-04 DIAGNOSIS — E1122 Type 2 diabetes mellitus with diabetic chronic kidney disease: Secondary | ICD-10-CM | POA: Diagnosis not present

## 2023-12-04 DIAGNOSIS — I5032 Chronic diastolic (congestive) heart failure: Secondary | ICD-10-CM | POA: Diagnosis not present

## 2023-12-04 DIAGNOSIS — R3915 Urgency of urination: Secondary | ICD-10-CM | POA: Diagnosis not present

## 2023-12-04 DIAGNOSIS — E782 Mixed hyperlipidemia: Secondary | ICD-10-CM | POA: Diagnosis not present

## 2023-12-04 DIAGNOSIS — K219 Gastro-esophageal reflux disease without esophagitis: Secondary | ICD-10-CM | POA: Diagnosis not present

## 2023-12-04 DIAGNOSIS — I482 Chronic atrial fibrillation, unspecified: Secondary | ICD-10-CM | POA: Diagnosis not present

## 2023-12-04 DIAGNOSIS — J309 Allergic rhinitis, unspecified: Secondary | ICD-10-CM | POA: Diagnosis not present

## 2023-12-04 DIAGNOSIS — E039 Hypothyroidism, unspecified: Secondary | ICD-10-CM | POA: Diagnosis not present

## 2023-12-04 DIAGNOSIS — F331 Major depressive disorder, recurrent, moderate: Secondary | ICD-10-CM | POA: Diagnosis not present

## 2023-12-18 DIAGNOSIS — H353211 Exudative age-related macular degeneration, right eye, with active choroidal neovascularization: Secondary | ICD-10-CM | POA: Diagnosis not present

## 2023-12-22 DIAGNOSIS — R300 Dysuria: Secondary | ICD-10-CM | POA: Diagnosis not present

## 2023-12-22 DIAGNOSIS — E119 Type 2 diabetes mellitus without complications: Secondary | ICD-10-CM | POA: Diagnosis not present

## 2023-12-22 DIAGNOSIS — E7849 Other hyperlipidemia: Secondary | ICD-10-CM | POA: Diagnosis not present

## 2023-12-22 DIAGNOSIS — N1831 Chronic kidney disease, stage 3a: Secondary | ICD-10-CM | POA: Diagnosis not present

## 2023-12-30 DIAGNOSIS — N1831 Chronic kidney disease, stage 3a: Secondary | ICD-10-CM | POA: Diagnosis not present

## 2023-12-30 DIAGNOSIS — G4733 Obstructive sleep apnea (adult) (pediatric): Secondary | ICD-10-CM | POA: Diagnosis not present

## 2023-12-30 DIAGNOSIS — J9611 Chronic respiratory failure with hypoxia: Secondary | ICD-10-CM | POA: Diagnosis not present

## 2023-12-30 DIAGNOSIS — D649 Anemia, unspecified: Secondary | ICD-10-CM | POA: Diagnosis not present

## 2023-12-30 DIAGNOSIS — F331 Major depressive disorder, recurrent, moderate: Secondary | ICD-10-CM | POA: Diagnosis not present

## 2023-12-30 DIAGNOSIS — I482 Chronic atrial fibrillation, unspecified: Secondary | ICD-10-CM | POA: Diagnosis not present

## 2023-12-30 DIAGNOSIS — Z23 Encounter for immunization: Secondary | ICD-10-CM | POA: Diagnosis not present

## 2023-12-30 DIAGNOSIS — Z6835 Body mass index (BMI) 35.0-35.9, adult: Secondary | ICD-10-CM | POA: Diagnosis not present

## 2023-12-30 DIAGNOSIS — L400 Psoriasis vulgaris: Secondary | ICD-10-CM | POA: Diagnosis not present

## 2023-12-30 DIAGNOSIS — I10 Essential (primary) hypertension: Secondary | ICD-10-CM | POA: Diagnosis not present

## 2023-12-30 DIAGNOSIS — J449 Chronic obstructive pulmonary disease, unspecified: Secondary | ICD-10-CM | POA: Diagnosis not present

## 2023-12-30 DIAGNOSIS — E039 Hypothyroidism, unspecified: Secondary | ICD-10-CM | POA: Diagnosis not present

## 2024-01-20 DIAGNOSIS — H353113 Nonexudative age-related macular degeneration, right eye, advanced atrophic without subfoveal involvement: Secondary | ICD-10-CM | POA: Diagnosis not present

## 2024-01-20 DIAGNOSIS — H43813 Vitreous degeneration, bilateral: Secondary | ICD-10-CM | POA: Diagnosis not present

## 2024-01-20 DIAGNOSIS — H353124 Nonexudative age-related macular degeneration, left eye, advanced atrophic with subfoveal involvement: Secondary | ICD-10-CM | POA: Diagnosis not present

## 2024-01-20 DIAGNOSIS — H353211 Exudative age-related macular degeneration, right eye, with active choroidal neovascularization: Secondary | ICD-10-CM | POA: Diagnosis not present

## 2024-01-21 ENCOUNTER — Ambulatory Visit: Attending: Cardiology | Admitting: Cardiology

## 2024-01-21 ENCOUNTER — Encounter: Payer: Self-pay | Admitting: Cardiology

## 2024-01-21 VITALS — BP 124/78 | HR 84 | Ht 65.0 in | Wt 219.4 lb

## 2024-01-21 DIAGNOSIS — D6869 Other thrombophilia: Secondary | ICD-10-CM | POA: Diagnosis not present

## 2024-01-21 DIAGNOSIS — I4821 Permanent atrial fibrillation: Secondary | ICD-10-CM | POA: Diagnosis not present

## 2024-01-21 DIAGNOSIS — E782 Mixed hyperlipidemia: Secondary | ICD-10-CM | POA: Insufficient documentation

## 2024-01-21 DIAGNOSIS — I10 Essential (primary) hypertension: Secondary | ICD-10-CM | POA: Insufficient documentation

## 2024-01-21 NOTE — Progress Notes (Signed)
 Clinical Summary Ms. Muramoto is a 88 y.o.female seen today for follow up of the following medical problems.    1. Permanent afib - afib noted by 01/26/2019 preop ekg - issues with afib with RVR during 10/2019 clinic visit, referred to afib clinic - admitted 10/24/19 for tikosyn  initiation. Loaded with attempted cardioversion 10/27/19 but failed - at EP f/u 8/26 plans were for repeat cardioversion, if fails would consider amiodarone  (minimal COPD according to pulm note). Appears admitted with anemia prior to cardioversion, EKG during that admission 11/04/19 showed NSR - dilt stopped 10/27/19 after tikosyn  loading, remained on bisoprolol       - admission 12/2019 with COPD exacerbation and UTI, issues with afib with RVR.  - tikosyn  was stopped due to labile renal function, Cr up to 1.9 on admission  - seen by EP. Poor ablation candidate. Started on amio that visit   - from last EP note failed amio, deemed as having permanent afib with plans for just rate control.   -first thing in morning some flutterins, lasts 2-3 minutes. Otherwise no symtoms - no bleeding on eliquis .    2. Anemia - admitted 10/2019 with symptomatic anemia. Hgb down to 6.5, given 1 unit prbcs - has not been a recurrent issue     3. Hard of hearing   4. Chronic diastolic HF - no SOB/DOE, denies any edema   5. HTN - off HCTZ and lisinopril  since 01/2022 admission with hip fracture and surgery  - compliant with meds -  6. Hyperlipidemia 11/2020 TC 137 TG 250 HDL 54 LDL 44 - Jan 2025 TC 166 TG 262 HDL 49 LDL 75 - reports labs in Jan with pcp  08/2023 TC 145 HDL 56 TG 179 LDL 53     7. Hip fracture - admission 01/2022 with mechanical fall, hip fracture.  - had surgery during admit Past Medical History:  Diagnosis Date   Allergy    seasonal allergies   Anemia    IDA- on iron  supplement   Anxiety    Atrial fibrillation (HCC)    Blood transfusion without reported diagnosis    2021- due to IDA/ low hemo  (7.3)   Cancer (HCC) 1976   bilaterl thyroid  ca   Diabetes mellitus without complication (HCC)    on meds   GERD (gastroesophageal reflux disease)    on meds---OTC   Headache(784.0)    hx migraines   Hyperlipidemia    on meds   Hypertension    on meds   Hypothyroidism    thyroidectomy in 1976- on meds at  this time   On supplemental oxygen  therapy    2 L at night   Shortness of breath    Sinus congestion    Sleep apnea    study done in [place], [place], use CPAP----with home O2 concentrater at night with CPAP     No Known Allergies   Current Outpatient Medications  Medication Sig Dispense Refill   acetaminophen  (TYLENOL ) 500 MG tablet Take 500 mg by mouth every 6 (six) hours as needed (for pain.).     bisoprolol  (ZEBETA ) 10 MG tablet Take 2 tablets (20 mg total) by mouth daily. 180 tablet 2   Cholecalciferol  (VITAMIN D3) 50 MCG (2000 UT) capsule Take 2,000 Units by mouth daily.     ciclopirox (PENLAC) 8 % solution Apply 1 application  topically daily.     citalopram  (CELEXA ) 20 MG tablet Take 20 mg by mouth daily.  ELIQUIS  5 MG TABS tablet Take 1 tablet (5 mg total) by mouth 2 (two) times daily. 180 tablet 1   glipiZIDE  2.5 MG TABS Take 2.5 mg by mouth daily.     GUAIFENESIN 1200 PO Take 1,200 mg by mouth 2 (two) times daily as needed (sinus congestion.).     lisinopril  (ZESTRIL ) 40 MG tablet Take 1 tablet (40 mg total) by mouth every morning. 90 tablet 2   loratadine  (CLARITIN ) 10 MG tablet Take 1 tablet by mouth daily as needed for allergies.     melatonin 5 MG TABS Take 2.5-5 mg by mouth at bedtime as needed (sleep).      Multiple Vitamins-Minerals (PRESERVISION AREDS) TABS Take 1 tablet by mouth daily.     MYRBETRIQ 50 MG TB24 tablet Take 50 mg by mouth daily.     omeprazole  (PRILOSEC) 20 MG capsule Take 20 mg by mouth daily as needed (acid reflux/indigestion.).     Pegcetacoplan, Ophthalmic, 15 MG/0.1ML SOLN Injection in right eye every two months     simvastatin  (ZOCOR )  20 MG tablet Take 20 mg by mouth daily.     SYNTHROID  125 MCG tablet Take 125 mcg by mouth daily.     terbinafine (LAMISIL) 250 MG tablet Take 250 mg by mouth daily.     triamcinolone cream (KENALOG) 0.1 % Apply 1 application topically 2 (two) times daily as needed (psoriasis).     GEMTESA 75 MG TABS Take 1 tablet by mouth daily. (Patient not taking: Reported on 01/21/2024)     Multiple Vitamins-Minerals (MULTIVITAMIN WITH MINERALS) tablet Take 1 tablet by mouth daily.     No current facility-administered medications for this visit.     Past Surgical History:  Procedure Laterality Date   APPENDECTOMY     CARDIOVERSION N/A 10/27/2019   Procedure: CARDIOVERSION;  Surgeon: Kate Lonni CROME, MD;  Location: [facility] ENDOSCOPY;  Service: Cardiovascular;  Laterality: N/A;   CARDIOVERSION N/A 04/25/2020   Procedure: CARDIOVERSION;  Surgeon: Hobart Powell BRAVO, MD;  Location: [facility] ENDOSCOPY;  Service: Cardiovascular;  Laterality: N/A;   CHOLECYSTECTOMY     COLONOSCOPY WITH PROPOFOL  N/A 01/09/2020   Procedure: COLONOSCOPY WITH PROPOFOL ;  Surgeon: Legrand Victory CROME DOUGLAS, MD;  Location: [facility] ENDOSCOPY;  Service: Gastroenterology;  Laterality: N/A;   ESOPHAGOGASTRODUODENOSCOPY (EGD) WITH PROPOFOL  N/A 01/09/2020   Procedure: ESOPHAGOGASTRODUODENOSCOPY (EGD) WITH PROPOFOL ;  Surgeon: Legrand Victory CROME DOUGLAS, MD;  Location: [facility] ENDOSCOPY;  Service: Gastroenterology;  Laterality: N/A;   HEMOSTASIS CLIP PLACEMENT  01/09/2020   Procedure: HEMOSTASIS CLIP PLACEMENT;  Surgeon: Legrand Victory CROME DOUGLAS, MD;  Location: [facility] ENDOSCOPY;  Service: Gastroenterology;;   OPEN REDUCTION INTERNAL FIXATION (ORIF) TIBIA/FIBULA FRACTURE Right 11/03/2013   Procedure: OPEN REDUCTION INTERNAL FIXATION (ORIF) RIGHT TIBIA/FIBULA PILON FRACTURE;  Surgeon: Norleen Armor, MD;  Location: [facility] OR;  Service: Orthopedics;  Laterality: Right;   OVARIAN CYST SURGERY Left    age 27   POLYPECTOMY  01/09/2020   Procedure: POLYPECTOMY;  Surgeon: Legrand Victory CROME DOUGLAS,  MD;  Location: [facility];  Service: Gastroenterology;;   THYROIDECTOMY Bilateral 1976   TOTAL HIP ARTHROPLASTY Right 01/30/2022   Procedure: TOTAL HIP ARTHROPLASTY ANTERIOR APPROACH;  Surgeon: Ernie Cough, MD;  Location: [facility] ORS;  Service: Orthopedics;  Laterality: Right;   TUBAL LIGATION       No Known Allergies    Family History  Problem Relation Age of Onset   Stroke Mother        brain hemmorhage   Diabetes Mother    Heart  disease Mother    Thyroid  disease Mother    Arrhythmia Mother    Stroke Father    Heart attack Father        blood clot in brain   Stroke Sister    Cancer Sister    Stomach cancer Brother 23   Hypertension Brother    Esophageal cancer Brother 16   Stroke Brother    Hypertension Brother    Arrhythmia Sister    Hypertension Sister    Colon polyps Neg Hx    Colon cancer Neg Hx    Rectal cancer Neg Hx      Social History [name] reports that she quit smoking about 12 years ago. Her smoking use included cigarettes. She started smoking about 72 years ago. She has a 90 pack-year smoking history. She has never been exposed to tobacco smoke. She has never used smokeless tobacco. Ms. Diekmann reports no history of alcohol  use.    Physical Examination Vitals:   01/21/24 1338 01/21/24 1407  BP: (!) 154/90 124/78  Pulse: 84   SpO2: 96%    Filed Weights   01/21/24 1338  Weight: 219 lb 6.4 oz (99.5 kg)    Gen: resting comfortably, no acute distress HEENT: no scleral icterus, pupils equal round and reactive, no palptable cervical adenopathy,  CV: RRR, no m/rg, no jvd Resp: Clear to auscultation bilaterally GI: abdomen is soft, non-tender, non-distended, normal bowel sounds, no hepatosplenomegaly MSK: extremities are warm, no edema.  Skin: warm, no rash Neuro:  no focal deficits Psych: appropriate affect   Diagnostic Studies  02/2019 echo IMPRESSIONS     1. Left ventricular ejection fraction, by visual estimation, is 70 to  75%. The  left ventricle has hyperdynamic function. There is no left  ventricular hypertrophy.   2. Left ventricular diastolic parameters are indeterminate.   3. The left ventricle has no regional wall motion abnormalities.   4. Global right ventricle has normal systolic function.The right  ventricular size is normal. No increase in right ventricular wall  thickness.   5. Left atrial size was normal.   6. Right atrial size was normal.   7. The mitral valve is normal in structure. Trivial mitral valve  regurgitation. No evidence of mitral stenosis.   8. The tricuspid valve is normal in structure. Tricuspid valve  regurgitation is not demonstrated.   9. The aortic valve has an indeterminant number of cusps. Aortic valve  regurgitation is not visualized. Mild aortic valve sclerosis without  stenosis.  10. The pulmonic valve was not well visualized. Pulmonic valve  regurgitation is not visualized.  11. Normal pulmonary artery systolic pressure.    Assessment and Plan   1. Permanent afib/acquired thrombophilia - off tikosyn  after recent admission with AKI. Failed amio. Deemed poor ablation candidate -doing well with just rate control, continue current meds. Continue eliquis  for stroke prevention    2. HTN -bp at goal, continue current meds     3. Hyperlipidemia -LDL remains at goal, continue current therapy. TGs should improve with improved glucose control, discussed dietary changes    Dorn PHEBE Ross, M.D.

## 2024-01-21 NOTE — Patient Instructions (Signed)
 Medication Instructions:  Continue all current medications.   Labwork: none  Testing/Procedures: none  Follow-Up: 6 months   Any Other Special Instructions Will Be Listed Below (If Applicable).   If you need a refill on your cardiac medications before your next appointment, please call your pharmacy.

## 2024-01-28 DIAGNOSIS — B351 Tinea unguium: Secondary | ICD-10-CM | POA: Diagnosis not present

## 2024-02-12 DIAGNOSIS — H353211 Exudative age-related macular degeneration, right eye, with active choroidal neovascularization: Secondary | ICD-10-CM | POA: Diagnosis not present

## 2024-03-09 ENCOUNTER — Emergency Department (HOSPITAL_BASED_OUTPATIENT_CLINIC_OR_DEPARTMENT_OTHER)
Admission: EM | Admit: 2024-03-09 | Discharge: 2024-03-09 | Disposition: A | Discharge status: Home / Self Care | Attending: Emergency Medicine | Admitting: Emergency Medicine

## 2024-03-09 ENCOUNTER — Emergency Department (HOSPITAL_BASED_OUTPATIENT_CLINIC_OR_DEPARTMENT_OTHER): Admitting: Radiology

## 2024-03-09 ENCOUNTER — Other Ambulatory Visit: Payer: Self-pay

## 2024-03-09 ENCOUNTER — Encounter (HOSPITAL_BASED_OUTPATIENT_CLINIC_OR_DEPARTMENT_OTHER): Payer: Self-pay

## 2024-03-09 DIAGNOSIS — E119 Type 2 diabetes mellitus without complications: Secondary | ICD-10-CM | POA: Diagnosis not present

## 2024-03-09 DIAGNOSIS — I4891 Unspecified atrial fibrillation: Secondary | ICD-10-CM | POA: Diagnosis not present

## 2024-03-09 DIAGNOSIS — W19XXXA Unspecified fall, initial encounter: Secondary | ICD-10-CM | POA: Insufficient documentation

## 2024-03-09 DIAGNOSIS — S62114A Nondisplaced fracture of triquetrum [cuneiform] bone, right wrist, initial encounter for closed fracture: Secondary | ICD-10-CM | POA: Insufficient documentation

## 2024-03-09 DIAGNOSIS — I10 Essential (primary) hypertension: Secondary | ICD-10-CM | POA: Insufficient documentation

## 2024-03-09 DIAGNOSIS — Z7901 Long term (current) use of anticoagulants: Secondary | ICD-10-CM | POA: Diagnosis not present

## 2024-03-09 DIAGNOSIS — S62101A Fracture of unspecified carpal bone, right wrist, initial encounter for closed fracture: Secondary | ICD-10-CM

## 2024-03-09 DIAGNOSIS — M79641 Pain in right hand: Secondary | ICD-10-CM | POA: Diagnosis present

## 2024-03-09 NOTE — ED Triage Notes (Signed)
 Pt family reports pt fell x2 days ago. Pt reports R wrist/hand pain. Pt reports taking eliquis . Pt denies any head injury.

## 2024-03-09 NOTE — ED Provider Notes (Signed)
 " [facility] AT [facility] Provider Note   CSN: [id number] Arrival date & time: 03/09/24  2047     Patient presents with: Miranda Wilkinson is a 88 y.o. female.   Patient is an 88 year old female with history of atrial fibrillation on Eliquis , type 2 diabetes, hyperlipidemia, hypertension.  Patient presenting today for evaluation of a fall.  Patient fell 2 days ago at her home and injured her right hand and wrist.  She has had increased swelling since.  She denies head trauma or loss of consciousness.  No headache.  No other complaints.       Prior to Admission medications  Medication Sig Start Date End Date Taking? Authorizing Provider  acetaminophen  (TYLENOL ) 500 MG tablet Take 500 mg by mouth every 6 (six) hours as needed (for pain.).    [provider]  bisoprolol  (ZEBETA ) 10 MG tablet Take 2 tablets (20 mg total) by mouth daily. 11/02/23   Alvan Dorn FALCON, MD  Cholecalciferol  (VITAMIN D3) 50 MCG (2000 UT) capsule Take 2,000 Units by mouth daily.    [provider]  ciclopirox (PENLAC) 8 % solution Apply 1 application  topically daily. 12/30/23   [provider]  citalopram  (CELEXA ) 20 MG tablet Take 20 mg by mouth daily. 07/17/23   [provider]  ELIQUIS  5 MG TABS tablet Take 1 tablet (5 mg total) by mouth 2 (two) times daily. 10/27/23   Alvan Dorn FALCON, MD  GEMTESA 75 MG TABS Take 1 tablet by mouth daily. Patient not taking: Reported on 01/21/2024    [provider]  glipiZIDE  2.5 MG TABS Take 2.5 mg by mouth daily.    [provider]  GUAIFENESIN 1200 PO Take 1,200 mg by mouth 2 (two) times daily as needed (sinus congestion.).    [provider]  lisinopril  (ZESTRIL ) 40 MG tablet Take 1 tablet (40 mg total) by mouth every morning. 09/30/23   Alvan Dorn FALCON, MD  loratadine  (CLARITIN ) 10 MG tablet Take 1 tablet by mouth daily as needed for allergies.    [provider]  melatonin 5 MG TABS Take 2.5-5 mg by mouth at bedtime as needed (sleep).     [provider]  Multiple Vitamins-Minerals (MULTIVITAMIN WITH MINERALS) tablet Take 1 tablet by mouth daily.    [provider]  Multiple Vitamins-Minerals (PRESERVISION AREDS) TABS Take 1 tablet by mouth daily.    [provider]  MYRBETRIQ 50 MG TB24 tablet Take 50 mg by mouth daily. 06/13/23   [provider]  omeprazole  (PRILOSEC) 20 MG capsule Take 20 mg by mouth daily as needed (acid reflux/indigestion.).    [provider]  Pegcetacoplan, Ophthalmic, 15 MG/0.1ML SOLN Injection in right eye every two months    [provider]  simvastatin  (ZOCOR ) 20 MG tablet Take 20 mg by mouth daily.    [provider]  SYNTHROID  125 MCG tablet Take 125 mcg by mouth daily.    [provider]  terbinafine (LAMISIL) 250 MG tablet Take 250 mg by mouth daily.    [provider]  triamcinolone cream (KENALOG) 0.1 % Apply 1 application topically 2 (two) times daily as needed (psoriasis). 12/19/19   [provider]    Allergies: Patient has no known allergies.    Review of Systems  All other systems reviewed and are negative.   Updated Vital Signs BP 139/87 (BP Location: Right Arm)   Pulse 85   Temp ROLLEN)  97.4 F (36.3 C)   Resp 19   Ht 5' 5 (1.651 m)   Wt 99 kg   SpO2 90%   BMI 36.32 kg/m   Physical Exam Vitals and nursing note reviewed.  HENT:     Head: Normocephalic.  Pulmonary:     Effort: Pulmonary effort is normal.  Musculoskeletal:     Comments: The right wrist is tender to palpation.  There is swelling and ecchymosis noted to the wrist extending into the hand.  She has good range of motion of all fingers.  Sensation is intact all fingers and capillary refill is brisk.  Skin:    General: Skin is warm and dry.  Neurological:     Mental Status: She is alert and oriented to person, place, and time.     (all labs  ordered are listed, but only abnormal results are displayed) Labs Reviewed - No data to display  EKG: None  Radiology: DG Wrist Complete Right Result Date: 03/09/2024 EXAM: 3 OR MORE VIEW(S) XRAY OF THE WRIST 03/09/2024 09:21:33 PM COMPARISON: None available. CLINICAL HISTORY: fall FINDINGS: BONES AND JOINTS: Impacted intraarticular fracture of the distal radius with resultant volar tilt of the distal radial articular surface. Fracture extends to the radiocarpal joint. Irregularity involving the medial cortex of the lunate suggests a possible impaction fracture in this region. This will be better assessed with CT imaging. Widening of the distal radioulnar joint noted. Chondrocalcinosis in wrist. No dislocation. SOFT TISSUES: Soft tissue swelling of the wrist. IMPRESSION: 1. Impacted intraarticular fracture of the distal radius with resultant volar tilt of the distal radial articular surface, without dislocation. 2. Irregularity involving the medial cortex of the lunate suggests a possible impaction fracture, better assessed with CT imaging. 3. Widening of the distal radioulnar joint. 4. Soft tissue swelling of the wrist. 5. Chondrocalcinosis in the wrist. Electronically signed by: Dorethia Molt MD 03/09/2024 10:17 PM EST RP Workstation: [id number]   DG Hand Complete Right Result Date: 03/09/2024 EXAM: 3 OR MORE VIEW(S) XRAY OF THE HAND 03/09/2024 09:21:33 PM COMPARISON: None available. CLINICAL HISTORY: fall FINDINGS: BONES AND JOINTS: Nondisplaced intra-articular fracture of the radial styloid. Possible nondisplaced triquetrum fracture. Distal interphalangeal degenerative changes. TFCC chondrocalcinosis. No malalignment. SOFT TISSUES: Extensive soft tissue swelling of the wrist and hand. IMPRESSION: 1. Nondisplaced intra-articular fracture of the radial styloid with possible nondisplaced triquetrum fracture. Dedicated radiographs of the right wrist with close attention to lateral view are recommended  for further evaluation 2. Extensive soft tissue swelling of the wrist and hand. 3. Distal interphalangeal degenerative changes and TFCC chondrocalcinosis. Electronically signed by: Dorethia Molt MD 03/09/2024 10:14 PM EST RP Workstation: [id number]     Procedures   Medications Ordered in the ED - No data to display                                  Medical Decision Making Amount and/or Complexity of Data Reviewed Radiology: ordered.   X-rays show a nondisplaced intra-articular fracture of the radial styloid with possible nondisplaced triquetrum fracture.  Patient will be placed in a volar wrist splint and will follow-up with hand surgery in the next few days.     Final diagnoses:  None    ED Discharge Orders     None          Geroldine Berg, MD 03/09/24 2316  "

## 2024-03-09 NOTE — Discharge Instructions (Signed)
 Wear splint until followed up by orthopedics.  Follow-up with hand surgery in the next 2 to 3 days.  The contact information for Dr. Camella has been provided in this discharge summary for you to call and make these arrangements.

## 2024-04-01 ENCOUNTER — Emergency Department (HOSPITAL_BASED_OUTPATIENT_CLINIC_OR_DEPARTMENT_OTHER): Admission: EM | Admit: 2024-04-01 | Discharge: 2024-04-01 | Disposition: A | Discharge status: Home / Self Care

## 2024-04-01 ENCOUNTER — Emergency Department (HOSPITAL_BASED_OUTPATIENT_CLINIC_OR_DEPARTMENT_OTHER)

## 2024-04-01 ENCOUNTER — Other Ambulatory Visit: Payer: Self-pay

## 2024-04-01 DIAGNOSIS — S6992XA Unspecified injury of left wrist, hand and finger(s), initial encounter: Secondary | ICD-10-CM | POA: Diagnosis present

## 2024-04-01 DIAGNOSIS — N390 Urinary tract infection, site not specified: Secondary | ICD-10-CM | POA: Insufficient documentation

## 2024-04-01 DIAGNOSIS — A499 Bacterial infection, unspecified: Secondary | ICD-10-CM

## 2024-04-01 DIAGNOSIS — S0990XA Unspecified injury of head, initial encounter: Secondary | ICD-10-CM | POA: Diagnosis not present

## 2024-04-01 DIAGNOSIS — W19XXXA Unspecified fall, initial encounter: Secondary | ICD-10-CM

## 2024-04-01 DIAGNOSIS — S52615A Nondisplaced fracture of left ulna styloid process, initial encounter for closed fracture: Secondary | ICD-10-CM | POA: Diagnosis not present

## 2024-04-01 DIAGNOSIS — W01198A Fall on same level from slipping, tripping and stumbling with subsequent striking against other object, initial encounter: Secondary | ICD-10-CM | POA: Insufficient documentation

## 2024-04-01 DIAGNOSIS — M542 Cervicalgia: Secondary | ICD-10-CM | POA: Diagnosis not present

## 2024-04-01 DIAGNOSIS — Z7901 Long term (current) use of anticoagulants: Secondary | ICD-10-CM | POA: Insufficient documentation

## 2024-04-01 LAB — CBC WITH DIFFERENTIAL/PLATELET
Abs Immature Granulocytes: 0.15 K/uL — ABNORMAL HIGH (ref 0.00–0.07)
Basophils Absolute: 0.1 K/uL (ref 0.0–0.1)
Basophils Relative: 1 %
Eosinophils Absolute: 0.1 K/uL (ref 0.0–0.5)
Eosinophils Relative: 1 %
HCT: 48.6 % — ABNORMAL HIGH (ref 36.0–46.0)
Hemoglobin: 16.1 g/dL — ABNORMAL HIGH (ref 12.0–15.0)
Immature Granulocytes: 1 %
Lymphocytes Relative: 7 %
Lymphs Abs: 0.9 K/uL (ref 0.7–4.0)
MCH: 30.9 pg (ref 26.0–34.0)
MCHC: 33.1 g/dL (ref 30.0–36.0)
MCV: 93.3 fL (ref 80.0–100.0)
Monocytes Absolute: 0.7 K/uL (ref 0.1–1.0)
Monocytes Relative: 6 %
Neutro Abs: 11.2 K/uL — ABNORMAL HIGH (ref 1.7–7.7)
Neutrophils Relative %: 84 %
Platelets: 263 K/uL (ref 150–400)
RBC: 5.21 MIL/uL — ABNORMAL HIGH (ref 3.87–5.11)
RDW: 13.8 % (ref 11.5–15.5)
WBC: 13.2 K/uL — ABNORMAL HIGH (ref 4.0–10.5)
nRBC: 0 % (ref 0.0–0.2)

## 2024-04-01 LAB — COMPREHENSIVE METABOLIC PANEL WITH GFR
ALT: 7 U/L (ref 0–44)
AST: 18 U/L (ref 15–41)
Albumin: 4 g/dL (ref 3.5–5.0)
Alkaline Phosphatase: 103 U/L (ref 38–126)
Anion gap: 12 (ref 5–15)
BUN: 16 mg/dL (ref 8–23)
CO2: 27 mmol/L (ref 22–32)
Calcium: 10.8 mg/dL — ABNORMAL HIGH (ref 8.9–10.3)
Chloride: 98 mmol/L (ref 98–111)
Creatinine, Ser: 1.21 mg/dL — ABNORMAL HIGH (ref 0.44–1.00)
GFR, Estimated: 43 mL/min — ABNORMAL LOW
Glucose, Bld: 147 mg/dL — ABNORMAL HIGH (ref 70–99)
Potassium: 4.2 mmol/L (ref 3.5–5.1)
Sodium: 137 mmol/L (ref 135–145)
Total Bilirubin: 0.5 mg/dL (ref 0.0–1.2)
Total Protein: 7.8 g/dL (ref 6.5–8.1)

## 2024-04-01 LAB — URINALYSIS, ROUTINE W REFLEX MICROSCOPIC
Bilirubin Urine: NEGATIVE
Glucose, UA: NEGATIVE mg/dL
Ketones, ur: NEGATIVE mg/dL
Nitrite: NEGATIVE
Protein, ur: >300 mg/dL — AB
Specific Gravity, Urine: 1.013 (ref 1.005–1.030)
WBC, UA: >50 WBC/hpf (ref 0–5)
pH: 6.5 (ref 5.0–8.0)

## 2024-04-01 MED ORDER — ACETAMINOPHEN 325 MG PO TABS
650.0000 mg | ORAL_TABLET | Freq: Once | ORAL | Status: AC
  Administered 2024-04-01: 650 mg via ORAL
  Filled 2024-04-01: qty 2

## 2024-04-01 MED ORDER — CEPHALEXIN 500 MG PO CAPS: 500.0000 mg | ORAL_CAPSULE | Freq: Two times a day (BID) | ORAL | 0 refills | Status: AC

## 2024-04-01 NOTE — Discharge Instructions (Addendum)
 Please follow-up with your hand doctor.  For pain, you can take 1000 mg of Tylenol  or 1 g of Tylenol  every 6-8 hours.  Do not exceed more than 4000 mg or 4 g in a 24-hour period.    Keep the splint dry.  Stay well-hydrated.  If she has any kind of worsening falls or altered mental status please come back to ED.  Please take the antibiotics as prescribed to the UTI.

## 2024-04-01 NOTE — ED Notes (Signed)
7:12 PM Report received from previous RN. This RN assumes care of the patient.

## 2024-04-01 NOTE — ED Provider Notes (Signed)
 " [facility] AT [facility] Provider Note   CSN: [id number] Arrival date & time: 04/01/24  1706     Patient presents with: Head Injury   Miranda Wilkinson is a 89 y.o. female.    Head Injury Associated symptoms: no seizures and no vomiting    Patient presents because of fall.  Fall when walking down the hall.  Unwitnessed.  Patient states that she remembers walking and subsequent felt she was leaning backwards and fell.  Hit her head.  Takes anticoagulation.  No loss consciousness.  Acting at mental baseline.  Endorses some generalized neck pain.  No thoracic or lumbar pain.  No chest pain or shortness of breath.  Otherwise been feeling fine.  Endorsing left wrist pain as well.  Does not follow-up with physical therapy.  Difficulty ambulating at baseline.  Previous medical history reviewed : Patient was last seen in the ED on March 09, 2024.  Fall.  Negative workup at that time     Prior to Admission medications  Medication Sig Start Date End Date Taking? Authorizing Provider  cephALEXin (KEFLEX) 500 MG capsule Take 1 capsule (500 mg total) by mouth 2 (two) times daily for 10 days. 04/01/24 04/11/24 Yes Simon Lavonia SAILOR, MD  acetaminophen  (TYLENOL ) 500 MG tablet Take 500 mg by mouth every 6 (six) hours as needed (for pain.).    [provider]  bisoprolol  (ZEBETA ) 10 MG tablet Take 2 tablets (20 mg total) by mouth daily. 11/02/23   Alvan Dorn FALCON, MD  Cholecalciferol  (VITAMIN D3) 50 MCG (2000 UT) capsule Take 2,000 Units by mouth daily.    [provider]  ciclopirox (PENLAC) 8 % solution Apply 1 application  topically daily. 12/30/23   [provider]  citalopram  (CELEXA ) 20 MG tablet Take 20 mg by mouth daily. 07/17/23   [provider]  ELIQUIS  5 MG TABS tablet Take 1 tablet (5 mg total) by mouth 2 (two) times daily. 10/27/23   Alvan Dorn FALCON, MD  GEMTESA 75 MG TABS Take 1 tablet by mouth daily. Patient not taking:  Reported on 01/21/2024    [provider]  glipiZIDE  2.5 MG TABS Take 2.5 mg by mouth daily.    [provider]  GUAIFENESIN 1200 PO Take 1,200 mg by mouth 2 (two) times daily as needed (sinus congestion.).    [provider]  lisinopril  (ZESTRIL ) 40 MG tablet Take 1 tablet (40 mg total) by mouth every morning. 09/30/23   Alvan Dorn FALCON, MD  loratadine  (CLARITIN ) 10 MG tablet Take 1 tablet by mouth daily as needed for allergies.    [provider]  melatonin 5 MG TABS Take 2.5-5 mg by mouth at bedtime as needed (sleep).     [provider]  Multiple Vitamins-Minerals (MULTIVITAMIN WITH MINERALS) tablet Take 1 tablet by mouth daily.    [provider]  Multiple Vitamins-Minerals (PRESERVISION AREDS) TABS Take 1 tablet by mouth daily.    [provider]  MYRBETRIQ 50 MG TB24 tablet Take 50 mg by mouth daily. 06/13/23   [provider]  omeprazole  (PRILOSEC) 20 MG capsule Take 20 mg by mouth daily as needed (acid reflux/indigestion.).    [provider]  Pegcetacoplan, Ophthalmic, 15 MG/0.1ML SOLN Injection in right eye every two months    [provider]  simvastatin  (ZOCOR ) 20 MG tablet Take 20 mg by mouth daily.    [provider]  SYNTHROID  125 MCG tablet Take 125 mcg by mouth  daily.    [provider]  terbinafine (LAMISIL) 250 MG tablet Take 250 mg by mouth daily.    [provider]  triamcinolone cream (KENALOG) 0.1 % Apply 1 application topically 2 (two) times daily as needed (psoriasis). 12/19/19   [provider]    Allergies: Patient has no known allergies.    Review of Systems  Constitutional:  Negative for chills and fever.  HENT:  Negative for ear pain and sore throat.   Eyes:  Negative for pain and visual disturbance.  Respiratory:  Negative for cough and shortness of breath.   Cardiovascular:  Negative for chest pain and palpitations.  Gastrointestinal:   Negative for abdominal pain and vomiting.  Genitourinary:  Negative for dysuria and hematuria.  Musculoskeletal:  Negative for arthralgias and back pain.  Skin:  Negative for color change and rash.  Neurological:  Negative for seizures and syncope.  All other systems reviewed and are negative.   Updated Vital Signs BP (!) 179/93 (BP Location: Right Arm)   Pulse 96   Temp 98.7 F (37.1 C) (Oral)   Resp 15   SpO2 93%   Physical Exam Vitals and nursing note reviewed.  Constitutional:      General: She is not in acute distress.    Appearance: She is well-developed.  HENT:     Head: Normocephalic and atraumatic.  Eyes:     Conjunctiva/sclera: Conjunctivae normal.  Cardiovascular:     Rate and Rhythm: Normal rate and regular rhythm.     Heart sounds: No murmur heard. Pulmonary:     Effort: Pulmonary effort is normal. No respiratory distress.     Breath sounds: Normal breath sounds.  Abdominal:     Palpations: Abdomen is soft.     Tenderness: There is no abdominal tenderness.  Musculoskeletal:        General: No swelling.     Cervical back: Neck supple.  Skin:    General: Skin is warm and dry.     Capillary Refill: Capillary refill takes less than 2 seconds.  Neurological:     Mental Status: She is alert.  Psychiatric:        Mood and Affect: Mood normal.     (all labs ordered are listed, but only abnormal results are displayed) Labs Reviewed  URINALYSIS, ROUTINE W REFLEX MICROSCOPIC - Abnormal; Notable for the following components:      Result Value   APPearance HAZY (*)    Hgb urine dipstick TRACE (*)    Protein, ur >300 (*)    Leukocytes,Ua LARGE (*)    Bacteria, UA MANY (*)    All other components within normal limits  CBC WITH DIFFERENTIAL/PLATELET - Abnormal; Notable for the following components:   WBC 13.2 (*)    RBC 5.21 (*)    Hemoglobin 16.1 (*)    HCT 48.6 (*)    Neutro Abs 11.2 (*)    Abs Immature Granulocytes 0.15 (*)    All other components  within normal limits  COMPREHENSIVE METABOLIC PANEL WITH GFR - Abnormal; Notable for the following components:   Glucose, Bld 147 (*)    Creatinine, Ser 1.21 (*)    Calcium 10.8 (*)    GFR, Estimated 43 (*)    All other components within normal limits    EKG: None  Radiology: DG Wrist Complete Left Result Date: 04/01/2024 EXAM: 3 OR MORE VIEW(S) XRAY OF THE LEFT WRIST 04/01/2024 06:30:39 PM COMPARISON: None available. CLINICAL HISTORY: Evaluation for fracture. FINDINGS:  BONES AND JOINTS: Vague cortical irregularity of the ulnar styloid may represent a nondisplaced fracture. Vague lucency along the distal radial metadiaphysis is likely due to overlying soft tissues. Mild degenerative changes of the first [facility] joint. No malalignment. SOFT TISSUES: Dorsal soft tissue edema of the wrist. IMPRESSION: 1. Vague cortical irregularity of the ulnar styloid, possibly representing a nondisplaced fracture. 2. Dorsal soft tissue edema of the wrist. Electronically signed by: Kate Plummer MD 04/01/2024 06:37 PM EST RP Workstation: [id number]   CT Cervical Spine Wo Contrast Result Date: 04/01/2024 EXAM: CT CERVICAL SPINE WITHOUT CONTRAST 04/01/2024 05:48:32 PM TECHNIQUE: CT of the cervical spine was performed without the administration of intravenous contrast. Multiplanar reformatted images are provided for review. Automated exposure control, iterative reconstruction, and/or weight based adjustment of the mA/kV was utilized to reduce the radiation dose to as low as reasonably achievable. COMPARISON: None available. CLINICAL HISTORY: Facial trauma, blunt; fall on thinners. Facial trauma from blunt injury; fall while taking blood thinners. FINDINGS: BONES AND ALIGNMENT: No acute fracture or traumatic malalignment. DEGENERATIVE CHANGES: Multilevel mild-to-moderate degenerative changes of the spine. No associated severe osseous neural foraminal or central canal stenosis. SOFT TISSUES: No prevertebral soft tissue  swelling. Incidentally noted asymmetrically enlarged left thyroid  gland compared to the right. No definite underlying nodule. IMPRESSION: 1. No evidence of acute traumatic injury. 2. Incidentally noted asymmetrically enlarged left thyroid  gland without a definite underlying nodule. No further follow-up indicated. Electronically signed by: Morgane Naveau MD 04/01/2024 06:01 PM EST RP Workstation: [id number]   CT HEAD WO CONTRAST Result Date: 04/01/2024 EXAM: CT HEAD WITHOUT CONTRAST 04/01/2024 05:48:32 PM TECHNIQUE: CT of the head was performed without the administration of intravenous contrast. Automated exposure control, iterative reconstruction, and/or weight based adjustment of the mA/kV was utilized to reduce the radiation dose to as low as reasonably achievable. COMPARISON: Comparison CT head 01/26/2022 CLINICAL HISTORY: Facial trauma from blunt injury; fall while taking Eliquis . FINDINGS: BRAIN AND VENTRICLES: No acute hemorrhage. No evidence of acute infarct. Patchy and confluent decreased attenuation throughout deep and periventricular white matter bilaterally, compatible with chronic microvascular ischemic disease. Cerebral ventricle sizes concordant with cerebral volume loss. No hydrocephalus. No extra-axial collection. No mass effect or midline shift. Atherosclerotic calcifications within cavernous internal carotid and vertebral arteries. ORBITS: Bilateral lens replacement noted. No acute abnormality. SINUSES: No acute abnormality. SOFT TISSUES AND SKULL: No acute soft tissue abnormality. Acute nondisplaced nasal bone fracture. IMPRESSION: 1. Acute nondisplaced nasal bone fractures. 2. No acute intracranial abnormality. Electronically signed by: morgane naveau MD 04/01/2024 05:59 PM EST RP Workstation: [id number]     .Splint Application  Date/Time: 04/01/2024 10:10 PM  Performed by: Simon Lavonia SAILOR, MD Authorized by: Simon Lavonia SAILOR, MD   Consent:    Consent obtained:  Verbal   Consent given  by:  Patient   Risks, benefits, and alternatives were discussed: yes     Risks discussed:  Discoloration and numbness   Alternatives discussed:  Alternative treatment Universal protocol:    Patient identity confirmed:  Verbally with patient Pre-procedure details:    Distal neurologic exam:  Normal   Distal perfusion: distal pulses strong and brisk capillary refill   Procedure details:    Location:  Wrist   Wrist location:  L wrist   Splint type:  Sugar tong   Supplies:  Fiberglass   Attestation: Splint applied and adjusted personally by me   Post-procedure details:    Distal neurologic exam:  Normal   Distal perfusion: distal pulses strong and brisk  capillary refill     Procedure completion:  Tolerated   Post-procedure imaging: not applicable      Medications Ordered in the ED  acetaminophen  (TYLENOL ) tablet 650 mg (650 mg Oral Given 04/01/24 2127)                                    Medical Decision Making Amount and/or Complexity of Data Reviewed Labs: ordered. Radiology: ordered.  Risk OTC drugs. Prescription drug management.     HPI:   Patient presents because of fall.  Fall when walking down the hall.  Unwitnessed.  Patient states that she remembers walking and subsequent felt she was leaning backwards and fell.  Hit her head.  Takes anticoagulation.  No loss consciousness.  Acting at mental baseline.  Endorses some generalized neck pain.  No thoracic or lumbar pain.  No chest pain or shortness of breath.  Otherwise been feeling fine.  Endorsing left wrist pain as well.  Does not follow-up with physical therapy.  Difficulty ambulating at baseline.  Previous medical history reviewed : Patient was last seen in the ED on March 09, 2024.  Fall.  Negative workup at that time  MDM:   Upon examination, patient hemodynamically stable. A&O x 3 with GCS 15.  Patient has small abrasion to the bridge of the nose.  Otherwise acting appropriate.  Obtain CT head CT cervical  spine to assess for an Kuneff acute subdural epidural or fracture.  No thoracic or lumbar pain to palpation.  Pain to palpation left wrist.  Will obtain x-ray to make sure there is no fracture.  No snuffbox tenderness.   Laboratory workup to assess for any kind of large electrolyte derangement given fall.  EKG as well.  Family noted increased urination over the past day or 2 so we will obtain UA to make sure there is no UTI.    Reevaluation:   Upon reexamination, patient hemodynamically stable.  Remains A&O x 3 with GCS 15.    In terms of the left wrist.  Distal ulnar styloid fracture.  Nondisplaced.  Splinted with sugar-tong.  Will follow-up with hand surgeon.  CT head CT cervical spine benign.  No bleed.  No fracture.  Patient did have white blood cell count.  UA consistent for UTI.  Started patient on Keflex.  No concerns for pyelonephritis.  No concerns for nephrolithiasis.  Patient remained stable   Fall consistent for mechanical nature.  No concern for cardiogenic or other syncopal.   I have independently interpreted the XR  and CT  images and agree with the radiologist finding   Social Determinant of Health: lives with daughter    Disposition and Follow Up: hand clinic       Final diagnoses:  Closed nondisplaced fracture of styloid process of left ulna, initial encounter  Fall, initial encounter  UTI (urinary tract infection), bacterial    ED Discharge Orders          Ordered    cephALEXin (KEFLEX) 500 MG capsule  2 times daily        04/01/24 2113               Simon Lavonia SAILOR, MD 04/01/24 2213  "

## 2024-04-01 NOTE — ED Triage Notes (Signed)
 Patient states fall about 30 minutes ago. Headache and nausea since fall. Takes Eliquis . Swelling to forehead.

## 2024-04-12 ENCOUNTER — Other Ambulatory Visit: Payer: Self-pay | Admitting: Cardiology

## 2024-04-12 DIAGNOSIS — I4819 Other persistent atrial fibrillation: Secondary | ICD-10-CM

## 2024-04-13 NOTE — Telephone Encounter (Signed)
 Eliquis  5mg  refill request received. Patient is 89 years old, weight-99kg, Crea-1.21 on 04/01/24, Diagnosis-Afib, and last seen by Dr. Alvan on 01/21/24. Dose is appropriate based on dosing criteria. Will send in refill to requested pharmacy.
